# Patient Record
Sex: Female | Born: 1945 | Race: White | Hispanic: No | Smoking: Never smoker
Health system: Southern US, Community
[De-identification: ages and names within clinical notes are randomized; demographics above are authoritative.]

## PROBLEM LIST (undated history)

## (undated) DIAGNOSIS — Z8719 Personal history of other diseases of the digestive system: Secondary | ICD-10-CM

## (undated) DIAGNOSIS — C801 Malignant (primary) neoplasm, unspecified: Secondary | ICD-10-CM

## (undated) DIAGNOSIS — R011 Cardiac murmur, unspecified: Secondary | ICD-10-CM

## (undated) DIAGNOSIS — F329 Major depressive disorder, single episode, unspecified: Secondary | ICD-10-CM

## (undated) DIAGNOSIS — I10 Essential (primary) hypertension: Secondary | ICD-10-CM

## (undated) DIAGNOSIS — F419 Anxiety disorder, unspecified: Secondary | ICD-10-CM

## (undated) DIAGNOSIS — M1990 Unspecified osteoarthritis, unspecified site: Secondary | ICD-10-CM

## (undated) DIAGNOSIS — F32A Depression, unspecified: Secondary | ICD-10-CM

## (undated) DIAGNOSIS — I890 Lymphedema, not elsewhere classified: Secondary | ICD-10-CM

## (undated) DIAGNOSIS — R609 Edema, unspecified: Secondary | ICD-10-CM

## (undated) DIAGNOSIS — Z22322 Carrier or suspected carrier of Methicillin resistant Staphylococcus aureus: Secondary | ICD-10-CM

## (undated) DIAGNOSIS — K219 Gastro-esophageal reflux disease without esophagitis: Secondary | ICD-10-CM

## (undated) HISTORY — DX: Essential (primary) hypertension: I10

## (undated) HISTORY — PX: JOINT REPLACEMENT: SHX530

## (undated) HISTORY — DX: Anxiety disorder, unspecified: F41.9

## (undated) HISTORY — PX: HERNIA REPAIR: SHX51

## (undated) HISTORY — PX: TUBAL LIGATION: SHX77

## (undated) HISTORY — PX: KIDNEY SURGERY: SHX687

## (undated) HISTORY — PX: BACK SURGERY: SHX140

## (undated) HISTORY — DX: Lymphedema, not elsewhere classified: I89.0

## (undated) HISTORY — PX: CHOLECYSTECTOMY: SHX55

## (undated) HISTORY — PX: TONSILLECTOMY: SUR1361

## (undated) HISTORY — PX: OVARIAN CYST SURGERY: SHX726

## (undated) HISTORY — PX: ABDOMINAL HYSTERECTOMY: SHX81

## (undated) HISTORY — PX: OTHER SURGICAL HISTORY: SHX169

## (undated) HISTORY — PX: COLONOSCOPY: SHX174

## (undated) HISTORY — PX: FRACTURE SURGERY: SHX138

## (undated) HISTORY — PX: APPENDECTOMY: SHX54

---

## 1998-05-07 ENCOUNTER — Ambulatory Visit: Admission: RE | Admit: 1998-05-07 | Discharge: 1998-05-07 | Payer: Self-pay | Admitting: Family Medicine

## 1999-05-06 ENCOUNTER — Encounter: Payer: Self-pay | Admitting: Family Medicine

## 1999-05-06 ENCOUNTER — Ambulatory Visit (HOSPITAL_COMMUNITY): Admission: RE | Admit: 1999-05-06 | Discharge: 1999-05-06 | Payer: Self-pay | Admitting: Family Medicine

## 2000-05-25 ENCOUNTER — Ambulatory Visit (HOSPITAL_COMMUNITY): Admission: RE | Admit: 2000-05-25 | Discharge: 2000-05-25 | Payer: Self-pay | Admitting: Family Medicine

## 2000-05-25 ENCOUNTER — Encounter: Payer: Self-pay | Admitting: Family Medicine

## 2001-05-31 ENCOUNTER — Encounter: Payer: Self-pay | Admitting: Family Medicine

## 2001-05-31 ENCOUNTER — Ambulatory Visit (HOSPITAL_COMMUNITY): Admission: RE | Admit: 2001-05-31 | Discharge: 2001-05-31 | Payer: Self-pay | Admitting: Family Medicine

## 2002-06-09 ENCOUNTER — Ambulatory Visit (HOSPITAL_COMMUNITY): Admission: RE | Admit: 2002-06-09 | Discharge: 2002-06-09 | Payer: Self-pay | Admitting: Family Medicine

## 2002-06-09 ENCOUNTER — Encounter: Payer: Self-pay | Admitting: Family Medicine

## 2002-06-18 ENCOUNTER — Encounter: Payer: Self-pay | Admitting: Family Medicine

## 2002-06-18 ENCOUNTER — Encounter: Admission: RE | Admit: 2002-06-18 | Discharge: 2002-06-18 | Payer: Self-pay | Admitting: Family Medicine

## 2003-05-27 ENCOUNTER — Ambulatory Visit (HOSPITAL_COMMUNITY): Admission: RE | Admit: 2003-05-27 | Discharge: 2003-05-27 | Payer: Self-pay | Admitting: Family Medicine

## 2003-05-27 ENCOUNTER — Encounter: Payer: Self-pay | Admitting: Family Medicine

## 2004-08-24 ENCOUNTER — Ambulatory Visit (HOSPITAL_COMMUNITY): Admission: RE | Admit: 2004-08-24 | Discharge: 2004-08-24 | Payer: Self-pay | Admitting: Family Medicine

## 2004-09-08 ENCOUNTER — Ambulatory Visit: Payer: Self-pay | Admitting: Internal Medicine

## 2005-09-13 ENCOUNTER — Ambulatory Visit (HOSPITAL_COMMUNITY): Admission: RE | Admit: 2005-09-13 | Discharge: 2005-09-13 | Payer: Self-pay | Admitting: Family Medicine

## 2006-09-14 ENCOUNTER — Ambulatory Visit (HOSPITAL_COMMUNITY): Admission: RE | Admit: 2006-09-14 | Discharge: 2006-09-14 | Payer: Self-pay | Admitting: Family Medicine

## 2007-05-11 ENCOUNTER — Inpatient Hospital Stay: Payer: Self-pay | Admitting: Internal Medicine

## 2007-05-11 ENCOUNTER — Ambulatory Visit: Payer: Self-pay | Admitting: Internal Medicine

## 2007-05-11 ENCOUNTER — Other Ambulatory Visit: Payer: Self-pay

## 2007-05-12 ENCOUNTER — Other Ambulatory Visit: Payer: Self-pay

## 2007-09-19 ENCOUNTER — Ambulatory Visit (HOSPITAL_COMMUNITY): Admission: RE | Admit: 2007-09-19 | Discharge: 2007-09-19 | Payer: Self-pay | Admitting: Family Medicine

## 2007-10-04 ENCOUNTER — Encounter: Admission: RE | Admit: 2007-10-04 | Discharge: 2007-10-04 | Payer: Self-pay | Admitting: Family Medicine

## 2008-06-02 ENCOUNTER — Emergency Department: Payer: Self-pay | Admitting: Emergency Medicine

## 2008-10-27 ENCOUNTER — Ambulatory Visit (HOSPITAL_COMMUNITY): Admission: RE | Admit: 2008-10-27 | Discharge: 2008-10-27 | Payer: Self-pay | Admitting: Family Medicine

## 2008-11-04 ENCOUNTER — Encounter: Admission: RE | Admit: 2008-11-04 | Discharge: 2008-11-04 | Payer: Self-pay | Admitting: Family Medicine

## 2009-11-19 ENCOUNTER — Encounter: Admission: RE | Admit: 2009-11-19 | Discharge: 2009-11-19 | Payer: Self-pay | Admitting: Family Medicine

## 2010-10-22 ENCOUNTER — Other Ambulatory Visit: Payer: Self-pay | Admitting: *Deleted

## 2010-10-22 DIAGNOSIS — Z1239 Encounter for other screening for malignant neoplasm of breast: Secondary | ICD-10-CM

## 2010-10-23 ENCOUNTER — Encounter: Payer: Self-pay | Admitting: Family Medicine

## 2010-11-22 ENCOUNTER — Ambulatory Visit
Admission: RE | Admit: 2010-11-22 | Discharge: 2010-11-22 | Disposition: A | Discharge status: Home / Self Care | Payer: 59 | Source: Ambulatory Visit | Attending: *Deleted | Admitting: *Deleted

## 2010-11-22 DIAGNOSIS — Z1239 Encounter for other screening for malignant neoplasm of breast: Secondary | ICD-10-CM

## 2011-05-03 ENCOUNTER — Emergency Department: Payer: Self-pay | Admitting: *Deleted

## 2011-10-16 ENCOUNTER — Other Ambulatory Visit: Payer: Self-pay | Admitting: Family Medicine

## 2011-10-16 DIAGNOSIS — Z1231 Encounter for screening mammogram for malignant neoplasm of breast: Secondary | ICD-10-CM

## 2011-11-24 ENCOUNTER — Ambulatory Visit: Payer: 59

## 2011-12-07 ENCOUNTER — Ambulatory Visit
Admission: RE | Admit: 2011-12-07 | Discharge: 2011-12-07 | Disposition: A | Discharge status: Home / Self Care | Payer: 59 | Source: Ambulatory Visit | Attending: Family Medicine | Admitting: Family Medicine

## 2011-12-07 DIAGNOSIS — Z1231 Encounter for screening mammogram for malignant neoplasm of breast: Secondary | ICD-10-CM

## 2012-05-10 DIAGNOSIS — T148XXA Other injury of unspecified body region, initial encounter: Secondary | ICD-10-CM | POA: Diagnosis not present

## 2012-06-14 DIAGNOSIS — L02219 Cutaneous abscess of trunk, unspecified: Secondary | ICD-10-CM | POA: Diagnosis not present

## 2012-06-18 DIAGNOSIS — I10 Essential (primary) hypertension: Secondary | ICD-10-CM | POA: Diagnosis not present

## 2012-06-18 DIAGNOSIS — L02219 Cutaneous abscess of trunk, unspecified: Secondary | ICD-10-CM | POA: Diagnosis not present

## 2012-06-18 DIAGNOSIS — L03319 Cellulitis of trunk, unspecified: Secondary | ICD-10-CM | POA: Diagnosis not present

## 2012-06-21 ENCOUNTER — Encounter: Payer: Self-pay | Admitting: Nurse Practitioner

## 2012-06-21 ENCOUNTER — Encounter: Payer: Self-pay | Admitting: Cardiothoracic Surgery

## 2012-06-21 DIAGNOSIS — S31809A Unspecified open wound of unspecified buttock, initial encounter: Secondary | ICD-10-CM | POA: Diagnosis not present

## 2012-06-21 DIAGNOSIS — I10 Essential (primary) hypertension: Secondary | ICD-10-CM | POA: Diagnosis not present

## 2012-06-21 DIAGNOSIS — S31109A Unspecified open wound of abdominal wall, unspecified quadrant without penetration into peritoneal cavity, initial encounter: Secondary | ICD-10-CM | POA: Diagnosis not present

## 2012-06-28 DIAGNOSIS — I10 Essential (primary) hypertension: Secondary | ICD-10-CM | POA: Diagnosis not present

## 2012-06-28 DIAGNOSIS — S31109A Unspecified open wound of abdominal wall, unspecified quadrant without penetration into peritoneal cavity, initial encounter: Secondary | ICD-10-CM | POA: Diagnosis not present

## 2012-06-28 DIAGNOSIS — S31809A Unspecified open wound of unspecified buttock, initial encounter: Secondary | ICD-10-CM | POA: Diagnosis not present

## 2012-07-02 ENCOUNTER — Encounter: Payer: Self-pay | Admitting: Nurse Practitioner

## 2012-07-02 ENCOUNTER — Encounter: Payer: Self-pay | Admitting: Cardiothoracic Surgery

## 2012-07-02 DIAGNOSIS — S31109A Unspecified open wound of abdominal wall, unspecified quadrant without penetration into peritoneal cavity, initial encounter: Secondary | ICD-10-CM | POA: Diagnosis not present

## 2012-07-02 DIAGNOSIS — I10 Essential (primary) hypertension: Secondary | ICD-10-CM | POA: Diagnosis not present

## 2012-07-02 DIAGNOSIS — S31809A Unspecified open wound of unspecified buttock, initial encounter: Secondary | ICD-10-CM | POA: Diagnosis not present

## 2012-07-12 DIAGNOSIS — S31809A Unspecified open wound of unspecified buttock, initial encounter: Secondary | ICD-10-CM | POA: Diagnosis not present

## 2012-07-12 DIAGNOSIS — I10 Essential (primary) hypertension: Secondary | ICD-10-CM | POA: Diagnosis not present

## 2012-07-12 DIAGNOSIS — S31109A Unspecified open wound of abdominal wall, unspecified quadrant without penetration into peritoneal cavity, initial encounter: Secondary | ICD-10-CM | POA: Diagnosis not present

## 2012-07-15 DIAGNOSIS — M75100 Unspecified rotator cuff tear or rupture of unspecified shoulder, not specified as traumatic: Secondary | ICD-10-CM | POA: Diagnosis not present

## 2012-07-15 DIAGNOSIS — M75120 Complete rotator cuff tear or rupture of unspecified shoulder, not specified as traumatic: Secondary | ICD-10-CM | POA: Diagnosis not present

## 2012-07-18 ENCOUNTER — Ambulatory Visit: Payer: Self-pay | Admitting: Orthopedic Surgery

## 2012-07-18 LAB — WOUND CULTURE

## 2012-07-23 ENCOUNTER — Ambulatory Visit: Payer: Self-pay | Admitting: Orthopedic Surgery

## 2012-07-23 DIAGNOSIS — M75120 Complete rotator cuff tear or rupture of unspecified shoulder, not specified as traumatic: Secondary | ICD-10-CM | POA: Diagnosis not present

## 2012-07-23 DIAGNOSIS — S46819A Strain of other muscles, fascia and tendons at shoulder and upper arm level, unspecified arm, initial encounter: Secondary | ICD-10-CM | POA: Diagnosis not present

## 2012-07-23 DIAGNOSIS — M67919 Unspecified disorder of synovium and tendon, unspecified shoulder: Secondary | ICD-10-CM | POA: Diagnosis not present

## 2012-07-23 DIAGNOSIS — M75100 Unspecified rotator cuff tear or rupture of unspecified shoulder, not specified as traumatic: Secondary | ICD-10-CM | POA: Diagnosis not present

## 2012-07-24 DIAGNOSIS — I10 Essential (primary) hypertension: Secondary | ICD-10-CM | POA: Diagnosis not present

## 2012-07-26 DIAGNOSIS — S31109A Unspecified open wound of abdominal wall, unspecified quadrant without penetration into peritoneal cavity, initial encounter: Secondary | ICD-10-CM | POA: Diagnosis not present

## 2012-07-26 DIAGNOSIS — I10 Essential (primary) hypertension: Secondary | ICD-10-CM | POA: Diagnosis not present

## 2012-07-26 DIAGNOSIS — S31809A Unspecified open wound of unspecified buttock, initial encounter: Secondary | ICD-10-CM | POA: Diagnosis not present

## 2012-08-02 ENCOUNTER — Encounter: Payer: Self-pay | Admitting: Cardiothoracic Surgery

## 2012-08-02 ENCOUNTER — Encounter: Payer: Self-pay | Admitting: Nurse Practitioner

## 2012-08-02 DIAGNOSIS — I10 Essential (primary) hypertension: Secondary | ICD-10-CM | POA: Diagnosis not present

## 2012-08-02 DIAGNOSIS — S31809A Unspecified open wound of unspecified buttock, initial encounter: Secondary | ICD-10-CM | POA: Diagnosis not present

## 2012-08-02 DIAGNOSIS — S31109A Unspecified open wound of abdominal wall, unspecified quadrant without penetration into peritoneal cavity, initial encounter: Secondary | ICD-10-CM | POA: Diagnosis not present

## 2012-08-05 DIAGNOSIS — M5020 Other cervical disc displacement, unspecified cervical region: Secondary | ICD-10-CM | POA: Diagnosis not present

## 2012-08-05 DIAGNOSIS — M25559 Pain in unspecified hip: Secondary | ICD-10-CM | POA: Diagnosis not present

## 2012-08-05 DIAGNOSIS — M545 Low back pain: Secondary | ICD-10-CM | POA: Diagnosis not present

## 2012-08-09 DIAGNOSIS — S31809A Unspecified open wound of unspecified buttock, initial encounter: Secondary | ICD-10-CM | POA: Diagnosis not present

## 2012-08-09 DIAGNOSIS — S31109A Unspecified open wound of abdominal wall, unspecified quadrant without penetration into peritoneal cavity, initial encounter: Secondary | ICD-10-CM | POA: Diagnosis not present

## 2012-08-09 DIAGNOSIS — I10 Essential (primary) hypertension: Secondary | ICD-10-CM | POA: Diagnosis not present

## 2012-08-23 DIAGNOSIS — I10 Essential (primary) hypertension: Secondary | ICD-10-CM | POA: Diagnosis not present

## 2012-08-23 DIAGNOSIS — S31809A Unspecified open wound of unspecified buttock, initial encounter: Secondary | ICD-10-CM | POA: Diagnosis not present

## 2012-08-23 DIAGNOSIS — S31109A Unspecified open wound of abdominal wall, unspecified quadrant without penetration into peritoneal cavity, initial encounter: Secondary | ICD-10-CM | POA: Diagnosis not present

## 2012-09-19 DIAGNOSIS — R209 Unspecified disturbances of skin sensation: Secondary | ICD-10-CM | POA: Diagnosis not present

## 2012-09-19 DIAGNOSIS — B079 Viral wart, unspecified: Secondary | ICD-10-CM | POA: Diagnosis not present

## 2012-10-02 HISTORY — PX: ROTATOR CUFF REPAIR: SHX139

## 2012-10-23 ENCOUNTER — Ambulatory Visit: Payer: Self-pay | Admitting: Orthopedic Surgery

## 2012-10-23 DIAGNOSIS — Z01812 Encounter for preprocedural laboratory examination: Secondary | ICD-10-CM | POA: Diagnosis not present

## 2012-10-23 DIAGNOSIS — M79609 Pain in unspecified limb: Secondary | ICD-10-CM | POA: Diagnosis not present

## 2012-10-23 DIAGNOSIS — Z01810 Encounter for preprocedural cardiovascular examination: Secondary | ICD-10-CM | POA: Diagnosis not present

## 2012-10-23 DIAGNOSIS — S43429A Sprain of unspecified rotator cuff capsule, initial encounter: Secondary | ICD-10-CM | POA: Diagnosis not present

## 2012-10-23 LAB — PROTIME-INR: Prothrombin Time: 14.4 secs (ref 11.5–14.7)

## 2012-10-23 LAB — BASIC METABOLIC PANEL
Anion Gap: 5 — ABNORMAL LOW (ref 7–16)
Co2: 32 mmol/L (ref 21–32)
Creatinine: 0.81 mg/dL (ref 0.60–1.30)
EGFR (African American): >60
EGFR (Non-African Amer.): >60
Glucose: 101 mg/dL — ABNORMAL HIGH (ref 65–99)
Potassium: 3.9 mmol/L (ref 3.5–5.1)
Sodium: 138 mmol/L (ref 136–145)

## 2012-10-23 LAB — CBC
HCT: 40.6 % (ref 35.0–47.0)
HGB: 13.7 g/dL (ref 12.0–16.0)
MCH: 30.1 pg (ref 26.0–34.0)
MCHC: 33.8 g/dL (ref 32.0–36.0)
Platelet: 227 10*3/uL (ref 150–440)
RBC: 4.55 10*6/uL (ref 3.80–5.20)
WBC: 7.8 10*3/uL (ref 3.6–11.0)

## 2012-10-31 ENCOUNTER — Ambulatory Visit: Payer: Self-pay | Admitting: Orthopedic Surgery

## 2012-10-31 DIAGNOSIS — Z9109 Other allergy status, other than to drugs and biological substances: Secondary | ICD-10-CM | POA: Diagnosis not present

## 2012-10-31 DIAGNOSIS — Z79899 Other long term (current) drug therapy: Secondary | ICD-10-CM | POA: Diagnosis not present

## 2012-10-31 DIAGNOSIS — M1990 Unspecified osteoarthritis, unspecified site: Secondary | ICD-10-CM | POA: Diagnosis not present

## 2012-10-31 DIAGNOSIS — I10 Essential (primary) hypertension: Secondary | ICD-10-CM | POA: Diagnosis not present

## 2012-10-31 DIAGNOSIS — Z85828 Personal history of other malignant neoplasm of skin: Secondary | ICD-10-CM | POA: Diagnosis not present

## 2012-10-31 DIAGNOSIS — E669 Obesity, unspecified: Secondary | ICD-10-CM | POA: Diagnosis not present

## 2012-10-31 DIAGNOSIS — M719 Bursopathy, unspecified: Secondary | ICD-10-CM | POA: Diagnosis not present

## 2012-10-31 DIAGNOSIS — M67919 Unspecified disorder of synovium and tendon, unspecified shoulder: Secondary | ICD-10-CM | POA: Diagnosis not present

## 2012-10-31 DIAGNOSIS — Z91041 Radiographic dye allergy status: Secondary | ICD-10-CM | POA: Diagnosis not present

## 2012-10-31 DIAGNOSIS — M19019 Primary osteoarthritis, unspecified shoulder: Secondary | ICD-10-CM | POA: Diagnosis not present

## 2012-10-31 DIAGNOSIS — M25519 Pain in unspecified shoulder: Secondary | ICD-10-CM | POA: Diagnosis not present

## 2012-10-31 DIAGNOSIS — S43429A Sprain of unspecified rotator cuff capsule, initial encounter: Secondary | ICD-10-CM | POA: Diagnosis not present

## 2012-10-31 DIAGNOSIS — Z885 Allergy status to narcotic agent status: Secondary | ICD-10-CM | POA: Diagnosis not present

## 2012-10-31 DIAGNOSIS — M249 Joint derangement, unspecified: Secondary | ICD-10-CM | POA: Diagnosis not present

## 2012-10-31 DIAGNOSIS — G8918 Other acute postprocedural pain: Secondary | ICD-10-CM | POA: Diagnosis not present

## 2012-10-31 DIAGNOSIS — K219 Gastro-esophageal reflux disease without esophagitis: Secondary | ICD-10-CM | POA: Diagnosis not present

## 2012-10-31 DIAGNOSIS — Z7982 Long term (current) use of aspirin: Secondary | ICD-10-CM | POA: Diagnosis not present

## 2012-12-23 ENCOUNTER — Other Ambulatory Visit (HOSPITAL_COMMUNITY): Payer: Self-pay | Admitting: Family Medicine

## 2012-12-23 ENCOUNTER — Other Ambulatory Visit: Payer: Self-pay

## 2012-12-23 DIAGNOSIS — Z1231 Encounter for screening mammogram for malignant neoplasm of breast: Secondary | ICD-10-CM

## 2013-01-15 ENCOUNTER — Ambulatory Visit
Admission: RE | Admit: 2013-01-15 | Discharge: 2013-01-15 | Disposition: A | Discharge status: Home / Self Care | Payer: 59 | Source: Ambulatory Visit

## 2013-01-15 DIAGNOSIS — Z1231 Encounter for screening mammogram for malignant neoplasm of breast: Secondary | ICD-10-CM

## 2013-05-05 DIAGNOSIS — M79609 Pain in unspecified limb: Secondary | ICD-10-CM | POA: Diagnosis not present

## 2013-05-05 DIAGNOSIS — M7989 Other specified soft tissue disorders: Secondary | ICD-10-CM | POA: Diagnosis not present

## 2013-05-05 DIAGNOSIS — I890 Lymphedema, not elsewhere classified: Secondary | ICD-10-CM | POA: Diagnosis not present

## 2013-06-19 DIAGNOSIS — H2510 Age-related nuclear cataract, unspecified eye: Secondary | ICD-10-CM | POA: Diagnosis not present

## 2013-11-26 DIAGNOSIS — I10 Essential (primary) hypertension: Secondary | ICD-10-CM | POA: Diagnosis not present

## 2013-12-08 ENCOUNTER — Other Ambulatory Visit: Payer: Self-pay

## 2013-12-08 DIAGNOSIS — Z1231 Encounter for screening mammogram for malignant neoplasm of breast: Secondary | ICD-10-CM

## 2014-01-16 ENCOUNTER — Ambulatory Visit
Admission: RE | Admit: 2014-01-16 | Discharge: 2014-01-16 | Disposition: A | Discharge status: Home / Self Care | Payer: 59 | Source: Ambulatory Visit

## 2014-01-16 DIAGNOSIS — Z1231 Encounter for screening mammogram for malignant neoplasm of breast: Secondary | ICD-10-CM

## 2014-01-22 DIAGNOSIS — B079 Viral wart, unspecified: Secondary | ICD-10-CM | POA: Diagnosis not present

## 2014-01-22 DIAGNOSIS — D485 Neoplasm of uncertain behavior of skin: Secondary | ICD-10-CM | POA: Diagnosis not present

## 2014-01-22 DIAGNOSIS — L538 Other specified erythematous conditions: Secondary | ICD-10-CM | POA: Diagnosis not present

## 2014-01-22 DIAGNOSIS — Z85828 Personal history of other malignant neoplasm of skin: Secondary | ICD-10-CM | POA: Diagnosis not present

## 2014-01-22 DIAGNOSIS — L820 Inflamed seborrheic keratosis: Secondary | ICD-10-CM | POA: Diagnosis not present

## 2014-01-22 DIAGNOSIS — D179 Benign lipomatous neoplasm, unspecified: Secondary | ICD-10-CM | POA: Diagnosis not present

## 2014-01-22 DIAGNOSIS — L259 Unspecified contact dermatitis, unspecified cause: Secondary | ICD-10-CM | POA: Diagnosis not present

## 2014-02-17 DIAGNOSIS — L723 Sebaceous cyst: Secondary | ICD-10-CM | POA: Diagnosis not present

## 2014-02-17 DIAGNOSIS — C4441 Basal cell carcinoma of skin of scalp and neck: Secondary | ICD-10-CM | POA: Diagnosis not present

## 2014-03-03 DIAGNOSIS — Z0000 Encounter for general adult medical examination without abnormal findings: Secondary | ICD-10-CM | POA: Diagnosis not present

## 2014-03-03 DIAGNOSIS — E7800 Pure hypercholesterolemia, unspecified: Secondary | ICD-10-CM | POA: Diagnosis not present

## 2014-03-03 DIAGNOSIS — I10 Essential (primary) hypertension: Secondary | ICD-10-CM | POA: Diagnosis not present

## 2014-03-31 ENCOUNTER — Ambulatory Visit: Payer: Self-pay | Admitting: Family Medicine

## 2014-06-16 ENCOUNTER — Ambulatory Visit: Payer: Self-pay | Admitting: Gastroenterology

## 2014-06-16 DIAGNOSIS — Z1211 Encounter for screening for malignant neoplasm of colon: Secondary | ICD-10-CM | POA: Diagnosis not present

## 2014-06-24 DIAGNOSIS — H2510 Age-related nuclear cataract, unspecified eye: Secondary | ICD-10-CM | POA: Diagnosis not present

## 2014-06-30 DIAGNOSIS — Z23 Encounter for immunization: Secondary | ICD-10-CM | POA: Diagnosis not present

## 2014-07-27 DIAGNOSIS — L820 Inflamed seborrheic keratosis: Secondary | ICD-10-CM | POA: Diagnosis not present

## 2014-07-27 DIAGNOSIS — L821 Other seborrheic keratosis: Secondary | ICD-10-CM | POA: Diagnosis not present

## 2014-07-27 DIAGNOSIS — L578 Other skin changes due to chronic exposure to nonionizing radiation: Secondary | ICD-10-CM | POA: Diagnosis not present

## 2014-07-27 DIAGNOSIS — L814 Other melanin hyperpigmentation: Secondary | ICD-10-CM | POA: Diagnosis not present

## 2014-07-27 DIAGNOSIS — Z1283 Encounter for screening for malignant neoplasm of skin: Secondary | ICD-10-CM | POA: Diagnosis not present

## 2014-07-27 DIAGNOSIS — Z85828 Personal history of other malignant neoplasm of skin: Secondary | ICD-10-CM | POA: Diagnosis not present

## 2014-07-27 DIAGNOSIS — D485 Neoplasm of uncertain behavior of skin: Secondary | ICD-10-CM | POA: Diagnosis not present

## 2014-07-27 DIAGNOSIS — D229 Melanocytic nevi, unspecified: Secondary | ICD-10-CM | POA: Diagnosis not present

## 2014-08-04 DIAGNOSIS — Z23 Encounter for immunization: Secondary | ICD-10-CM | POA: Diagnosis not present

## 2014-08-04 DIAGNOSIS — F418 Other specified anxiety disorders: Secondary | ICD-10-CM | POA: Diagnosis not present

## 2014-08-04 DIAGNOSIS — I10 Essential (primary) hypertension: Secondary | ICD-10-CM | POA: Diagnosis not present

## 2014-08-25 DIAGNOSIS — L820 Inflamed seborrheic keratosis: Secondary | ICD-10-CM | POA: Diagnosis not present

## 2014-08-25 DIAGNOSIS — L578 Other skin changes due to chronic exposure to nonionizing radiation: Secondary | ICD-10-CM | POA: Diagnosis not present

## 2014-08-25 DIAGNOSIS — L821 Other seborrheic keratosis: Secondary | ICD-10-CM | POA: Diagnosis not present

## 2014-08-25 DIAGNOSIS — C44719 Basal cell carcinoma of skin of left lower limb, including hip: Secondary | ICD-10-CM | POA: Diagnosis not present

## 2014-12-18 ENCOUNTER — Other Ambulatory Visit: Payer: Self-pay

## 2014-12-18 DIAGNOSIS — Z1231 Encounter for screening mammogram for malignant neoplasm of breast: Secondary | ICD-10-CM

## 2015-01-22 ENCOUNTER — Ambulatory Visit
Admission: RE | Admit: 2015-01-22 | Discharge: 2015-01-22 | Disposition: A | Discharge status: Home / Self Care | Payer: Medicare Other | Source: Ambulatory Visit

## 2015-01-22 DIAGNOSIS — Z1231 Encounter for screening mammogram for malignant neoplasm of breast: Secondary | ICD-10-CM

## 2015-01-27 DIAGNOSIS — L720 Epidermal cyst: Secondary | ICD-10-CM | POA: Diagnosis not present

## 2015-01-27 DIAGNOSIS — D485 Neoplasm of uncertain behavior of skin: Secondary | ICD-10-CM | POA: Diagnosis not present

## 2015-01-27 DIAGNOSIS — L820 Inflamed seborrheic keratosis: Secondary | ICD-10-CM | POA: Diagnosis not present

## 2015-01-27 DIAGNOSIS — L821 Other seborrheic keratosis: Secondary | ICD-10-CM | POA: Diagnosis not present

## 2015-02-16 DIAGNOSIS — J069 Acute upper respiratory infection, unspecified: Secondary | ICD-10-CM | POA: Diagnosis not present

## 2015-02-16 DIAGNOSIS — H109 Unspecified conjunctivitis: Secondary | ICD-10-CM | POA: Diagnosis not present

## 2015-03-05 ENCOUNTER — Other Ambulatory Visit: Payer: Self-pay | Admitting: Family Medicine

## 2015-03-05 DIAGNOSIS — I10 Essential (primary) hypertension: Secondary | ICD-10-CM

## 2015-03-08 ENCOUNTER — Encounter: Payer: Self-pay | Admitting: Family Medicine

## 2015-03-29 ENCOUNTER — Ambulatory Visit (INDEPENDENT_AMBULATORY_CARE_PROVIDER_SITE_OTHER): Payer: Medicare Other | Admitting: Family Medicine

## 2015-03-29 ENCOUNTER — Encounter: Payer: Self-pay | Admitting: Family Medicine

## 2015-03-29 VITALS — BP 138/85 | HR 80 | Temp 98.2°F | Ht 62.2 in | Wt 268.0 lb

## 2015-03-29 DIAGNOSIS — Z6841 Body Mass Index (BMI) 40.0 and over, adult: Secondary | ICD-10-CM

## 2015-03-29 DIAGNOSIS — M79642 Pain in left hand: Secondary | ICD-10-CM | POA: Diagnosis not present

## 2015-03-29 DIAGNOSIS — E7800 Pure hypercholesterolemia, unspecified: Secondary | ICD-10-CM

## 2015-03-29 DIAGNOSIS — M79641 Pain in right hand: Secondary | ICD-10-CM

## 2015-03-29 DIAGNOSIS — I10 Essential (primary) hypertension: Secondary | ICD-10-CM

## 2015-03-29 DIAGNOSIS — Z0000 Encounter for general adult medical examination without abnormal findings: Secondary | ICD-10-CM | POA: Diagnosis not present

## 2015-03-29 DIAGNOSIS — R350 Frequency of micturition: Secondary | ICD-10-CM

## 2015-03-29 DIAGNOSIS — M25541 Pain in joints of right hand: Secondary | ICD-10-CM

## 2015-03-29 DIAGNOSIS — M25542 Pain in joints of left hand: Secondary | ICD-10-CM

## 2015-03-29 LAB — URINALYSIS, ROUTINE W REFLEX MICROSCOPIC
BILIRUBIN UA: NEGATIVE
Glucose, UA: NEGATIVE
Ketones, UA: NEGATIVE
Nitrite, UA: NEGATIVE
PROTEIN UA: NEGATIVE
SPEC GRAV UA: 1.02 (ref 1.005–1.030)
Urobilinogen, Ur: 0.2 mg/dL (ref 0.2–1.0)
pH, UA: 5.5 (ref 5.0–7.5)

## 2015-03-29 LAB — MICROSCOPIC EXAMINATION

## 2015-03-29 MED ORDER — SULFAMETHOXAZOLE-TRIMETHOPRIM 400-80 MG PO TABS: 1.0000 | ORAL_TABLET | Freq: Two times a day (BID) | ORAL | Status: DC

## 2015-03-29 MED ORDER — OLMESARTAN-AMLODIPINE-HCTZ 40-5-25 MG PO TABS: 1.0000 | ORAL_TABLET | Freq: Every day | ORAL | Status: DC

## 2015-03-29 MED ORDER — TRAZODONE HCL 50 MG PO TABS: 50.0000 mg | ORAL_TABLET | Freq: Every day | ORAL | Status: DC

## 2015-03-29 MED ORDER — MELOXICAM 15 MG PO TABS: 15.0000 mg | ORAL_TABLET | Freq: Every day | ORAL | Status: DC | PRN

## 2015-03-29 MED ORDER — TRAMADOL HCL 50 MG PO TABS: 50.0000 mg | ORAL_TABLET | Freq: Four times a day (QID) | ORAL | Status: DC | PRN

## 2015-03-29 MED ORDER — OMEPRAZOLE 40 MG PO CPDR: 40.0000 mg | DELAYED_RELEASE_CAPSULE | Freq: Every day | ORAL | Status: DC

## 2015-03-29 MED ORDER — LORAZEPAM 1 MG PO TABS: 1.0000 mg | ORAL_TABLET | Freq: Every day | ORAL | Status: DC | PRN

## 2015-03-29 NOTE — Assessment & Plan Note (Signed)
The current medical regimen is effective;  continue present plan and medications.  

## 2015-03-29 NOTE — Assessment & Plan Note (Signed)
discussed use of tramadol and care

## 2015-03-29 NOTE — Progress Notes (Signed)
BP 138/85 mmHg  Pulse 80  Temp(Src) 98.2 F (36.8 C)  Ht 5' 2.2" (1.58 m)  Wt 268 lb (121.564 kg)  BMI 48.70 kg/m2  SpO2 97%   Subjective:    Patient ID: Tracy Espinoza, female    DOB: May 23, 1946, 69 y.o.   MRN: [id number]  HPI: Tracy Espinoza is a 69 y.o. female  Chief Complaint  Patient presents with  . Annual Exam  . Urinary Tract Infection   Took cipro but still sx of UTI   Relevant past medical, surgical, family and social history reviewed and updated as indicated. Interim medical history since our last visit reviewed. Allergies and medications reviewed and updated. BP doing well Has retired No problems with meds Takes every day Lt leg still swelloing  Review of Systems  Constitutional: Negative.   HENT: Negative.   Eyes: Negative.   Respiratory: Negative.   Cardiovascular: Negative.   Gastrointestinal: Negative.   Endocrine: Negative.   Genitourinary: Negative.   Musculoskeletal: Negative.   Skin: Negative.   Allergic/Immunologic: Negative.   Neurological: Negative.   Hematological: Negative.   Psychiatric/Behavioral: Negative.     Per HPI unless specifically indicated above     Objective:    BP 138/85 mmHg  Pulse 80  Temp(Src) 98.2 F (36.8 C)  Ht 5' 2.2" (1.58 m)  Wt 268 lb (121.564 kg)  BMI 48.70 kg/m2  SpO2 97%  Wt Readings from Last 3 Encounters:  03/29/15 268 lb (121.564 kg)  02/16/15 266 lb (120.657 kg)  08/04/14 254 lb (115.214 kg)    Physical Exam  Constitutional: She is oriented to person, place, and time. She appears well-developed and well-nourished.  HENT:  Head: Normocephalic and atraumatic.  Right Ear: External ear normal.  Left Ear: External ear normal.  Nose: Nose normal.  Mouth/Throat: Oropharynx is clear and moist.  Eyes: Conjunctivae and EOM are normal. Pupils are equal, round, and reactive to light.  Neck: Normal range of motion. Neck supple. Carotid bruit is not present.  Cardiovascular: Normal rate, regular  rhythm and normal heart sounds.   No murmur heard. Pulmonary/Chest: Effort normal and breath sounds normal.  Abdominal: Soft. Bowel sounds are normal. There is no hepatosplenomegaly.  Musculoskeletal: Normal range of motion.  Neurological: She is alert and oriented to person, place, and time.  Skin: No rash noted.  Psychiatric: She has a normal mood and affect. Her behavior is normal. Judgment and thought content normal.        Assessment & Plan:   Problem List Items Addressed This Visit      Cardiovascular and Mediastinum   Hypertension    The current medical regimen is effective;  continue present plan and medications.       Relevant Medications   Olmesartan-Amlodipine-HCTZ (TRIBENZOR) 40-5-25 MG TABS   Other Relevant Orders   TSH     Other   Hypercholesteremia    The current medical regimen is effective;  continue present plan and medications.       Relevant Medications   Olmesartan-Amlodipine-HCTZ (TRIBENZOR) 40-5-25 MG TABS   Other Relevant Orders   Lipid panel   BMI 45.0-49.9, adult    Discuss diet and exercise  Wt loss      Arthralgia of hands, bilateral    discussed use of tramadol and care      Relevant Medications   traMADol (ULTRAM) 50 MG tablet    Other Visit Diagnoses    Frequency of urination    -  Primary  Relevant Orders    Comprehensive metabolic panel    CBC with Differential/Platelet    Urinalysis, Routine w reflex microscopic (not at [facility])    PE (physical exam), annual        Relevant Orders    Comprehensive metabolic panel    CBC with Differential/Platelet    Urinalysis, Routine w reflex microscopic (not at [facility])    TSH    Lipid panel    Benign hypertension        Relevant Medications    Olmesartan-Amlodipine-HCTZ (TRIBENZOR) 40-5-25 MG TABS        Follow up plan: Return in about 6 months (around 09/28/2015) for bmp BP check.

## 2015-03-29 NOTE — Assessment & Plan Note (Signed)
Discuss diet and exercise  Wt loss

## 2015-03-30 LAB — LIPID PANEL
Chol/HDL Ratio: 3.6 ratio units (ref 0.0–4.4)
Cholesterol, Total: 199 mg/dL (ref 100–199)
HDL: 56 mg/dL (ref >39)
LDL Calculated: 99 mg/dL (ref 0–99)
Triglycerides: 219 mg/dL — ABNORMAL HIGH (ref 0–149)
VLDL Cholesterol Cal: 44 mg/dL — ABNORMAL HIGH (ref 5–40)

## 2015-03-30 LAB — COMPREHENSIVE METABOLIC PANEL
A/G RATIO: 1.7 (ref 1.1–2.5)
ALK PHOS: 46 IU/L (ref 39–117)
ALT: 21 IU/L (ref 0–32)
AST: 21 IU/L (ref 0–40)
Albumin: 4.4 g/dL (ref 3.5–4.8)
BILIRUBIN TOTAL: 0.6 mg/dL (ref 0.0–1.2)
BUN/Creatinine Ratio: 37 — ABNORMAL HIGH (ref 11–26)
BUN: 29 mg/dL — ABNORMAL HIGH (ref 8–27)
CO2: 24 mmol/L (ref 18–29)
Calcium: 9.9 mg/dL (ref 8.7–10.3)
Chloride: 98 mmol/L (ref 97–108)
Creatinine, Ser: 0.79 mg/dL (ref 0.57–1.00)
GFR calc Af Amer: 88 mL/min/{1.73_m2} (ref >59)
GFR, EST NON AFRICAN AMERICAN: 76 mL/min/{1.73_m2} (ref >59)
GLOBULIN, TOTAL: 2.6 g/dL (ref 1.5–4.5)
GLUCOSE: 106 mg/dL — AB (ref 65–99)
POTASSIUM: 4.9 mmol/L (ref 3.5–5.2)
SODIUM: 144 mmol/L (ref 134–144)
Total Protein: 7 g/dL (ref 6.0–8.5)

## 2015-03-30 LAB — CBC WITH DIFFERENTIAL/PLATELET
BASOS: 0 %
Basophils Absolute: 0 10*3/uL (ref 0.0–0.2)
EOS (ABSOLUTE): 0.1 10*3/uL (ref 0.0–0.4)
EOS: 1 %
Hematocrit: 43.3 % (ref 34.0–46.6)
Hemoglobin: 13.9 g/dL (ref 11.1–15.9)
Immature Grans (Abs): 0 10*3/uL (ref 0.0–0.1)
Immature Granulocytes: 0 %
LYMPHS ABS: 3.8 10*3/uL — AB (ref 0.7–3.1)
Lymphs: 36 %
MCH: 30.2 pg (ref 26.6–33.0)
MCHC: 32.1 g/dL (ref 31.5–35.7)
MCV: 94 fL (ref 79–97)
MONOCYTES: 5 %
Monocytes Absolute: 0.6 10*3/uL (ref 0.1–0.9)
NEUTROS ABS: 6 10*3/uL (ref 1.4–7.0)
Neutrophils: 58 %
Platelets: 210 10*3/uL (ref 150–379)
RBC: 4.6 x10E6/uL (ref 3.77–5.28)
RDW: 13.5 % (ref 12.3–15.4)
WBC: 10.5 10*3/uL (ref 3.4–10.8)

## 2015-03-30 LAB — TSH: TSH: 1.79 u[IU]/mL (ref 0.450–4.500)

## 2015-04-02 ENCOUNTER — Telehealth: Payer: Self-pay | Admitting: Family Medicine

## 2015-04-02 ENCOUNTER — Other Ambulatory Visit: Payer: Medicare Other

## 2015-04-02 ENCOUNTER — Other Ambulatory Visit: Payer: Self-pay

## 2015-04-02 ENCOUNTER — Telehealth: Payer: Self-pay

## 2015-04-02 ENCOUNTER — Other Ambulatory Visit: Payer: Self-pay | Admitting: Family Medicine

## 2015-04-02 DIAGNOSIS — N390 Urinary tract infection, site not specified: Secondary | ICD-10-CM | POA: Diagnosis not present

## 2015-04-02 DIAGNOSIS — R8281 Pyuria: Secondary | ICD-10-CM

## 2015-04-02 LAB — UA/M W/RFLX CULTURE, ROUTINE
Bilirubin, UA: NEGATIVE
Glucose, UA: NEGATIVE
KETONES UA: NEGATIVE
Leukocytes, UA: NEGATIVE
NITRITE UA: NEGATIVE
PH UA: 5.5 (ref 5.0–7.5)
Protein, UA: NEGATIVE
RBC, UA: NEGATIVE
Specific Gravity, UA: 1.02 (ref 1.005–1.030)
UUROB: 0.2 mg/dL (ref 0.2–1.0)

## 2015-04-02 NOTE — Telephone Encounter (Signed)
Patient called inquiring about urine culture results.  Culture was not run for unknown reason.  Patient is still having symptoms. Per MJ , patient coming in to leave another sample for culture.

## 2015-04-02 NOTE — Telephone Encounter (Signed)
Please let her know that her urine came back entirely normal. No blood. No infection. Nothing to worry about at this time.

## 2015-04-02 NOTE — Telephone Encounter (Signed)
Patient called, left vm that test was clear, to finish meds.  If Sx persist to next week call and schedule appointment as it may be yeast

## 2015-06-28 ENCOUNTER — Encounter: Payer: Self-pay | Admitting: Family Medicine

## 2015-06-28 ENCOUNTER — Ambulatory Visit (INDEPENDENT_AMBULATORY_CARE_PROVIDER_SITE_OTHER): Payer: Medicare Other | Admitting: Family Medicine

## 2015-06-28 VITALS — BP 127/83 | HR 82 | Temp 97.9°F | Wt 273.0 lb

## 2015-06-28 DIAGNOSIS — R300 Dysuria: Secondary | ICD-10-CM | POA: Diagnosis not present

## 2015-06-28 DIAGNOSIS — N3000 Acute cystitis without hematuria: Secondary | ICD-10-CM | POA: Diagnosis not present

## 2015-06-28 LAB — MICROSCOPIC EXAMINATION: Renal Epithel, UA: NONE SEEN /hpf

## 2015-06-28 MED ORDER — CIPROFLOXACIN HCL 500 MG PO TABS: 500.0000 mg | ORAL_TABLET | Freq: Two times a day (BID) | ORAL | Status: DC

## 2015-06-28 NOTE — Patient Instructions (Signed)

## 2015-06-28 NOTE — Progress Notes (Signed)
BP 127/83 mmHg  Pulse 82  Temp(Src) 97.9 F (36.6 C)  Wt 273 lb (123.832 kg)  SpO2 95%  PF 5 L/min   Subjective:    Patient ID: Tracy Espinoza, female    DOB: [birth year], 69 y.o.   MRN: [id number]  HPI: Tracy Espinoza is a 69 y.o. female  Chief Complaint  Patient presents with  . Urinary Tract Infection   URINARY SYMPTOMS- seems to have cold sweats when she gets UTI Duration: about 5 weeks Dysuria: no Urinary frequency: yes Urgency: yes Small volume voids: no Symptom severity: mild Urinary incontinence: yes Foul odor: no Hematuria: no Abdominal pain: no Back pain: no Suprapubic pain/pressure: yes Flank pain: no Fever:  no Vomiting: no Relief with cranberry juice: yes Relief with pyridium: no Status: worse Previous urinary tract infection: yes Recurrent urinary tract infection: yes Treatments attempted: cranberry and increasing fluids   Relevant past medical, surgical, family and social history reviewed and updated as indicated. Interim medical history since our last visit reviewed. Allergies and medications reviewed and updated.  Review of Systems  Constitutional: Negative.   Respiratory: Negative.   Cardiovascular: Negative.   Gastrointestinal: Negative.   Genitourinary: Negative.   Psychiatric/Behavioral: Negative.     Per HPI unless specifically indicated above     Objective:    BP 127/83 mmHg  Pulse 82  Temp(Src) 97.9 F (36.6 C)  Wt 273 lb (123.832 kg)  SpO2 95%  PF 5 L/min  Wt Readings from Last 3 Encounters:  06/28/15 273 lb (123.832 kg)  03/29/15 268 lb (121.564 kg)  02/16/15 266 lb (120.657 kg)    Physical Exam  Constitutional: She is oriented to person, place, and time. She appears well-developed and well-nourished. No distress.  HENT:  Head: Normocephalic and atraumatic.  Right Ear: Hearing normal.  Left Ear: Hearing normal.  Nose: Nose normal.  Eyes: Conjunctivae and lids are normal. Right eye exhibits no discharge. Left eye  exhibits no discharge. No scleral icterus.  Cardiovascular: Normal rate, regular rhythm, normal heart sounds and intact distal pulses.  Exam reveals no gallop and no friction rub.   No murmur heard. Pulmonary/Chest: Effort normal and breath sounds normal. No respiratory distress. She has no wheezes. She has no rales. She exhibits no tenderness.  Abdominal: Soft. Bowel sounds are normal. She exhibits no distension and no mass. There is no tenderness. There is no rebound and no guarding.  Musculoskeletal: Normal range of motion.  Neurological: She is alert and oriented to person, place, and time.  Skin: Skin is warm, dry and intact. No rash noted. No erythema. No pallor.  Psychiatric: She has a normal mood and affect. Her speech is normal and behavior is normal. Judgment and thought content normal. Cognition and memory are normal.  Nursing note and vitals reviewed.   Results for orders placed or performed in visit on 04/02/15  UA/M w/rflx Culture, Routine  Result Value Ref Range   Specific Gravity, UA 1.020 1.005 - 1.030   pH, UA 5.5 5.0 - 7.5   Color, UA Yellow Yellow   Appearance Ur Clear Clear   Leukocytes, UA Negative Negative   Protein, UA Negative Negative/Trace   Glucose, UA Negative Negative   Ketones, UA Negative Negative   RBC, UA Negative Negative   Bilirubin, UA Negative Negative   Urobilinogen, Ur 0.2 0.2 - 1.0 mg/dL   Nitrite, UA Negative Negative      Assessment & Plan:   Problem List Items Addressed This Visit  None    Visit Diagnoses    Acute cystitis without hematuria    -  Primary    UTI. Will treat x 7 days with cipro. Rx sent to her pharmacy. Will let us know if not getting better or getting worse.     Dysuria        Will check UA    Relevant Orders    UA/M w/rflx Culture, Routine        Follow up plan: Return if symptoms worsen or fail to improve.

## 2015-07-02 LAB — URINE CULTURE, REFLEX

## 2015-07-05 ENCOUNTER — Encounter: Payer: Self-pay | Admitting: Family Medicine

## 2015-07-05 LAB — UA/M W/RFLX CULTURE, ROUTINE

## 2015-07-22 DIAGNOSIS — Z23 Encounter for immunization: Secondary | ICD-10-CM | POA: Diagnosis not present

## 2015-07-28 DIAGNOSIS — H2513 Age-related nuclear cataract, bilateral: Secondary | ICD-10-CM | POA: Diagnosis not present

## 2015-07-29 DIAGNOSIS — D229 Melanocytic nevi, unspecified: Secondary | ICD-10-CM | POA: Diagnosis not present

## 2015-07-29 DIAGNOSIS — L720 Epidermal cyst: Secondary | ICD-10-CM | POA: Diagnosis not present

## 2015-07-29 DIAGNOSIS — D1800 Hemangioma unspecified site: Secondary | ICD-10-CM | POA: Diagnosis not present

## 2015-07-29 DIAGNOSIS — L578 Other skin changes due to chronic exposure to nonionizing radiation: Secondary | ICD-10-CM | POA: Diagnosis not present

## 2015-07-29 DIAGNOSIS — L820 Inflamed seborrheic keratosis: Secondary | ICD-10-CM | POA: Diagnosis not present

## 2015-07-29 DIAGNOSIS — L821 Other seborrheic keratosis: Secondary | ICD-10-CM | POA: Diagnosis not present

## 2015-07-29 DIAGNOSIS — D485 Neoplasm of uncertain behavior of skin: Secondary | ICD-10-CM | POA: Diagnosis not present

## 2015-07-29 DIAGNOSIS — L812 Freckles: Secondary | ICD-10-CM | POA: Diagnosis not present

## 2015-07-29 DIAGNOSIS — Z85828 Personal history of other malignant neoplasm of skin: Secondary | ICD-10-CM | POA: Diagnosis not present

## 2015-07-29 DIAGNOSIS — D171 Benign lipomatous neoplasm of skin and subcutaneous tissue of trunk: Secondary | ICD-10-CM | POA: Diagnosis not present

## 2015-07-29 DIAGNOSIS — L304 Erythema intertrigo: Secondary | ICD-10-CM | POA: Diagnosis not present

## 2015-07-29 DIAGNOSIS — Z1283 Encounter for screening for malignant neoplasm of skin: Secondary | ICD-10-CM | POA: Diagnosis not present

## 2015-08-06 ENCOUNTER — Encounter: Payer: Self-pay | Admitting: Unknown Physician Specialty

## 2015-08-06 ENCOUNTER — Ambulatory Visit (INDEPENDENT_AMBULATORY_CARE_PROVIDER_SITE_OTHER): Payer: Medicare Other | Admitting: Unknown Physician Specialty

## 2015-08-06 VITALS — BP 110/74 | HR 77 | Temp 97.4°F | Wt 282.6 lb

## 2015-08-06 DIAGNOSIS — N3090 Cystitis, unspecified without hematuria: Secondary | ICD-10-CM

## 2015-08-06 DIAGNOSIS — R110 Nausea: Secondary | ICD-10-CM

## 2015-08-06 DIAGNOSIS — R8290 Unspecified abnormal findings in urine: Secondary | ICD-10-CM | POA: Diagnosis not present

## 2015-08-06 LAB — MICROSCOPIC EXAMINATION
Epithelial Cells (non renal): NONE SEEN /hpf (ref 0–10)
WBC, UA: >30 /hpf — ABNORMAL HIGH (ref 0–?)

## 2015-08-06 MED ORDER — CIPROFLOXACIN HCL 250 MG PO TABS: 250.0000 mg | ORAL_TABLET | Freq: Two times a day (BID) | ORAL | Status: DC

## 2015-08-06 NOTE — Progress Notes (Signed)
   BP 110/74 mmHg  Pulse 77  Temp(Src) 97.4 F (36.3 C)  Wt 282 lb 9.6 oz (128.187 kg)  SpO2 97%   Subjective:    Patient ID: Tracy Espinoza, female    DOB: Oct 21, 1945, 69 y.o.   MRN: [id number]  HPI: Tracy Espinoza is a 69 y.o. female  Chief Complaint  Patient presents with  . Urinary Tract Infection    pt states she has taken test from pharmacy and it came back positive. States she does not feel good and is nauseated  . Hip Pain    pt states she is having pain in right hip   Pt has not UTI symptoms, but she does have a history of having a UTI when she feels bad.  I note a significant history of UTI's and positive urines  Relevant past medical, surgical, family and social history reviewed and updated as indicated. Interim medical history since our last visit reviewed. Allergies and medications reviewed and updated.  Review of Systems  Per HPI unless specifically indicated above     Objective:    BP 110/74 mmHg  Pulse 77  Temp(Src) 97.4 F (36.3 C)  Wt 282 lb 9.6 oz (128.187 kg)  SpO2 97%  Wt Readings from Last 3 Encounters:  08/06/15 282 lb 9.6 oz (128.187 kg)  06/28/15 273 lb (123.832 kg)  03/29/15 268 lb (121.564 kg)    Physical Exam  Constitutional: She is oriented to person, place, and time. She appears well-developed and well-nourished. No distress.  HENT:  Head: Normocephalic and atraumatic.  Eyes: Conjunctivae and lids are normal. Right eye exhibits no discharge. Left eye exhibits no discharge. No scleral icterus.  Cardiovascular: Normal rate, regular rhythm and normal heart sounds.   Pulmonary/Chest: Effort normal and breath sounds normal. No respiratory distress.  Abdominal: Normal appearance. There is no splenomegaly or hepatomegaly.  Musculoskeletal: Normal range of motion.  Neurological: She is alert and oriented to person, place, and time.  Skin: Skin is warm, dry and intact. No rash noted. No erythema. No pallor.  Psychiatric: She has a normal mood  and affect. Her behavior is normal. Judgment and thought content normal.   Urine is positive     Assessment & Plan:   Problem List Items Addressed This Visit    None    Visit Diagnoses    Nausea    -  Primary    Relevant Orders    UA/M w/rflx Culture, Routine    Cystitis            Follow up plan: Return for evaluation of hip.

## 2015-08-06 NOTE — Patient Instructions (Signed)

## 2015-08-09 LAB — UA/M W/RFLX CULTURE, ROUTINE

## 2015-08-09 LAB — URINE CULTURE, REFLEX

## 2015-08-13 ENCOUNTER — Ambulatory Visit (INDEPENDENT_AMBULATORY_CARE_PROVIDER_SITE_OTHER): Payer: Medicare Other | Admitting: Unknown Physician Specialty

## 2015-08-13 ENCOUNTER — Encounter: Payer: Self-pay | Admitting: Unknown Physician Specialty

## 2015-08-13 VITALS — BP 125/81 | HR 76 | Temp 97.5°F | Ht 62.0 in | Wt 280.2 lb

## 2015-08-13 DIAGNOSIS — M7071 Other bursitis of hip, right hip: Secondary | ICD-10-CM | POA: Diagnosis not present

## 2015-08-13 DIAGNOSIS — M25541 Pain in joints of right hand: Secondary | ICD-10-CM | POA: Diagnosis not present

## 2015-08-13 DIAGNOSIS — M25542 Pain in joints of left hand: Secondary | ICD-10-CM

## 2015-08-13 DIAGNOSIS — M7918 Myalgia, other site: Secondary | ICD-10-CM

## 2015-08-13 DIAGNOSIS — M791 Myalgia: Secondary | ICD-10-CM

## 2015-08-13 MED ORDER — TRAMADOL HCL 50 MG PO TABS: 50.0000 mg | ORAL_TABLET | Freq: Four times a day (QID) | ORAL | Status: DC | PRN

## 2015-08-13 NOTE — Progress Notes (Signed)
BP 125/81 mmHg  Pulse 76  Temp(Src) 97.5 F (36.4 C)  Ht 5\' 2"  (1.575 m)  Wt 280 lb 3.2 oz (127.098 kg)  BMI 51.24 kg/m2  SpO2 97%   Subjective:    Patient ID: Tracy Espinoza, female    DOB: [birth year], 69 y.o.   MRN: [id number]  HPI: Tracy Espinoza is a 69 y.o. female  Chief Complaint  Patient presents with  . Hip Pain    pt states she is having pain in right hip. States it started about a month ago.   Hip Pain  Incident onset: about a month.  No particular incident. There was no injury mechanism. The pain is present in the right hip. The quality of the pain is described as stabbing. The pain is severe. Pain course: worse with standing and walking and lying down makes it go away completely. Pertinent negatives include no loss of motion, loss of sensation, muscle weakness, numbness or tingling. The symptoms are aggravated by movement and weight bearing. She has tried rest and acetaminophen (Tramadol) for the symptoms. The treatment provided moderate (Moderate from Tramadol.  Mild from Tylenol) relief.  She does go to an exercise and balance class.    Relevant past medical, surgical, family and social history reviewed and updated as indicated. Interim medical history since our last visit reviewed. Allergies and medications reviewed and updated.  Review of Systems  Neurological: Negative for tingling and numbness.    Per HPI unless specifically indicated above     Objective:    BP 125/81 mmHg  Pulse 76  Temp(Src) 97.5 F (36.4 C)  Ht 5\' 2"  (1.575 m)  Wt 280 lb 3.2 oz (127.098 kg)  BMI 51.24 kg/m2  SpO2 97%  Wt Readings from Last 3 Encounters:  08/13/15 280 lb 3.2 oz (127.098 kg)  08/06/15 282 lb 9.6 oz (128.187 kg)  06/28/15 273 lb (123.832 kg)    Physical Exam  Constitutional: She is oriented to person, place, and time. She appears well-developed and well-nourished. No distress.  HENT:  Head: Normocephalic and atraumatic.  Eyes: Conjunctivae and lids are normal.  Right eye exhibits no discharge. Left eye exhibits no discharge. No scleral icterus.  Cardiovascular: Normal rate and regular rhythm.   Pulmonary/Chest: Effort normal. No respiratory distress.  Abdominal: Normal appearance and bowel sounds are normal. She exhibits no distension. There is no splenomegaly or hepatomegaly. There is no tenderness.  Musculoskeletal:  Tender Piriformis and right greater trochanter.    Neurological: She is alert and oriented to person, place, and time.  Skin: Skin is intact. No rash noted. No pallor.  Psychiatric: She has a normal mood and affect. Her behavior is normal. Judgment and thought content normal.      Assessment & Plan:   Problem List Items Addressed This Visit      Unprioritized   Arthralgia of hands, bilateral   Relevant Medications   traMADol (ULTRAM) 50 MG tablet    Other Visit Diagnoses    Piriformis muscle pain    -  Primary    discuused PT.  She would like to work on her home exercises she had in the past.  refer to Dr Wynetta Emery    Relevant Orders    Ambulatory referral to Orthopedic Surgery    Hip bursitis, right        Offered injection.  Pt would prefer an Orthopedic appointment.  Will refer to Dr. Marry Guan.      Relevant Orders  Ambulatory referral to Orthopedic Surgery        Follow up plan: Return for Appt with Dr. Wynetta Emery for manipulation.

## 2015-08-31 ENCOUNTER — Ambulatory Visit (INDEPENDENT_AMBULATORY_CARE_PROVIDER_SITE_OTHER): Payer: Medicare Other | Admitting: Family Medicine

## 2015-08-31 ENCOUNTER — Encounter: Payer: Self-pay | Admitting: Family Medicine

## 2015-08-31 VITALS — BP 136/74 | HR 85 | Temp 98.8°F | Ht 62.0 in | Wt 281.0 lb

## 2015-08-31 DIAGNOSIS — E7800 Pure hypercholesterolemia, unspecified: Secondary | ICD-10-CM

## 2015-08-31 DIAGNOSIS — R109 Unspecified abdominal pain: Secondary | ICD-10-CM

## 2015-08-31 DIAGNOSIS — M25542 Pain in joints of left hand: Secondary | ICD-10-CM | POA: Diagnosis not present

## 2015-08-31 DIAGNOSIS — N3000 Acute cystitis without hematuria: Secondary | ICD-10-CM | POA: Diagnosis not present

## 2015-08-31 DIAGNOSIS — M25541 Pain in joints of right hand: Secondary | ICD-10-CM

## 2015-08-31 DIAGNOSIS — I10 Essential (primary) hypertension: Secondary | ICD-10-CM | POA: Diagnosis not present

## 2015-08-31 DIAGNOSIS — N390 Urinary tract infection, site not specified: Secondary | ICD-10-CM | POA: Insufficient documentation

## 2015-08-31 LAB — URINALYSIS, ROUTINE W REFLEX MICROSCOPIC
BILIRUBIN UA: NEGATIVE
GLUCOSE, UA: NEGATIVE
Ketones, UA: NEGATIVE
NITRITE UA: NEGATIVE
PH UA: 5.5 (ref 5.0–7.5)
PROTEIN UA: NEGATIVE
Specific Gravity, UA: 1.015 (ref 1.005–1.030)
UUROB: 0.2 mg/dL (ref 0.2–1.0)

## 2015-08-31 LAB — MICROSCOPIC EXAMINATION

## 2015-08-31 MED ORDER — LORAZEPAM 1 MG PO TABS: 1.0000 mg | ORAL_TABLET | Freq: Every day | ORAL | Status: DC | PRN

## 2015-08-31 MED ORDER — AMLODIPINE BESYLATE 5 MG PO TABS: 5.0000 mg | ORAL_TABLET | Freq: Every day | ORAL | Status: DC

## 2015-08-31 MED ORDER — AMOXICILLIN-POT CLAVULANATE 875-125 MG PO TABS: 1.0000 | ORAL_TABLET | Freq: Two times a day (BID) | ORAL | Status: DC

## 2015-08-31 MED ORDER — TRAMADOL HCL 50 MG PO TABS: 50.0000 mg | ORAL_TABLET | Freq: Four times a day (QID) | ORAL | Status: DC | PRN

## 2015-08-31 MED ORDER — HYDROCHLOROTHIAZIDE 25 MG PO TABS: 25.0000 mg | ORAL_TABLET | Freq: Every day | ORAL | Status: DC

## 2015-08-31 MED ORDER — LOSARTAN POTASSIUM 100 MG PO TABS: 100.0000 mg | ORAL_TABLET | Freq: Every day | ORAL | Status: DC

## 2015-08-31 NOTE — Assessment & Plan Note (Signed)
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

## 2015-08-31 NOTE — Progress Notes (Signed)
BP 136/74 mmHg  Pulse 85  Temp(Src) 98.8 F (37.1 C)  Ht 5\' 2"  (1.575 m)  Wt 281 lb (127.461 kg)  BMI 51.38 kg/m2  SpO2 96%   Subjective:    Patient ID: Tracy Espinoza, female    DOB: 06-12-1946, 70 y.o.   MRN: [id number]  HPI: Tracy Espinoza is a 69 y.o. female  Chief Complaint  Patient presents with  . Urinary Tract Infection    with complaints of UTI showing on home testing Patient also having symptoms of UTI with frequency urgency dysuria Patient's been treated for UTI 2 other times this fall with complete resolution of symptoms with Cipro first time 500 twice a day second time 250 twice a day Culture and sensitivity both positive for greater than 100,000 Escherichia coli sensitive to Cipro Patient took medication faithfully and completed course resolution of symptoms Patient with no change in soaps detergents activities etc.  Patient also follow-up hypertension hypercholesterol doing well with no complaints Taking medication faithfully without problems Relevant past medical, surgical, family and social history reviewed and updated as indicated. Interim medical history since our last visit reviewed. Allergies and medications reviewed and updated.  Review of Systems  Constitutional: Negative.   Respiratory: Negative.   Cardiovascular: Negative.     Per HPI unless specifically indicated above     Objective:    BP 136/74 mmHg  Pulse 85  Temp(Src) 98.8 F (37.1 C)  Ht 5\' 2"  (1.575 m)  Wt 281 lb (127.461 kg)  BMI 51.38 kg/m2  SpO2 96%  Wt Readings from Last 3 Encounters:  08/31/15 281 lb (127.461 kg)  08/13/15 280 lb 3.2 oz (127.098 kg)  08/06/15 282 lb 9.6 oz (128.187 kg)    Physical Exam  Constitutional: She is oriented to person, place, and time. She appears well-developed and well-nourished. No distress.  HENT:  Head: Normocephalic and atraumatic.  Right Ear: Hearing normal.  Left Ear: Hearing normal.  Nose: Nose normal.  Eyes: Conjunctivae and lids  are normal. Right eye exhibits no discharge. Left eye exhibits no discharge. No scleral icterus.  Cardiovascular: Normal rate, regular rhythm and normal heart sounds.   Pulmonary/Chest: Effort normal and breath sounds normal. No respiratory distress.  Abdominal: She exhibits no distension. There is no tenderness.  Musculoskeletal: Normal range of motion.  Neurological: She is alert and oriented to person, place, and time.  Skin: Skin is intact. No rash noted.  Psychiatric: She has a normal mood and affect. Her speech is normal and behavior is normal. Judgment and thought content normal. Cognition and memory are normal.    Results for orders placed or performed in visit on 08/06/15  Microscopic Examination  Result Value Ref Range   WBC, UA >30 (H) 0 -  5 /hpf   RBC, UA 3-10 (A) 0 -  2 /hpf   Epithelial Cells (non renal) None seen 0 - 10 /hpf   Mucus, UA Present (A) Not Estab.   Bacteria, UA Many (A) None seen/Few  UA/M w/rflx Culture, Routine  Result Value Ref Range   Urine Culture, Routine Final report (A)    Urine Culture result 1 Escherichia coli (A)    RESULT 2 Comment    ANTIMICROBIAL SUSCEPTIBILITY Comment   Urine Culture, Routine  Result Value Ref Range   Urine Culture result 1 Escherichia coli (A)       Assessment & Plan:   Problem List Items Addressed This Visit      Cardiovascular and Mediastinum  Hypertension    The current medical regimen is effective;  continue present plan and medications.       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       Relevant Medications   losartan (COZAAR) 100 MG tablet   hydrochlorothiazide (HYDRODIURIL) 25 MG tablet   amLODipine (NORVASC) 5 MG tablet   Other Relevant Orders   Lipid panel   AST   ALT   Arthralgia of hands, bilateral   Relevant Medications   traMADol (ULTRAM) 50 MG tablet    Other Visit Diagnoses    Abdominal pressure    -  Primary    Relevant Orders    Urinalysis, Routine w reflex microscopic (not at [facility])    Urine culture        Follow up plan: Return in about 6 months (around 02/28/2016), or if symptoms worsen or fail to improve, for Physical Exam.

## 2015-08-31 NOTE — Assessment & Plan Note (Signed)
The current medical regimen is effective;  continue present plan and medications.  

## 2015-09-01 ENCOUNTER — Encounter: Payer: Self-pay | Admitting: Family Medicine

## 2015-09-01 LAB — BASIC METABOLIC PANEL
BUN / CREAT RATIO: 39 — AB (ref 11–26)
BUN: 31 mg/dL — ABNORMAL HIGH (ref 8–27)
CHLORIDE: 100 mmol/L (ref 97–106)
CO2: 27 mmol/L (ref 18–29)
Calcium: 9.8 mg/dL (ref 8.7–10.3)
Creatinine, Ser: 0.79 mg/dL (ref 0.57–1.00)
GFR, EST AFRICAN AMERICAN: 88 mL/min/{1.73_m2} (ref >59)
GFR, EST NON AFRICAN AMERICAN: 76 mL/min/{1.73_m2} (ref >59)
Glucose: 118 mg/dL — ABNORMAL HIGH (ref 65–99)
POTASSIUM: 4.1 mmol/L (ref 3.5–5.2)
SODIUM: 142 mmol/L (ref 136–144)

## 2015-09-01 LAB — ALT: ALT: 30 IU/L (ref 0–32)

## 2015-09-01 LAB — LIPID PANEL
CHOL/HDL RATIO: 4.1 ratio (ref 0.0–4.4)
Cholesterol, Total: 205 mg/dL — ABNORMAL HIGH (ref 100–199)
HDL: 50 mg/dL (ref >39)
LDL Calculated: 98 mg/dL (ref 0–99)
TRIGLYCERIDES: 283 mg/dL — AB (ref 0–149)
VLDL Cholesterol Cal: 57 mg/dL — ABNORMAL HIGH (ref 5–40)

## 2015-09-01 LAB — AST: AST: 33 IU/L (ref 0–40)

## 2015-09-02 DIAGNOSIS — M7061 Trochanteric bursitis, right hip: Secondary | ICD-10-CM | POA: Diagnosis not present

## 2015-09-02 DIAGNOSIS — M25551 Pain in right hip: Secondary | ICD-10-CM | POA: Diagnosis not present

## 2015-09-02 LAB — URINE CULTURE

## 2015-09-07 ENCOUNTER — Ambulatory Visit: Payer: Medicare Other | Admitting: Family Medicine

## 2015-09-13 ENCOUNTER — Telehealth: Payer: Self-pay | Admitting: Family Medicine

## 2015-09-13 DIAGNOSIS — N390 Urinary tract infection, site not specified: Secondary | ICD-10-CM

## 2015-09-13 MED ORDER — CIPROFLOXACIN HCL 500 MG PO TABS: 500.0000 mg | ORAL_TABLET | Freq: Two times a day (BID) | ORAL | Status: DC

## 2015-09-13 NOTE — Telephone Encounter (Signed)
Pt just finished meds last Thursday and now has another UTI.  Would like to go ahead w referral to uroology and wonders about taking more meds.  Please call to advise

## 2015-09-13 NOTE — Telephone Encounter (Signed)
call

## 2015-09-13 NOTE — Telephone Encounter (Signed)
Phone call Discussed with patient did home test has another UTI Previously been sensitive to Cipro will give Cipro and urology Cipro ccreferral

## 2015-09-15 ENCOUNTER — Ambulatory Visit (INDEPENDENT_AMBULATORY_CARE_PROVIDER_SITE_OTHER): Payer: Medicare Other | Admitting: Obstetrics and Gynecology

## 2015-09-15 ENCOUNTER — Encounter: Payer: Self-pay | Admitting: Obstetrics and Gynecology

## 2015-09-15 VITALS — BP 152/85 | HR 89 | Resp 16 | Ht 62.0 in | Wt 277.5 lb

## 2015-09-15 DIAGNOSIS — N390 Urinary tract infection, site not specified: Secondary | ICD-10-CM | POA: Diagnosis not present

## 2015-09-15 DIAGNOSIS — R1031 Right lower quadrant pain: Secondary | ICD-10-CM

## 2015-09-15 LAB — BLADDER SCAN AMB NON-IMAGING

## 2015-09-15 LAB — URINALYSIS, COMPLETE
BILIRUBIN UA: NEGATIVE
Glucose, UA: NEGATIVE
KETONES UA: NEGATIVE
Nitrite, UA: NEGATIVE
PH UA: 5 (ref 5.0–7.5)
PROTEIN UA: NEGATIVE
RBC UA: NEGATIVE
SPEC GRAV UA: 1.025 (ref 1.005–1.030)
Urobilinogen, Ur: 0.2 mg/dL (ref 0.2–1.0)

## 2015-09-15 LAB — MICROSCOPIC EXAMINATION: RBC MICROSCOPIC, UA: NONE SEEN /HPF (ref 0–?)

## 2015-09-15 MED ORDER — ESTROGENS, CONJUGATED 0.625 MG/GM VA CREA: TOPICAL_CREAM | VAGINAL | Status: DC

## 2015-09-15 NOTE — Progress Notes (Signed)
09/15/2015 11:38 AM   Tracy Espinoza 1946/07/10 [id number]  Referring provider: Guadalupe Maple, MD [address] [place], [place] [address]  Chief Complaint  Patient presents with  . Recurrent UTI  . Establish Care    HPI: Patient is a 69 yo female presenting today as a referral from her PCP for recurrent urinary tract infection. Denies any dysuria, frequency, urgency or gross hematuria. She reports that she just noticed that she was experiencing symptoms of general malaise, fatigue, chills, nausea and sweats. She presented to her primary care office and was diagnosed with urinary tract infection. Urine cultures grew out greater than 100,000 Escherichia coli.  No urinary symptoms. Just feels general malaise, fatigue,  chills, nausea and sweats.  No gross hematuria. No symptoms today.  S/p ureteral reconstruction using a portion of her intestine approximately 20 years ago by Dr. Madelin Headings.  E. Coli positive cultures in Sept and November.  Most recent on 08/31/15 MUG.  08/31/15 Cr 0.79  No history of renal stones.  Intermittent right lower quadrant pain.  No exacerbating or alleviating factors. S/p appendectomy and cholecystectomy.  No vaginal symptoms. S/p hysterectomy BSO.  No longer taking HRT.  She is taking cranberry supplements.  2 sodas per day.  1 glass of wine. 2 quarts of crystal light daily.    PMH: Past Medical History  Diagnosis Date  . Anxiety   . Hypertension     Surgical History: Past Surgical History  Procedure Laterality Date  . Cholecystectomy    . Abdominal hysterectomy    . Joint replacement Bilateral     knees  . Rotator cuff repair  2014    Home Medications:    Medication List       This list is accurate as of: 09/15/15 11:59 PM.  Always use your most recent med list.               amLODipine 5 MG tablet  Commonly known as:  NORVASC  Take 1 tablet (5 mg total) by mouth daily.     aspirin EC 81 MG tablet  Take 81 mg by mouth daily.     conjugated estrogens vaginal cream  Commonly known as:  PREMARIN  Apply a blueberry sized amount to urethra using fingertip nightly x 2 weeks then 3 times weekly at bedtime.     CRANBERRY PO  Take by mouth.     diclofenac sodium 1 % Gel  Commonly known as:  VOLTAREN  Apply 2 g topically 4 (four) times daily as needed.     hydrochlorothiazide 25 MG tablet  Commonly known as:  HYDRODIURIL  Take 1 tablet (25 mg total) by mouth daily.     LORazepam 1 MG tablet  Commonly known as:  ATIVAN  Take 1 tablet (1 mg total) by mouth daily as needed for anxiety.     losartan 100 MG tablet  Commonly known as:  COZAAR  Take 1 tablet (100 mg total) by mouth daily.     Melatonin 10 MG Caps  Take by mouth.     meloxicam 15 MG tablet  Commonly known as:  MOBIC  Take 1 tablet (15 mg total) by mouth daily as needed for pain.     multivitamin tablet  Take 1 tablet by mouth daily.     omeprazole 40 MG capsule  Commonly known as:  PRILOSEC  Take 1 capsule by mouth  daily     oyster calcium 500 MG Tabs tablet  Take 500  mg of elemental calcium by mouth 2 (two) times daily.     Potassium Gluconate 595 MG Caps  Take by mouth.     traMADol 50 MG tablet  Commonly known as:  ULTRAM  Take 1 tablet (50 mg total) by mouth every 6 (six) hours as needed (1-2 Tab).     traZODone 50 MG tablet  Commonly known as:  DESYREL  Take 1 tablet (50 mg total) by mouth at bedtime.     Vitamin D3 1000 UNITS Caps  Take by mouth.        Allergies:  Allergies  Allergen Reactions  . Band-Aid Plus Antibiotic [Bacitracin-Polymyxin B]   . Morphine Sulfate Itching    Family History: Family History  Problem Relation Age of Onset  . Hypertension Mother   . Stroke Mother   . Alzheimer's disease Mother   . Cancer Father     brain  . Hypertension Father   . Stroke Father     Social History:  reports that she has never smoked. She has never used smokeless tobacco. She reports that she drinks about 4.2 oz  of alcohol per week. She reports that she does not use illicit drugs.  ROS: UROLOGY Frequent Urination?: Yes Hard to postpone urination?: Yes Burning/pain with urination?: No Get up at night to urinate?: Yes Leakage of urine?: Yes Urine stream starts and stops?: No Trouble starting stream?: No Do you have to strain to urinate?: No Blood in urine?: No Urinary tract infection?: Yes Sexually transmitted disease?: No Injury to kidneys or bladder?: No Painful intercourse?: No Weak stream?: No Currently pregnant?: No Vaginal bleeding?: No Last menstrual period?: n/a  Gastrointestinal Nausea?: Yes Vomiting?: No Indigestion/heartburn?: Yes Diarrhea?: No Constipation?: No  Constitutional Fever: No Night sweats?: No Weight loss?: No Fatigue?: Yes  Skin Skin rash/lesions?: No Itching?: No  Eyes Blurred vision?: No Double vision?: No  Ears/Nose/Throat Sore throat?: No Sinus problems?: No  Hematologic/Lymphatic Swollen glands?: No Easy bruising?: No  Cardiovascular Leg swelling?: Yes Chest pain?: No  Respiratory Cough?: No Shortness of breath?: No  Endocrine Excessive thirst?: No  Musculoskeletal Back pain?: No Joint pain?: Yes  Neurological Headaches?: No Dizziness?: No  Psychologic Depression?: No Anxiety?: No  Physical Exam: BP 152/85 mmHg  Pulse 89  Resp 16  Ht 5\' 2"  (1.575 m)  Wt 277 lb 8 oz (125.873 kg)  BMI 50.74 kg/m2  Constitutional:  Alert and oriented, No acute distress. HEENT: [place] AT, moist mucus membranes.  Trachea midline, no masses. Cardiovascular: No clubbing, cyanosis, or edema. Respiratory: Normal respiratory effort, no increased work of breathing. GI: Abdomen is soft, nontender, nondistended, no abdominal masses GU: No CVA tenderness.  Mild vaginal atrophy, grade 1 cystocele, no SUI with valsalva Skin: No rashes, bruises or suspicious lesions. Lymph: No cervical or inguinal adenopathy. Neurologic: Grossly intact, no focal  deficits, moving all 4 extremities. Psychiatric: Normal mood and affect.  Laboratory Data:   Urinalysis    Component Value Date/Time   GLUCOSEU Negative 09/15/2015 1342   BILIRUBINUR Negative 09/15/2015 1342   NITRITE Negative 09/15/2015 1342   LEUKOCYTESUR 1+* 09/15/2015 1342    Pertinent Imaging:   Assessment & Plan:    1. Recurrent UTI- UA unremarkable.  PVR 68mL.  UTI prevention strategies discussed.  Good perineal hygiene reviewed. Patient is encouraged to increase daily water intake, start cranberry supplements to prevent invasive colonization along the urinary tract and probiotics, especially lactobacillus to restore normal vaginal flora. - Urinalysis, Complete - BLADDER SCAN AMB  NON-IMAGING  2. Right Lower Quadrant Pain-  S/p appendectomy. CT ordered for further evaluation of possible renal stones.  -CT A/P without contrast  3. Vaginal Atrophy- Prescription for vaginal estrogen cream sent to pharmacy.   Return in about 3 months (around 12/14/2015) for f/u for CT results.  These notes generated with voice recognition software. I apologize for typographical errors.  Herbert Moors, [facility] [address] [place], [place] [address] [phone]

## 2015-09-15 NOTE — Patient Instructions (Signed)

## 2015-09-16 ENCOUNTER — Other Ambulatory Visit: Payer: Self-pay

## 2015-09-16 DIAGNOSIS — M25541 Pain in joints of right hand: Secondary | ICD-10-CM

## 2015-09-16 DIAGNOSIS — M25542 Pain in joints of left hand: Principal | ICD-10-CM

## 2015-09-16 NOTE — Telephone Encounter (Signed)
Pharmacy needs clarification on medication. Signature states "Take 1 tablet by mouth every 6 hours as needed (1-2 Tab)"  The pharmacy wants to know is it 1 tablet or 1-2 tablets every 6 hours.

## 2015-09-16 NOTE — Telephone Encounter (Signed)
1-2 tabs every 6 hours

## 2015-09-16 NOTE — Telephone Encounter (Signed)
Pharmacy Notified

## 2015-10-08 DIAGNOSIS — M7061 Trochanteric bursitis, right hip: Secondary | ICD-10-CM | POA: Insufficient documentation

## 2015-11-10 ENCOUNTER — Ambulatory Visit (INDEPENDENT_AMBULATORY_CARE_PROVIDER_SITE_OTHER): Payer: Medicare Other | Admitting: Obstetrics and Gynecology

## 2015-11-10 ENCOUNTER — Encounter: Payer: Self-pay | Admitting: Obstetrics and Gynecology

## 2015-11-10 VITALS — BP 117/71 | HR 69 | Resp 16 | Ht 62.0 in | Wt 282.6 lb

## 2015-11-10 DIAGNOSIS — N390 Urinary tract infection, site not specified: Secondary | ICD-10-CM | POA: Diagnosis not present

## 2015-11-10 DIAGNOSIS — R1031 Right lower quadrant pain: Secondary | ICD-10-CM

## 2015-11-10 MED ORDER — CEPHALEXIN 500 MG PO CAPS: 500.0000 mg | ORAL_CAPSULE | Freq: Two times a day (BID) | ORAL | Status: DC

## 2015-11-10 NOTE — Progress Notes (Signed)
3:16 PM   Tracy Espinoza 01/15/1946 [id number]  Referring provider: Guadalupe Maple, MD [address] [place], [place] [address]  Chief Complaint  Patient presents with  . Dysuria    HPI: Patient is a 70 yo female presenting today as a referral from her PCP for recurrent urinary tract infection. Denies any dysuria, frequency, urgency or gross hematuria. She reports that she just noticed that she was experiencing symptoms of general malaise, fatigue, chills, nausea and sweats. She presented to her primary care office and was diagnosed with urinary tract infection. Urine cultures grew out greater than 100,000 Escherichia coli. No urinary symptoms. Just feels general malaise, fatigue,  chills, nausea and sweats.  No gross hematuria. No symptoms today. S/p ureteral reconstruction using a portion of her intestine approximately 20 years ago by Dr. Madelin Headings. E. Coli positive cultures in Sept and November.  Most recent on 08/31/15 MUG.  08/31/15 Cr 0.79 No history of renal stones. Intermittent right lower quadrant pain.  No exacerbating or alleviating factors. S/p appendectomy and cholecystectomy. No vaginal symptoms. S/p hysterectomy BSO.  No longer taking HRT. She is taking cranberry supplements.  2 sodas per day.  1 glass of wine. 2 quarts of crystal light daily.   Interval History 11/10/15: Patient reports that she has been taking her cranberry supplements and probiotics extracted. She has tried to increase her fluid intake. She recently began experiencing general malaise and is concerned she may have another urinary tract infection.   No fevers.  No gross hematuria.  Bilateral lower back pain. No exacerbating or alleviating factors.    PMH: Past Medical History  Diagnosis Date  . Anxiety   . Hypertension     Surgical History: Past Surgical History  Procedure Laterality Date  . Cholecystectomy    . Abdominal hysterectomy    . Joint replacement Bilateral     knees  . Rotator cuff repair   2014    Home Medications:    Medication List       This list is accurate as of: 11/10/15  3:16 PM.  Always use your most recent med list.               amLODipine 5 MG tablet  Commonly known as:  NORVASC  Take 1 tablet (5 mg total) by mouth daily.     aspirin EC 81 MG tablet  Take 81 mg by mouth daily.     cephALEXin 500 MG capsule  Commonly known as:  KEFLEX  Take 1 capsule (500 mg total) by mouth 2 (two) times daily.     conjugated estrogens vaginal cream  Commonly known as:  PREMARIN  Apply a blueberry sized amount to urethra using fingertip nightly x 2 weeks then 3 times weekly at bedtime.     CRANBERRY PO  Take by mouth.     diclofenac sodium 1 % Gel  Commonly known as:  VOLTAREN  Apply 2 g topically 4 (four) times daily as needed.     hydrochlorothiazide 25 MG tablet  Commonly known as:  HYDRODIURIL  Take 1 tablet (25 mg total) by mouth daily.     LORazepam 1 MG tablet  Commonly known as:  ATIVAN  Take 1 tablet (1 mg total) by mouth daily as needed for anxiety.     losartan 100 MG tablet  Commonly known as:  COZAAR  Take 1 tablet (100 mg total) by mouth daily.     Melatonin 10 MG Caps  Take by mouth.  meloxicam 15 MG tablet  Commonly known as:  MOBIC  Take 1 tablet (15 mg total) by mouth daily as needed for pain.     multivitamin tablet  Take 1 tablet by mouth daily.     omeprazole 40 MG capsule  Commonly known as:  PRILOSEC  Take 1 capsule by mouth  daily     oyster calcium 500 MG Tabs tablet  Take 500 mg of elemental calcium by mouth 2 (two) times daily.     Potassium Gluconate 595 MG Caps  Take by mouth.     traMADol 50 MG tablet  Commonly known as:  ULTRAM  Take 1 tablet (50 mg total) by mouth every 6 (six) hours as needed (1-2 Tab).     traZODone 50 MG tablet  Commonly known as:  DESYREL  Take 1 tablet (50 mg total) by mouth at bedtime.     Vitamin D3 1000 units Caps  Take by mouth.        Allergies:  Allergies  Allergen  Reactions  . Tape Itching, Dermatitis and Rash  . Band-Aid Plus Antibiotic [Bacitracin-Polymyxin B]   . Iodinated Diagnostic Agents Itching  . Morphine Sulfate Itching    Family History: Family History  Problem Relation Age of Onset  . Hypertension Mother   . Stroke Mother   . Alzheimer's disease Mother   . Cancer Father     brain  . Hypertension Father   . Stroke Father     Social History:  reports that she has never smoked. She has never used smokeless tobacco. She reports that she drinks about 4.2 oz of alcohol per week. She reports that she does not use illicit drugs.  ROS: UROLOGY Frequent Urination?: No Hard to postpone urination?: No Burning/pain with urination?: No Get up at night to urinate?: No Leakage of urine?: No Urine stream starts and stops?: No Trouble starting stream?: No Do you have to strain to urinate?: No Blood in urine?: No Urinary tract infection?: Yes Sexually transmitted disease?: No Injury to kidneys or bladder?: No Painful intercourse?: No Weak stream?: No Currently pregnant?: No Vaginal bleeding?: No Last menstrual period?: n  Gastrointestinal Nausea?: Yes Vomiting?: No Indigestion/heartburn?: No Diarrhea?: No Constipation?: No  Constitutional Fever: No Night sweats?: No Weight loss?: No Fatigue?: No  Skin Skin rash/lesions?: No Itching?: No  Eyes Blurred vision?: No Double vision?: No  Ears/Nose/Throat Sore throat?: No Sinus problems?: No  Hematologic/Lymphatic Swollen glands?: No Easy bruising?: No  Cardiovascular Leg swelling?: No Chest pain?: No  Respiratory Cough?: No Shortness of breath?: No  Endocrine Excessive thirst?: No  Musculoskeletal Back pain?: No Joint pain?: No  Neurological Headaches?: No Dizziness?: No  Psychologic Depression?: No Anxiety?: No  Physical Exam: BP 117/71 mmHg  Pulse 69  Resp 16  Ht 5\' 2"  (1.575 m)  Wt 282 lb 9.6 oz (128.187 kg)  BMI 51.68 kg/m2   Constitutional:  Alert and oriented, No acute distress. HEENT: [place] AT, moist mucus membranes.  Trachea midline, no masses. Cardiovascular: No clubbing, cyanosis, or edema. Respiratory: Normal respiratory effort, no increased work of breathing. GI: Abdomen is soft, nontender, nondistended, no abdominal masses GU: No CVA tenderness.  Skin: No rashes, bruises or suspicious lesions. Neurologic: Grossly intact, no focal deficits, moving all 4 extremities. Psychiatric: Normal mood and affect.  Laboratory Data:   Urinalysis    Component Value Date/Time   GLUCOSEU Negative 09/15/2015 1342   BILIRUBINUR Negative 09/15/2015 1342   NITRITE Negative 09/15/2015 1342   LEUKOCYTESUR  1+* 09/15/2015 1342    Pertinent Imaging:   Assessment & Plan:    1. Recurrent UTI- UA unremarkable.  PVR 63mL.  UTI prevention strategies discussed.  Good perineal hygiene reviewed. Patient is encouraged to increase daily water intake, start cranberry supplements to prevent invasive colonization along the urinary tract and probiotics, especially lactobacillus to restore normal vaginal flora. -Keflex prescription sent to pharmacy will adjust treatment as needed pending culture results. - Urinalysis, Complete - BLADDER SCAN AMB NON-IMAGING  2. Right Lower Quadrant Pain-  S/p appendectomy. CT ordered for further evaluation of possible renal stones as nidus for infection. -CT A/P without contrast  Return for CT results.  These notes generated with voice recognition software. I apologize for typographical errors.  Herbert Moors, [facility] [address], [place], [place] [address] [phone]

## 2015-11-11 LAB — URINALYSIS, COMPLETE
BILIRUBIN UA: NEGATIVE
Glucose, UA: NEGATIVE
Nitrite, UA: POSITIVE — AB
Specific Gravity, UA: 1.02 (ref 1.005–1.030)
Urobilinogen, Ur: 0.2 mg/dL (ref 0.2–1.0)
pH, UA: 6 (ref 5.0–7.5)

## 2015-11-11 LAB — MICROSCOPIC EXAMINATION: RENAL EPITHEL UA: NONE SEEN /HPF

## 2015-11-12 ENCOUNTER — Telehealth: Payer: Self-pay

## 2015-11-12 LAB — CULTURE, URINE COMPREHENSIVE

## 2015-11-12 NOTE — Telephone Encounter (Signed)
-----   Message from Roda Shutters, [place] sent at 11/12/2015  2:26 PM EST ----- Please notify patient that her urine culture was positive for infection. The Keflex that I prescribed her should resolve it. If her symptoms do not improve she needs to follow up in our office and provide [place] with another urine specimen.  [place]

## 2015-11-12 NOTE — Telephone Encounter (Signed)
Spoke with pt and made aware of +ucx and to continue keflex. Pt voiced understanding.

## 2015-11-17 ENCOUNTER — Ambulatory Visit
Admission: RE | Admit: 2015-11-17 | Discharge: 2015-11-17 | Disposition: A | Discharge status: Home / Self Care | Payer: Medicare Other | Source: Ambulatory Visit | Attending: Obstetrics and Gynecology | Admitting: Obstetrics and Gynecology

## 2015-11-17 ENCOUNTER — Telehealth: Payer: Self-pay

## 2015-11-17 DIAGNOSIS — R1031 Right lower quadrant pain: Secondary | ICD-10-CM

## 2015-11-17 DIAGNOSIS — N390 Urinary tract infection, site not specified: Secondary | ICD-10-CM | POA: Diagnosis not present

## 2015-11-17 DIAGNOSIS — N281 Cyst of kidney, acquired: Secondary | ICD-10-CM | POA: Insufficient documentation

## 2015-11-17 DIAGNOSIS — K760 Fatty (change of) liver, not elsewhere classified: Secondary | ICD-10-CM | POA: Insufficient documentation

## 2015-11-17 DIAGNOSIS — R911 Solitary pulmonary nodule: Secondary | ICD-10-CM | POA: Diagnosis not present

## 2015-11-17 NOTE — Telephone Encounter (Signed)
Tracy Espinoza, with [facility] called with CT results. They are in epic.

## 2015-11-19 ENCOUNTER — Ambulatory Visit (INDEPENDENT_AMBULATORY_CARE_PROVIDER_SITE_OTHER): Payer: Medicare Other | Admitting: Obstetrics and Gynecology

## 2015-11-19 ENCOUNTER — Encounter: Payer: Self-pay | Admitting: Obstetrics and Gynecology

## 2015-11-19 VITALS — BP 164/79 | HR 86 | Resp 16 | Ht 62.0 in | Wt 287.5 lb

## 2015-11-19 DIAGNOSIS — N390 Urinary tract infection, site not specified: Secondary | ICD-10-CM | POA: Diagnosis not present

## 2015-11-19 NOTE — Addendum Note (Signed)
Addended by: Bettye Boeck on: 11/19/2015 04:55 PM   Modules accepted: Orders

## 2015-11-19 NOTE — Progress Notes (Signed)
3:58 PM   Tracy Espinoza 05-Feb-1946 [id number]  Referring provider: Guadalupe Maple, MD [address] [place], [place] [address]  Chief Complaint  Patient presents with  . Recurrent UTI  . Results    CT    HPI: Patient is a 70 yo female presenting today as a referral from her PCP for recurrent urinary tract infection. Denies any dysuria, frequency, urgency or gross hematuria. She reports that she just noticed that she was experiencing symptoms of general malaise, fatigue, chills, nausea and sweats. She presented to her primary care office and was diagnosed with urinary tract infection. Urine cultures grew out greater than 100,000 Escherichia coli. No urinary symptoms. Just feels general malaise, fatigue,  chills, nausea and sweats.  No gross hematuria. No symptoms today. S/p ureteral reconstruction using a portion of her intestine approximately 20 years ago by Dr. Madelin Headings. E. Coli positive cultures in Sept and November.  Most recent on 08/31/15 MUG.  08/31/15 Cr 0.79 No history of renal stones. Intermittent right lower quadrant pain.  No exacerbating or alleviating factors. S/p appendectomy and cholecystectomy. No vaginal symptoms. S/p hysterectomy BSO.  No longer taking HRT. She is taking cranberry supplements.  2 sodas per day.  1 glass of wine. 2 quarts of crystal light daily.   Interval History 11/10/15: Patient reports that she has been  Overall patient reprots feeling well. She continues to experience intermittent right lower quadrant pain area and she presents today to review her CT scan results. Findings are reviewed below. Denies any urinary symptoms  PMH: Past Medical History  Diagnosis Date  . Anxiety   . Hypertension     Surgical History: Past Surgical History  Procedure Laterality Date  . Cholecystectomy    . Abdominal hysterectomy    . Joint replacement Bilateral     knees  . Rotator cuff repair  2014    Home Medications:    Medication List       This list  is accurate as of: 11/19/15  3:58 PM.  Always use your most recent med list.               amLODipine 5 MG tablet  Commonly known as:  NORVASC  Take 1 tablet (5 mg total) by mouth daily.     aspirin EC 81 MG tablet  Take 81 mg by mouth daily.     calcium carbonate 1250 (500 Ca) MG tablet  Commonly known as:  OS-CAL - dosed in mg of elemental calcium  Take by mouth.     conjugated estrogens vaginal cream  Commonly known as:  PREMARIN  Apply a blueberry sized amount to urethra using fingertip nightly x 2 weeks then 3 times weekly at bedtime.     CRANBERRY PO  Take by mouth.     diclofenac sodium 1 % Gel  Commonly known as:  VOLTAREN  Apply 2 g topically 4 (four) times daily as needed.     hydrochlorothiazide 25 MG tablet  Commonly known as:  HYDRODIURIL  Take 1 tablet (25 mg total) by mouth daily.     LORazepam 1 MG tablet  Commonly known as:  ATIVAN  Take 1 tablet (1 mg total) by mouth daily as needed for anxiety.     losartan 100 MG tablet  Commonly known as:  COZAAR  Take 1 tablet (100 mg total) by mouth daily.     Melatonin 10 MG Caps  Take by mouth.     meloxicam 15 MG tablet  Commonly known as:  MOBIC  Take 1 tablet (15 mg total) by mouth daily as needed for pain.     multivitamin tablet  Take 1 tablet by mouth daily.     omeprazole 40 MG capsule  Commonly known as:  PRILOSEC  Take 1 capsule by mouth  daily     oyster calcium 500 MG Tabs tablet  Take 500 mg of elemental calcium by mouth 2 (two) times daily.     Potassium Gluconate 595 MG Caps  Take by mouth.     traMADol 50 MG tablet  Commonly known as:  ULTRAM  Take 1 tablet (50 mg total) by mouth every 6 (six) hours as needed (1-2 Tab).     traZODone 50 MG tablet  Commonly known as:  DESYREL  Take 1 tablet (50 mg total) by mouth at bedtime.     Vitamin D3 1000 units Caps  Take by mouth.        Allergies:  Allergies  Allergen Reactions  . Tape Itching, Dermatitis and Rash  . Band-Aid  Plus Antibiotic [Bacitracin-Polymyxin B]   . Iodinated Diagnostic Agents Itching  . Morphine Sulfate Itching    Family History: Family History  Problem Relation Age of Onset  . Hypertension Mother   . Stroke Mother   . Alzheimer's disease Mother   . Cancer Father     brain  . Hypertension Father   . Stroke Father     Social History:  reports that she has never smoked. She has never used smokeless tobacco. She reports that she drinks about 4.2 oz of alcohol per week. She reports that she does not use illicit drugs.  ROS: UROLOGY Frequent Urination?: No Hard to postpone urination?: No Burning/pain with urination?: No Get up at night to urinate?: No Leakage of urine?: No Urine stream starts and stops?: No Trouble starting stream?: No Do you have to strain to urinate?: No Blood in urine?: No Urinary tract infection?: No Sexually transmitted disease?: No Injury to kidneys or bladder?: No Painful intercourse?: No Weak stream?: No Currently pregnant?: No Vaginal bleeding?: No Last menstrual period?: n  Gastrointestinal Nausea?: No Vomiting?: No Indigestion/heartburn?: No Diarrhea?: No Constipation?: No  Constitutional Fever: No Night sweats?: No Weight loss?: No Fatigue?: No  Skin Skin rash/lesions?: No Itching?: No  Eyes Blurred vision?: No Double vision?: No  Ears/Nose/Throat Sore throat?: No Sinus problems?: No  Hematologic/Lymphatic Swollen glands?: No Easy bruising?: No  Cardiovascular Leg swelling?: No Chest pain?: No  Respiratory Cough?: No Shortness of breath?: No  Endocrine Excessive thirst?: No  Musculoskeletal Back pain?: Yes Joint pain?: No  Neurological Headaches?: No Dizziness?: No  Psychologic Depression?: No Anxiety?: No  Physical Exam: BP 164/79 mmHg  Pulse 86  Resp 16  Ht 5\' 2"  (1.575 m)  Wt 287 lb 8 oz (130.409 kg)  BMI 52.57 kg/m2  Constitutional:  Alert and oriented, No acute distress. HEENT: [place] AT, moist  mucus membranes.  Trachea midline, no masses. Cardiovascular: No clubbing, cyanosis, or edema. Respiratory: Normal respiratory effort, no increased work of breathing. GI: Abdomen is soft, nontender, nondistended, no abdominal masses GU: No CVA tenderness.  Skin: No rashes, bruises or suspicious lesions. Neurologic: Grossly intact, no focal deficits, moving all 4 extremities. Psychiatric: Normal mood and affect.  Laboratory Data:   Urinalysis    Component Value Date/Time   GLUCOSEU Negative 11/10/2015 1149   BILIRUBINUR Negative 11/10/2015 1149   NITRITE Positive* 11/10/2015 1149   LEUKOCYTESUR 3+* 11/10/2015 1149  Pertinent Imaging: CLINICAL DATA: Right lower quadrant pain beginning in August. Cholecystectomy. Appendectomy. Partial hysterectomy. IV contrast allergy. Recurrent urinary tract infections.  EXAM: CT ABDOMEN AND PELVIS WITHOUT CONTRAST  TECHNIQUE: Multidetector CT imaging of the abdomen and pelvis was performed following the standard protocol without IV contrast.  COMPARISON: None.  FINDINGS: Lower chest: A left lower lobe pulmonary nodule is suspected at 1.0 cm on image 9/series 3 and coronal image 76. Mild bibasilar volume loss.  Mild cardiomegaly, without pericardial effusion. Contrast throughout the lower esophagus with a small hiatal hernia.  Hepatobiliary: Moderate hepatic steatosis. Hypoattenuation adjacent the falciform ligament is also favored to be due to more focal steatosis. Cholecystectomy, without biliary ductal dilatation.  Pancreas: Normal, without mass or ductal dilatation.  Spleen: Hypo attenuating foci within the superior aspect of the spleen x2. Maximally 1.4 cm. No splenomegaly.  Adrenals/Urinary Tract: Normal adrenal glands. An interpolar right renal fluid density lesion is likely a cyst at 2.0 cm. There is a left renal presumed cyst in the interpolar region of 3.0 cm. Bilateral too small to characterize renal  lesions. Moderate to marked left renal cortical thinning. Left extrarenal pelvis. Punctate lower pole right renal collecting system stone suspected on coronal reformats. The left ureter is positioned anterior and lateral of typical. No obstructive process identified. No ureteric stone. Decompressed bladder. Probable vascular calcifications along the course of the distal right ureter.  Stomach/Bowel: Normal remainder of the stomach. Extensive colonic diverticulosis. Normal terminal ileum. Mid to distal small bowel enterotomy.  Vascular/Lymphatic: Normal caliber of the aorta and branch vessels. No abdominopelvic adenopathy. Hysterectomy.  Reproductive: No adnexal mass.  Other: No significant free fluid.  Musculoskeletal: Grade 1 L4-5 anterolisthesis which is likely degenerative.  IMPRESSION: 1. Probable punctate lower pole right renal collecting system calculus. 2. No other explanation for right-sided pain. 3. Left renal scarring with bilateral renal cysts and too small to characterize lesions. 4. Hepatic steatosis. 5. Nonspecific low-density splenic lesions. Unless there is left upper quadrant pain or fever, these are of questionable clinical significance. 6. Contrast filled lower esophagus, suggesting dysmotility or gastroesophageal reflux disease. 7. probable left lower lobe pulmonary nodule. Recommend initial evaluation with dedicated chest CT.   Assessment & Plan:    1. Recurrent UTI- UA unremarkable.  Previous PVR 20mL.    2. Right Lower Quadrant Pain-   CT abdomen demonstrating no clear explanation for patient's right-sided pain other then probable punctate lower pole right renal collecting system calculus which is highly unlikely to be causing her pain.  3. Lung Nodule-   Patient aware of CT finding. She was provided a copy of her CT report and instructed to follow up with her primary care provider. I will also for the CT report to her PCP Dr. Jeananne Rama.    Return if symptoms worsen or fail to improve.  These notes generated with voice recognition software. I apologize for typographical errors.  Herbert Moors, [facility] [address] [place], [place] [address] [phone]

## 2015-11-22 ENCOUNTER — Telehealth: Payer: Self-pay

## 2015-11-22 ENCOUNTER — Other Ambulatory Visit: Payer: Self-pay | Admitting: Family Medicine

## 2015-11-22 DIAGNOSIS — R911 Solitary pulmonary nodule: Secondary | ICD-10-CM

## 2015-11-22 NOTE — Telephone Encounter (Signed)
Phone call Discussed with patient chest CT nodule will arrange for follow-up CT as requested

## 2015-11-25 ENCOUNTER — Ambulatory Visit: Payer: Medicare Other | Admitting: Obstetrics and Gynecology

## 2015-11-29 ENCOUNTER — Other Ambulatory Visit: Payer: Self-pay | Admitting: Family Medicine

## 2015-12-01 ENCOUNTER — Other Ambulatory Visit: Payer: Self-pay | Admitting: Family Medicine

## 2015-12-01 ENCOUNTER — Ambulatory Visit: Payer: Medicare Other

## 2015-12-01 ENCOUNTER — Ambulatory Visit
Admission: RE | Admit: 2015-12-01 | Discharge: 2015-12-01 | Disposition: A | Discharge status: Home / Self Care | Payer: Medicare Other | Source: Ambulatory Visit | Attending: Family Medicine | Admitting: Family Medicine

## 2015-12-01 DIAGNOSIS — R911 Solitary pulmonary nodule: Secondary | ICD-10-CM

## 2015-12-03 ENCOUNTER — Ambulatory Visit
Admission: RE | Admit: 2015-12-03 | Discharge: 2015-12-03 | Disposition: A | Discharge status: Home / Self Care | Payer: Medicare Other | Source: Ambulatory Visit | Attending: Family Medicine | Admitting: Family Medicine

## 2015-12-03 DIAGNOSIS — J8410 Pulmonary fibrosis, unspecified: Secondary | ICD-10-CM | POA: Diagnosis not present

## 2015-12-03 DIAGNOSIS — R918 Other nonspecific abnormal finding of lung field: Secondary | ICD-10-CM | POA: Diagnosis not present

## 2015-12-03 DIAGNOSIS — R911 Solitary pulmonary nodule: Secondary | ICD-10-CM | POA: Insufficient documentation

## 2015-12-06 ENCOUNTER — Telehealth: Payer: Self-pay | Admitting: Family Medicine

## 2015-12-06 NOTE — Telephone Encounter (Signed)
-----   Message from Wynn Maudlin, [place] sent at 12/06/2015  4:28 PM EST ----- labs

## 2015-12-06 NOTE — Telephone Encounter (Signed)
Phone call Discussed with patient CT scan patient low risk wants to repeat in 6 months instead of year will repeat in just over 6 months Patient very anxious and states she will keep up with the recall.

## 2015-12-15 ENCOUNTER — Ambulatory Visit: Payer: Medicare Other | Admitting: Obstetrics and Gynecology

## 2015-12-27 ENCOUNTER — Other Ambulatory Visit: Payer: Self-pay

## 2015-12-27 DIAGNOSIS — Z1231 Encounter for screening mammogram for malignant neoplasm of breast: Secondary | ICD-10-CM

## 2015-12-31 ENCOUNTER — Encounter: Payer: Self-pay | Admitting: Family Medicine

## 2016-01-27 DIAGNOSIS — M25552 Pain in left hip: Secondary | ICD-10-CM | POA: Diagnosis not present

## 2016-01-27 DIAGNOSIS — M5416 Radiculopathy, lumbar region: Secondary | ICD-10-CM | POA: Diagnosis not present

## 2016-01-28 ENCOUNTER — Ambulatory Visit
Admission: RE | Admit: 2016-01-28 | Discharge: 2016-01-28 | Disposition: A | Discharge status: Home / Self Care | Payer: Medicare Other | Source: Ambulatory Visit

## 2016-01-28 DIAGNOSIS — Z1231 Encounter for screening mammogram for malignant neoplasm of breast: Secondary | ICD-10-CM

## 2016-02-25 ENCOUNTER — Other Ambulatory Visit: Payer: Self-pay

## 2016-02-25 DIAGNOSIS — M25541 Pain in joints of right hand: Secondary | ICD-10-CM

## 2016-02-25 DIAGNOSIS — M25542 Pain in joints of left hand: Principal | ICD-10-CM

## 2016-02-29 MED ORDER — TRAMADOL HCL 50 MG PO TABS: 50.0000 mg | ORAL_TABLET | Freq: Four times a day (QID) | ORAL | Status: DC | PRN

## 2016-02-29 NOTE — Telephone Encounter (Signed)
Fax rx

## 2016-03-01 ENCOUNTER — Other Ambulatory Visit: Payer: Self-pay | Admitting: Physician Assistant

## 2016-03-01 DIAGNOSIS — M4316 Spondylolisthesis, lumbar region: Secondary | ICD-10-CM

## 2016-03-13 ENCOUNTER — Ambulatory Visit
Admission: RE | Admit: 2016-03-13 | Discharge: 2016-03-13 | Disposition: A | Discharge status: Home / Self Care | Payer: Medicare Other | Source: Ambulatory Visit | Attending: Physician Assistant | Admitting: Physician Assistant

## 2016-03-13 DIAGNOSIS — M5136 Other intervertebral disc degeneration, lumbar region: Secondary | ICD-10-CM | POA: Diagnosis not present

## 2016-03-13 DIAGNOSIS — M4806 Spinal stenosis, lumbar region: Secondary | ICD-10-CM | POA: Diagnosis not present

## 2016-03-13 DIAGNOSIS — M7138 Other bursal cyst, other site: Secondary | ICD-10-CM | POA: Insufficient documentation

## 2016-03-13 DIAGNOSIS — M4316 Spondylolisthesis, lumbar region: Secondary | ICD-10-CM | POA: Insufficient documentation

## 2016-03-14 DIAGNOSIS — M5416 Radiculopathy, lumbar region: Secondary | ICD-10-CM | POA: Diagnosis not present

## 2016-03-14 DIAGNOSIS — M5136 Other intervertebral disc degeneration, lumbar region: Secondary | ICD-10-CM | POA: Diagnosis not present

## 2016-03-23 ENCOUNTER — Telehealth: Payer: Self-pay

## 2016-03-23 DIAGNOSIS — M25541 Pain in joints of right hand: Secondary | ICD-10-CM

## 2016-03-23 DIAGNOSIS — M25542 Pain in joints of left hand: Principal | ICD-10-CM

## 2016-03-23 MED ORDER — LORAZEPAM 1 MG PO TABS: 1.0000 mg | ORAL_TABLET | Freq: Every day | ORAL | Status: DC | PRN

## 2016-03-23 NOTE — Telephone Encounter (Signed)
Fax Rx and patient has refills on tramadol

## 2016-03-23 NOTE — Telephone Encounter (Signed)
Patient's appointment had to be rescheduled until end of July  Needs refills of meds - [facility]  Especially needs Lorazepam and Tramadol

## 2016-03-27 ENCOUNTER — Other Ambulatory Visit: Payer: Self-pay | Admitting: Family Medicine

## 2016-03-27 ENCOUNTER — Telehealth: Payer: Self-pay

## 2016-03-27 MED ORDER — OMEPRAZOLE 40 MG PO CPDR: 40.0000 mg | DELAYED_RELEASE_CAPSULE | Freq: Two times a day (BID) | ORAL | Status: DC

## 2016-03-27 NOTE — Telephone Encounter (Signed)
Sent in 1 month of prilosec that should get her through until her scheduled appt next month. Thanks

## 2016-03-27 NOTE — Telephone Encounter (Signed)
Received fax from [facility] wanting clarification of Tramadol - confirmed patient takes 1-2 Tab Q6H PRN, will call [facility]  Patient then informed me that she is seeing [facility] for back injections( Dr. Eliberto Ivory) and they are not helping much and has not been out since May, she is concerned unless her next injection help tremendously she will not be able to come in for her CPE with [facility] and will need meds refilled.  I told her just let us know if she needs to reschedule and [facility] could refill meds to get her to next appointment.  Lastly patient stated she was taking her 40mg  Omeprazole BID since her hernia surgery, discussed with [facility], her Rx was written for once daily so she is almost out.  She needs a new Rx sent to [facility].

## 2016-03-30 ENCOUNTER — Encounter: Payer: Self-pay | Admitting: Family Medicine

## 2016-03-30 ENCOUNTER — Other Ambulatory Visit: Payer: Self-pay | Admitting: Family Medicine

## 2016-04-26 DIAGNOSIS — M4806 Spinal stenosis, lumbar region: Secondary | ICD-10-CM | POA: Diagnosis not present

## 2016-04-26 DIAGNOSIS — M5136 Other intervertebral disc degeneration, lumbar region: Secondary | ICD-10-CM | POA: Diagnosis not present

## 2016-04-26 DIAGNOSIS — M5416 Radiculopathy, lumbar region: Secondary | ICD-10-CM | POA: Diagnosis not present

## 2016-04-27 ENCOUNTER — Ambulatory Visit (INDEPENDENT_AMBULATORY_CARE_PROVIDER_SITE_OTHER): Payer: Medicare Other | Admitting: Family Medicine

## 2016-04-27 ENCOUNTER — Encounter: Payer: Self-pay | Admitting: Family Medicine

## 2016-04-27 DIAGNOSIS — M4806 Spinal stenosis, lumbar region: Secondary | ICD-10-CM

## 2016-04-27 DIAGNOSIS — F419 Anxiety disorder, unspecified: Secondary | ICD-10-CM

## 2016-04-27 DIAGNOSIS — I10 Essential (primary) hypertension: Secondary | ICD-10-CM | POA: Diagnosis not present

## 2016-04-27 DIAGNOSIS — M48061 Spinal stenosis, lumbar region without neurogenic claudication: Secondary | ICD-10-CM

## 2016-04-27 DIAGNOSIS — E7800 Pure hypercholesterolemia, unspecified: Secondary | ICD-10-CM

## 2016-04-27 DIAGNOSIS — M5416 Radiculopathy, lumbar region: Secondary | ICD-10-CM | POA: Diagnosis not present

## 2016-04-27 MED ORDER — AMLODIPINE BESYLATE 5 MG PO TABS: 5.0000 mg | ORAL_TABLET | Freq: Every day | ORAL | 4 refills | Status: DC

## 2016-04-27 MED ORDER — HYDROCHLOROTHIAZIDE 25 MG PO TABS: 25.0000 mg | ORAL_TABLET | Freq: Every day | ORAL | 4 refills | Status: DC

## 2016-04-27 MED ORDER — LOSARTAN POTASSIUM 100 MG PO TABS: 100.0000 mg | ORAL_TABLET | Freq: Every day | ORAL | 4 refills | Status: DC

## 2016-04-27 MED ORDER — OMEPRAZOLE 40 MG PO CPDR: 40.0000 mg | DELAYED_RELEASE_CAPSULE | Freq: Two times a day (BID) | ORAL | 4 refills | Status: DC

## 2016-04-27 MED ORDER — TRAZODONE HCL 50 MG PO TABS: 50.0000 mg | ORAL_TABLET | Freq: Every day | ORAL | 4 refills | Status: DC

## 2016-04-27 MED ORDER — MELOXICAM 15 MG PO TABS: 15.0000 mg | ORAL_TABLET | Freq: Every day | ORAL | 4 refills | Status: DC | PRN

## 2016-04-27 NOTE — Assessment & Plan Note (Signed)
The current medical regimen is effective;  continue present plan and medications.  

## 2016-04-27 NOTE — Progress Notes (Signed)
BP 126/79 (BP Location: Left Arm, Patient Position: Sitting, Cuff Size: Large)   Pulse 76   Temp 98.4 F (36.9 C)   Ht 5' 2.7" (1.593 m)   Wt 283 lb (128.4 kg)   SpO2 97%   BMI 50.61 kg/m    Subjective:    Patient ID: Tracy Espinoza, female    DOB: Jan 09, 1946, 70 y.o.   MRN: [id number]  HPI: Tracy Espinoza is a 70 y.o. female  Chief Complaint  Patient presents with  . Annual Exam  Patient's primary concern is been spinal stenosis with some radiculopathy is been followed by [facility] and has neurosurgery appointment coming up later next month. Getting some injections and maybe some help.  Reflux does okay as long as takes Prilosec 40 1 in the morning 1 in the evening This because of hiatal hernia and reflux changes seen on CT.   Blood pressure doing well with change in medications much cheaper and good blood pressure control with no side effects Cholesterol doing well   taking lorazepam on a when necessary basis doesn't take every day Helps with day-to-day aggravation and stress patient's most recent stress is been unexpected death of her brother with ongoing investigations.  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 Interim medical history since our last visit reviewed. Allergies and medications reviewed and updated.  Review of Systems  Constitutional: Negative.   HENT: Negative.   Eyes: Negative.   Respiratory: Negative.   Cardiovascular: Negative.   Gastrointestinal: Negative.   Endocrine: Negative.   Genitourinary: Negative.   Musculoskeletal: Negative.   Skin: Negative.   Allergic/Immunologic: Negative.   Neurological:  Negative.   Hematological: Negative.   Psychiatric/Behavioral: Negative.     Per HPI unless specifically indicated above     Objective:    BP 126/79 (BP Location: Left Arm, Patient Position: Sitting, Cuff Size: Large)   Pulse 76   Temp 98.4 F (36.9 C)   Ht 5' 2.7" (1.593 m)   Wt 283 lb (128.4 kg)   SpO2 97%   BMI 50.61 kg/m   Wt Readings from Last 3 Encounters:  04/27/16 283 lb (128.4 kg)  11/19/15 287 lb 8 oz (130.4 kg)  11/10/15 282 lb 9.6 oz (128.2 kg)    Physical Exam  Constitutional: She is oriented to person, place, and time. She appears well-developed and well-nourished.  HENT:  Head: Normocephalic and atraumatic.  Right Ear: External ear normal.  Left Ear: External ear normal.  Nose: Nose normal.  Mouth/Throat: Oropharynx is clear and moist.  Eyes: Conjunctivae and EOM are normal. Pupils are equal, round, and reactive to light.  Neck: Normal range of motion. Neck supple. Carotid bruit is not present.  Cardiovascular: Normal rate, regular rhythm and normal heart sounds.   No murmur heard. Pulmonary/Chest: Effort normal and breath sounds normal. She exhibits no mass. Right breast exhibits no mass, no skin change and no tenderness. Left breast exhibits no mass, no skin change and no tenderness. Breasts are symmetrical.  Abdominal: Soft. Bowel sounds are normal. There is no hepatosplenomegaly.  Musculoskeletal: Normal range of motion.  Neurological: She is alert and oriented to person, place,  and time.  Skin: No rash noted.  Psychiatric: She has a normal mood and affect. Her behavior is normal. Judgment and thought content normal.        Assessment & Plan:   Problem List Items Addressed This Visit      Cardiovascular and Mediastinum   Hypertension    The current medical regimen is effective;  continue present plan and medications.       Relevant Medications   amLODipine (NORVASC) 5 MG tablet   hydrochlorothiazide (HYDRODIURIL) 25 MG tablet   losartan  (COZAAR) 100 MG tablet   Other Relevant Orders   Comprehensive metabolic panel   Lipid panel   CBC with Differential/Platelet   TSH   Urinalysis, Routine w reflex microscopic (not at [facility])     Other   Hypercholesteremia    The current medical regimen is effective;  continue present plan and medications.       Relevant Medications   amLODipine (NORVASC) 5 MG tablet   hydrochlorothiazide (HYDRODIURIL) 25 MG tablet   losartan (COZAAR) 100 MG tablet   Other Relevant Orders   Comprehensive metabolic panel   Lipid panel   CBC with Differential/Platelet   TSH   Urinalysis, Routine w reflex microscopic (not at [facility])   Spinal stenosis of lumbar region with radiculopathy   Relevant Orders   TSH   Anxiety    The current medical regimen is effective;  continue present plan and medications.       Relevant Medications   traZODone (DESYREL) 50 MG tablet   Other Relevant Orders   TSH    Other Visit Diagnoses   None.      Follow up plan: Return in about 6 months (around 10/28/2016) for bmp.

## 2016-04-28 LAB — LIPID PANEL
Chol/HDL Ratio: 3.9 ratio units (ref 0.0–4.4)
Cholesterol, Total: 215 mg/dL — ABNORMAL HIGH (ref 100–199)
HDL: 55 mg/dL (ref >39)
LDL Calculated: 103 mg/dL — ABNORMAL HIGH (ref 0–99)
Triglycerides: 286 mg/dL — ABNORMAL HIGH (ref 0–149)
VLDL Cholesterol Cal: 57 mg/dL — ABNORMAL HIGH (ref 5–40)

## 2016-04-28 LAB — CBC WITH DIFFERENTIAL/PLATELET
Basophils Absolute: 0 10*3/uL (ref 0.0–0.2)
Basos: 0 %
EOS (ABSOLUTE): 0 10*3/uL (ref 0.0–0.4)
EOS: 0 %
HEMOGLOBIN: 13.5 g/dL (ref 11.1–15.9)
Hematocrit: 40.6 % (ref 34.0–46.6)
IMMATURE GRANULOCYTES: 1 %
Immature Grans (Abs): 0.1 10*3/uL (ref 0.0–0.1)
LYMPHS: 34 %
Lymphocytes Absolute: 5 10*3/uL — ABNORMAL HIGH (ref 0.7–3.1)
MCH: 29.7 pg (ref 26.6–33.0)
MCHC: 33.3 g/dL (ref 31.5–35.7)
MCV: 89 fL (ref 79–97)
MONOCYTES: 8 %
Monocytes Absolute: 1.1 10*3/uL — ABNORMAL HIGH (ref 0.1–0.9)
NEUTROS PCT: 57 %
Neutrophils Absolute: 8.4 10*3/uL — ABNORMAL HIGH (ref 1.4–7.0)
Platelets: 247 10*3/uL (ref 150–379)
RBC: 4.54 x10E6/uL (ref 3.77–5.28)
RDW: 14.3 % (ref 12.3–15.4)
WBC: 14.6 10*3/uL — AB (ref 3.4–10.8)

## 2016-04-28 LAB — URINALYSIS, ROUTINE W REFLEX MICROSCOPIC
Bilirubin, UA: NEGATIVE
GLUCOSE, UA: NEGATIVE
Ketones, UA: NEGATIVE
NITRITE UA: NEGATIVE
RBC, UA: NEGATIVE
Specific Gravity, UA: 1.015 (ref 1.005–1.030)
Urobilinogen, Ur: 0.2 mg/dL (ref 0.2–1.0)
pH, UA: 5.5 (ref 5.0–7.5)

## 2016-04-28 LAB — COMPREHENSIVE METABOLIC PANEL
A/G RATIO: 1.6 (ref 1.2–2.2)
ALBUMIN: 4.3 g/dL (ref 3.5–4.8)
ALT: 22 IU/L (ref 0–32)
AST: 22 IU/L (ref 0–40)
Alkaline Phosphatase: 39 IU/L (ref 39–117)
BILIRUBIN TOTAL: 0.4 mg/dL (ref 0.0–1.2)
BUN / CREAT RATIO: 39 — AB (ref 12–28)
BUN: 32 mg/dL — ABNORMAL HIGH (ref 8–27)
CHLORIDE: 99 mmol/L (ref 96–106)
CO2: 25 mmol/L (ref 18–29)
Calcium: 10.3 mg/dL (ref 8.7–10.3)
Creatinine, Ser: 0.83 mg/dL (ref 0.57–1.00)
GFR calc non Af Amer: 71 mL/min/{1.73_m2} (ref >59)
GFR, EST AFRICAN AMERICAN: 82 mL/min/{1.73_m2} (ref >59)
Globulin, Total: 2.7 g/dL (ref 1.5–4.5)
Glucose: 118 mg/dL — ABNORMAL HIGH (ref 65–99)
POTASSIUM: 4.2 mmol/L (ref 3.5–5.2)
SODIUM: 144 mmol/L (ref 134–144)
TOTAL PROTEIN: 7 g/dL (ref 6.0–8.5)

## 2016-04-28 LAB — MICROSCOPIC EXAMINATION

## 2016-04-28 LAB — TSH: TSH: 0.72 u[IU]/mL (ref 0.450–4.500)

## 2016-05-01 ENCOUNTER — Telehealth: Payer: Self-pay | Admitting: Family Medicine

## 2016-05-01 DIAGNOSIS — N3000 Acute cystitis without hematuria: Secondary | ICD-10-CM

## 2016-05-01 MED ORDER — CIPROFLOXACIN HCL 250 MG PO TABS: 250.0000 mg | ORAL_TABLET | Freq: Two times a day (BID) | ORAL | 0 refills | Status: DC

## 2016-05-01 NOTE — Telephone Encounter (Signed)
Phone call Discussed with patient elevated white count and urine showing infection patient with some mild symptoms will treat recheck CBC and urinalysis 2 weeks.

## 2016-05-31 DIAGNOSIS — I10 Essential (primary) hypertension: Secondary | ICD-10-CM | POA: Diagnosis not present

## 2016-05-31 DIAGNOSIS — M4316 Spondylolisthesis, lumbar region: Secondary | ICD-10-CM | POA: Diagnosis not present

## 2016-05-31 DIAGNOSIS — Z6841 Body Mass Index (BMI) 40.0 and over, adult: Secondary | ICD-10-CM | POA: Diagnosis not present

## 2016-06-07 ENCOUNTER — Other Ambulatory Visit: Payer: Self-pay | Admitting: Neurological Surgery

## 2016-06-08 DIAGNOSIS — Z23 Encounter for immunization: Secondary | ICD-10-CM | POA: Diagnosis not present

## 2016-06-26 ENCOUNTER — Ambulatory Visit (INDEPENDENT_AMBULATORY_CARE_PROVIDER_SITE_OTHER): Payer: Medicare Other | Admitting: Family Medicine

## 2016-06-26 ENCOUNTER — Encounter: Payer: Self-pay | Admitting: Family Medicine

## 2016-06-26 VITALS — BP 139/83 | HR 69 | Temp 97.8°F

## 2016-06-26 DIAGNOSIS — N390 Urinary tract infection, site not specified: Secondary | ICD-10-CM

## 2016-06-26 LAB — URINALYSIS, ROUTINE W REFLEX MICROSCOPIC
BILIRUBIN UA: NEGATIVE
GLUCOSE, UA: NEGATIVE
KETONES UA: NEGATIVE
Nitrite, UA: NEGATIVE
SPEC GRAV UA: 1.015 (ref 1.005–1.030)
Urobilinogen, Ur: 0.2 mg/dL (ref 0.2–1.0)
pH, UA: 5.5 (ref 5.0–7.5)

## 2016-06-26 LAB — MICROSCOPIC EXAMINATION

## 2016-06-26 NOTE — Patient Instructions (Signed)
Bring in urine sample in 1 week for recheck

## 2016-06-26 NOTE — Progress Notes (Signed)
   BP 139/83 (BP Location: Left Arm, Patient Position: Sitting, Cuff Size: Large)   Pulse 69   Temp 97.8 F (36.6 C)   SpO2 94%    Subjective:    Patient ID: Tracy Espinoza, female    DOB: 07-14-1946, 70 y.o.   MRN: [id number]  HPI: Tracy Espinoza is a 70 y.o. female  Chief Complaint  Patient presents with  . Urinary Tract Infection   Patient presents with brown-red urine x 3 days. Uses the home UTI test strips which showed positive over weekend. Called the nurse help line Saturday and had a week's worth of Cipro sent in which she has been taking faithfully. Denies dysuria, flank pain, or fevers. Last seen by Urology in February and was placed on a cranberry, probiotic, and premarin regimen that seems to help reduce the frequency of UTIs but she is still having a fair amount. Having back surgery in 2 weeks and really needing to get this current episode resolved prior to it.   Relevant past medical, surgical, family and social history reviewed and updated as indicated. Interim medical history since our last visit reviewed. Allergies and medications reviewed and updated.  Review of Systems  Constitutional: Negative.   HENT: Negative.   Respiratory: Negative.   Cardiovascular: Negative.   Gastrointestinal: Negative for abdominal pain, nausea and vomiting.  Genitourinary: Positive for frequency.       Red brown urine  Musculoskeletal: Negative.   Skin: Negative.   Neurological: Negative.   Psychiatric/Behavioral: Negative.     Per HPI unless specifically indicated above     Objective:    BP 139/83 (BP Location: Left Arm, Patient Position: Sitting, Cuff Size: Large)   Pulse 69   Temp 97.8 F (36.6 C)   SpO2 94%   Wt Readings from Last 3 Encounters:  04/27/16 283 lb (128.4 kg)  11/19/15 287 lb 8 oz (130.4 kg)  11/10/15 282 lb 9.6 oz (128.2 kg)    Physical Exam  Constitutional: She is oriented to person, place, and time. She appears well-developed and well-nourished. No  distress.  HENT:  Head: Atraumatic.  Eyes: Conjunctivae are normal. No scleral icterus.  Neck: Normal range of motion. Neck supple.  Cardiovascular: Normal rate and normal heart sounds.   Pulmonary/Chest: Effort normal and breath sounds normal. No respiratory distress.  Abdominal: Soft. Bowel sounds are normal. There is no tenderness.  Musculoskeletal: Normal range of motion.  No CVA tenderness  Neurological: She is alert and oriented to person, place, and time.  Skin: Skin is warm and dry.  Psychiatric: She has a normal mood and affect. Her behavior is normal.  Nursing note and vitals reviewed.       Assessment & Plan:   Problem List Items Addressed This Visit    None    Visit Diagnoses    Recurrent UTI    -  Primary   U/A with trace leuks and patient afebrile without flank pain - seems to be improving on cipro. Continue cipro, recheck U/A in 1 week   Relevant Orders   Urinalysis, Routine w reflex microscopic (not at [facility]) (Completed)   UA/M w/rflx Culture, Routine   Urine Culture       Follow up plan: Return if symptoms worsen or fail to improve.

## 2016-06-27 LAB — URINE CULTURE

## 2016-06-28 ENCOUNTER — Telehealth: Payer: Self-pay | Admitting: Family Medicine

## 2016-06-28 NOTE — Telephone Encounter (Signed)
Please let her know that no bacteria grew on her urine culture, so likely the cipro has knocked it out. Continue as planned, finish the cipro and send a repeat specimen as discussed.

## 2016-06-28 NOTE — Telephone Encounter (Signed)
Patient notified

## 2016-07-03 ENCOUNTER — Other Ambulatory Visit: Payer: Self-pay

## 2016-07-03 ENCOUNTER — Encounter (HOSPITAL_COMMUNITY): Payer: Self-pay

## 2016-07-03 ENCOUNTER — Encounter (HOSPITAL_COMMUNITY)
Admission: RE | Admit: 2016-07-03 | Discharge: 2016-07-03 | Disposition: A | Discharge status: Home / Self Care | Payer: Medicare Other | Source: Ambulatory Visit | Attending: Neurological Surgery | Admitting: Neurological Surgery

## 2016-07-03 ENCOUNTER — Other Ambulatory Visit: Payer: Medicare Other

## 2016-07-03 ENCOUNTER — Telehealth: Payer: Self-pay | Admitting: Family Medicine

## 2016-07-03 DIAGNOSIS — Z01812 Encounter for preprocedural laboratory examination: Secondary | ICD-10-CM | POA: Diagnosis not present

## 2016-07-03 DIAGNOSIS — Z0183 Encounter for blood typing: Secondary | ICD-10-CM | POA: Diagnosis not present

## 2016-07-03 DIAGNOSIS — M4316 Spondylolisthesis, lumbar region: Secondary | ICD-10-CM | POA: Diagnosis not present

## 2016-07-03 DIAGNOSIS — N390 Urinary tract infection, site not specified: Secondary | ICD-10-CM | POA: Diagnosis not present

## 2016-07-03 DIAGNOSIS — M7138 Other bursal cyst, other site: Secondary | ICD-10-CM | POA: Diagnosis not present

## 2016-07-03 DIAGNOSIS — Z01818 Encounter for other preprocedural examination: Secondary | ICD-10-CM | POA: Insufficient documentation

## 2016-07-03 DIAGNOSIS — Z79899 Other long term (current) drug therapy: Secondary | ICD-10-CM | POA: Insufficient documentation

## 2016-07-03 HISTORY — DX: Personal history of other diseases of the digestive system: Z87.19

## 2016-07-03 HISTORY — DX: Unspecified osteoarthritis, unspecified site: M19.90

## 2016-07-03 HISTORY — DX: Malignant (primary) neoplasm, unspecified: C80.1

## 2016-07-03 HISTORY — DX: Gastro-esophageal reflux disease without esophagitis: K21.9

## 2016-07-03 LAB — URINALYSIS, ROUTINE W REFLEX MICROSCOPIC
BILIRUBIN URINE: NEGATIVE
Glucose, UA: NEGATIVE mg/dL
HGB URINE DIPSTICK: NEGATIVE
Ketones, ur: NEGATIVE mg/dL
Nitrite: NEGATIVE
PH: 5.5 (ref 5.0–8.0)
Protein, ur: NEGATIVE mg/dL
SPECIFIC GRAVITY, URINE: 1.018 (ref 1.005–1.030)

## 2016-07-03 LAB — URINE MICROSCOPIC-ADD ON

## 2016-07-03 LAB — UA/M W/RFLX CULTURE, ROUTINE
BILIRUBIN UA: NEGATIVE
GLUCOSE, UA: NEGATIVE
Ketones, UA: NEGATIVE
Leukocytes, UA: NEGATIVE
Nitrite, UA: NEGATIVE
PROTEIN UA: NEGATIVE
RBC, UA: NEGATIVE
Specific Gravity, UA: 1.015 (ref 1.005–1.030)
Urobilinogen, Ur: 0.2 mg/dL (ref 0.2–1.0)
pH, UA: 5.5 (ref 5.0–7.5)

## 2016-07-03 LAB — TYPE AND SCREEN
ABO/RH(D): A POS
ANTIBODY SCREEN: NEGATIVE

## 2016-07-03 LAB — SURGICAL PCR SCREEN
MRSA, PCR: NEGATIVE
STAPHYLOCOCCUS AUREUS: NEGATIVE

## 2016-07-03 LAB — CBC
HEMATOCRIT: 46.8 % — AB (ref 36.0–46.0)
HEMOGLOBIN: 14.8 g/dL (ref 12.0–15.0)
MCH: 30.1 pg (ref 26.0–34.0)
MCHC: 31.6 g/dL (ref 30.0–36.0)
MCV: 95.3 fL (ref 78.0–100.0)
Platelets: 220 10*3/uL (ref 150–400)
RBC: 4.91 MIL/uL (ref 3.87–5.11)
RDW: 13 % (ref 11.5–15.5)
WBC: 8.8 10*3/uL (ref 4.0–10.5)

## 2016-07-03 LAB — COMPREHENSIVE METABOLIC PANEL
ALBUMIN: 4.2 g/dL (ref 3.5–5.0)
ALK PHOS: 41 U/L (ref 38–126)
ALT: 19 U/L (ref 14–54)
ANION GAP: 9 (ref 5–15)
AST: 20 U/L (ref 15–41)
BILIRUBIN TOTAL: 0.7 mg/dL (ref 0.3–1.2)
BUN: 29 mg/dL — AB (ref 6–20)
CALCIUM: 10.9 mg/dL — AB (ref 8.9–10.3)
CO2: 29 mmol/L (ref 22–32)
Chloride: 99 mmol/L — ABNORMAL LOW (ref 101–111)
Creatinine, Ser: 0.82 mg/dL (ref 0.44–1.00)
GFR calc Af Amer: >60 mL/min (ref >60)
GLUCOSE: 113 mg/dL — AB (ref 65–99)
Potassium: 3.6 mmol/L (ref 3.5–5.1)
Sodium: 137 mmol/L (ref 135–145)
TOTAL PROTEIN: 6.9 g/dL (ref 6.5–8.1)

## 2016-07-03 LAB — ABO/RH: ABO/RH(D): A POS

## 2016-07-03 NOTE — Telephone Encounter (Signed)
Please let her know that her repeat U/A showed no infection remaining.

## 2016-07-03 NOTE — Telephone Encounter (Signed)
Left message to call.

## 2016-07-03 NOTE — Progress Notes (Addendum)
PCP is Dr. Jerilynn Mages. [place]   LOV 04/27/2016 About 20 yrs ago, she had echo for a 'floppy valve'  Has not seen or been to any cardio since.  No problems since.  Denies any heart issues at this time. She does have recurrent UTI's.   Has just finished a course of Cipro from her PCP.  She never really has any symptoms per say, but can tell by looking at urine.

## 2016-07-04 ENCOUNTER — Telehealth: Payer: Self-pay | Admitting: Family Medicine

## 2016-07-04 MED ORDER — LORAZEPAM 1 MG PO TABS: 1.0000 mg | ORAL_TABLET | Freq: Every day | ORAL | 1 refills | Status: DC | PRN

## 2016-07-04 NOTE — Telephone Encounter (Signed)
Call patient she is requesting lorazepam and in June she was given 30 pills with 5 refills. She should still have some left if not let me know.

## 2016-07-04 NOTE — Telephone Encounter (Signed)
Patient notified

## 2016-07-04 NOTE — Telephone Encounter (Signed)
rx

## 2016-07-04 NOTE — Telephone Encounter (Signed)
[facility] states patient has not had that there since 2015 and it had only 1 refill on it.

## 2016-07-04 NOTE — Telephone Encounter (Signed)
Pt requesting Lorazapam, would like it faxed [facility].  She has upcoming surgery and is having anxiety.  Please do not send to [facility]. Please let pt know when it's done so she can go pick up.

## 2016-07-10 ENCOUNTER — Encounter (HOSPITAL_COMMUNITY): Payer: Self-pay | Admitting: Surgery

## 2016-07-10 ENCOUNTER — Inpatient Hospital Stay (HOSPITAL_COMMUNITY): Payer: Medicare Other

## 2016-07-10 ENCOUNTER — Inpatient Hospital Stay (HOSPITAL_COMMUNITY): Payer: Medicare Other | Admitting: Certified Registered"

## 2016-07-10 ENCOUNTER — Inpatient Hospital Stay (HOSPITAL_COMMUNITY)
Admission: RE | Admit: 2016-07-10 | Discharge: 2016-07-15 | DRG: 460 | Disposition: A | Discharge status: Skilled Nursing Facility | Payer: Medicare Other | Source: Ambulatory Visit | Attending: Neurological Surgery | Admitting: Neurological Surgery

## 2016-07-10 ENCOUNTER — Encounter (HOSPITAL_COMMUNITY)
Admission: RE | Disposition: A | Discharge status: Skilled Nursing Facility | Payer: Self-pay | Source: Ambulatory Visit | Attending: Neurological Surgery

## 2016-07-10 DIAGNOSIS — F411 Generalized anxiety disorder: Secondary | ICD-10-CM | POA: Diagnosis not present

## 2016-07-10 DIAGNOSIS — Z5189 Encounter for other specified aftercare: Secondary | ICD-10-CM | POA: Diagnosis not present

## 2016-07-10 DIAGNOSIS — Z6841 Body Mass Index (BMI) 40.0 and over, adult: Secondary | ICD-10-CM

## 2016-07-10 DIAGNOSIS — M6281 Muscle weakness (generalized): Secondary | ICD-10-CM | POA: Diagnosis not present

## 2016-07-10 DIAGNOSIS — M549 Dorsalgia, unspecified: Secondary | ICD-10-CM | POA: Diagnosis not present

## 2016-07-10 DIAGNOSIS — G4700 Insomnia, unspecified: Secondary | ICD-10-CM | POA: Diagnosis not present

## 2016-07-10 DIAGNOSIS — I25810 Atherosclerosis of coronary artery bypass graft(s) without angina pectoris: Secondary | ICD-10-CM | POA: Diagnosis not present

## 2016-07-10 DIAGNOSIS — M7138 Other bursal cyst, other site: Secondary | ICD-10-CM | POA: Diagnosis present

## 2016-07-10 DIAGNOSIS — K449 Diaphragmatic hernia without obstruction or gangrene: Secondary | ICD-10-CM | POA: Diagnosis present

## 2016-07-10 DIAGNOSIS — I10 Essential (primary) hypertension: Secondary | ICD-10-CM | POA: Diagnosis present

## 2016-07-10 DIAGNOSIS — M4326 Fusion of spine, lumbar region: Secondary | ICD-10-CM | POA: Diagnosis not present

## 2016-07-10 DIAGNOSIS — M4727 Other spondylosis with radiculopathy, lumbosacral region: Secondary | ICD-10-CM | POA: Diagnosis not present

## 2016-07-10 DIAGNOSIS — M4316 Spondylolisthesis, lumbar region: Secondary | ICD-10-CM

## 2016-07-10 DIAGNOSIS — K219 Gastro-esophageal reflux disease without esophagitis: Secondary | ICD-10-CM | POA: Diagnosis not present

## 2016-07-10 DIAGNOSIS — M5416 Radiculopathy, lumbar region: Secondary | ICD-10-CM | POA: Diagnosis present

## 2016-07-10 DIAGNOSIS — M1990 Unspecified osteoarthritis, unspecified site: Secondary | ICD-10-CM | POA: Diagnosis not present

## 2016-07-10 DIAGNOSIS — M48062 Spinal stenosis, lumbar region with neurogenic claudication: Secondary | ICD-10-CM | POA: Diagnosis present

## 2016-07-10 DIAGNOSIS — Z419 Encounter for procedure for purposes other than remedying health state, unspecified: Secondary | ICD-10-CM

## 2016-07-10 DIAGNOSIS — M48061 Spinal stenosis, lumbar region without neurogenic claudication: Secondary | ICD-10-CM | POA: Diagnosis not present

## 2016-07-10 SURGERY — POSTERIOR LUMBAR FUSION 1 LEVEL
Anesthesia: General | Site: Back

## 2016-07-10 MED ORDER — SODIUM CHLORIDE 0.9 % IV SOLN
INTRAVENOUS | Status: DC
  Administered 2016-07-10: 17:00:00 via INTRAVENOUS

## 2016-07-10 MED ORDER — THROMBIN 5000 UNITS EX SOLR
CUTANEOUS | Status: AC
  Filled 2016-07-10: qty 5000

## 2016-07-10 MED ORDER — METHOCARBAMOL 500 MG PO TABS
500.0000 mg | ORAL_TABLET | Freq: Four times a day (QID) | ORAL | Status: DC | PRN
  Administered 2016-07-10 – 2016-07-15 (×10): 500 mg via ORAL
  Filled 2016-07-10 (×9): qty 1

## 2016-07-10 MED ORDER — ROCURONIUM BROMIDE 10 MG/ML (PF) SYRINGE
PREFILLED_SYRINGE | INTRAVENOUS | Status: AC
  Filled 2016-07-10: qty 10

## 2016-07-10 MED ORDER — SUGAMMADEX SODIUM 500 MG/5ML IV SOLN
INTRAVENOUS | Status: AC
  Filled 2016-07-10: qty 5

## 2016-07-10 MED ORDER — MENTHOL 3 MG MT LOZG: 1.0000 | LOZENGE | OROMUCOSAL | Status: DC | PRN

## 2016-07-10 MED ORDER — OXYCODONE HCL 5 MG PO TABS
ORAL_TABLET | ORAL | Status: AC
  Filled 2016-07-10: qty 1

## 2016-07-10 MED ORDER — FENTANYL CITRATE (PF) 100 MCG/2ML IJ SOLN
INTRAMUSCULAR | Status: AC
  Filled 2016-07-10: qty 4

## 2016-07-10 MED ORDER — LOSARTAN POTASSIUM 50 MG PO TABS
100.0000 mg | ORAL_TABLET | Freq: Every day | ORAL | Status: DC
  Administered 2016-07-11 – 2016-07-15 (×4): 100 mg via ORAL
  Filled 2016-07-10 (×5): qty 2

## 2016-07-10 MED ORDER — PHENYLEPHRINE HCL 10 MG/ML IJ SOLN
INTRAMUSCULAR | Status: DC | PRN
  Administered 2016-07-10: 50 ug/min via INTRAVENOUS

## 2016-07-10 MED ORDER — METHOCARBAMOL 500 MG PO TABS
ORAL_TABLET | ORAL | Status: AC
  Filled 2016-07-10: qty 1

## 2016-07-10 MED ORDER — MIDAZOLAM HCL 2 MG/2ML IJ SOLN
INTRAMUSCULAR | Status: DC | PRN
  Administered 2016-07-10: 2 mg via INTRAVENOUS

## 2016-07-10 MED ORDER — HYDROMORPHONE HCL 1 MG/ML IJ SOLN
INTRAMUSCULAR | Status: AC
  Filled 2016-07-10: qty 1

## 2016-07-10 MED ORDER — DEXAMETHASONE SODIUM PHOSPHATE 10 MG/ML IJ SOLN
INTRAMUSCULAR | Status: DC | PRN
  Administered 2016-07-10: 5 mg via INTRAVENOUS

## 2016-07-10 MED ORDER — CEFAZOLIN SODIUM-DEXTROSE 2-4 GM/100ML-% IV SOLN
INTRAVENOUS | Status: AC
  Filled 2016-07-10: qty 100

## 2016-07-10 MED ORDER — LIDOCAINE 2% (20 MG/ML) 5 ML SYRINGE
INTRAMUSCULAR | Status: AC
  Filled 2016-07-10: qty 5

## 2016-07-10 MED ORDER — PANTOPRAZOLE SODIUM 40 MG PO TBEC
40.0000 mg | DELAYED_RELEASE_TABLET | Freq: Every day | ORAL | Status: DC
  Administered 2016-07-11 – 2016-07-15 (×5): 40 mg via ORAL
  Filled 2016-07-10 (×5): qty 1

## 2016-07-10 MED ORDER — AMLODIPINE BESYLATE 5 MG PO TABS
5.0000 mg | ORAL_TABLET | Freq: Every day | ORAL | Status: DC
  Administered 2016-07-11 – 2016-07-15 (×4): 5 mg via ORAL
  Filled 2016-07-10 (×3): qty 1
  Filled 2016-07-10: qty 2
  Filled 2016-07-10: qty 1

## 2016-07-10 MED ORDER — SODIUM CHLORIDE 0.9 % IV SOLN: 250.0000 mL | INTRAVENOUS | Status: DC

## 2016-07-10 MED ORDER — CEFAZOLIN IN D5W 1 GM/50ML IV SOLN
1.0000 g | Freq: Three times a day (TID) | INTRAVENOUS | Status: AC
  Administered 2016-07-10 – 2016-07-11 (×2): 1 g via INTRAVENOUS
  Filled 2016-07-10 (×2): qty 50

## 2016-07-10 MED ORDER — HYDROMORPHONE HCL 1 MG/ML IJ SOLN
0.5000 mg | INTRAMUSCULAR | Status: DC | PRN
  Administered 2016-07-10 – 2016-07-11 (×5): 1 mg via INTRAVENOUS
  Filled 2016-07-10 (×5): qty 1

## 2016-07-10 MED ORDER — CEFAZOLIN SODIUM 1 G IJ SOLR
INTRAMUSCULAR | Status: AC
  Filled 2016-07-10: qty 10

## 2016-07-10 MED ORDER — FLEET ENEMA 7-19 GM/118ML RE ENEM: 1.0000 | ENEMA | Freq: Once | RECTAL | Status: DC | PRN

## 2016-07-10 MED ORDER — CHLORHEXIDINE GLUCONATE CLOTH 2 % EX PADS: 6.0000 | MEDICATED_PAD | Freq: Once | CUTANEOUS | Status: DC

## 2016-07-10 MED ORDER — HYDROCHLOROTHIAZIDE 25 MG PO TABS
25.0000 mg | ORAL_TABLET | Freq: Every day | ORAL | Status: DC
  Administered 2016-07-11 – 2016-07-15 (×4): 25 mg via ORAL
  Filled 2016-07-10 (×4): qty 1

## 2016-07-10 MED ORDER — OXYCODONE-ACETAMINOPHEN 5-325 MG PO TABS
1.0000 | ORAL_TABLET | ORAL | Status: DC | PRN
  Administered 2016-07-10 – 2016-07-15 (×18): 2 via ORAL
  Filled 2016-07-10 (×19): qty 2

## 2016-07-10 MED ORDER — OXYCODONE HCL 5 MG PO TABS
5.0000 mg | ORAL_TABLET | Freq: Once | ORAL | Status: AC | PRN
  Administered 2016-07-10: 5 mg via ORAL

## 2016-07-10 MED ORDER — POLYETHYLENE GLYCOL 3350 17 G PO PACK: 17.0000 g | PACK | Freq: Every day | ORAL | Status: DC | PRN

## 2016-07-10 MED ORDER — POLYVINYL ALCOHOL 1.4 % OP SOLN
1.0000 [drp] | Freq: Every day | OPHTHALMIC | Status: DC | PRN
  Filled 2016-07-10: qty 15

## 2016-07-10 MED ORDER — SENNA 8.6 MG PO TABS
1.0000 | ORAL_TABLET | Freq: Two times a day (BID) | ORAL | Status: DC
  Administered 2016-07-10 – 2016-07-15 (×8): 8.6 mg via ORAL
  Filled 2016-07-10 (×11): qty 1

## 2016-07-10 MED ORDER — PROPOFOL 10 MG/ML IV BOLUS
INTRAVENOUS | Status: DC | PRN
  Administered 2016-07-10: 150 mg via INTRAVENOUS

## 2016-07-10 MED ORDER — SUGAMMADEX SODIUM 200 MG/2ML IV SOLN
INTRAVENOUS | Status: DC | PRN
  Administered 2016-07-10: 500 mg via INTRAVENOUS

## 2016-07-10 MED ORDER — BUPIVACAINE HCL (PF) 0.5 % IJ SOLN
INTRAMUSCULAR | Status: AC
  Filled 2016-07-10: qty 30

## 2016-07-10 MED ORDER — LIDOCAINE HCL (CARDIAC) 20 MG/ML IV SOLN
INTRAVENOUS | Status: DC | PRN
  Administered 2016-07-10: 50 mg via INTRAVENOUS

## 2016-07-10 MED ORDER — TRAMADOL HCL 50 MG PO TABS: 150.0000 mg | ORAL_TABLET | Freq: Three times a day (TID) | ORAL | Status: DC | PRN

## 2016-07-10 MED ORDER — ROCURONIUM BROMIDE 100 MG/10ML IV SOLN
INTRAVENOUS | Status: DC | PRN
  Administered 2016-07-10: 20 mg via INTRAVENOUS
  Administered 2016-07-10: 30 mg via INTRAVENOUS
  Administered 2016-07-10: 50 mg via INTRAVENOUS
  Administered 2016-07-10: 30 mg via INTRAVENOUS

## 2016-07-10 MED ORDER — LACTATED RINGERS IV SOLN
INTRAVENOUS | Status: DC | PRN
  Administered 2016-07-10 (×3): via INTRAVENOUS

## 2016-07-10 MED ORDER — FENTANYL CITRATE (PF) 100 MCG/2ML IJ SOLN
INTRAMUSCULAR | Status: DC | PRN
  Administered 2016-07-10: 100 ug via INTRAVENOUS
  Administered 2016-07-10: 50 ug via INTRAVENOUS
  Administered 2016-07-10: 100 ug via INTRAVENOUS

## 2016-07-10 MED ORDER — DOCUSATE SODIUM 100 MG PO CAPS
100.0000 mg | ORAL_CAPSULE | Freq: Two times a day (BID) | ORAL | Status: DC
  Administered 2016-07-10 – 2016-07-15 (×8): 100 mg via ORAL
  Filled 2016-07-10 (×11): qty 1

## 2016-07-10 MED ORDER — HYDROCODONE-ACETAMINOPHEN 5-325 MG PO TABS
1.0000 | ORAL_TABLET | ORAL | Status: DC | PRN
  Administered 2016-07-14: 1 via ORAL
  Administered 2016-07-15 (×2): 2 via ORAL
  Filled 2016-07-10: qty 1
  Filled 2016-07-10 (×2): qty 2

## 2016-07-10 MED ORDER — KETOROLAC TROMETHAMINE 15 MG/ML IJ SOLN
15.0000 mg | Freq: Four times a day (QID) | INTRAMUSCULAR | Status: AC
  Administered 2016-07-10 – 2016-07-11 (×5): 15 mg via INTRAVENOUS
  Filled 2016-07-10 (×4): qty 1

## 2016-07-10 MED ORDER — 0.9 % SODIUM CHLORIDE (POUR BTL) OPTIME
TOPICAL | Status: DC | PRN
  Administered 2016-07-10: 1000 mL

## 2016-07-10 MED ORDER — SODIUM CHLORIDE 0.9 % IR SOLN
Status: DC | PRN
  Administered 2016-07-10: 08:00:00

## 2016-07-10 MED ORDER — BISACODYL 10 MG RE SUPP: 10.0000 mg | Freq: Every day | RECTAL | Status: DC | PRN

## 2016-07-10 MED ORDER — THROMBIN 20000 UNITS EX SOLR
CUTANEOUS | Status: AC
  Filled 2016-07-10: qty 20000

## 2016-07-10 MED ORDER — DEXTROSE 5 % IV SOLN
500.0000 mg | Freq: Four times a day (QID) | INTRAVENOUS | Status: DC | PRN
  Filled 2016-07-10: qty 5

## 2016-07-10 MED ORDER — MIDAZOLAM HCL 2 MG/2ML IJ SOLN
INTRAMUSCULAR | Status: AC
  Filled 2016-07-10: qty 2

## 2016-07-10 MED ORDER — DIPHENHYDRAMINE HCL 25 MG PO CAPS
25.0000 mg | ORAL_CAPSULE | Freq: Four times a day (QID) | ORAL | Status: DC | PRN
  Administered 2016-07-10 – 2016-07-14 (×3): 25 mg via ORAL
  Filled 2016-07-10 (×3): qty 1

## 2016-07-10 MED ORDER — PHENOL 1.4 % MT LIQD
1.0000 | OROMUCOSAL | Status: DC | PRN
Start: 2016-07-10 — End: 2016-07-15

## 2016-07-10 MED ORDER — DEXAMETHASONE SODIUM PHOSPHATE 10 MG/ML IJ SOLN
INTRAMUSCULAR | Status: AC
  Filled 2016-07-10: qty 1

## 2016-07-10 MED ORDER — LIDOCAINE-EPINEPHRINE 1 %-1:100000 IJ SOLN
INTRAMUSCULAR | Status: AC
  Filled 2016-07-10: qty 1

## 2016-07-10 MED ORDER — ACETAMINOPHEN 325 MG PO TABS
650.0000 mg | ORAL_TABLET | ORAL | Status: DC | PRN
  Filled 2016-07-10: qty 2

## 2016-07-10 MED ORDER — BUPIVACAINE HCL (PF) 0.5 % IJ SOLN
INTRAMUSCULAR | Status: DC | PRN
  Administered 2016-07-10: 10 mL

## 2016-07-10 MED ORDER — CEFAZOLIN SODIUM-DEXTROSE 2-4 GM/100ML-% IV SOLN
2.0000 g | INTRAVENOUS | Status: AC
  Administered 2016-07-10: 1 g via INTRAVENOUS
  Administered 2016-07-10: 2 g via INTRAVENOUS

## 2016-07-10 MED ORDER — LORAZEPAM 1 MG PO TABS: 1.0000 mg | ORAL_TABLET | Freq: Every day | ORAL | Status: DC | PRN

## 2016-07-10 MED ORDER — SODIUM CHLORIDE 0.9% FLUSH
3.0000 mL | Freq: Two times a day (BID) | INTRAVENOUS | Status: DC
  Administered 2016-07-11 – 2016-07-14 (×8): 3 mL via INTRAVENOUS

## 2016-07-10 MED ORDER — PROPOFOL 10 MG/ML IV BOLUS
INTRAVENOUS | Status: AC
  Filled 2016-07-10: qty 20

## 2016-07-10 MED ORDER — ALBUMIN HUMAN 5 % IV SOLN
INTRAVENOUS | Status: DC | PRN
  Administered 2016-07-10: 10:00:00 via INTRAVENOUS

## 2016-07-10 MED ORDER — THROMBIN 20000 UNITS EX SOLR
CUTANEOUS | Status: DC | PRN
  Administered 2016-07-10: 08:00:00 via TOPICAL

## 2016-07-10 MED ORDER — ONDANSETRON HCL 4 MG/2ML IJ SOLN
4.0000 mg | INTRAMUSCULAR | Status: DC | PRN
  Administered 2016-07-12: 4 mg via INTRAVENOUS
  Filled 2016-07-10: qty 2

## 2016-07-10 MED ORDER — LIDOCAINE-EPINEPHRINE 1 %-1:100000 IJ SOLN
INTRAMUSCULAR | Status: DC | PRN
  Administered 2016-07-10: 10 mL

## 2016-07-10 MED ORDER — KETOROLAC TROMETHAMINE 15 MG/ML IJ SOLN
INTRAMUSCULAR | Status: AC
  Filled 2016-07-10: qty 1

## 2016-07-10 MED ORDER — ACETAMINOPHEN 650 MG RE SUPP: 650.0000 mg | RECTAL | Status: DC | PRN

## 2016-07-10 MED ORDER — DIPHENHYDRAMINE HCL 50 MG/ML IJ SOLN: 25.0000 mg | Freq: Four times a day (QID) | INTRAMUSCULAR | Status: DC | PRN

## 2016-07-10 MED ORDER — ALUM & MAG HYDROXIDE-SIMETH 200-200-20 MG/5ML PO SUSP: 30.0000 mL | Freq: Four times a day (QID) | ORAL | Status: DC | PRN

## 2016-07-10 MED ORDER — OXYCODONE HCL 5 MG/5ML PO SOLN: 5.0000 mg | Freq: Once | ORAL | Status: AC | PRN

## 2016-07-10 MED ORDER — ONDANSETRON HCL 4 MG/2ML IJ SOLN: 4.0000 mg | Freq: Four times a day (QID) | INTRAMUSCULAR | Status: DC | PRN

## 2016-07-10 MED ORDER — PHENYLEPHRINE HCL 10 MG/ML IJ SOLN
INTRAMUSCULAR | Status: DC | PRN
  Administered 2016-07-10: 40 ug via INTRAVENOUS
  Administered 2016-07-10 (×5): 80 ug via INTRAVENOUS
  Administered 2016-07-10: 40 ug via INTRAVENOUS
  Administered 2016-07-10: 80 ug via INTRAVENOUS

## 2016-07-10 MED ORDER — ONDANSETRON HCL 4 MG/2ML IJ SOLN
INTRAMUSCULAR | Status: DC | PRN
Start: 2016-07-10 — End: 2016-07-10
  Administered 2016-07-10: 4 mg via INTRAVENOUS

## 2016-07-10 MED ORDER — SACCHAROMYCES BOULARDII 250 MG PO CAPS
250.0000 mg | ORAL_CAPSULE | Freq: Every day | ORAL | Status: DC
  Administered 2016-07-10 – 2016-07-15 (×6): 250 mg via ORAL
  Filled 2016-07-10 (×6): qty 1

## 2016-07-10 MED ORDER — PHENYLEPHRINE 40 MCG/ML (10ML) SYRINGE FOR IV PUSH (FOR BLOOD PRESSURE SUPPORT)
PREFILLED_SYRINGE | INTRAVENOUS | Status: AC
  Filled 2016-07-10: qty 20

## 2016-07-10 MED ORDER — GELATIN ABSORBABLE MT POWD
OROMUCOSAL | Status: DC | PRN
  Administered 2016-07-10 (×2): via TOPICAL

## 2016-07-10 MED ORDER — SODIUM CHLORIDE 0.9% FLUSH: 3.0000 mL | INTRAVENOUS | Status: DC | PRN

## 2016-07-10 MED ORDER — TRAZODONE HCL 50 MG PO TABS
50.0000 mg | ORAL_TABLET | Freq: Every day | ORAL | Status: DC
  Administered 2016-07-11 – 2016-07-14 (×4): 50 mg via ORAL
  Filled 2016-07-10 (×5): qty 1

## 2016-07-10 MED ORDER — ONDANSETRON HCL 4 MG/2ML IJ SOLN
INTRAMUSCULAR | Status: AC
  Filled 2016-07-10: qty 2

## 2016-07-10 MED ORDER — HYDROMORPHONE HCL 1 MG/ML IJ SOLN
0.2500 mg | INTRAMUSCULAR | Status: DC | PRN
  Administered 2016-07-10 (×5): 0.5 mg via INTRAVENOUS

## 2016-07-10 MED ORDER — FENTANYL CITRATE (PF) 100 MCG/2ML IJ SOLN
INTRAMUSCULAR | Status: AC
  Filled 2016-07-10: qty 2

## 2016-07-10 MED FILL — Sodium Chloride IV Soln 0.9%: INTRAVENOUS | Qty: 1000 | Status: AC

## 2016-07-10 MED FILL — Heparin Sodium (Porcine) Inj 1000 Unit/ML: INTRAMUSCULAR | Qty: 30 | Status: AC

## 2016-07-10 SURGICAL SUPPLY — 72 items
ADH SKN CLS APL DERMABOND .7 (GAUZE/BANDAGES/DRESSINGS) ×1
APL SRG 60D 8 XTD TIP BNDBL (TIP)
BAG DECANTER FOR FLEXI CONT (MISCELLANEOUS) ×3 IMPLANT
BLADE CLIPPER SURG (BLADE) IMPLANT
BONE EQUIVA 5CC (Bone Implant) ×2 IMPLANT
BUR MATCHSTICK NEURO 3.0 LAGG (BURR) ×3 IMPLANT
CANISTER SUCT 3000ML PPV (MISCELLANEOUS) ×3 IMPLANT
CONT SPEC 4OZ CLIKSEAL STRL BL (MISCELLANEOUS) ×3 IMPLANT
COVER BACK TABLE 60X90IN (DRAPES) ×3 IMPLANT
DECANTER SPIKE VIAL GLASS SM (MISCELLANEOUS) ×3 IMPLANT
DERMABOND ADVANCED (GAUZE/BANDAGES/DRESSINGS) ×2
DERMABOND ADVANCED .7 DNX12 (GAUZE/BANDAGES/DRESSINGS) ×1 IMPLANT
DEVICE DISSECT PLASMABLAD 3.0S (MISCELLANEOUS) ×1 IMPLANT
DRAPE C-ARM 42X72 X-RAY (DRAPES) ×6 IMPLANT
DRAPE HALF SHEET 40X57 (DRAPES) IMPLANT
DRAPE LAPAROTOMY 100X72X124 (DRAPES) ×3 IMPLANT
DRAPE POUCH INSTRU U-SHP 10X18 (DRAPES) ×3 IMPLANT
DRAPE PROXIMA HALF (DRAPES) ×4 IMPLANT
DURAPREP 26ML APPLICATOR (WOUND CARE) ×3 IMPLANT
DURASEAL APPLICATOR TIP (TIP) IMPLANT
DURASEAL SPINE SEALANT 3ML (MISCELLANEOUS) IMPLANT
ELECT REM PT RETURN 9FT ADLT (ELECTROSURGICAL) ×3
ELECTRODE REM PT RTRN 9FT ADLT (ELECTROSURGICAL) ×1 IMPLANT
GAUZE SPONGE 4X4 12PLY STRL (GAUZE/BANDAGES/DRESSINGS) ×3 IMPLANT
GAUZE SPONGE 4X4 16PLY XRAY LF (GAUZE/BANDAGES/DRESSINGS) IMPLANT
GLOVE BIO SURGEON STRL SZ 6.5 (GLOVE) ×3 IMPLANT
GLOVE BIO SURGEON STRL SZ8 (GLOVE) ×2 IMPLANT
GLOVE BIO SURGEON STRL SZ8.5 (GLOVE) ×2 IMPLANT
GLOVE BIO SURGEONS STRL SZ 6.5 (GLOVE) ×3
GLOVE BIOGEL PI IND STRL 7.0 (GLOVE) IMPLANT
GLOVE BIOGEL PI IND STRL 7.5 (GLOVE) IMPLANT
GLOVE BIOGEL PI IND STRL 8.5 (GLOVE) ×2 IMPLANT
GLOVE BIOGEL PI INDICATOR 7.0 (GLOVE) ×2
GLOVE BIOGEL PI INDICATOR 7.5 (GLOVE) ×2
GLOVE BIOGEL PI INDICATOR 8.5 (GLOVE) ×4
GLOVE ECLIPSE 8.5 STRL (GLOVE) ×6 IMPLANT
GOWN STRL REUS W/ TWL LRG LVL3 (GOWN DISPOSABLE) IMPLANT
GOWN STRL REUS W/ TWL XL LVL3 (GOWN DISPOSABLE) IMPLANT
GOWN STRL REUS W/TWL 2XL LVL3 (GOWN DISPOSABLE) ×6 IMPLANT
GOWN STRL REUS W/TWL LRG LVL3 (GOWN DISPOSABLE) ×9
GOWN STRL REUS W/TWL XL LVL3 (GOWN DISPOSABLE)
HEMOSTAT POWDER KIT SURGIFOAM (HEMOSTASIS) ×4 IMPLANT
KIT BASIN OR (CUSTOM PROCEDURE TRAY) ×3 IMPLANT
KIT ROOM TURNOVER OR (KITS) ×3 IMPLANT
MILL MEDIUM DISP (BLADE) ×2 IMPLANT
NDL SPNL 22GX3.5 QUINCKE BK (NEEDLE) IMPLANT
NEEDLE HYPO 22GX1.5 SAFETY (NEEDLE) ×3 IMPLANT
NEEDLE SPNL 22GX3.5 QUINCKE BK (NEEDLE) ×3 IMPLANT
NS IRRIG 1000ML POUR BTL (IV SOLUTION) ×3 IMPLANT
PACK LAMINECTOMY NEURO (CUSTOM PROCEDURE TRAY) ×3 IMPLANT
PAD ARMBOARD 7.5X6 YLW CONV (MISCELLANEOUS) ×9 IMPLANT
PATTIES SURGICAL .5 X.5 (GAUZE/BANDAGES/DRESSINGS) ×4 IMPLANT
PATTIES SURGICAL .5 X1 (DISPOSABLE) ×5 IMPLANT
PATTIES SURGICAL 1X1 (DISPOSABLE) ×4 IMPLANT
PLASMABLADE 3.0S (MISCELLANEOUS) ×6
ROD TI ALLOY CVD VIT 40MM (Rod) ×4 IMPLANT
SCREW VITALITY PA 6.5X45MM (Screw) ×8 IMPLANT
SPACER ZYSTON STRT 12X25X10X8 (Spacer) ×4 IMPLANT
SPONGE LAP 4X18 X RAY DECT (DISPOSABLE) IMPLANT
SPONGE SURGIFOAM ABS GEL 100 (HEMOSTASIS) ×3 IMPLANT
SUT PROLENE 6 0 BV (SUTURE) IMPLANT
SUT VIC AB 1 CT1 18XBRD ANBCTR (SUTURE) ×1 IMPLANT
SUT VIC AB 1 CT1 8-18 (SUTURE) ×3
SUT VIC AB 2-0 CP2 18 (SUTURE) ×3 IMPLANT
SUT VIC AB 3-0 SH 8-18 (SUTURE) ×3 IMPLANT
SYR 3ML LL SCALE MARK (SYRINGE) ×12 IMPLANT
TOP CLOSURE TORQ LIMIT (Neuro Prosthesis/Implant) ×8 IMPLANT
TOWEL OR 17X24 6PK STRL BLUE (TOWEL DISPOSABLE) ×3 IMPLANT
TOWEL OR 17X26 10 PK STRL BLUE (TOWEL DISPOSABLE) ×3 IMPLANT
TRAP SPECIMEN MUCOUS 40CC (MISCELLANEOUS) ×3 IMPLANT
TRAY FOLEY W/METER SILVER 16FR (SET/KITS/TRAYS/PACK) ×3 IMPLANT
WATER STERILE IRR 1000ML POUR (IV SOLUTION) ×3 IMPLANT

## 2016-07-10 NOTE — Anesthesia Preprocedure Evaluation (Addendum)
Anesthesia Evaluation  Patient identified by MRN, date of birth, ID band Patient awake    Reviewed: Allergy & Precautions, NPO status , Patient's Chart, lab work & pertinent test results  Airway Mallampati: II  TM Distance: <3 FB Neck ROM: full    Dental  (+) Teeth Intact, Poor Dentition, Chipped, Dental Advisory Given,    Pulmonary neg pulmonary ROS,    breath sounds clear to auscultation       Cardiovascular hypertension,  Rhythm:regular Rate:Normal     Neuro/Psych Anxiety    GI/Hepatic hiatal hernia, GERD  ,  Endo/Other  Morbid obesity  Renal/GU      Musculoskeletal  (+) Arthritis ,   Abdominal   Peds  Hematology   Anesthesia Other Findings   Reproductive/Obstetrics                            Anesthesia Physical Anesthesia Plan  ASA: III  Anesthesia Plan: General   Post-op Pain Management:    Induction: Intravenous  Airway Management Planned: Oral ETT  Additional Equipment:   Intra-op Plan:   Post-operative Plan: Extubation in OR  Informed Consent: I have reviewed the patients History and Physical, chart, labs and discussed the procedure including the risks, benefits and alternatives for the proposed anesthesia with the patient or authorized representative who has indicated his/her understanding and acceptance.     Plan Discussed with: CRNA, Anesthesiologist and Surgeon  Anesthesia Plan Comments:         Anesthesia Quick Evaluation

## 2016-07-10 NOTE — Progress Notes (Signed)
Patient ID: Tracy Espinoza, female   DOB: July 16, 1946, 70 y.o.   MRN: [id number] Vital signs stable  Motor function ok Dressing dry Feels fair except for itching.

## 2016-07-10 NOTE — Anesthesia Postprocedure Evaluation (Signed)
Anesthesia Post Note  Patient: Tracy Espinoza  Procedure(s) Performed: Procedure(s) (LRB): Lumbar four-five Posterior lumbar interbody fusion (N/A)  Patient location during evaluation: PACU Anesthesia Type: General Level of consciousness: awake and alert and patient cooperative Pain management: pain level controlled Vital Signs Assessment: post-procedure vital signs reviewed and stable Respiratory status: spontaneous breathing and respiratory function stable Cardiovascular status: stable Anesthetic complications: no    Last Vitals:  Vitals:   07/10/16 1434 07/10/16 1445  BP: (!) 123/55   Pulse: 72 69  Resp: 14 14  Temp:      Last Pain:  Vitals:   07/10/16 1330  TempSrc:   PainSc: [place]

## 2016-07-10 NOTE — Progress Notes (Signed)
Pt arrived to [facility] via stretcher.  Pt alert and oriented, drowsy.  Pt in no apparent distress, resting.  Family at bedside.  VSS.  Will continue to monitor.  Cori Razor, RN

## 2016-07-10 NOTE — Op Note (Signed)
Procedure: L4-L5 decompressive laminectomy decompression of L4 and L5 nerve roots, posterior lumbar interbody arthrodesis with peek spacers local autograft and allograft, pedicle screw fixation L4-L5, posterior lateral arthrodesis L4-L5  Surgeon: Kristeen Miss M.D.  Asst.: Newman Pies M.D.  Indications: Patient is Tracy Espinoza is a 70 y.o. female who who's had significant back pain and lumbar radiculopathy for over a years period time. A lumbar MRI demonstrates advanced spondylolisthesis with high-grade canal stenosis. she was advised regarding surgical intervention.  Procedure: The patient was brought to the operating room supine on a stretcher. After the smooth induction of general endotracheal anesthesia she was turned prone and the back was prepped with alcohol and DuraPrep. The back was then draped sterilely. A midline incision was created and carried down to the lumbar dorsal fascia. A localizing radiograph identified the L4 and L5 spinous processes. A subligamentous dissection was created at L4 and L5 to expose the interlaminar space at L4 and L5 and the facet joints over the L4-L5 interspace. Laminotomies were were then created removing the entire inferior margin of the lamina of L4 including the inferior facet at the L4-L5 joint. The yellow ligament was taken up and the common dural tube was exposed along with the L4 nerve root superiorly, and the L5 nerve root inferiorly, the disc space was exposed and epidural veins in this region were cauterized and divided. The L4 nerve roots and the L5 nerve root were dissected with care taken to protect them. On the left side there was noted to be a significant cystic scar tissue around the path of the L5 nerve root as it entered into the foramen. This was dissected and removed. On the right side there was noted to be a synovial cyst from the medial aspect of the facet joint that was compressing the lateral aspect of the dura just above the takeoff for the  L5 nerve root. This was decompressed. The disc space was opened and a combination of curettes and rongeurs was used to evacuate the disc space fully. The endplates were removed using sharp curettes. An interbody spacer was placed to distract the disc space while the contralateral discectomy was performed. When the entirety of the disc was removed and the endplates were prepared final sizing of the disc space was obtained 11 mm peek spacers with 7 of lordosis and 25 mm in length were chosen and packed with autograft and allograft and placed into the interspace. The remainder of the interspace was packed with autograft and allograft. Pedicle entry sites were then chosen using fluoroscopic guidance and 6.5 x 45 mm mm screws were placed in L4 and 6.5 x 45 mm screws were placed in L5. The lateral gutters were decorticated and graft was packed in the posterolateral gutters between L4 and L5. Final radiographs were obtained after placing appropriately sized rods between the pedicle screws at L4-L5 and torquing these to the appropriate tension. The surgical site was inspected carefully to assure the L4 and L5 nerve roots were well decompressed, hemostasis was obtained, and the graft was well packed. Then the retractors were removed and the wound was closed with #1 Vicryl in the lumbar dorsal fascia 2-0 Vicryl in the subcutaneous tissue and 3-0 Vicryl subcuticularly. When he cc of half percent Marcaine was injected into the paraspinous musculature at the time of closure. Blood loss was estimated at 250 cc. The patient tolerated procedure well and was returned to the recovery room in stable condition.

## 2016-07-10 NOTE — Anesthesia Procedure Notes (Signed)
Procedure Name: Intubation Date/Time: 07/10/2016 7:42 AM Performed by: Sampson Si E Pre-anesthesia Checklist: Patient identified, Emergency Drugs available, Suction available and Patient being monitored Patient Re-evaluated:Patient Re-evaluated prior to inductionOxygen Delivery Method: Circle System Utilized Preoxygenation: Pre-oxygenation with 100% oxygen Intubation Type: IV induction Ventilation: Mask ventilation without difficulty Laryngoscope Size: Mac and 3 Grade View: Grade I Tube type: Oral Tube size: 7.0 mm Number of attempts: 1 Airway Equipment and Method: Stylet and Oral airway Placement Confirmation: ETT inserted through vocal cords under direct vision,  positive ETCO2 and breath sounds checked- equal and bilateral Secured at: 21 cm Tube secured with: Tape Dental Injury: Teeth and Oropharynx as per pre-operative assessment

## 2016-07-10 NOTE — Transfer of Care (Signed)
Immediate Anesthesia Transfer of Care Note  Patient: Tracy Espinoza  Procedure(s) Performed: Procedure(s): Lumbar four-five Posterior lumbar interbody fusion (N/A)  Patient Location: PACU  Anesthesia Type:General  Level of Consciousness: lethargic and responds to stimulation  Airway & Oxygen Therapy: Patient Spontanous Breathing and Patient connected to nasal cannula oxygen  Post-op Assessment: Report given to RN  Post vital signs: Reviewed and stable  Last Vitals:  Vitals:   07/10/16 0634  BP: 132/64  Pulse: 78  Resp: 18  Temp: 36.6 C    Last Pain:  Vitals:   07/10/16 0634  TempSrc: Oral         Complications: No apparent anesthesia complications

## 2016-07-10 NOTE — H&P (Signed)
CHIEF COMPLAINT:                                          Back pain with radiation into the right buttock and right leg since December of 2016.  HISTORY OF PRESENT ILLNESS:                     [name] is a 70 year old, right-handed, individual who tells me that she has been having problems in her back since about December of this past year. This has caused her to become progressively less active in her ability to stand and walk distances. She notes that she will get pain that radiates from the buttock down to the right thigh. Typically, does not extend below the level of the knee. The pain has been getting progressively worse. She was ultimately seen by Danna Hefty, her primary care physician. She had an MRI and was referred for some pain management techniques, which included some steroid injections. She also has had treatment with acupuncture, which did not seem to give her much effect. The steroid injections have helped. She notes that the pain as most severe would be an 8 or a 9, and this helped reduce it to a very tolerable pain level, which she characterizes as a 5 or 6. She does note, however, that any increase in her activity will tend to ratchet the pain back up to an 8 or 9 fairly quickly. She finds that sitting she can be mostly comfortable. However, she has also noticed that she has had problems with her balance and stability on her legs, and she had been involved in some physical therapy activities to help that, but this pain issue seems to be superseding this.  REVIEW OF SYSTEMS:                                    Systems review is notable for balance disturbance, abdominal pain, urinary tract infection, swelling in the feet and hands, arthritis, back pain, and leg pain on a 14-point review sheet noted in the office.   PAST MEDICAL HISTORY:                                Her Past Medical History reveals that, in general, she has had pretty good health.  . Current Medical Conditions:   She notes that she has had a hiatal hernia and has some hypertension.  . Prior Operations:  She has had a knee arthroplasty in 2004, she has had repair of a bone in her foot in 2009, and rotator cuff surgery in 2013.  . Medications and Allergies:  Current medications include amlodipine, HydroDIURIL, losartan, lorazepam, tramadol, meloxicam, and Premarin cream.  PHYSICAL EXAMINATION:                                On examination, I note that she stands straight and erect without too much difficulty. She demonstrates that she can flex fully to touching her fingertips to her toes without difficulty. She extends modestly about 10 degrees. Her motion function appears good in the major groups, including the iliopsoas, the quads, and the gastrocs. However, the tibialis anterior  on the right suggests some modest weakness at 4/5. Her gait is intact, albeit, slightly wide based.   DATA:                                                              Today in the office to further her workup, I obtained a lateral flexion and extension film. The MRI that she had performed in June of this past year demonstrates that she has a grade I spondylolisthesis at the L4-L5 level. She has evidence of synovial cysts, both on the right and on the left, with the one on the left being above the level of the foramen and not seeming to affect the nerve root much. The one on the right appears to be in the foramen and does appear to compress the traversing L5 nerve root. The plain radiographs I obtained today demonstrate that she has a spondylolisthesis in the neutral position that is approximately 4 mm, but in the flexed position extends to about 8 mm. This degree of mobility does not reduce completely with extension. The other levels appear to maintain good anatomic relationships with each other. However, the spondylolisthesis is clearly mobile.  IMPRESSION AND PLAN:                                 The patient has a mobile  spondylolisthesis at the level of L4-L5 with some synovial cysts and primarily right lumbar radicular symptoms. I indicated to Ms. Swords that ultimately I believe that she will require surgical decompression and stabilization of this joint. I initially had discussed consideration of a simple decompression. However, given the fact that she has a mobile spondylolisthesis, I do not believe that this would be the appropriate operation for her. I note that, currently, she is neurologically intact, and she has had some relief with the epidural steroid injections. However, in discussing the situation with her and with her daughter, I note that she has become much less mobile and much less active because of this pain situation, and this has restricted her activities a great deal. I believe that, ultimately, decompressing and stabilizing the L4-5 joint should allow her to restore herself to her normal level of function. We will plan on scheduling at the earliest convenience.

## 2016-07-11 MED ORDER — KETOROLAC TROMETHAMINE 15 MG/ML IJ SOLN
15.0000 mg | Freq: Four times a day (QID) | INTRAMUSCULAR | Status: AC
  Administered 2016-07-11 – 2016-07-12 (×5): 15 mg via INTRAVENOUS
  Filled 2016-07-11 (×5): qty 1

## 2016-07-11 NOTE — Care Management Note (Addendum)
Case Management Note  Patient Details  Name: Tracy Espinoza MRN: [id number] Date of Birth: 1945-10-14  Subjective/Objective:    Pt underwent:   Lumbar four-five Posterior lumbar interbody fusion. She is from home alone.            Action/Plan: PT recommending SNF. CM following for d/c disposition.  Addendum: CM spoke to Dr Ellene Route and patient has pre-chosen [facility] for her rehab. CM met with the patient and she states she has spoken to [facility] prior to admission. CM informed CSW.   Expected Discharge Date:                  Expected Discharge Plan:  Skilled Nursing Facility  In-House Referral:  Clinical Social Work  Discharge planning Services  CM Consult  Post Acute Care Choice:    Choice offered to:     DME Arranged:    DME Agency:     HH Arranged:    [place] Agency:     Status of Service:  In process, will continue to follow  If discussed at Long Length of Stay Meetings, dates discussed:    Additional Comments:  Pollie Friar, RN 07/11/2016, 3:25 PM

## 2016-07-11 NOTE — Progress Notes (Signed)
Patient ID: Tracy Espinoza, female   DOB: 06/25/1946, 70 y.o.   MRN: [id number] Vital signs ok. Mobilizing slowly Doing fair Will ask placement to see re SNF

## 2016-07-11 NOTE — NC FL2 (Signed)
[place] LEVEL OF CARE SCREENING TOOL     IDENTIFICATION  Patient Name: Tracy Espinoza Birthdate: 1946/07/06 Sex: female Admission Date (Current Location): 07/10/2016  [facility] and [place] Number:  Engineering geologist and Address:  [facility] [place], [place] [address] [place], [place] [address]      Provider Number: [id number]  Attending Physician Name and Address:  Kristeen Miss, MD  Relative Name and Phone Number:  Dickie La Daughter - [phone]     Current Level of Care: Hospital Recommended Level of Care: [place] Prior Approval Number:    Date Approved/Denied:   PASRR Number: [id number] (Eff. 07/11/16)  Discharge Plan: SNF    Current Diagnoses: Patient Active Problem List   Diagnosis Date Noted  . Spondylolisthesis at L4-L5 level 07/10/2016  . Spinal stenosis of lumbar region with radiculopathy 04/27/2016  . Anxiety 04/27/2016  . Trochanteric bursitis of right hip 10/08/2015  . Urinary tract infection 08/31/2015  . Hypertension 03/29/2015  . Hypercholesteremia 03/29/2015  . BMI 45.0-49.9, adult ([place]) 03/29/2015  . Arthralgia of hands, bilateral 03/29/2015    Orientation RESPIRATION BLADDER Height & Weight     Self, Time, Situation, Place  Normal Continent Weight: 279 lb 15.8 oz (127 kg) Height:  5\' 2"  (157.5 cm)  BEHAVIORAL SYMPTOMS/MOOD NEUROLOGICAL BOWEL NUTRITION STATUS      Continent Diet (Heart healthy)  AMBULATORY STATUS COMMUNICATION OF NEEDS Skin   Limited Assist Verbally Other (Comment), Normal (Incision back)                       Personal Care Assistance Level of Assistance  Bathing, Feeding, Dressing Bathing Assistance: Maximum assistance Feeding assistance: Independent (Modified Independent) Dressing Assistance: Maximum assistance (Min. assist upper body/max assist lower body)     Functional Limitations Info  Sight, Hearing, Speech Sight Info: Adequate Hearing Info:  Adequate Speech Info: Adequate    SPECIAL CARE FACTORS FREQUENCY  PT (By licensed PT), OT (By licensed OT)     PT Frequency: Evaluated 10/10 and a minimum of 5X per week therapy recommended OT Frequency: Evaluated 10/10 and a minimum of 2X per week therapy recommended            Contractures Contractures Info: Not present    Additional Factors Info  Code Status, Allergies Code Status Info: Full Allergies Info: Tape Band-aid Plus antibiotic, Iodinated diagnostic agents, morphine sulfate           Current Medications (07/11/2016):  This is the current hospital active medication list Current Facility-Administered Medications  Medication Dose Route Frequency Provider Last Rate Last Dose  . 0.9 %  sodium chloride infusion  250 mL Intravenous Continuous Kristeen Miss, MD      . 0.9 %  sodium chloride infusion   Intravenous Continuous Kristeen Miss, MD 100 mL/hr at 07/10/16 1723    . acetaminophen (TYLENOL) tablet 650 mg  650 mg Oral Q4H PRN Kristeen Miss, MD       Or  . acetaminophen (TYLENOL) suppository 650 mg  650 mg Rectal Q4H PRN Kristeen Miss, MD      . alum & mag hydroxide-simeth (MAALOX/MYLANTA) 200-200-20 MG/5ML suspension 30 mL  30 mL Oral Q6H PRN Kristeen Miss, MD      . amLODipine (NORVASC) tablet 5 mg  5 mg Oral Daily Kristeen Miss, MD   5 mg at 07/11/16 1017  . bisacodyl (DULCOLAX) suppository 10 mg  10 mg Rectal Daily PRN Kristeen Miss, MD      .  diphenhydrAMINE (BENADRYL) capsule 25 mg  25 mg Oral Q6H PRN Kristeen Miss, MD   25 mg at 07/10/16 2038   Or  . diphenhydrAMINE (BENADRYL) injection 25 mg  25 mg Intramuscular Q6H PRN Kristeen Miss, MD      . docusate sodium (COLACE) capsule 100 mg  100 mg Oral BID Kristeen Miss, MD   100 mg at 07/11/16 1016  . hydrochlorothiazide (HYDRODIURIL) tablet 25 mg  25 mg Oral Daily Kristeen Miss, MD   25 mg at 07/11/16 1017  . HYDROcodone-acetaminophen (NORCO/VICODIN) 5-325 MG per tablet 1-2 tablet  1-2 tablet Oral Q4H PRN Kristeen Miss, MD       . HYDROmorphone (DILAUDID) injection 0.5-1 mg  0.5-1 mg Intravenous Q2H PRN Kristeen Miss, MD   1 mg at 07/11/16 1436  . ketorolac (TORADOL) 15 MG/ML injection 15 mg  15 mg Intravenous Q6H Kristeen Miss, MD      . LORazepam (ATIVAN) tablet 1 mg  1 mg Oral Daily PRN Kristeen Miss, MD      . losartan (COZAAR) tablet 100 mg  100 mg Oral Daily Kristeen Miss, MD   100 mg at 07/11/16 1017  . menthol-cetylpyridinium (CEPACOL) lozenge 3 mg  1 lozenge Oral PRN Kristeen Miss, MD       Or  . phenol (CHLORASEPTIC) mouth spray 1 spray  1 spray Mouth/Throat PRN Kristeen Miss, MD      . methocarbamol (ROBAXIN) tablet 500 mg  500 mg Oral Q6H PRN Kristeen Miss, MD   500 mg at 07/11/16 0119   Or  . methocarbamol (ROBAXIN) 500 mg in dextrose 5 % 50 mL IVPB  500 mg Intravenous Q6H PRN Kristeen Miss, MD      . ondansetron [facility]) injection 4 mg  4 mg Intravenous Q4H PRN Kristeen Miss, MD      . oxyCODONE-acetaminophen (PERCOCET/ROXICET) 5-325 MG per tablet 1-2 tablet  1-2 tablet Oral Q4H PRN Kristeen Miss, MD   2 tablet at 07/11/16 0746  . pantoprazole (PROTONIX) EC tablet 40 mg  40 mg Oral Daily Kristeen Miss, MD   40 mg at 07/11/16 1017  . polyethylene glycol (MIRALAX / GLYCOLAX) packet 17 g  17 g Oral Daily PRN Kristeen Miss, MD      . polyvinyl alcohol (LIQUIFILM TEARS) 1.4 % ophthalmic solution 1 drop  1 drop Both Eyes Daily PRN Kristeen Miss, MD      . saccharomyces boulardii (FLORASTOR) capsule 250 mg  250 mg Oral Daily Kristeen Miss, MD   250 mg at 07/11/16 1016  . senna (SENOKOT) tablet 8.6 mg  1 tablet Oral BID Kristeen Miss, MD   8.6 mg at 07/11/16 1016  . sodium chloride flush (NS) 0.9 % injection 3 mL  3 mL Intravenous Q12H Kristeen Miss, MD   3 mL at 07/11/16 1000  . sodium chloride flush (NS) 0.9 % injection 3 mL  3 mL Intravenous PRN Kristeen Miss, MD      . sodium phosphate (FLEET) 7-19 GM/118ML enema 1 enema  1 enema Rectal Once PRN Kristeen Miss, MD      . traMADol Veatrice Bourbon) tablet 150 mg  150 mg Oral Q8H PRN  Kristeen Miss, MD      . traZODone (DESYREL) tablet 50 mg  50 mg Oral QHS Kristeen Miss, MD         Discharge Medications: Please see discharge summary for a list of discharge medications.  Relevant Imaging Results:  Relevant Lab Results:   Additional Information [phone].  Patient has  lumbar brace in hospital.  Sharlet Salina Mila Homer, LCSW

## 2016-07-11 NOTE — Evaluation (Signed)
Physical Therapy Evaluation Patient Details Name: Tracy Espinoza MRN: [id number] DOB: 1946-05-30 Today's Date: 07/11/2016   History of Present Illness  pt is a 70 y/o female with h/o HTN, TKA, rot. cuff surgery, admitted with radicular pain more on the right, s/p L45 decompressive lami and PLA.  Clinical Impression  Pt admitted with/for lumbar fusion surgery.  Pt currently limited functionally due to the problems listed. ( See problems list.)   Pt will benefit from PT to maximize function and safety in order to get ready for next venue listed below.  Pt at a min to min guard assist.      Follow Up Recommendations SNF    Equipment Recommendations  None recommended by PT    Recommendations for Other Services       Precautions / Restrictions Precautions Precautions: Back Precaution Comments: back handout provided and reviewed in detail Required Braces or Orthoses: Spinal Brace Spinal Brace: Lumbar corset;Applied in sitting position Restrictions Weight Bearing Restrictions: No      Mobility  Bed Mobility                  Transfers Overall transfer level: Needs assistance   Transfers: Sit to/from Stand Sit to Stand: Min guard            Ambulation/Gait Ambulation/Gait assistance: Min guard Ambulation Distance (Feet): 130 Feet Assistive device: Rolling walker (2 wheeled) Gait Pattern/deviations: Step-through pattern Gait velocity: slow Gait velocity interpretation: Below normal speed for age/gender General Gait Details: slow, but steady; cues to relax and progress speed.  Stairs            Wheelchair Mobility    Modified Rankin (Stroke Patients Only)       Balance Overall balance assessment: Needs assistance   Sitting balance-Leahy Scale: Good       Standing balance-Leahy Scale: Fair                               Pertinent Vitals/Pain Pain Assessment: 0-10 Pain Score: 8  Pain Location: back Pain Descriptors / Indicators:  Operative site guarding Pain Intervention(s): Premedicated before session;Monitored during session;Repositioned    Home Living Family/patient expects to be discharged to:: Skilled nursing facility Living Arrangements: Alone             Home Equipment: Walker - 2 wheels;Cane - single point Additional Comments: Requesting [facility] rehab in [place]    Prior Function Level of Independence: Independent with assistive device(s)               Hand Dominance   Dominant Hand: Right    Extremity/Trunk Assessment   Upper Extremity Assessment: Defer to OT evaluation RUE Deficits / Details: hx of R shoulder surg and numbness at the site of surg         Lower Extremity Assessment: Overall WFL for tasks assessed (some proximal weaknesses)      Cervical / Trunk Assessment: Other exceptions  Communication   Communication: No difficulties  Cognition Arousal/Alertness: Awake/alert Behavior During Therapy: WFL for tasks assessed/performed Overall Cognitive Status: Within Functional Limits for tasks assessed                      General Comments General comments (skin integrity, edema, etc.): pt /son instructed in back care/prec, log roll/transitions side to/from sit, lifting restrictions, bracing issues, and progression of activity.    Exercises     Assessment/Plan  PT Assessment Patient needs continued PT services  PT Problem List Decreased strength;Decreased activity tolerance;Decreased mobility;Decreased knowledge of use of DME;Decreased knowledge of precautions;Pain          PT Treatment Interventions Gait training;Functional mobility training;Therapeutic activities    PT Goals (Current goals can be found in the Care Plan section)  Acute Rehab PT Goals Patient Stated Goal: back independent and decreased pain. PT Goal Formulation: With patient Time For Goal Achievement: 08/01/16 Potential to Achieve Goals: Good    Frequency Min 5X/week   Barriers to  discharge        Co-evaluation               End of Session   Activity Tolerance: Patient tolerated treatment well Patient left: in chair;with call bell/phone within reach;with family/visitor present Nurse Communication: Mobility status         Time: 1015-1050 PT Time Calculation (min) (ACUTE ONLY): 35 min   Charges:   PT Evaluation $PT Eval Low Complexity: 1 Procedure PT Treatments $Gait Training: 8-22 mins   PT G Codes:        Dwana Garin, Tessie Fass 07/11/2016, 11:10 AM 07/11/2016  Donnella Sham, PT [phone]  (pager)

## 2016-07-11 NOTE — Evaluation (Signed)
Occupational Therapy Evaluation Patient Details Name: Tracy Espinoza MRN: [id number] DOB: 07/07/1946 Today's Date: 07/11/2016    History of Present Illness pt is a 70 y/o female with h/o HTN, TKA, rot. cuff surgery, admitted with radicular pain more on the right, s/p L45 decompressive lami and PLA.   Clinical Impression   Patient is s/p PLIF L4-5 decompressive laminectomy and PLA surgery resulting in functional limitations due to the deficits listed below (see OT problem list). PTA was mod I at home.  Patient will benefit from skilled OT acutely to increase independence and safety with ADLS to allow discharge SNF ( [facility] in [place] ).     Follow Up Recommendations  SNF;Supervision - Intermittent    Equipment Recommendations  3 in 1 bedside comode;Hospital bed    Recommendations for Other Services       Precautions / Restrictions Precautions Precautions: Back Precaution Comments: back handout provided and reviewed in detail Required Braces or Orthoses: Spinal Brace Spinal Brace: Lumbar corset;Applied in sitting position Restrictions Weight Bearing Restrictions: No      Mobility Bed Mobility Overal bed mobility: Needs Assistance Bed Mobility: Rolling;Supine to Sit Rolling: Mod assist   Supine to sit: Mod assist;HOB elevated     General bed mobility comments: Pt pushing up with R UE and needed use of pad to scoot buttock toward EOB  Transfers Overall transfer level: Needs assistance Equipment used: 1 person hand held assist Transfers: Sit to/from Stand Sit to Stand: Min guard         General transfer comment: able to push up with bil UE     Balance Overall balance assessment: Needs assistance   Sitting balance-Leahy Scale: Good       Standing balance-Leahy Scale: Fair                              ADL Overall ADL's : Needs assistance/impaired Eating/Feeding: Modified independent;Bed level               Upper Body  Dressing : Min guard;Sitting Upper Body Dressing Details (indicate cue type and reason): pt able to don back practice mod i. pt needed attention to brace position  Lower Body Dressing: Maximal assistance;Sit to/from stand                 General ADL Comments: pt able to complete bed mobility and sit<>stand from bed. pt able to Agricultural engineer     Praxis      Pertinent Vitals/Pain Pain Assessment: 0-10 Pain Score: 8  Pain Location: back Pain Descriptors / Indicators: Operative site guarding Pain Intervention(s): Premedicated before session;Monitored during session;Repositioned     Hand Dominance Right   Extremity/Trunk Assessment Upper Extremity Assessment Upper Extremity Assessment: Generalized weakness RUE Deficits / Details: hx of R shoulder surg and numbness at the site of surg   Lower Extremity Assessment Lower Extremity Assessment: Defer to PT evaluation   Cervical / Trunk Assessment Cervical / Trunk Assessment: Other exceptions   Communication Communication Communication: No difficulties   Cognition Arousal/Alertness: Awake/alert Behavior During Therapy: WFL for tasks assessed/performed Overall Cognitive Status: Within Functional Limits for tasks assessed                     General Comments       Exercises       Shoulder Instructions  Home Living Family/patient expects to be discharged to:: Skilled nursing facility Living Arrangements: Alone                           Home Equipment: Gilford Rile - 2 wheels;Cane - single point   Additional Comments: Requesting [facility] re[facility] in [place]      Prior Functioning/Environment Level of Independence: Independent with assistive device(s)                 OT Problem List: Decreased strength;Decreased activity tolerance;Impaired balance (sitting and/or standing);Decreased safety awareness;Decreased knowledge of use of DME or AE;Decreased knowledge of  precautions;Obesity;Pain;Impaired UE functional use   OT Treatment/Interventions: Self-care/ADL training;Therapeutic exercise;DME and/or AE instruction;Therapeutic activities;Patient/family education;Balance training    OT Goals(Current goals can be found in the care plan section) Acute Rehab OT Goals Patient Stated Goal: back independent and decreased pain. OT Goal Formulation: With patient Time For Goal Achievement: 07/25/16 Potential to Achieve Goals: Good  OT Frequency: Min 2X/week   Barriers to D/C:            Co-evaluation              End of Session Equipment Utilized During Treatment: Back brace Nurse Communication: Mobility status;Precautions  Activity Tolerance: Patient tolerated treatment well Patient left: Other (comment) (on EOB with RN providing medications)   Time: [id number] OT Time Calculation (min): 22 min Charges:  OT General Charges $OT Visit: 1 Procedure OT Evaluation $OT Eval Moderate Complexity: 1 Procedure G-Codes:    Parke Poisson B 08-08-16, 11:28 AM   Jeri Modena   OTR/L Pager: [phone] Office: [phone] .

## 2016-07-12 NOTE — Clinical Social Work Note (Signed)
Clinical Social Work Assessment  Patient Details  Name: Tracy Espinoza MRN: [id number] Date of Birth: [birth year]  Date of referral:  07/11/16               Reason for consult:  Facility Placement                Permission sought to share information with:  Family Supports Permission granted to share information::  Yes, Verbal Permission Granted  Name::     Dickie La   Agency::     Relationship::  Daughter  Contact Information:  [phone]  Housing/Transportation Living arrangements for the past 2 months:  Single Family Home Source of Information:  Patient Patient Interpreter Needed:  None Criminal Activity/Legal Involvement Pertinent to Current Situation/Hospitalization:  No - Comment as needed Significant Relationships:  Adult Children, Other Family Members Lives with:  Self Do you feel safe going back to the place where you live?  Yes (Patient expressed that she feels safe at home, but knows that she needs reha before going home as she lives alone) Need for family participation in patient care:  No (Coment) (Patient says "I have to take care of myself")  Care giving concerns:  Patient reported that she has to take care of herself and does not depend on her daughter Tracy Espinoza who lives nearby.  Patient explained to Carlton how she has made her home handicapped accessible and safer.  Social Worker assessment / plan:  CSW talked with patient regarding discharge planning and d/c to SNF. Ms. Donalds reported that she made contact with [facility] prior to her admission regarding ST rehab. Patient has 3 children, and her daughter Tracy Espinoza lives a couple of miles from her. Patient, however expressed that she does not depend on her daughter as she is married, works full-time and has 3 children to take care of. Ms. Sirmon chose and contacted [facility] as it is a couple of miles from her home.  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  Patient/Family's Understanding of and Emotional Response to Diagnosis, Current Treatment, and Prognosis:  Not discussed.  Emotional Assessment Appearance:  Appears younger than stated age Attitude/Demeanor/Rapport:  Other (Appropriate) Affect (typically observed):  Appropriate, Pleasant Orientation:  Oriented to Self, Oriented to Place, Oriented to  Time, Oriented to Situation Alcohol / Substance use:  Tobacco Use, Alcohol Use, Illicit Drugs (Patient reported that she does not smoke, has 3 glasses a wine per week and does not use illicit drugs) Psych involvement (Current and /or in the community):     Discharge Needs  Concerns to be addressed:  Discharge Planning Concerns Readmission within the last 30 days:  No Current discharge risk:  None Barriers to Discharge:  No Barriers Identified   Sable Feil, LCSW 07/12/2016, 11:03 AM

## 2016-07-12 NOTE — Consult Note (Signed)
            [facility] Primary Care Navigator  07/12/2016  Tracy Espinoza [id number] 05-24-1946 [id number]  Patient seen at the bedside to evaluate for discharge needs.  Patient shared that increased pain to back radiating to both legs led to this admission/ surgery. MD note states discharge plan is to transfer to SNF when bed is available. Patient has pre-chosen [facility] in [place] for short term rehabilitation and states she has spoken to [facility] prior to admission.  Patient confirms that primary provider is Dr. Golden Pop with [facility]. Transportation to doctor's appointment provided by self prior to admission but states has a daughter [name]) or couple of friends who can provide transportation in case it is needed.  Patient states using [facility] service for maintenance medications and uses [facility] if needed right away to obtain medications. She states managing her own medicines at home using "pill box" system weekly.  She will be the primary caregiver for herself at home after SNF discharge but daughter can assist if needed per patient.  Patient had expressed understanding to call primary care provider's office for a post discharge follow-up appointment within a week or sooner if needs arise once discharged home. Patient letter provided as a reminder.  For questions, please contact:  Edwena Felty A. Ronzell Laban, BSN, RN-BC [facility] PRIMARY CARE Navigator Cell: [phone]

## 2016-07-12 NOTE — Progress Notes (Signed)
Patient ID: Tracy Espinoza, female   DOB: 1946/01/31, 70 y.o.   MRN: [id number] Vital signs are stable Motor function appears intact Dressing is clean and dry Moving bowels and bladder Plan: Transfer to SNF when bed available

## 2016-07-12 NOTE — Clinical Social Work Placement (Signed)
   CLINICAL SOCIAL WORK PLACEMENT  NOTE  Date:  07/12/2016  Patient Details  Name: [name] MRN: [id number] Date of Birth: [birth year]  Clinical Social Work is seeking post-discharge placement for this patient at the [place] level of care (*CSW will initial, date and re-position this form in  chart as items are completed):  No (Patient has contacted and pre-selected a skilled facility)   Patient/family provided with [place] Work Department's list of facilities offering this level of care within the geographic area requested by the patient (or if unable, by the patient's family).  Yes   Patient/family informed of their freedom to choose among providers that offer the needed level of care, that participate in Medicare, Medicaid or managed care program needed by the patient, have an available bed and are willing to accept the patient.  No   Patient/family informed of [facility]'s ownership interest in [facility] and [facility], as well as of the fact that they are under no obligation to receive care at these facilities.  PASRR submitted to EDS on 07/11/16     PASRR number received on 07/11/16     Existing PASRR number confirmed on       FL2 transmitted to all facilities in geographic area requested by pt/family on       FL2 transmitted to all facilities within larger geographic area on       Patient informed that his/her managed care company has contracts with or will negotiate with certain facilities, including the following:            Patient/family informed of bed offers received.  Patient chooses bed at [facility]     Physician recommends and patient chooses bed at      Patient to be transferred to   on  .  Patient to be transferred to facility by       Patient family notified on   of transfer.  Name of family member notified:        PHYSICIAN       Additional Comment:     _______________________________________________ Tracy Feil, LCSW 07/12/2016, 11:11 AM

## 2016-07-12 NOTE — Progress Notes (Signed)
Physical Therapy Treatment Patient Details Name: Tracy Espinoza MRN: [id number] DOB: 03-29-1946 Today's Date: 07/12/2016    History of Present Illness pt is a 70 y/o female with h/o HTN, TKA, rot. cuff surgery, admitted with radicular pain more on the right, s/p L45 decompressive lami and PLA.    PT Comments    Pt is making gradual progress toward mobility goals. Continues to have the most difficulty with bed mobility and required mod A. Current plan remains appropriate.   Follow Up Recommendations  SNF     Equipment Recommendations  None recommended by PT    Recommendations for Other Services       Precautions / Restrictions Precautions Precautions: Back Precaution Comments: reviewed precautions Required Braces or Orthoses: Spinal Brace Spinal Brace: Lumbar corset;Applied in sitting position Restrictions Weight Bearing Restrictions: No    Mobility  Bed Mobility Overal bed mobility: Needs Assistance Bed Mobility: Rolling;Sidelying to Sit;Sit to Sidelying Rolling: Mod assist Sidelying to sit: Mod assist     Sit to sidelying: Mod assist General bed mobility comments: assist at trunk and bilat LE to roll with use of rail and assist to elevate trunk into sitting; cues for sequencing  Transfers Overall transfer level: Needs assistance Equipment used: Rolling walker (2 wheeled) Transfers: Sit to/from Stand Sit to Stand: Min guard         General transfer comment: cues for safe hand placement; increased time and effort; min guard for safety  Ambulation/Gait Ambulation/Gait assistance: Supervision Ambulation Distance (Feet): 160 Feet Assistive device: Rolling walker (2 wheeled) Gait Pattern/deviations: Step-through pattern;Decreased stride length Gait velocity: slow   General Gait Details: steady gait; pt able to safety increase gait speed; cues initially for posture and pt less guarded with movement with increased gait distance   Stairs             Wheelchair Mobility    Modified Rankin (Stroke Patients Only)       Balance     Sitting balance-Leahy Scale: Good       Standing balance-Leahy Scale: Fair                      Cognition Arousal/Alertness: Awake/alert Behavior During Therapy: WFL for tasks assessed/performed Overall Cognitive Status: Within Functional Limits for tasks assessed                      Exercises      General Comments General comments (skin integrity, edema, etc.): reviewed precautions, use of brace, and positioning      Pertinent Vitals/Pain Pain Assessment: 0-10 Pain Score: 9  Pain Location: back Pain Descriptors / Indicators: Aching;Operative site guarding;Sore Pain Intervention(s): Limited activity within patient's tolerance;Monitored during session;Premedicated before session;Repositioned    Home Living                      Prior Function            PT Goals (current goals can now be found in the care plan section) Acute Rehab PT Goals Patient Stated Goal: be active again Progress towards PT goals: Progressing toward goals    Frequency    Min 5X/week      PT Plan Current plan remains appropriate    Co-evaluation             End of Session Equipment Utilized During Treatment: Gait belt;Back brace Activity Tolerance: Patient tolerated treatment well Patient left: with call bell/phone within reach;with family/visitor present;in  bed;Other (comment) (bed in chair position for lunch)     Time: [id number] PT Time Calculation (min) (ACUTE ONLY): 30 min  Charges:  $Gait Training: 8-22 mins $Therapeutic Activity: 8-22 mins                    G Codes:      Salina April, PTA Pager: [phone]   07/12/2016, 12:18 PM

## 2016-07-13 NOTE — Progress Notes (Signed)
Physical Therapy Treatment Patient Details Name: Tracy Espinoza MRN: [id number] DOB: Feb 03, 1946 Today's Date: 07/13/2016    History of Present Illness pt is a 70 y/o female with h/o HTN, TKA, rot. cuff surgery, admitted with radicular pain more on the right, s/p L45 decompressive lami and PLA.    PT Comments    Patient continues to have most difficulty with bed mobility and required max A today to return to supine and maintain back precautions. Current plan remains appropriate.   Follow Up Recommendations  SNF     Equipment Recommendations  None recommended by PT    Recommendations for Other Services       Precautions / Restrictions Precautions Precautions: Back Precaution Comments: reviewed precautions Required Braces or Orthoses: Spinal Brace Spinal Brace: Lumbar corset;Applied in sitting position    Mobility  Bed Mobility Overal bed mobility: Needs Assistance Bed Mobility: Rolling;Sidelying to Sit;Sit to Sidelying Rolling: Mod assist Sidelying to sit: Mod assist     Sit to sidelying: Max assist General bed mobility comments: cues for sequencing; assist to guide trunk and elevate bilat LE into bed when returning to supine to maintain back precautions  Transfers Overall transfer level: Needs assistance Equipment used: Rolling walker (2 wheeled) Transfers: Sit to/from Stand Sit to Stand: Min guard         General transfer comment: cues for safe hand placement  Ambulation/Gait Ambulation/Gait assistance: Supervision Ambulation Distance (Feet): 150 Feet Assistive device: Rolling walker (2 wheeled) Gait Pattern/deviations: Step-through pattern;Decreased stride length Gait velocity: slow   General Gait Details: cues for posture; pt c/o nausea while ambulating and reported that once back in supine it subsided   Stairs            Wheelchair Mobility    Modified Rankin (Stroke Patients Only)       Balance     Sitting balance-Leahy Scale: Good        Standing balance-Leahy Scale: Fair                      Cognition Arousal/Alertness: Awake/alert Behavior During Therapy: WFL for tasks assessed/performed Overall Cognitive Status: Within Functional Limits for tasks assessed                      Exercises      General Comments        Pertinent Vitals/Pain Pain Assessment: 0-10 Pain Score: 8  Pain Descriptors / Indicators: Aching;Operative site guarding;Sore Pain Intervention(s): Limited activity within patient's tolerance;Monitored during session;Premedicated before session;Repositioned    Home Living                      Prior Function            PT Goals (current goals can now be found in the care plan section) Acute Rehab PT Goals Patient Stated Goal: be active again Progress towards PT goals: Progressing toward goals    Frequency    Min 5X/week      PT Plan Current plan remains appropriate    Co-evaluation             End of Session Equipment Utilized During Treatment: Gait belt;Back brace Activity Tolerance: Patient tolerated treatment well Patient left: with call bell/phone within reach;with family/visitor present;in bed;with SCD's reapplied     Time: [id number] PT Time Calculation (min) (ACUTE ONLY): 35 min  Charges:  $Gait Training: 8-22 mins $Therapeutic Activity: 8-22 mins  G Codes:      Salina April, PTA Pager: [phone]   07/13/2016, 9:35 AM

## 2016-07-13 NOTE — Progress Notes (Signed)
Patient ID: Tracy Espinoza, female   DOB: [birth year], 70 y.o.   MRN: [id number] Vital signs are stable Motor function appears intact in lower extremities Patient's mobility is significantly impaired by her morbid obesity. She is however ambulatory. She finds that getting into bed and getting out of bed isn't substantially challenging. We will see if the patient can shower today, hopeful for transfer to rehabilitation center in next day or 2.

## 2016-07-13 NOTE — Progress Notes (Signed)
Occupational Therapy Treatment Patient Details Name: Tracy RINIKER MRN: [id number] DOB: 1946/09/25 Today's Date: 07/13/2016    History of present illness pt is a 70 y/o female with h/o HTN, TKA, rot. cuff surgery, admitted with radicular pain more on the right, s/p L45 decompressive lami and PLA.   OT comments  Pt making good progress toward OT goals this session. Able to don/doff brace sitting EOB with supervision, min guard for toilet transfer, and supervision for hand washing standing at the sink. Pt able to verbally recall 3/3 back precautions and maintain throughout ADL. D/c plan remains appropriate. Will continue to follow acutely.   Follow Up Recommendations  SNF;Supervision - Intermittent    Equipment Recommendations  3 in 1 bedside comode;Hospital bed    Recommendations for Other Services      Precautions / Restrictions Precautions Precautions: Back Precaution Comments: Pt able to verbally recall 3/3 back precautions and maintain throughout ADL. Required Braces or Orthoses: Spinal Brace Spinal Brace: Lumbar corset;Applied in sitting position Restrictions Weight Bearing Restrictions: No       Mobility Bed Mobility Overal bed mobility: Needs Assistance Bed Mobility: Rolling;Sidelying to Sit;Sit to Sidelying Rolling: Min guard Sidelying to sit: Mod assist (for elevation of trunk from sidelying to sit)     Sit to sidelying: Mod assist (for LEs back to bed) General bed mobility comments: cues for sequencing and log roll technique. HOB flat with use of bed rail.  Transfers Overall transfer level: Needs assistance Equipment used: Rolling walker (2 wheeled) Transfers: Sit to/from Stand Sit to Stand: Min guard         General transfer comment: Cues for hand placement and technique.    Balance Overall balance assessment: Needs assistance Sitting-balance support: Feet supported;No upper extremity supported Sitting balance-Leahy Scale: Good     Standing balance  support: No upper extremity supported;During functional activity Standing balance-Leahy Scale: Fair Standing balance comment: Pt able to stand at sink and wash hands without UE support                   ADL Overall ADL's : Needs assistance/impaired     Grooming: Min guard;Standing;Wash/dry hands           Upper Body Dressing : Set up;Supervision/safety;Sitting Upper Body Dressing Details (indicate cue type and reason): to don/doff back brace     Toilet Transfer: Min guard;Ambulation;Regular Toilet;Grab bars;RW           Functional mobility during ADLs: Min guard;Rolling walker General ADL Comments: Reviewed back precautions in relation to functional activities, log roll technique for bed mobility.      Vision                     Perception     Praxis      Cognition   Behavior During Therapy: WFL for tasks assessed/performed Overall Cognitive Status: Within Functional Limits for tasks assessed                       Extremity/Trunk Assessment               Exercises     Shoulder Instructions       General Comments      Pertinent Vitals/ Pain       Pain Assessment: 0-10 Pain Score: 8  Pain Location: back Pain Descriptors / Indicators: Aching;Grimacing;Sore Pain Intervention(s): Monitored during session;RN gave pain meds during session  Home Living  Prior Functioning/Environment              Frequency  Min 2X/week        Progress Toward Goals  OT Goals(current goals can now be found in the care plan section)  Progress towards OT goals: Progressing toward goals  Acute Rehab OT Goals Patient Stated Goal: get back to being independent OT Goal Formulation: With patient  Plan Discharge plan remains appropriate    Co-evaluation                 End of Session Equipment Utilized During Treatment: Rolling walker;Back brace   Activity Tolerance  Patient tolerated treatment well   Patient Left in bed;with call bell/phone within reach;with bed alarm set;with SCD's reapplied   Nurse Communication Mobility status        Time: [id number] OT Time Calculation (min): 32 min  Charges: OT General Charges $OT Visit: 1 Procedure OT Treatments $Self Care/Home Management : 23-37 mins  Binnie Kand M.S., OTR/L Pager: [phone]  07/13/2016, 4:23 PM

## 2016-07-14 MED ORDER — METHOCARBAMOL 500 MG PO TABS: 500.0000 mg | ORAL_TABLET | Freq: Four times a day (QID) | ORAL | 3 refills | Status: DC | PRN

## 2016-07-14 MED ORDER — OXYCODONE-ACETAMINOPHEN 5-325 MG PO TABS: 1.0000 | ORAL_TABLET | ORAL | 0 refills | Status: DC | PRN

## 2016-07-14 NOTE — Clinical Social Work Note (Signed)
CSW talked with Tracy Espinoza at [facility] at 3:07 pm and advised her that MD had not done d/c paperwork yet, and she will be informed once d/c paperwork completed.  Janashia Parco Givens, MSW, LCSW Licensed Clinical Social Worker [place] [phone]

## 2016-07-14 NOTE — Care Management Important Message (Signed)
Important Message  Patient Details  Name: Tracy Espinoza MRN: [id number] Date of Birth: Mar 05, 1946   Medicare Important Message Given:  Yes    [name] Abena 07/14/2016, 10:20 AM

## 2016-07-14 NOTE — Discharge Summary (Addendum)
Physician Discharge Summary  Patient ID: Tracy Espinoza MRN: [id number] DOB/AGE: Jul 20, 1946 70 y.o.  Admit date: 07/10/2016 Discharge date: 07/15/2016  Admission Diagnoses:Spondylolisthesis L4-L5 with stenosis. Neurogenic claudication, lumbar radiculopathy. Morbid obesity.  Discharge Diagnoses: Spondylolisthesis L4-L5 with stenosis. Neurogenic claudication, lumbar radiculopathy. Morbid obesity.  Active Problems:   Spondylolisthesis at L4-L5 level   Discharged Condition: fair  Hospital Course: Patient is a 70 year old individual who has significant spondylolisthesis and stenosis at L4-L5. She underwent surgical decompression and arthrodesis. She tolerated the surgery well however because of her large size she has mobility issues that prevent her from functioning independently at home. At this time she requires significant help in a skilled nursing fashion and our to maintain her mobility. She is being transferred to a skilled nursing facility for this care.  Consults: None  Significant Diagnostic Studies: None  Treatments: Decompression L4-L5 with posterior lumbar interbody arthrodesis using peek spacers local autograft and allograft. Pedicle screw fixation L4-L5  Discharge Exam: Blood pressure 138/63, pulse 72, temperature 99.2 F (37.3 C), temperature source Oral, resp. rate 18, height 5\' 2"  (1.575 m), weight 127 kg (279 lb 15.8 oz), SpO2 97 %. Incision is clean and dry. Motor function is intact in lower extremities.  Dis[facility]:  [facility].  Discharge Instructions    Call MD for:  redness, tenderness, or signs of infection (pain, swelling, redness, odor or green/yellow discharge around incision site)    Complete by:  As directed    Call MD for:  severe uncontrolled pain    Complete by:  As directed    Call MD for:  temperature >100.4    Complete by:  As directed    Diet - low sodium heart healthy    Complete by:  As directed    Increase  activity slowly    Complete by:  As directed        Medication List    TAKE these medications   acetaminophen 325 MG tablet Commonly known as:  TYLENOL Take 650 mg by mouth every 6 (six) hours as needed for mild pain.   amLODipine 5 MG tablet Commonly known as:  NORVASC Take 1 tablet (5 mg total) by mouth daily.   ARTIFICIAL TEARS 0.1-0.3 % Soln Place 1 drop into both eyes daily as needed (dry eyes).   aspirin EC 81 MG tablet Take 81 mg by mouth daily.   CALCIUM 600+D 600-800 MG-UNIT Tabs Generic drug:  Calcium Carb-Cholecalciferol Take 1 tablet by mouth daily.   ciprofloxacin 250 MG tablet Commonly known as:  CIPRO Take 1 tablet (250 mg total) by mouth 2 (two) times daily. What changed:  when to take this  reasons to take this   conjugated estrogens vaginal cream Commonly known as:  PREMARIN Apply a blueberry sized amount to urethra using fingertip nightly x 2 weeks then 3 times weekly at bedtime.   CRANBERRY PO Take 1-2 capsules by mouth See admin instructions. Take 1 capsule by mouth in the morning and take 2 capsules by mouth in the evening.   hydrochlorothiazide 25 MG tablet Commonly known as:  HYDRODIURIL Take 1 tablet (25 mg total) by mouth daily.   LORazepam 1 MG tablet Commonly known as:  ATIVAN Take 1 tablet (1 mg total) by mouth daily as needed for anxiety.   losartan 100 MG tablet Commonly known as:  COZAAR Take 1 tablet (100 mg total) by mouth daily.   Melatonin 10 MG Caps Take 10 mg by mouth at bedtime.  meloxicam 15 MG tablet Commonly known as:  MOBIC Take 1 tablet (15 mg total) by mouth daily as needed for pain.   methocarbamol 500 MG tablet Commonly known as:  ROBAXIN Take 1 tablet (500 mg total) by mouth every 6 (six) hours as needed for muscle spasms.   multivitamin tablet Take 1 tablet by mouth daily.   omeprazole 40 MG capsule Commonly known as:  PRILOSEC Take 1 capsule (40 mg total) by mouth 2 (two) times daily.    oxyCODONE-acetaminophen 5-325 MG tablet Commonly known as:  PERCOCET/ROXICET Take 1-2 tablets by mouth every 4 (four) hours as needed for moderate pain.   Potassium 99 MG Tabs Take 99 mg by mouth daily.   traMADol 50 MG tablet Commonly known as:  ULTRAM Take 1 tablet (50 mg total) by mouth every 6 (six) hours as needed (1-2 Tab). What changed:  how much to take  reasons to take this   traZODone 50 MG tablet Commonly known as:  DESYREL Take 1 tablet (50 mg total) by mouth at bedtime.   Turmeric Curcumin Caps Take by mouth 2 (two) times daily.   ULTIMATE PROBIOTIC FORMULA PO Take 1 capsule by mouth daily.   vitamin C 1000 MG tablet Take 1,000 mg by mouth daily.   Vitamin D3 1000 units Caps Take 1,000 Units by mouth daily.   vitamin E 400 UNIT capsule Generic drug:  vitamin E Take 400 Units by mouth daily.      Follow-up Information    Earleen Newport, MD Follow up in 3 week(s).   Specialty:  Neurosurgery Contact information: [address] [place] [address] [phone]           Signed: Consuella Lose, Loletha Grayer 07/15/2016, 10:25 AM

## 2016-07-14 NOTE — Care Management Note (Signed)
Case Management Note  Patient Details  Name: Tracy Espinoza MRN: [id number] Date of Birth: 12-09-1945  Subjective/Objective:                    Action/Plan: Plan is for patient to discharge to [facility] when medically ready. CM following for further d/c needs.   Expected Discharge Date:                  Expected Discharge Plan:  Skilled Nursing Facility  In-House Referral:  Clinical Social Work  Discharge planning Services  CM Consult  Post Acute Care Choice:    Choice offered to:     DME Arranged:    DME Agency:     HH Arranged:    [place] Agency:     Status of Service:  In process, will continue to follow  If discussed at Long Length of Stay Meetings, dates discussed:    Additional Comments:  Pollie Friar, RN 07/14/2016, 3:39 PM

## 2016-07-14 NOTE — Progress Notes (Signed)
Patient ID: Tracy Espinoza, female   DOB: 1945/10/18, 70 y.o.   MRN: [id number] Vital signs are stable Motor function remains good Ready for transfer to rehabilitation Will plan discharge for the morning

## 2016-07-14 NOTE — Progress Notes (Signed)
Physical Therapy Treatment Patient Details Name: Tracy Espinoza MRN: [id number] DOB: 05-24-1946 Today's Date: 07/14/2016    History of Present Illness pt is a 70 y/o female with h/o HTN, TKA, rot. cuff surgery, admitted with radicular pain more on the right, s/p L45 decompressive lami and PLA.    PT Comments    Patient continues to have difficulty with bed mobility requiring mod A this session. Current plan remains appropriate.   Follow Up Recommendations  SNF     Equipment Recommendations  None recommended by PT    Recommendations for Other Services       Precautions / Restrictions Precautions Precautions: Back Precaution Comments: reviewed precautions Required Braces or Orthoses: Spinal Brace Spinal Brace: Lumbar corset;Applied in sitting position    Mobility  Bed Mobility Overal bed mobility: Needs Assistance Bed Mobility: Rolling;Sidelying to Sit;Sit to Sidelying Rolling: Mod assist Sidelying to sit: Mod assist     Sit to sidelying: Max assist General bed mobility comments: multimodal cues for sequencing/technique; assist at hips and trunk to roll and maintain back precautions and assist to elevate trunk into sitting and bring bilat LE into bed  Transfers Overall transfer level: Needs assistance Equipment used: Rolling walker (2 wheeled) Transfers: Sit to/from Stand Sit to Stand: Min guard         General transfer comment: from EOB and commode with use of grab bar; cues for safe hand placement and use of AD  Ambulation/Gait Ambulation/Gait assistance: Supervision Ambulation Distance (Feet): 125 Feet Assistive device: Rolling walker (2 wheeled)   Gait velocity: slow   General Gait Details: cues for posture; pt c/o nausea while ambulating and reported that once back in supine it subsided   Stairs            Wheelchair Mobility    Modified Rankin (Stroke Patients Only)       Balance     Sitting balance-Leahy Scale: Good       Standing  balance-Leahy Scale: Fair                      Cognition Arousal/Alertness: Awake/alert Behavior During Therapy: WFL for tasks assessed/performed Overall Cognitive Status: Within Functional Limits for tasks assessed                      Exercises      General Comments        Pertinent Vitals/Pain Pain Assessment: 0-10 Pain Score: 8  Pain Location: back Pain Descriptors / Indicators: Aching;Operative site guarding;Sore Pain Intervention(s): Limited activity within patient's tolerance;Monitored during session;Repositioned;Patient requesting pain meds-RN notified    Home Living                      Prior Function            PT Goals (current goals can now be found in the care plan section) Acute Rehab PT Goals Patient Stated Goal: be active again Progress towards PT goals: Progressing toward goals    Frequency    Min 5X/week      PT Plan Current plan remains appropriate    Co-evaluation             End of Session Equipment Utilized During Treatment: Gait belt;Back brace Activity Tolerance: Patient tolerated treatment well Patient left: with call bell/phone within reach;in bed;with SCD's reapplied     Time: 1400-1434 PT Time Calculation (min) (ACUTE ONLY): 34 min  Charges:  $Gait Training: 8-22  mins $Therapeutic Activity: 8-22 mins                    G Codes:      Salina April, PTA Pager: [phone]   07/14/2016, 2:40 PM

## 2016-07-15 DIAGNOSIS — M4326 Fusion of spine, lumbar region: Secondary | ICD-10-CM | POA: Diagnosis not present

## 2016-07-15 DIAGNOSIS — N390 Urinary tract infection, site not specified: Secondary | ICD-10-CM | POA: Diagnosis not present

## 2016-07-15 DIAGNOSIS — Z87440 Personal history of urinary (tract) infections: Secondary | ICD-10-CM | POA: Diagnosis not present

## 2016-07-15 DIAGNOSIS — M6281 Muscle weakness (generalized): Secondary | ICD-10-CM | POA: Diagnosis not present

## 2016-07-15 DIAGNOSIS — Z5189 Encounter for other specified aftercare: Secondary | ICD-10-CM | POA: Diagnosis not present

## 2016-07-15 DIAGNOSIS — F411 Generalized anxiety disorder: Secondary | ICD-10-CM | POA: Diagnosis not present

## 2016-07-15 DIAGNOSIS — K219 Gastro-esophageal reflux disease without esophagitis: Secondary | ICD-10-CM | POA: Diagnosis not present

## 2016-07-15 DIAGNOSIS — D649 Anemia, unspecified: Secondary | ICD-10-CM | POA: Diagnosis not present

## 2016-07-15 DIAGNOSIS — I10 Essential (primary) hypertension: Secondary | ICD-10-CM | POA: Diagnosis present

## 2016-07-15 DIAGNOSIS — T814XXA Infection following a procedure, initial encounter: Secondary | ICD-10-CM | POA: Diagnosis not present

## 2016-07-15 DIAGNOSIS — M48062 Spinal stenosis, lumbar region with neurogenic claudication: Secondary | ICD-10-CM | POA: Diagnosis not present

## 2016-07-15 DIAGNOSIS — M549 Dorsalgia, unspecified: Secondary | ICD-10-CM | POA: Diagnosis not present

## 2016-07-15 DIAGNOSIS — I25810 Atherosclerosis of coronary artery bypass graft(s) without angina pectoris: Secondary | ICD-10-CM | POA: Diagnosis not present

## 2016-07-15 DIAGNOSIS — M4316 Spondylolisthesis, lumbar region: Secondary | ICD-10-CM | POA: Diagnosis not present

## 2016-07-15 DIAGNOSIS — M4727 Other spondylosis with radiculopathy, lumbosacral region: Secondary | ICD-10-CM | POA: Diagnosis not present

## 2016-07-15 DIAGNOSIS — M545 Low back pain: Secondary | ICD-10-CM | POA: Diagnosis not present

## 2016-07-15 DIAGNOSIS — F419 Anxiety disorder, unspecified: Secondary | ICD-10-CM | POA: Diagnosis not present

## 2016-07-15 DIAGNOSIS — G4700 Insomnia, unspecified: Secondary | ICD-10-CM | POA: Diagnosis not present

## 2016-07-15 NOTE — Progress Notes (Signed)
Patient in her room, out of bed with assistance, ambulated to bathroom with front wheel walker, transferred to chair, will continue to monitor.

## 2016-07-15 NOTE — Progress Notes (Signed)
No issues overnight. Has appropriate back soreness, but is ambulating well.  EXAM:  BP 138/63 (BP Location: Left Arm)   Pulse 72   Temp 99.2 F (37.3 C) (Oral)   Resp 18   Ht 5\' 2"  (1.575 m)   Wt 127 kg (279 lb 15.8 oz)   SpO2 97%   BMI 51.21 kg/m   Awake, alert, oriented  Speech fluent, appropriate  CN grossly intact  5/5 BUE/BLE   IMPRESSION:  70 y.o. female s/p L4-5 fusion, doing well. Stable for transfer today  PLAN: - Transfer to SNF

## 2016-07-15 NOTE — Clinical Social Work Note (Signed)
CSW facilitated patient discharge including contacting patient family and facility to confirm patient discharge plans. Clinical information faxed to facility and family agreeable with plan. CSW arranged ambulance transport via [facility] to [facility]. RN to call report prior to discharge [phone]).  CSW will sign off for now as social work intervention is no longer needed. Please consult [place] again if new needs arise.  Dayton Scrape, [place]

## 2016-07-15 NOTE — Progress Notes (Signed)
[facility] transported patient to [facility]. Report given to nurse at [phone]

## 2016-07-15 NOTE — Clinical Social Work Placement (Signed)
   CLINICAL SOCIAL WORK PLACEMENT  NOTE  Date:  07/15/2016  Patient Details  Name: Tracy Espinoza MRN: [id number] Date of Birth: Feb 19, 1946  Clinical Social Work is seeking post-discharge placement for this patient at the [place] level of care (*CSW will initial, date and re-position this form in  chart as items are completed):  No (Patient has contacted and pre-selected a skilled facility)   Patient/family provided with [place] Work Department's list of facilities offering this level of care within the geographic area requested by the patient (or if unable, by the patient's family).  Yes   Patient/family informed of their freedom to choose among providers that offer the needed level of care, that participate in Medicare, Medicaid or managed care program needed by the patient, have an available bed and are willing to accept the patient.  No   Patient/family informed of [facility]'s ownership interest in [facility] and [facility], as well as of the fact that they are under no obligation to receive care at these facilities.  PASRR submitted to EDS on 07/11/16     PASRR number received on 07/11/16     Existing PASRR number confirmed on       FL2 transmitted to all facilities in geographic area requested by pt/family on 07/11/16     FL2 transmitted to all facilities within larger geographic area on       Patient informed that his/her managed care company has contracts with or will negotiate with certain facilities, including the following:            Patient/family informed of bed offers received.  Patient chooses bed at [facility]     Physician recommends and patient chooses bed at      Patient to be transferred to [facility] on 07/15/16.  Patient to be transferred to facility by [facility]     Patient family notified on 07/15/16 of transfer.  Name of family member notified:        PHYSICIAN Please prepare  prescriptions     Additional Comment:    _______________________________________________ Candie Chroman, LCSW 07/15/2016, 10:34 AM

## 2016-07-18 DIAGNOSIS — I10 Essential (primary) hypertension: Secondary | ICD-10-CM | POA: Diagnosis not present

## 2016-07-18 DIAGNOSIS — K219 Gastro-esophageal reflux disease without esophagitis: Secondary | ICD-10-CM | POA: Diagnosis not present

## 2016-07-18 DIAGNOSIS — M4326 Fusion of spine, lumbar region: Secondary | ICD-10-CM | POA: Diagnosis not present

## 2016-07-18 DIAGNOSIS — G4700 Insomnia, unspecified: Secondary | ICD-10-CM | POA: Diagnosis not present

## 2016-07-18 DIAGNOSIS — F419 Anxiety disorder, unspecified: Secondary | ICD-10-CM | POA: Diagnosis not present

## 2016-07-18 DIAGNOSIS — Z87440 Personal history of urinary (tract) infections: Secondary | ICD-10-CM | POA: Diagnosis not present

## 2016-07-27 DIAGNOSIS — M545 Low back pain: Secondary | ICD-10-CM | POA: Diagnosis not present

## 2016-07-27 DIAGNOSIS — F419 Anxiety disorder, unspecified: Secondary | ICD-10-CM | POA: Diagnosis not present

## 2016-07-27 DIAGNOSIS — M4316 Spondylolisthesis, lumbar region: Secondary | ICD-10-CM | POA: Diagnosis not present

## 2016-07-27 DIAGNOSIS — I10 Essential (primary) hypertension: Secondary | ICD-10-CM | POA: Diagnosis not present

## 2016-07-27 DIAGNOSIS — D649 Anemia, unspecified: Secondary | ICD-10-CM | POA: Diagnosis not present

## 2016-07-27 DIAGNOSIS — N390 Urinary tract infection, site not specified: Secondary | ICD-10-CM | POA: Diagnosis not present

## 2016-07-27 DIAGNOSIS — K219 Gastro-esophageal reflux disease without esophagitis: Secondary | ICD-10-CM | POA: Diagnosis not present

## 2016-07-31 ENCOUNTER — Other Ambulatory Visit
Admission: RE | Admit: 2016-07-31 | Discharge: 2016-07-31 | Disposition: A | Discharge status: Home / Self Care | Payer: Medicare Other | Source: Ambulatory Visit | Attending: Family Medicine | Admitting: Family Medicine

## 2016-07-31 DIAGNOSIS — T814XXA Infection following a procedure, initial encounter: Secondary | ICD-10-CM | POA: Insufficient documentation

## 2016-07-31 DIAGNOSIS — I10 Essential (primary) hypertension: Secondary | ICD-10-CM | POA: Insufficient documentation

## 2016-07-31 LAB — CBC WITH DIFFERENTIAL/PLATELET
BASOS ABS: 0.1 10*3/uL (ref 0–0.1)
Basophils Relative: 1 %
Eosinophils Absolute: 0.2 10*3/uL (ref 0–0.7)
Eosinophils Relative: 2 %
HEMATOCRIT: 33.4 % — AB (ref 35.0–47.0)
HEMOGLOBIN: 11.2 g/dL — AB (ref 12.0–16.0)
LYMPHS PCT: 26 %
Lymphs Abs: 2.9 10*3/uL (ref 1.0–3.6)
MCH: 30.1 pg (ref 26.0–34.0)
MCHC: 33.5 g/dL (ref 32.0–36.0)
MCV: 89.7 fL (ref 80.0–100.0)
MONO ABS: 1 10*3/uL — AB (ref 0.2–0.9)
Monocytes Relative: 9 %
NEUTROS ABS: 6.8 10*3/uL — AB (ref 1.4–6.5)
NEUTROS PCT: 62 %
Platelets: 281 10*3/uL (ref 150–440)
RBC: 3.72 MIL/uL — AB (ref 3.80–5.20)
RDW: 13.6 % (ref 11.5–14.5)
WBC: 10.9 10*3/uL (ref 3.6–11.0)

## 2016-08-01 DIAGNOSIS — H04129 Dry eye syndrome of unspecified lacrimal gland: Secondary | ICD-10-CM | POA: Diagnosis not present

## 2016-08-01 DIAGNOSIS — I10 Essential (primary) hypertension: Secondary | ICD-10-CM | POA: Diagnosis not present

## 2016-08-01 DIAGNOSIS — Z4789 Encounter for other orthopedic aftercare: Secondary | ICD-10-CM | POA: Diagnosis not present

## 2016-08-01 DIAGNOSIS — F411 Generalized anxiety disorder: Secondary | ICD-10-CM | POA: Diagnosis not present

## 2016-08-02 ENCOUNTER — Inpatient Hospital Stay (HOSPITAL_COMMUNITY): Payer: Medicare Other

## 2016-08-02 ENCOUNTER — Emergency Department (HOSPITAL_COMMUNITY): Payer: Medicare Other

## 2016-08-02 ENCOUNTER — Encounter (HOSPITAL_COMMUNITY): Payer: Self-pay

## 2016-08-02 ENCOUNTER — Inpatient Hospital Stay (HOSPITAL_COMMUNITY)
Admission: EM | Admit: 2016-08-02 | Discharge: 2016-08-08 | DRG: 856 | Disposition: A | Discharge status: Skilled Nursing Facility | Payer: Medicare Other | Attending: Internal Medicine | Admitting: Internal Medicine

## 2016-08-02 DIAGNOSIS — Z741 Need for assistance with personal care: Secondary | ICD-10-CM | POA: Diagnosis not present

## 2016-08-02 DIAGNOSIS — A419 Sepsis, unspecified organism: Secondary | ICD-10-CM | POA: Diagnosis present

## 2016-08-02 DIAGNOSIS — T148XXA Other injury of unspecified body region, initial encounter: Secondary | ICD-10-CM | POA: Diagnosis not present

## 2016-08-02 DIAGNOSIS — R000 Tachycardia, unspecified: Secondary | ICD-10-CM | POA: Diagnosis not present

## 2016-08-02 DIAGNOSIS — B999 Unspecified infectious disease: Secondary | ICD-10-CM | POA: Diagnosis not present

## 2016-08-02 DIAGNOSIS — Z91048 Other nonmedicinal substance allergy status: Secondary | ICD-10-CM

## 2016-08-02 DIAGNOSIS — Z6841 Body Mass Index (BMI) 40.0 and over, adult: Secondary | ICD-10-CM | POA: Diagnosis not present

## 2016-08-02 DIAGNOSIS — K219 Gastro-esophageal reflux disease without esophagitis: Secondary | ICD-10-CM | POA: Diagnosis present

## 2016-08-02 DIAGNOSIS — Z85828 Personal history of other malignant neoplasm of skin: Secondary | ICD-10-CM

## 2016-08-02 DIAGNOSIS — E860 Dehydration: Secondary | ICD-10-CM | POA: Diagnosis present

## 2016-08-02 DIAGNOSIS — R262 Difficulty in walking, not elsewhere classified: Secondary | ICD-10-CM | POA: Diagnosis not present

## 2016-08-02 DIAGNOSIS — E7800 Pure hypercholesterolemia, unspecified: Secondary | ICD-10-CM | POA: Diagnosis not present

## 2016-08-02 DIAGNOSIS — L089 Local infection of the skin and subcutaneous tissue, unspecified: Secondary | ICD-10-CM | POA: Diagnosis present

## 2016-08-02 DIAGNOSIS — Y838 Other surgical procedures as the cause of abnormal reaction of the patient, or of later complication, without mention of misadventure at the time of the procedure: Secondary | ICD-10-CM | POA: Diagnosis present

## 2016-08-02 DIAGNOSIS — I340 Nonrheumatic mitral (valve) insufficiency: Secondary | ICD-10-CM | POA: Diagnosis present

## 2016-08-02 DIAGNOSIS — Z85841 Personal history of malignant neoplasm of brain: Secondary | ICD-10-CM | POA: Diagnosis not present

## 2016-08-02 DIAGNOSIS — B9562 Methicillin resistant Staphylococcus aureus infection as the cause of diseases classified elsewhere: Secondary | ICD-10-CM | POA: Diagnosis not present

## 2016-08-02 DIAGNOSIS — A4101 Sepsis due to Methicillin susceptible Staphylococcus aureus: Secondary | ICD-10-CM | POA: Diagnosis not present

## 2016-08-02 DIAGNOSIS — I959 Hypotension, unspecified: Secondary | ICD-10-CM | POA: Diagnosis present

## 2016-08-02 DIAGNOSIS — S31000A Unspecified open wound of lower back and pelvis without penetration into retroperitoneum, initial encounter: Secondary | ICD-10-CM | POA: Diagnosis not present

## 2016-08-02 DIAGNOSIS — Z9889 Other specified postprocedural states: Secondary | ICD-10-CM | POA: Diagnosis not present

## 2016-08-02 DIAGNOSIS — M4646 Discitis, unspecified, lumbar region: Secondary | ICD-10-CM | POA: Diagnosis not present

## 2016-08-02 DIAGNOSIS — R52 Pain, unspecified: Secondary | ICD-10-CM

## 2016-08-02 DIAGNOSIS — R509 Fever, unspecified: Secondary | ICD-10-CM | POA: Diagnosis not present

## 2016-08-02 DIAGNOSIS — T814XXA Infection following a procedure, initial encounter: Principal | ICD-10-CM | POA: Diagnosis present

## 2016-08-02 DIAGNOSIS — Z9689 Presence of other specified functional implants: Secondary | ICD-10-CM | POA: Diagnosis not present

## 2016-08-02 DIAGNOSIS — A4902 Methicillin resistant Staphylococcus aureus infection, unspecified site: Secondary | ICD-10-CM | POA: Diagnosis not present

## 2016-08-02 DIAGNOSIS — I10 Essential (primary) hypertension: Secondary | ICD-10-CM | POA: Diagnosis present

## 2016-08-02 DIAGNOSIS — M545 Low back pain: Secondary | ICD-10-CM | POA: Diagnosis not present

## 2016-08-02 DIAGNOSIS — M6259 Muscle wasting and atrophy, not elsewhere classified, multiple sites: Secondary | ICD-10-CM | POA: Diagnosis not present

## 2016-08-02 DIAGNOSIS — M549 Dorsalgia, unspecified: Secondary | ICD-10-CM | POA: Diagnosis not present

## 2016-08-02 DIAGNOSIS — Z791 Long term (current) use of non-steroidal anti-inflammatories (NSAID): Secondary | ICD-10-CM

## 2016-08-02 DIAGNOSIS — D62 Acute posthemorrhagic anemia: Secondary | ICD-10-CM | POA: Diagnosis present

## 2016-08-02 DIAGNOSIS — M479 Spondylosis, unspecified: Secondary | ICD-10-CM | POA: Diagnosis not present

## 2016-08-02 DIAGNOSIS — D649 Anemia, unspecified: Secondary | ICD-10-CM | POA: Diagnosis not present

## 2016-08-02 DIAGNOSIS — Z7982 Long term (current) use of aspirin: Secondary | ICD-10-CM | POA: Diagnosis not present

## 2016-08-02 DIAGNOSIS — R918 Other nonspecific abnormal finding of lung field: Secondary | ICD-10-CM | POA: Diagnosis not present

## 2016-08-02 DIAGNOSIS — Z91041 Radiographic dye allergy status: Secondary | ICD-10-CM

## 2016-08-02 DIAGNOSIS — R7881 Bacteremia: Secondary | ICD-10-CM | POA: Diagnosis not present

## 2016-08-02 DIAGNOSIS — Z820 Family history of epilepsy and other diseases of the nervous system: Secondary | ICD-10-CM

## 2016-08-02 DIAGNOSIS — Z823 Family history of stroke: Secondary | ICD-10-CM | POA: Diagnosis not present

## 2016-08-02 DIAGNOSIS — Z8249 Family history of ischemic heart disease and other diseases of the circulatory system: Secondary | ICD-10-CM

## 2016-08-02 DIAGNOSIS — Z452 Encounter for adjustment and management of vascular access device: Secondary | ICD-10-CM | POA: Diagnosis not present

## 2016-08-02 DIAGNOSIS — T8132XA Disruption of internal operation (surgical) wound, not elsewhere classified, initial encounter: Secondary | ICD-10-CM | POA: Diagnosis present

## 2016-08-02 DIAGNOSIS — M5126 Other intervertebral disc displacement, lumbar region: Secondary | ICD-10-CM | POA: Diagnosis not present

## 2016-08-02 DIAGNOSIS — A4102 Sepsis due to Methicillin resistant Staphylococcus aureus: Secondary | ICD-10-CM | POA: Diagnosis present

## 2016-08-02 DIAGNOSIS — I248 Other forms of acute ischemic heart disease: Secondary | ICD-10-CM | POA: Diagnosis present

## 2016-08-02 DIAGNOSIS — N179 Acute kidney failure, unspecified: Secondary | ICD-10-CM | POA: Diagnosis present

## 2016-08-02 DIAGNOSIS — S31000D Unspecified open wound of lower back and pelvis without penetration into retroperitoneum, subsequent encounter: Secondary | ICD-10-CM | POA: Diagnosis not present

## 2016-08-02 DIAGNOSIS — F419 Anxiety disorder, unspecified: Secondary | ICD-10-CM | POA: Diagnosis present

## 2016-08-02 DIAGNOSIS — Z96653 Presence of artificial knee joint, bilateral: Secondary | ICD-10-CM | POA: Diagnosis present

## 2016-08-02 DIAGNOSIS — N390 Urinary tract infection, site not specified: Secondary | ICD-10-CM | POA: Diagnosis not present

## 2016-08-02 DIAGNOSIS — Z885 Allergy status to narcotic agent status: Secondary | ICD-10-CM

## 2016-08-02 DIAGNOSIS — R748 Abnormal levels of other serum enzymes: Secondary | ICD-10-CM | POA: Diagnosis not present

## 2016-08-02 DIAGNOSIS — R9431 Abnormal electrocardiogram [ECG] [EKG]: Secondary | ICD-10-CM | POA: Diagnosis not present

## 2016-08-02 DIAGNOSIS — B9561 Methicillin susceptible Staphylococcus aureus infection as the cause of diseases classified elsewhere: Secondary | ICD-10-CM | POA: Diagnosis not present

## 2016-08-02 DIAGNOSIS — G8918 Other acute postprocedural pain: Secondary | ICD-10-CM | POA: Diagnosis not present

## 2016-08-02 LAB — COMPREHENSIVE METABOLIC PANEL
ALBUMIN: 2.7 g/dL — AB (ref 3.5–5.0)
ALT: 14 U/L (ref 14–54)
AST: 31 U/L (ref 15–41)
Alkaline Phosphatase: 85 U/L (ref 38–126)
Anion gap: 12 (ref 5–15)
BUN: 33 mg/dL — ABNORMAL HIGH (ref 6–20)
CHLORIDE: 96 mmol/L — AB (ref 101–111)
CO2: 26 mmol/L (ref 22–32)
CREATININE: 1.13 mg/dL — AB (ref 0.44–1.00)
Calcium: 8.4 mg/dL — ABNORMAL LOW (ref 8.9–10.3)
GFR, EST AFRICAN AMERICAN: 55 mL/min — AB (ref >60)
GFR, EST NON AFRICAN AMERICAN: 48 mL/min — AB (ref >60)
Glucose, Bld: 192 mg/dL — ABNORMAL HIGH (ref 65–99)
POTASSIUM: 3.4 mmol/L — AB (ref 3.5–5.1)
SODIUM: 134 mmol/L — AB (ref 135–145)
Total Bilirubin: 1 mg/dL (ref 0.3–1.2)
Total Protein: 6.1 g/dL — ABNORMAL LOW (ref 6.5–8.1)

## 2016-08-02 LAB — CBC WITH DIFFERENTIAL/PLATELET
Basophils Absolute: 0 10*3/uL (ref 0.0–0.1)
Basophils Relative: 0 %
EOS ABS: 0 10*3/uL (ref 0.0–0.7)
Eosinophils Relative: 0 %
HCT: 32.3 % — ABNORMAL LOW (ref 36.0–46.0)
HEMOGLOBIN: 10.2 g/dL — AB (ref 12.0–15.0)
LYMPHS ABS: 1.9 10*3/uL (ref 0.7–4.0)
LYMPHS PCT: 13 %
MCH: 29.1 pg (ref 26.0–34.0)
MCHC: 31.6 g/dL (ref 30.0–36.0)
MCV: 92.3 fL (ref 78.0–100.0)
Monocytes Absolute: 1.5 10*3/uL — ABNORMAL HIGH (ref 0.1–1.0)
Monocytes Relative: 10 %
NEUTROS PCT: 77 %
Neutro Abs: 11.5 10*3/uL — ABNORMAL HIGH (ref 1.7–7.7)
Platelets: 262 10*3/uL (ref 150–400)
RBC: 3.5 MIL/uL — AB (ref 3.87–5.11)
RDW: 13.8 % (ref 11.5–15.5)
WBC: 14.9 10*3/uL — AB (ref 4.0–10.5)

## 2016-08-02 LAB — URINALYSIS, ROUTINE W REFLEX MICROSCOPIC
Bilirubin Urine: NEGATIVE
Glucose, UA: NEGATIVE mg/dL
HGB URINE DIPSTICK: NEGATIVE
Ketones, ur: NEGATIVE mg/dL
Nitrite: NEGATIVE
Protein, ur: NEGATIVE mg/dL
SPECIFIC GRAVITY, URINE: 1.016 (ref 1.005–1.030)
pH: 6 (ref 5.0–8.0)

## 2016-08-02 LAB — I-STAT CG4 LACTIC ACID, ED
LACTIC ACID, VENOUS: 1.15 mmol/L (ref 0.5–1.9)
Lactic Acid, Venous: 2.29 mmol/L (ref 0.5–1.9)

## 2016-08-02 LAB — URINE MICROSCOPIC-ADD ON: RBC / HPF: NONE SEEN RBC/hpf (ref 0–5)

## 2016-08-02 MED ORDER — DIPHENHYDRAMINE HCL 50 MG/ML IJ SOLN
25.0000 mg | Freq: Once | INTRAMUSCULAR | Status: AC
  Administered 2016-08-02: 25 mg via INTRAVENOUS

## 2016-08-02 MED ORDER — VANCOMYCIN HCL IN DEXTROSE 1-5 GM/200ML-% IV SOLN
1000.0000 mg | Freq: Once | INTRAVENOUS | Status: AC
  Administered 2016-08-02: 1000 mg via INTRAVENOUS
  Filled 2016-08-02: qty 200

## 2016-08-02 MED ORDER — SODIUM CHLORIDE 0.9 % IV BOLUS (SEPSIS)
1000.0000 mL | Freq: Once | INTRAVENOUS | Status: AC
  Administered 2016-08-02: 1000 mL via INTRAVENOUS

## 2016-08-02 MED ORDER — ACETAMINOPHEN 500 MG PO TABS
1000.0000 mg | ORAL_TABLET | Freq: Once | ORAL | Status: AC
  Administered 2016-08-02: 1000 mg via ORAL
  Filled 2016-08-02: qty 2

## 2016-08-02 MED ORDER — PIPERACILLIN-TAZOBACTAM 3.375 G IVPB
3.3750 g | Freq: Once | INTRAVENOUS | Status: AC
  Administered 2016-08-02: 3.375 g via INTRAVENOUS
  Filled 2016-08-02: qty 50

## 2016-08-02 MED ORDER — DIPHENHYDRAMINE HCL 50 MG/ML IJ SOLN
INTRAMUSCULAR | Status: AC
  Filled 2016-08-02: qty 1

## 2016-08-02 MED ORDER — SODIUM CHLORIDE 0.9 % IV BOLUS (SEPSIS)
2000.0000 mL | Freq: Once | INTRAVENOUS | Status: AC
  Administered 2016-08-03: 1000 mL via INTRAVENOUS

## 2016-08-02 NOTE — ED Provider Notes (Signed)
[facility] DEPT Provider Note   CSN: [id number] Arrival date & time: 08/02/16  1846     History   Chief Complaint Chief Complaint  Patient presents with  . Fever    HPI Tracy Espinoza is a 70 y.o. female.  Patient complains of fever chills hurting all over for 1-2 days   The history is provided by the patient. No language interpreter was used.  Fever   This is a new problem. The current episode started 12 to 24 hours ago. The problem occurs constantly. The problem has not changed since onset.Her temperature was unmeasured prior to arrival. Pertinent negatives include no chest pain, no diarrhea, no congestion, no headaches and no cough. She has tried nothing for the symptoms.    Past Medical History:  Diagnosis Date  . Anxiety   . Arthritis   . Cancer ([place])    BASAL CELL SKIN CANCERS REMOVED  . GERD (gastroesophageal reflux disease)   . History of hiatal hernia   . Hypertension     Patient Active Problem List   Diagnosis Date Noted  . Sepsis ([place]) 08/02/2016  . Spondylolisthesis at L4-L5 level 07/10/2016  . Spinal stenosis of lumbar region with radiculopathy 04/27/2016  . Anxiety 04/27/2016  . Trochanteric bursitis of right hip 10/08/2015  . Urinary tract infection 08/31/2015  . Hypertension 03/29/2015  . Hypercholesteremia 03/29/2015  . BMI 45.0-49.9, adult ([place]) 03/29/2015  . Arthralgia of hands, bilateral 03/29/2015    Past Surgical History:  Procedure Laterality Date  . ABDOMINAL HYSTERECTOMY    . APPENDECTOMY    . CHOLECYSTECTOMY    . FRACTURE SURGERY     BONE ON SIDE OF RIGHT FOOT  . HERNIA REPAIR     UMBILICAL X 2  . JOINT REPLACEMENT Bilateral    knees  . REPAIR OF LEFT URETER Left 40 YRS AGO  . ROTATOR CUFF REPAIR  2014  . TONSILLECTOMY    . TUBAL LIGATION      OB History    No data available       Home Medications    Prior to Admission medications   Medication Sig Start Date End Date Taking? Authorizing Provider  acetaminophen  (TYLENOL) 325 MG tablet Take 650 mg by mouth every 6 (six) hours as needed for mild pain.   Yes Historical Provider, MD  acidophilus (RISAQUAD) CAPS capsule Take 1 capsule by mouth daily.   Yes Historical Provider, MD  amLODipine (NORVASC) 5 MG tablet Take 1 tablet (5 mg total) by mouth daily. 04/27/16  Yes Guadalupe Maple, MD  ARTIFICIAL TEARS 0.1-0.3 % SOLN Place 1 drop into both eyes daily as needed (dry eyes).   Yes Historical Provider, MD  Ascorbic Acid (VITAMIN C) 1000 MG tablet Take 1,000 mg by mouth daily.   Yes Historical Provider, MD  aspirin EC 81 MG tablet Take 81 mg by mouth daily.   Yes Historical Provider, MD  Calcium Carb-Cholecalciferol (CALCIUM 600+D) 600-800 MG-UNIT TABS Take 1 tablet by mouth daily.    Yes Historical Provider, MD  Cholecalciferol (D3-1000) 1000 units capsule Take 1,000 Units by mouth daily.   Yes Historical Provider, MD  ciprofloxacin (CIPRO) 500 MG tablet Take 500 mg by mouth 2 (two) times daily. Started 10/24, for 14 days ending 11/7   Yes Historical Provider, MD  conjugated estrogens (PREMARIN) vaginal cream Apply a blueberry sized amount to urethra using fingertip nightly x 2 weeks then 3 times weekly at bedtime. 09/15/15  Yes Roda Shutters, FNP  CRANBERRY PO Take 1-2 capsules by mouth See admin instructions. Take 1 capsule by mouth in the morning and take 2 capsules by mouth in the evening.   Yes Historical Provider, MD  hydrochlorothiazide (HYDRODIURIL) 25 MG tablet Take 1 tablet (25 mg total) by mouth daily. 04/27/16  Yes Guadalupe Maple, MD  LORazepam (ATIVAN) 1 MG tablet Take 1 tablet (1 mg total) by mouth daily as needed for anxiety. 07/04/16  Yes Guadalupe Maple, MD  losartan (COZAAR) 100 MG tablet Take 1 tablet (100 mg total) by mouth daily. 04/27/16  Yes Guadalupe Maple, MD  magnesium oxide (MAG-OX) 400 MG tablet Take 400 mg by mouth 2 (two) times daily.   Yes Historical Provider, MD  Melatonin 10 MG CAPS Take 10 mg by mouth at bedtime.    Yes  Historical Provider, MD  meloxicam (MOBIC) 15 MG tablet Take 1 tablet (15 mg total) by mouth daily as needed for pain. Patient taking differently: Take 15 mg by mouth daily.  04/27/16  Yes Guadalupe Maple, MD  methocarbamol (ROBAXIN) 500 MG tablet Take 1 tablet (500 mg total) by mouth every 6 (six) hours as needed for muscle spasms. Patient taking differently: Take 500 mg by mouth every 6 (six) hours.  07/14/16  Yes Kristeen Miss, MD  Misc Natural Products (TURMERIC CURCUMIN) CAPS Take 1 capsule by mouth 2 (two) times daily.    Yes Historical Provider, MD  Multiple Vitamin (MULTIVITAMIN) tablet Take 1 tablet by mouth daily.   Yes Historical Provider, MD  omeprazole (PRILOSEC) 40 MG capsule Take 1 capsule (40 mg total) by mouth 2 (two) times daily. 04/27/16  Yes Guadalupe Maple, MD  oxycodone (OXY-IR) 5 MG capsule Take 10 mg by mouth every 4 (four) hours.   Yes Historical Provider, MD  oxyCODONE-acetaminophen (PERCOCET/ROXICET) 5-325 MG tablet Take 1-2 tablets by mouth every 4 (four) hours as needed for moderate pain. Patient taking differently: Take 2 tablets by mouth every 4 (four) hours as needed for moderate pain.  07/14/16  Yes Kristeen Miss, MD  polyvinyl alcohol (LIQUIFILM TEARS) 1.4 % ophthalmic solution 1 drop as needed for dry eyes.   Yes Historical Provider, MD  Potassium 99 MG TABS Take 99 mg by mouth daily.    Yes Historical Provider, MD  traMADol (ULTRAM) 50 MG tablet Take 1 tablet (50 mg total) by mouth every 6 (six) hours as needed (1-2 Tab). Patient taking differently: Take 100 mg by mouth every 6 (six) hours as needed for moderate pain.  02/29/16  Yes Guadalupe Maple, MD  traZODone (DESYREL) 50 MG tablet Take 1 tablet (50 mg total) by mouth at bedtime. 04/27/16  Yes Guadalupe Maple, MD  Turmeric Curcumin 500 MG CAPS Take 500 mg by mouth 2 (two) times daily.   Yes Historical Provider, MD  vitamin E (VITAMIN E) 400 UNIT capsule Take 400 Units by mouth daily.   Yes Historical Provider, MD     Family History Family History  Problem Relation Age of Onset  . Hypertension Mother   . Stroke Mother   . Alzheimer's disease Mother   . Cancer Father     brain  . Hypertension Father   . Stroke Father     Social History Social History  Substance Use Topics  . Smoking status: Never Smoker  . Smokeless tobacco: Never Used  . Alcohol use 1.8 oz/week    3 Glasses of wine per week     Allergies   Tape; Band-aid  plus antibiotic [bacitracin-polymyxin b]; Iodinated diagnostic agents; and Morphine sulfate   Review of Systems Review of Systems  Constitutional: Positive for fever. Negative for appetite change and fatigue.  HENT: Negative for congestion, ear discharge and sinus pressure.   Eyes: Negative for discharge.  Respiratory: Negative for cough.   Cardiovascular: Negative for chest pain.  Gastrointestinal: Negative for abdominal pain and diarrhea.  Genitourinary: Negative for frequency and hematuria.  Musculoskeletal: Negative for back pain.  Skin: Negative for rash.  Neurological: Negative for seizures and headaches.  Psychiatric/Behavioral: Negative for hallucinations.     Physical Exam Updated Vital Signs BP (!) 99/42   Pulse 79   Temp 100.4 F (38 C) (Oral)   Resp 22   SpO2 97%   Physical Exam  Constitutional: She is oriented to person, place, and time. She appears well-developed.  HENT:  Head: Normocephalic.  Eyes: Conjunctivae and EOM are normal. No scleral icterus.  Neck: Neck supple. No thyromegaly present.  Cardiovascular: Normal rate and regular rhythm.  Exam reveals no gallop and no friction rub.   No murmur heard. Pulmonary/Chest: No stridor. She has no wheezes. She has no rales. She exhibits no tenderness.  Abdominal: She exhibits no distension. There is no tenderness. There is no rebound.  Musculoskeletal: Normal range of motion. She exhibits no edema.  Lymphadenopathy:    She has no cervical adenopathy.  Neurological: She is oriented to  person, place, and time. She exhibits normal muscle tone. Coordination normal.  Skin: No rash noted. No erythema.  Patient has an incision where she had surgery on L4-L5. The incision is partially open but shows no signs of infection  Psychiatric: She has a normal mood and affect. Her behavior is normal.     ED Treatments / Results  Labs (all labs ordered are listed, but only abnormal results are displayed) Labs Reviewed  COMPREHENSIVE METABOLIC PANEL - Abnormal; Notable for the following:       Result Value   Sodium 134 (*)    Potassium 3.4 (*)    Chloride 96 (*)    Glucose, Bld 192 (*)    BUN 33 (*)    Creatinine, Ser 1.13 (*)    Calcium 8.4 (*)    Total Protein 6.1 (*)    Albumin 2.7 (*)    GFR calc non Af Amer 48 (*)    GFR calc Af Amer 55 (*)    All other components within normal limits  CBC WITH DIFFERENTIAL/PLATELET - Abnormal; Notable for the following:    WBC 14.9 (*)    RBC 3.50 (*)    Hemoglobin 10.2 (*)    HCT 32.3 (*)    Neutro Abs 11.5 (*)    Monocytes Absolute 1.5 (*)    All other components within normal limits  URINALYSIS, ROUTINE W REFLEX MICROSCOPIC (NOT AT [facility]) - Abnormal; Notable for the following:    Color, Urine AMBER (*)    Leukocytes, UA TRACE (*)    All other components within normal limits  URINE MICROSCOPIC-ADD ON - Abnormal; Notable for the following:    Squamous Epithelial / LPF 0-5 (*)    Bacteria, UA RARE (*)    Casts HYALINE CASTS (*)    All other components within normal limits  I-STAT CG4 LACTIC ACID, ED - Abnormal; Notable for the following:    Lactic Acid, Venous 2.29 (*)    All other components within normal limits  CULTURE, BLOOD (ROUTINE X 2)  CULTURE, BLOOD (ROUTINE X 2)  URINE CULTURE  I-STAT CG4 LACTIC ACID, ED    EKG  EKG Interpretation None       Radiology Dg Chest Port 1 View  Result Date: 08/02/2016 CLINICAL DATA:  Postop fever, hypoxia, and tachypnea. Recent back surgery. EXAM: PORTABLE CHEST 1 VIEW  COMPARISON:  05/03/2011 FINDINGS: Mild cardiomegaly and tortuosity of thoracic aorta again noted. Aortic atherosclerosis. Both lungs are clear. No evidence of pneumothorax or pleural effusion. IMPRESSION: Stable mild cardiomegaly and aortic atherosclerosis. No active lung disease. Electronically Signed   By: Earle Gell M.D.   On: 08/02/2016 20:20    Procedures Procedures (including critical care time)  Medications Ordered in ED Medications  diphenhydrAMINE (BENADRYL) 50 MG/ML injection (not administered)  sodium chloride 0.9 % bolus 2,000 mL (not administered)  sodium chloride 0.9 % bolus 1,000 mL (0 mLs Intravenous Stopped 08/02/16 2253)  vancomycin (VANCOCIN) IVPB 1000 mg/200 mL premix (0 mg Intravenous Stopped 08/02/16 2248)  piperacillin-tazobactam (ZOSYN) IVPB 3.375 g (0 g Intravenous Stopped 08/02/16 2249)  acetaminophen (TYLENOL) tablet 1,000 mg (1,000 mg Oral Given 08/02/16 2027)  diphenhydrAMINE (BENADRYL) injection 25 mg (25 mg Intravenous Given 08/02/16 2249)  sodium chloride 0.9 % bolus 1,000 mL (1,000 mLs Intravenous New Bag/Given 08/02/16 2253)     Initial Impression / Assessment and Plan / ED Course  I have reviewed the triage vital signs and the nursing notes.  Pertinent labs & imaging results that were available during my care of the patient were reviewed by me and considered in my medical decision making (see chart for details).  Clinical Course  CRITICAL CARE Performed by: Jeyren Danowski L Total critical care time:40 minutes Critical care time was exclusive of separately billable procedures and treating other patients. Critical care was necessary to treat or prevent imminent or life-threatening deterioration. Critical care was time spent personally by me on the following activities: development of treatment plan with patient and/or surrogate as well as nursing, discussions with consultants, evaluation of patient's response to treatment, examination of patient, obtaining  history from patient or surrogate, ordering and performing treatments and interventions, ordering and review of laboratory studies, ordering and review of radiographic studies, pulse oximetry and re-evaluation of patient's condition.  Patient will be admitted for fever and possible sepsis. She will get an MRI to rule out infection around the surgery site   Final Clinical Impressions(s) / ED Diagnoses   Final diagnoses:  Pain  Sepsis associated hypotension [facility])    New Prescriptions New Prescriptions   No medications on file     Milton Ferguson, MD 08/02/16 2308

## 2016-08-02 NOTE — ED Triage Notes (Signed)
Pt. Had surgery on the 9th and spiked a fever this AM with low O2 sats, increased respirations.

## 2016-08-02 NOTE — ED Notes (Addendum)
Assumed care of pt at approx 7pm.  IV access established-labs and 1 set of BC obtained, EKG done, In and out cath done, fluids started and antibiotics started. POC discussed with pt.  Pt assisted to used the bedpan.

## 2016-08-03 ENCOUNTER — Encounter (HOSPITAL_COMMUNITY)
Admission: EM | Disposition: A | Discharge status: Skilled Nursing Facility | Payer: Self-pay | Source: Home / Self Care | Attending: Internal Medicine

## 2016-08-03 ENCOUNTER — Inpatient Hospital Stay (HOSPITAL_COMMUNITY): Payer: Medicare Other | Admitting: Anesthesiology

## 2016-08-03 ENCOUNTER — Encounter (HOSPITAL_COMMUNITY): Payer: Self-pay | Admitting: Internal Medicine

## 2016-08-03 DIAGNOSIS — M4646 Discitis, unspecified, lumbar region: Secondary | ICD-10-CM

## 2016-08-03 DIAGNOSIS — D649 Anemia, unspecified: Secondary | ICD-10-CM | POA: Diagnosis present

## 2016-08-03 DIAGNOSIS — Z881 Allergy status to other antibiotic agents status: Secondary | ICD-10-CM

## 2016-08-03 DIAGNOSIS — T148XXA Other injury of unspecified body region, initial encounter: Secondary | ICD-10-CM

## 2016-08-03 DIAGNOSIS — Z91048 Other nonmedicinal substance allergy status: Secondary | ICD-10-CM

## 2016-08-03 DIAGNOSIS — Z8249 Family history of ischemic heart disease and other diseases of the circulatory system: Secondary | ICD-10-CM

## 2016-08-03 DIAGNOSIS — N179 Acute kidney failure, unspecified: Secondary | ICD-10-CM | POA: Diagnosis present

## 2016-08-03 DIAGNOSIS — Z885 Allergy status to narcotic agent status: Secondary | ICD-10-CM

## 2016-08-03 DIAGNOSIS — A4902 Methicillin resistant Staphylococcus aureus infection, unspecified site: Secondary | ICD-10-CM

## 2016-08-03 DIAGNOSIS — A419 Sepsis, unspecified organism: Secondary | ICD-10-CM

## 2016-08-03 DIAGNOSIS — L089 Local infection of the skin and subcutaneous tissue, unspecified: Secondary | ICD-10-CM | POA: Diagnosis present

## 2016-08-03 DIAGNOSIS — Z9689 Presence of other specified functional implants: Secondary | ICD-10-CM

## 2016-08-03 DIAGNOSIS — Z808 Family history of malignant neoplasm of other organs or systems: Secondary | ICD-10-CM

## 2016-08-03 DIAGNOSIS — Z91041 Radiographic dye allergy status: Secondary | ICD-10-CM

## 2016-08-03 DIAGNOSIS — Z9889 Other specified postprocedural states: Secondary | ICD-10-CM

## 2016-08-03 DIAGNOSIS — Z818 Family history of other mental and behavioral disorders: Secondary | ICD-10-CM

## 2016-08-03 HISTORY — PX: LUMBAR WOUND DEBRIDEMENT: SHX1988

## 2016-08-03 LAB — BLOOD CULTURE ID PANEL (REFLEXED)

## 2016-08-03 LAB — COMPREHENSIVE METABOLIC PANEL
ALBUMIN: 2.1 g/dL — AB (ref 3.5–5.0)
ALT: 15 U/L (ref 14–54)
ANION GAP: 8 (ref 5–15)
AST: 30 U/L (ref 15–41)
Alkaline Phosphatase: 73 U/L (ref 38–126)
BUN: 28 mg/dL — ABNORMAL HIGH (ref 6–20)
CALCIUM: 7.4 mg/dL — AB (ref 8.9–10.3)
CHLORIDE: 101 mmol/L (ref 101–111)
CO2: 26 mmol/L (ref 22–32)
Creatinine, Ser: 0.94 mg/dL (ref 0.44–1.00)
GFR calc non Af Amer: 60 mL/min — ABNORMAL LOW (ref >60)
GLUCOSE: 149 mg/dL — AB (ref 65–99)
POTASSIUM: 3.5 mmol/L (ref 3.5–5.1)
SODIUM: 135 mmol/L (ref 135–145)
Total Bilirubin: 1 mg/dL (ref 0.3–1.2)
Total Protein: 5 g/dL — ABNORMAL LOW (ref 6.5–8.1)

## 2016-08-03 LAB — TROPONIN I
TROPONIN I: 0.05 ng/mL — AB (ref <0.03)
Troponin I: 0.09 ng/mL (ref <0.03)
Troponin I: 0.03 ng/mL (ref <0.03)

## 2016-08-03 LAB — INFLUENZA PANEL BY PCR (TYPE A & B)
Influenza A By PCR: NEGATIVE
Influenza B By PCR: NEGATIVE

## 2016-08-03 LAB — CBC WITH DIFFERENTIAL/PLATELET
BASOS PCT: 0 %
Basophils Absolute: 0 10*3/uL (ref 0.0–0.1)
EOS ABS: 0 10*3/uL (ref 0.0–0.7)
EOS PCT: 0 %
HCT: 31.3 % — ABNORMAL LOW (ref 36.0–46.0)
HEMOGLOBIN: 9.7 g/dL — AB (ref 12.0–15.0)
LYMPHS ABS: 1.2 10*3/uL (ref 0.7–4.0)
Lymphocytes Relative: 12 %
MCH: 29.1 pg (ref 26.0–34.0)
MCHC: 31 g/dL (ref 30.0–36.0)
MCV: 94 fL (ref 78.0–100.0)
MONO ABS: 0.9 10*3/uL (ref 0.1–1.0)
MONOS PCT: 10 %
NEUTROS PCT: 78 %
Neutro Abs: 7.3 10*3/uL (ref 1.7–7.7)
PLATELETS: 195 10*3/uL (ref 150–400)
RBC: 3.33 MIL/uL — ABNORMAL LOW (ref 3.87–5.11)
RDW: 14 % (ref 11.5–15.5)
WBC: 9.4 10*3/uL (ref 4.0–10.5)

## 2016-08-03 LAB — PROTIME-INR
INR: 1.19
PROTHROMBIN TIME: 15.1 s (ref 11.4–15.2)

## 2016-08-03 LAB — GLUCOSE, CAPILLARY: Glucose-Capillary: 112 mg/dL — ABNORMAL HIGH (ref 65–99)

## 2016-08-03 LAB — APTT: aPTT: 20 seconds — ABNORMAL LOW (ref 24–36)

## 2016-08-03 LAB — CK: Total CK: 491 U/L — ABNORMAL HIGH (ref 38–234)

## 2016-08-03 LAB — MRSA PCR SCREENING: MRSA by PCR: POSITIVE — AB

## 2016-08-03 LAB — LACTIC ACID, PLASMA: Lactic Acid, Venous: 1.1 mmol/L (ref 0.5–1.9)

## 2016-08-03 LAB — TYPE AND SCREEN
ABO/RH(D): A POS
ANTIBODY SCREEN: NEGATIVE

## 2016-08-03 LAB — PROCALCITONIN: PROCALCITONIN: 5.35 ng/mL

## 2016-08-03 SURGERY — LUMBAR WOUND DEBRIDEMENT
Anesthesia: General | Site: Back

## 2016-08-03 MED ORDER — ONDANSETRON HCL 4 MG/2ML IJ SOLN
INTRAMUSCULAR | Status: AC
  Filled 2016-08-03: qty 4

## 2016-08-03 MED ORDER — TRAZODONE HCL 50 MG PO TABS
50.0000 mg | ORAL_TABLET | Freq: Every day | ORAL | Status: DC
  Administered 2016-08-03 – 2016-08-07 (×5): 50 mg via ORAL
  Filled 2016-08-03 (×5): qty 1

## 2016-08-03 MED ORDER — THROMBIN 20000 UNITS EX SOLR
CUTANEOUS | Status: AC
  Filled 2016-08-03: qty 20000

## 2016-08-03 MED ORDER — MENTHOL 3 MG MT LOZG: 1.0000 | LOZENGE | OROMUCOSAL | Status: DC | PRN

## 2016-08-03 MED ORDER — POTASSIUM CHLORIDE IN NACL 20-0.9 MEQ/L-% IV SOLN
INTRAVENOUS | Status: DC
  Administered 2016-08-03: 22:00:00 via INTRAVENOUS
  Filled 2016-08-03 (×3): qty 1000

## 2016-08-03 MED ORDER — VANCOMYCIN HCL 1000 MG IV SOLR
INTRAVENOUS | Status: DC | PRN
Start: 2016-08-03 — End: 2016-08-03
  Administered 2016-08-03: 1000 mg

## 2016-08-03 MED ORDER — ROCURONIUM BROMIDE 100 MG/10ML IV SOLN
INTRAVENOUS | Status: DC | PRN
  Administered 2016-08-03: 20 mg via INTRAVENOUS

## 2016-08-03 MED ORDER — MEPERIDINE HCL 25 MG/ML IJ SOLN: 6.2500 mg | INTRAMUSCULAR | Status: DC | PRN

## 2016-08-03 MED ORDER — CHLORHEXIDINE GLUCONATE CLOTH 2 % EX PADS
6.0000 | MEDICATED_PAD | Freq: Every day | CUTANEOUS | Status: AC
  Administered 2016-08-04 – 2016-08-08 (×5): 6 via TOPICAL

## 2016-08-03 MED ORDER — OXYCODONE-ACETAMINOPHEN 5-325 MG PO TABS
2.0000 | ORAL_TABLET | ORAL | Status: DC | PRN
  Administered 2016-08-03 – 2016-08-08 (×15): 2 via ORAL
  Filled 2016-08-03 (×15): qty 2

## 2016-08-03 MED ORDER — SODIUM CHLORIDE 0.9 % IR SOLN
Status: DC | PRN
  Administered 2016-08-03 (×2): 500 mL

## 2016-08-03 MED ORDER — ACETAMINOPHEN 650 MG RE SUPP: 650.0000 mg | RECTAL | Status: DC | PRN

## 2016-08-03 MED ORDER — SUGAMMADEX SODIUM 500 MG/5ML IV SOLN
INTRAVENOUS | Status: DC | PRN
  Administered 2016-08-03: 250 mg via INTRAVENOUS

## 2016-08-03 MED ORDER — LINEZOLID 600 MG/300ML IV SOLN
600.0000 mg | Freq: Once | INTRAVENOUS | Status: DC
  Filled 2016-08-03: qty 300

## 2016-08-03 MED ORDER — MAGNESIUM OXIDE 400 (241.3 MG) MG PO TABS
400.0000 mg | ORAL_TABLET | Freq: Two times a day (BID) | ORAL | Status: DC
  Administered 2016-08-03 – 2016-08-08 (×11): 400 mg via ORAL
  Filled 2016-08-03 (×11): qty 1

## 2016-08-03 MED ORDER — MUPIROCIN 2 % EX OINT
1.0000 "application " | TOPICAL_OINTMENT | Freq: Two times a day (BID) | CUTANEOUS | Status: AC
  Administered 2016-08-03 – 2016-08-08 (×10): 1 via NASAL
  Filled 2016-08-03 (×8): qty 22

## 2016-08-03 MED ORDER — LACTATED RINGERS IV SOLN: INTRAVENOUS | Status: DC

## 2016-08-03 MED ORDER — HEMOSTATIC AGENTS (NO CHARGE) OPTIME
TOPICAL | Status: DC | PRN
  Administered 2016-08-03: 1 via TOPICAL

## 2016-08-03 MED ORDER — METHOCARBAMOL 500 MG PO TABS
500.0000 mg | ORAL_TABLET | Freq: Four times a day (QID) | ORAL | Status: DC
  Administered 2016-08-03 – 2016-08-08 (×20): 500 mg via ORAL
  Filled 2016-08-03 (×20): qty 1

## 2016-08-03 MED ORDER — ONDANSETRON HCL 4 MG/2ML IJ SOLN
4.0000 mg | Freq: Four times a day (QID) | INTRAMUSCULAR | Status: DC | PRN
  Administered 2016-08-03: 8 mg via INTRAVENOUS

## 2016-08-03 MED ORDER — SUCCINYLCHOLINE CHLORIDE 20 MG/ML IJ SOLN
INTRAMUSCULAR | Status: DC | PRN
  Administered 2016-08-03: 120 mg via INTRAVENOUS

## 2016-08-03 MED ORDER — PIPERACILLIN-TAZOBACTAM 3.375 G IVPB 30 MIN: 3.3750 g | Freq: Once | INTRAVENOUS | Status: DC

## 2016-08-03 MED ORDER — ACETAMINOPHEN 325 MG PO TABS
650.0000 mg | ORAL_TABLET | ORAL | Status: DC | PRN
  Administered 2016-08-05: 650 mg via ORAL
  Filled 2016-08-03: qty 2

## 2016-08-03 MED ORDER — SODIUM CHLORIDE 0.9 % IV SOLN
1000.0000 mg | INTRAVENOUS | Status: DC
  Administered 2016-08-03 – 2016-08-08 (×6): 1000 mg via INTRAVENOUS
  Filled 2016-08-03 (×8): qty 20

## 2016-08-03 MED ORDER — SODIUM CHLORIDE 0.9% FLUSH
3.0000 mL | Freq: Two times a day (BID) | INTRAVENOUS | Status: DC
  Administered 2016-08-04 – 2016-08-08 (×8): 3 mL via INTRAVENOUS

## 2016-08-03 MED ORDER — LINEZOLID 600 MG/300ML IV SOLN
600.0000 mg | Freq: Two times a day (BID) | INTRAVENOUS | Status: DC
  Filled 2016-08-03: qty 300

## 2016-08-03 MED ORDER — FENTANYL CITRATE (PF) 100 MCG/2ML IJ SOLN
25.0000 ug | INTRAMUSCULAR | Status: DC | PRN
  Administered 2016-08-03 (×2): 50 ug via INTRAVENOUS
  Administered 2016-08-03 (×2): 25 ug via INTRAVENOUS

## 2016-08-03 MED ORDER — ONDANSETRON HCL 4 MG/2ML IJ SOLN
4.0000 mg | INTRAMUSCULAR | Status: DC | PRN
  Administered 2016-08-07 – 2016-08-08 (×2): 4 mg via INTRAVENOUS
  Filled 2016-08-03 (×2): qty 2

## 2016-08-03 MED ORDER — LACTATED RINGERS IV SOLN
INTRAVENOUS | Status: DC | PRN
  Administered 2016-08-03: 16:00:00 via INTRAVENOUS

## 2016-08-03 MED ORDER — 0.9 % SODIUM CHLORIDE (POUR BTL) OPTIME
TOPICAL | Status: DC | PRN
  Administered 2016-08-03: 1000 mL

## 2016-08-03 MED ORDER — SODIUM CHLORIDE 0.9% FLUSH: 3.0000 mL | INTRAVENOUS | Status: DC | PRN

## 2016-08-03 MED ORDER — SODIUM CHLORIDE 0.9 % IV SOLN
INTRAVENOUS | Status: AC
  Administered 2016-08-03 (×2): via INTRAVENOUS

## 2016-08-03 MED ORDER — FENTANYL CITRATE (PF) 100 MCG/2ML IJ SOLN
INTRAMUSCULAR | Status: AC
  Filled 2016-08-03: qty 2

## 2016-08-03 MED ORDER — THROMBIN 5000 UNITS EX SOLR
CUTANEOUS | Status: DC | PRN
  Administered 2016-08-03: 10000 [IU] via TOPICAL
  Administered 2016-08-03: 5000 [IU] via TOPICAL

## 2016-08-03 MED ORDER — SODIUM CHLORIDE 0.9 % IV SOLN: 250.0000 mL | INTRAVENOUS | Status: DC

## 2016-08-03 MED ORDER — BUPIVACAINE HCL (PF) 0.25 % IJ SOLN
INTRAMUSCULAR | Status: AC
  Filled 2016-08-03: qty 30

## 2016-08-03 MED ORDER — BUPIVACAINE HCL (PF) 0.25 % IJ SOLN
INTRAMUSCULAR | Status: DC | PRN
  Administered 2016-08-03: 30 mL

## 2016-08-03 MED ORDER — ROCURONIUM BROMIDE 10 MG/ML (PF) SYRINGE
PREFILLED_SYRINGE | INTRAVENOUS | Status: AC
  Filled 2016-08-03: qty 10

## 2016-08-03 MED ORDER — LIDOCAINE 2% (20 MG/ML) 5 ML SYRINGE
INTRAMUSCULAR | Status: AC
  Filled 2016-08-03: qty 5

## 2016-08-03 MED ORDER — ACETAMINOPHEN 325 MG PO TABS
650.0000 mg | ORAL_TABLET | Freq: Four times a day (QID) | ORAL | Status: DC | PRN
  Administered 2016-08-03: 650 mg via ORAL
  Filled 2016-08-03: qty 2

## 2016-08-03 MED ORDER — THROMBIN 5000 UNITS EX SOLR
CUTANEOUS | Status: AC
  Filled 2016-08-03: qty 15000

## 2016-08-03 MED ORDER — FENTANYL CITRATE (PF) 100 MCG/2ML IJ SOLN
INTRAMUSCULAR | Status: AC
  Administered 2016-08-03: 50 ug via INTRAVENOUS
  Filled 2016-08-03: qty 2

## 2016-08-03 MED ORDER — POLYVINYL ALCOHOL 1.4 % OP SOLN: 1.0000 [drp] | Freq: Every day | OPHTHALMIC | Status: DC | PRN

## 2016-08-03 MED ORDER — PROPOFOL 10 MG/ML IV BOLUS
INTRAVENOUS | Status: DC | PRN
  Administered 2016-08-03: 150 mg via INTRAVENOUS

## 2016-08-03 MED ORDER — PHENOL 1.4 % MT LIQD: 1.0000 | OROMUCOSAL | Status: DC | PRN

## 2016-08-03 MED ORDER — PANTOPRAZOLE SODIUM 40 MG PO TBEC
40.0000 mg | DELAYED_RELEASE_TABLET | Freq: Every day | ORAL | Status: DC
  Administered 2016-08-03 – 2016-08-08 (×6): 40 mg via ORAL
  Filled 2016-08-03 (×6): qty 1

## 2016-08-03 MED ORDER — SUCCINYLCHOLINE CHLORIDE 200 MG/10ML IV SOSY
PREFILLED_SYRINGE | INTRAVENOUS | Status: AC
  Filled 2016-08-03: qty 10

## 2016-08-03 MED ORDER — ADULT MULTIVITAMIN W/MINERALS CH
1.0000 | ORAL_TABLET | Freq: Every day | ORAL | Status: DC
  Administered 2016-08-03 – 2016-08-08 (×6): 1 via ORAL
  Filled 2016-08-03 (×7): qty 1

## 2016-08-03 MED ORDER — PIPERACILLIN-TAZOBACTAM 3.375 G IVPB
3.3750 g | Freq: Three times a day (TID) | INTRAVENOUS | Status: DC
  Filled 2016-08-03 (×2): qty 50

## 2016-08-03 MED ORDER — MORPHINE SULFATE (PF) 2 MG/ML IV SOLN
2.0000 mg | INTRAVENOUS | Status: DC | PRN
Start: 2016-08-03 — End: 2016-08-04
  Administered 2016-08-03: 4 mg via INTRAVENOUS
  Administered 2016-08-03 (×2): 2 mg via INTRAVENOUS
  Administered 2016-08-04 (×3): 4 mg via INTRAVENOUS
  Filled 2016-08-03 (×3): qty 2
  Filled 2016-08-03 (×2): qty 1
  Filled 2016-08-03: qty 2

## 2016-08-03 MED ORDER — LORAZEPAM 1 MG PO TABS
1.0000 mg | ORAL_TABLET | Freq: Every day | ORAL | Status: DC | PRN
  Administered 2016-08-03 – 2016-08-07 (×4): 1 mg via ORAL
  Filled 2016-08-03 (×5): qty 1

## 2016-08-03 MED ORDER — ACETAMINOPHEN 650 MG RE SUPP: 650.0000 mg | Freq: Four times a day (QID) | RECTAL | Status: DC | PRN

## 2016-08-03 MED ORDER — FENTANYL CITRATE (PF) 100 MCG/2ML IJ SOLN
INTRAMUSCULAR | Status: DC | PRN
  Administered 2016-08-03: 25 ug via INTRAVENOUS
  Administered 2016-08-03: 100 ug via INTRAVENOUS
  Administered 2016-08-03: 25 ug via INTRAVENOUS
  Administered 2016-08-03: 50 ug via INTRAVENOUS

## 2016-08-03 MED ORDER — ONDANSETRON HCL 4 MG PO TABS: 4.0000 mg | ORAL_TABLET | Freq: Four times a day (QID) | ORAL | Status: DC | PRN

## 2016-08-03 MED ORDER — LIDOCAINE HCL (CARDIAC) 20 MG/ML IV SOLN
INTRAVENOUS | Status: DC | PRN
  Administered 2016-08-03: 60 mg via INTRAVENOUS

## 2016-08-03 MED ORDER — METOCLOPRAMIDE HCL 5 MG/ML IJ SOLN: 10.0000 mg | Freq: Once | INTRAMUSCULAR | Status: DC | PRN

## 2016-08-03 MED ORDER — VANCOMYCIN HCL 1000 MG IV SOLR
INTRAVENOUS | Status: AC
  Filled 2016-08-03: qty 3000

## 2016-08-03 MED ORDER — SUGAMMADEX SODIUM 500 MG/5ML IV SOLN
INTRAVENOUS | Status: AC
  Filled 2016-08-03: qty 5

## 2016-08-03 SURGICAL SUPPLY — 47 items
ADH SKN CLS LQ APL DERMABOND (GAUZE/BANDAGES/DRESSINGS) ×2
APL SKNCLS STERI-STRIP NONHPOA (GAUZE/BANDAGES/DRESSINGS) ×1
BAG DECANTER FOR FLEXI CONT (MISCELLANEOUS) ×6 IMPLANT
BENZOIN TINCTURE PRP APPL 2/3 (GAUZE/BANDAGES/DRESSINGS) ×3 IMPLANT
CANISTER SUCT 3000ML PPV (MISCELLANEOUS) ×3 IMPLANT
CLOSURE WOUND 1/2 X4 (GAUZE/BANDAGES/DRESSINGS) ×3
DERMABOND ADHESIVE PROPEN (GAUZE/BANDAGES/DRESSINGS) ×4
DERMABOND ADVANCED .7 DNX6 (GAUZE/BANDAGES/DRESSINGS) IMPLANT
DRAPE LAPAROTOMY 100X72X124 (DRAPES) ×3 IMPLANT
DRAPE POUCH INSTRU U-SHP 10X18 (DRAPES) ×3 IMPLANT
DRSG OPSITE POSTOP 4X10 (GAUZE/BANDAGES/DRESSINGS) ×2 IMPLANT
DURAPREP 26ML APPLICATOR (WOUND CARE) IMPLANT
DURAPREP 6ML APPLICATOR 50/CS (WOUND CARE) IMPLANT
ELECT REM PT RETURN 9FT ADLT (ELECTROSURGICAL) ×3
ELECTRODE REM PT RTRN 9FT ADLT (ELECTROSURGICAL) ×1 IMPLANT
EVACUATOR 1/8 PVC DRAIN (DRAIN) ×2 IMPLANT
GAUZE SPONGE 4X4 16PLY XRAY LF (GAUZE/BANDAGES/DRESSINGS) ×2 IMPLANT
GLOVE BIO SURGEON STRL SZ8 (GLOVE) ×3 IMPLANT
GOWN STRL REUS W/ TWL LRG LVL3 (GOWN DISPOSABLE) IMPLANT
GOWN STRL REUS W/ TWL XL LVL3 (GOWN DISPOSABLE) IMPLANT
GOWN STRL REUS W/TWL 2XL LVL3 (GOWN DISPOSABLE) ×3 IMPLANT
GOWN STRL REUS W/TWL LRG LVL3 (GOWN DISPOSABLE)
GOWN STRL REUS W/TWL XL LVL3 (GOWN DISPOSABLE)
KIT BASIN OR (CUSTOM PROCEDURE TRAY) ×3 IMPLANT
KIT ROOM TURNOVER OR (KITS) ×3 IMPLANT
NDL HYPO 18GX1.5 BLUNT FILL (NEEDLE) IMPLANT
NDL HYPO 25X1 1.5 SAFETY (NEEDLE) ×1 IMPLANT
NDL SPNL 20GX3.5 QUINCKE YW (NEEDLE) IMPLANT
NEEDLE HYPO 18GX1.5 BLUNT FILL (NEEDLE) IMPLANT
NEEDLE HYPO 25X1 1.5 SAFETY (NEEDLE) ×3 IMPLANT
NEEDLE SPNL 20GX3.5 QUINCKE YW (NEEDLE) IMPLANT
NS IRRIG 1000ML POUR BTL (IV SOLUTION) ×3 IMPLANT
PACK LAMINECTOMY NEURO (CUSTOM PROCEDURE TRAY) ×3 IMPLANT
PAD ARMBOARD 7.5X6 YLW CONV (MISCELLANEOUS) ×9 IMPLANT
SCRUB BETADINE 4OZ XXX (MISCELLANEOUS) ×2 IMPLANT
SOLUTION BETADINE 4OZ (MISCELLANEOUS) ×2 IMPLANT
STRIP CLOSURE SKIN 1/2X4 (GAUZE/BANDAGES/DRESSINGS) ×4 IMPLANT
SUT VIC AB 0 CT1 18XCR BRD8 (SUTURE) ×1 IMPLANT
SUT VIC AB 0 CT1 8-18 (SUTURE) ×9
SUT VIC AB 2-0 CP2 18 (SUTURE) ×3 IMPLANT
SUT VIC AB 3-0 SH 8-18 (SUTURE) ×7 IMPLANT
SWAB COLLECTION DEVICE MRSA (MISCELLANEOUS) ×3 IMPLANT
SWAB CULTURE ESWAB REG 1ML (MISCELLANEOUS) ×3 IMPLANT
SYR 3ML LL SCALE MARK (SYRINGE) IMPLANT
TOWEL OR 17X24 6PK STRL BLUE (TOWEL DISPOSABLE) ×3 IMPLANT
TOWEL OR 17X26 10 PK STRL BLUE (TOWEL DISPOSABLE) ×3 IMPLANT
WATER STERILE IRR 1000ML POUR (IV SOLUTION) ×3 IMPLANT

## 2016-08-03 NOTE — Op Note (Signed)
08/02/2016 - 08/03/2016  6:14 PM  PATIENT:  Jeanice Lim Herford  70 y.o. female  PRE-OPERATIVE DIAGNOSIS: Lumbar wound infection with poor wound healing  POST-OPERATIVE DIAGNOSIS:  Same  PROCEDURE:  Irrigation and debridement of large complex lumbar wound infection with debridement of deep tissues and primary closure  SURGEON:  Sherley Bounds, MD  ASSISTANTS: none  ANESTHESIA:   General  EBL: 30 ml  Total I/O In: 2120.8 [I.V.:2120.8] Out: 30 [Blood:30]  BLOOD ADMINISTERED:none  DRAINS: 2 medium Hemovacs   SPECIMEN:  No Specimen  INDICATION FOR PROCEDURE: This patient underwent a lumbar fusion about 4 weeks ago by one of my partners. She has had significant drainage for days. She presented with fevers and pain and drainage. MRI showed a large fluid collection. Lumbar exploration with irrigation and debridement of the wound. Patient understood the risks, benefits, and alternatives and potential outcomes and wished to proceed.  PROCEDURE DETAILS: The patient was taken to the operating room and after induction of adequate generalized endotracheal anesthesia she was rolled into the prone position on chest rolls and all pressure points were padded. Lumbar region was cleaned and prepped with Betadine scrub and paint. It was then draped in the usual sterile fashion. There was copious amounts of brownish drainage from the wound. The old incision was ellipsed out and I carried the dissection down through the deep subcutaneous tissues to the fascia. The fascia was opened. The tissues were debrided and cleaned. We debrided until there was nice healthy-looking bleeding tissue. I then irrigated with 2 L of bacitracin-containing saline solution. I placed 2 medium Hemovac drains through separate stab incisions. I placed vancomycin powder into the wound. I then closed the subcutaneous tissues in 2 layers of 0 Vicryl. One layer of 2-0 Vicryl in the subcutaneous tissues. Subcuticular tissue was closed with 3-0  Vicryl. The skin was closed with Dermabond benzoin and Steri-Strips. A sterile dressing was applied. The patient was then awakened from general anesthesia and transported to the recovery room in stable condition. At the end of the procedure all sponge needle and instrument counts correct.  PLAN OF CARE: Admit to inpatient   PATIENT DISPOSITION:  PACU - hemodynamically stable.   Delay start of Pharmacological VTE agent (>24hrs) due to surgical blood loss or risk of bleeding:  yes

## 2016-08-03 NOTE — Progress Notes (Signed)
CRITICAL VALUE ALERT  Critical value received:  Troponin 0.09  Date of notification:  08/03/16  Time of notification:  0815  Critical value read back:Yes.    Nurse who received alert:  Shara Blazing  MD notified (1st page):  [place] Pink   Time of first page:  0815  MD notified (2nd page):  Time of second page:  Responding MD:  [place] Pink  Time MD responded:  [phone]

## 2016-08-03 NOTE — ED Notes (Signed)
PT linens and gown changed due to large amount of drainage from incision on back. ABD pads and tape applied to wound. Pt repositioned and warm blankets given. Pt states pain is 10/10 at this time and informed pain meds not due for 1 hour. Pt notified I would page admitting my for additional medication at this time.

## 2016-08-03 NOTE — Anesthesia Procedure Notes (Signed)
Procedure Name: Intubation Date/Time: 08/03/2016 4:28 PM Performed by: Eligha Bridegroom Pre-anesthesia Checklist: Patient identified, Emergency Drugs available, Suction available, Patient being monitored and Timeout performed Patient Re-evaluated:Patient Re-evaluated prior to inductionOxygen Delivery Method: Circle system utilized Preoxygenation: Pre-oxygenation with 100% oxygen Intubation Type: IV induction, Rapid sequence and Cricoid Pressure applied Laryngoscope Size: 4 Grade View: Grade I Tube type: Oral Tube size: 7.0 mm Number of attempts: 1 Airway Equipment and Method: Stylet Dental Injury: Teeth and Oropharynx as per pre-operative assessment

## 2016-08-03 NOTE — Progress Notes (Signed)
Patient ID: Tracy Espinoza, female   DOB: Jul 27, 1946, 70 y.o.   MRN: [id number]  This pt underwent a L4-5 fusion by [facility] on 07/10/2016. She has been in rehabilitation for 2 weeks where she has had a draining wound. She was admitted last night with drainage and fever and pain. Pain has worsened over the last 3 days. MRI was performed and showed a subcutaneous fluid collection.  She looks comfortable lying in bed but has severe pain with turning and better with movement. Her wound shows significant amounts of fibrinous exudate with serosanguineous drainage and dehiscence. She is on antibiotics.  I have recommended a irrigation and debridement of lumbar wound with revision of the wound. I will culture the wound but I'm not sure that it will show much since she is on antibiotics. I cannot promise that the wound would not dehisce again given her body habitus but I think that irrigation and debridement and wound revision is the correct initial step. This will allow culture and hopefully quicker healing. I think it will take quite a long time to heal this wound through wound care and secondary intention. They understand the risk include but are not limited to bleeding, infection, need for further surgery, lack of relief of symptoms, worsening symptoms, and anesthesia risk. They agree to proceed.

## 2016-08-03 NOTE — ED Notes (Signed)
Pt taken to MRI  

## 2016-08-03 NOTE — Anesthesia Postprocedure Evaluation (Signed)
Anesthesia Post Note  Patient: Tracy Espinoza  Procedure(s) Performed: Procedure(s) (LRB): IRRIGATION AND DEBRIDEMENT LUMBAR WOUND (N/A)  Patient location during evaluation: PACU Anesthesia Type: General Level of consciousness: awake Pain management: pain level controlled Respiratory status: spontaneous breathing Anesthetic complications: no    Last Vitals:  Vitals:   08/03/16 1834 08/03/16 1950  BP: (!) 165/99 (!) 127/93  Pulse: 82 86  Resp: 18 (!) 33  Temp:  37.5 C    Last Pain:  Vitals:   08/03/16 1950  TempSrc: Axillary  PainSc:                  EDWARDS,Tayleigh Wetherell

## 2016-08-03 NOTE — Progress Notes (Signed)
Pharmacy Antibiotic Note  Tracy Espinoza is a 70 y.o. female admitted on 08/02/2016 with sepsis.  Pharmacy has been consulted for Zosyn dosing.  Pt received Zosyn 3.375gm IV in ED ~2100. Pt also received Vanc 1gm in ED ~2150 - pt with rash after receiving Vanc so now listing as allergy. MD to start Linezolid and consult ID in a.m.  Plan: Zosyn 3.375gm IV now over 30 min then 3.375gm IV q8h - subsequent doses over 4 hours Will f/u micro data, renal function, and pt's clinical condition F/u ID consult     Temp (24hrs), Avg:101.5 F (38.6 C), Min:100.4 F (38 C), Max:102.2 F (39 C)   Recent Labs Lab 07/31/16 1200 08/02/16 2015 08/02/16 2026 08/02/16 2332  WBC 10.9 14.9*  --   --   CREATININE  --  1.13*  --   --   LATICACIDVEN  --   --  2.29* 1.15    CrCl cannot be calculated (Unknown ideal weight.).    Allergies  Allergen Reactions  . Tape Itching, Dermatitis and Rash  . Band-Aid Plus Antibiotic [Bacitracin-Polymyxin B] Other (See Comments)    Unknown  . Iodinated Diagnostic Agents Itching  . Morphine Sulfate Itching  . Vancomycin Rash    Antimicrobials this admission: 11/1 Vanc x 1 11/1 Zosyn >> 11/2 Linezolid >>   Microbiology results: 11/1 BCx x2:  11/1 UCx:    Thank you for allowing pharmacy to be a part of this patient's care.  Sherlon Handing, PharmD, BCPS Clinical pharmacist, pager [phone] 08/03/2016 1:16 AM

## 2016-08-03 NOTE — ED Notes (Signed)
Pt remains in MRI 

## 2016-08-03 NOTE — Progress Notes (Signed)
[facility] PROGRESS NOTE  SHANEIL TAPIA [id number] DOB: 1946/01/10 DOA: 08/02/2016  PCP: Golden Pop, MD  Brief History/Interval Summary: A 70-year-old Caucasian female who is morbidly obese, who has a past medical history of hypertension and underwent lumbar decompression surgery on October 9. She was discharged to rehabilitation. Patient did well at rehabilitation and went back home about a week ago. Over the last 2-3 days she has noticed worsening lower back pain with drainage from her incision site, which has been clear for the most part. And then, she's developed fever and chills. She was hospitalized for further management.  Reason for Visit: Fever and low back pain  Consultants: Neurosurgery  Procedures: None yet  Antibiotics: Linezolid and Zosyn  Subjective/Interval History: Patient continues to have severe low back pain. She mentions that she's had significant amount of drainage from her incision site over the last few days. She has had a couple incidents of urinary incontinence but denies any stool incontinence. Denies any burning sensation with urination. She does mention history of frequent UTI and has been on ciprofloxacin recently.  ROS: Denies any nausea, vomiting or diarrhea. Denies any cough or shortness of breath.  Objective:  Vital Signs  Vitals:   08/03/16 0750 08/03/16 0800 08/03/16 0835 08/03/16 0900  BP:  117/86 (!) 131/53 113/62  Pulse: 81 84 (!) 55 81  Resp: (!) 21 19 (!) 29 (!) 28  Temp:   98.8 F (37.1 C)   TempSrc:   Oral   SpO2: 99% 93% 100% 93%  Weight:      Height:        Intake/Output Summary (Last 24 hours) at 08/03/16 1113 Last data filed at 08/03/16 0900  Gross per 24 hour  Intake          3720.83 ml  Output              400 ml  Net          3320.83 ml   Filed Weights   08/03/16 0628  Weight: 134.9 kg (297 lb 6.4 oz)    General appearance: alert, cooperative, appears stated age and no distress Head: Normocephalic,  without obvious abnormality, atraumatic Resp: clear to auscultation bilaterally Cardio: regular rate and rhythm, S1, S2 normal, no murmur, click, rub or gallop GI: soft, non-tender; bowel sounds normal; no masses,  no organomegaly Extremities: extremities normal, atraumatic, no cyanosis or edema Back: Yellowish exudate noted over the incision site. Currently no drainage is noted. Limited exam as the patient couldn't turn over all the way. Neurologic: Awake and alert. Oriented 3. Cranial nerves II-12 intact. Motor strength equal bilateral upper extremities. Equal in the lower extremities as well. Although limited due to generalized weakness.  Lab Results:  Data Reviewed: I have personally reviewed following labs and imaging studies  CBC:  Recent Labs Lab 07/31/16 1200 08/02/16 2015 08/03/16 0335  WBC 10.9 14.9* 9.4  NEUTROABS 6.8* 11.5* 7.3  HGB 11.2* 10.2* 9.7*  HCT 33.4* 32.3* 31.3*  MCV 89.7 92.3 94.0  PLT 281 262 0000000    Basic Metabolic Panel:  Recent Labs Lab 08/02/16 2015 08/03/16 0335  NA 134* 135  K 3.4* 3.5  CL 96* 101  CO2 26 26  GLUCOSE 192* 149*  BUN 33* 28*  CREATININE 1.13* 0.94  CALCIUM 8.4* 7.4*    GFR: Estimated Creatinine Clearance: 72.8 mL/min (by C-G formula based on SCr of 0.94 mg/dL).  Liver Function Tests:  Recent Labs Lab 08/02/16 2015 08/03/16 0335  AST 31 30  ALT 14 15  ALKPHOS 85 73  BILITOT 1.0 1.0  PROT 6.1* 5.0*  ALBUMIN 2.7* 2.1*    Coagulation Profile:  Recent Labs Lab 08/03/16 0335  INR 1.19    Cardiac Enzymes:  Recent Labs Lab 08/03/16 0721  TROPONINI 0.09*     Recent Results (from the past 240 hour(s))  Blood Culture (routine x 2)     Status: None (Preliminary result)   Collection Time: 08/02/16  8:15 PM  Result Value Ref Range Status   Specimen Description BLOOD LEFT FOREARM  Final   Special Requests BOTTLES DRAWN AEROBIC AND ANAEROBIC 5CC  Final   Culture  Setup Time   Final    GRAM POSITIVE COCCI  IN CLUSTERS IN BOTH AEROBIC AND ANAEROBIC BOTTLES Organism ID to follow    Culture NO GROWTH < 12 HOURS  Final   Report Status PENDING  Incomplete  Blood Culture (routine x 2)     Status: None (Preliminary result)   Collection Time: 08/02/16  8:35 PM  Result Value Ref Range Status   Specimen Description BLOOD LEFT FOREARM  Final   Special Requests IN PEDIATRIC BOTTLE 1CC  Final   Culture NO GROWTH < 12 HOURS  Final   Report Status PENDING  Incomplete      Radiology Studies: Mr Lumbar Spine Wo Contrast  Result Date: 08/03/2016 CLINICAL DATA:  70 y/o  F; fever and sepsis. EXAM: MRI LUMBAR SPINE WITHOUT CONTRAST TECHNIQUE: Multiplanar, multisequence MR imaging of the lumbar spine was performed. No intravenous contrast was administered. COMPARISON:  07/27/2016 lumbar radiographs.  03/13/2016 lumbar MRI. FINDINGS: Segmentation:  Standard. Alignment:  Normal lumbar lordosis.  No listhesis. Vertebrae: Status post L4-5 posterior instrumented fusion, interbody fusion, and laminectomy. The vertebral bodies at those levels are partially obscured by susceptibility artifact from the fusion hardware. There is increased T2 signal of the L4-5 intervertebral disc without appreciable abnormal signal in the opposing articular endplates probably representing postsurgical changes related to the interbody fusion. No definite evidence for discitis, fracture, or suspicious osseous lesion. Conus medullaris: Extends to the T12-L1 level and appears normal. Paraspinal and other soft tissues: Atrophic left kidney with multiple cysts. There is a fluid collection within the subcutaneous fat of the back at the level of surgical fusion extending to the myofascial boundary measuring approximately 50 x 70 x 70 mm (AP x ML x CC). Additionally, there is extensive edema in the paraspinal muscles at the site of surgery and at levels of laminectomy. Disc levels: L1-2: Small disc bulge and mild facet hypertrophy greater on the right with  mild right-sided foraminal narrowing and lateral recess narrowing. L2-3: Small disc bulge and mild bilateral facet hypertrophy greater on the right. Right greater than left mild foraminal and lateral recess narrowing. L3-4: Small disc bulge and moderate ligamentum flavum/ facet hypertrophy. Mild bilateral foraminal narrowing, right greater than left lateral recess narrowing with contact upon descending right L4 nerve root. Mild canal stenosis. L4-5: Postsurgical changes related to discectomy and interbody fusion. There is intermediate T1 and high T2 signal within the paraspinal muscles and the laminectomy beds. There is encroachment on the posterior aspect of thecal sac bilaterally with moderate canal stenosis and foraminal narrowing bilaterally. L5-S1: Small central protrusion and mild facet arthropathy without significant foraminal narrowing or canal stenosis. IMPRESSION: 1. Edema within paraspinal muscles and the laminectomy bed at the L4-5 levels with encroachment on the thecal sac posteriorly at L4-5 with moderate canal stenosis and bilateral foraminal narrowing,  probably postsurgical edema. Suboptimal assessment for abscess or epidural fluid collections in the absence of intravenous contrast. 2. Large fluid collection in the subcutaneous fat at level of surgery likely a postoperative seroma. 3. Increased T2 signal within the L4-5 disc space without abnormal signal of opposing endplates is probably postoperative related to interbody fusion. No definite evidence for discitis. 4. Otherwise multilevel degenerative changes with mild foraminal narrowing and mild canal stenosis at the L3-4 level. Electronically Signed   By: Kristine Garbe M.D.   On: 08/03/2016 00:51   Dg Chest Port 1 View  Result Date: 08/02/2016 CLINICAL DATA:  Postop fever, hypoxia, and tachypnea. Recent back surgery. EXAM: PORTABLE CHEST 1 VIEW COMPARISON:  05/03/2011 FINDINGS: Mild cardiomegaly and tortuosity of thoracic aorta again  noted. Aortic atherosclerosis. Both lungs are clear. No evidence of pneumothorax or pleural effusion. IMPRESSION: Stable mild cardiomegaly and aortic atherosclerosis. No active lung disease. Electronically Signed   By: Earle Gell M.D.   On: 08/02/2016 20:20     Medications:  Scheduled: . linezolid (ZYVOX) IV  600 mg Intravenous Once  . magnesium oxide  400 mg Oral BID  . methocarbamol  500 mg Oral Q6H  . multivitamin with minerals  1 tablet Oral Daily  . pantoprazole  40 mg Oral Daily  . piperacillin-tazobactam (ZOSYN)  IV  3.375 g Intravenous Q8H  . traZODone  50 mg Oral QHS   Continuous: . sodium chloride 125 mL/hr at 08/03/16 0900   [id number] **OR** acetaminophen, LORazepam, ondansetron **OR** ondansetron (ZOFRAN) IV, oxyCODONE-acetaminophen, polyvinyl alcohol  Assessment/Plan:  Principal Problem:   Sepsis ([place]) Active Problems:   Hypertension   Normocytic normochromic anemia   ARF (acute renal failure) ([place])    Sepsis Source is unclear, but all testing and symptoms point towards her lower back. MRI of the lumbar spine does show a focal fluid collection in the subcutaneous tissue at the operative site. Thought to be a seroma. UA was mildly abnormal, but not conclusive for UTI, although she had been on ciprofloxacin recently. No other sources of infection identified. She was placed initially on vancomycin and Zosyn. However, patient had a local reaction to the vancomycin. This was discontinued and the patient was placed on linezolid. Continue for now. Await culture data. WBC has improved this morning. Patient did have elevated lactic acid level which has improved. Pro-calcitonin level also noted to be elevated.  Acute renal failure. Secondary to hypotension and dehydration. Meloxicam was discontinued. With hydration renal function has improved. Monitor urine output.  Recent lumbar surgery with abnormal MRI findings. See above as well. Awaiting callback from  neurosurgery to discuss these findings and patient's presentation. Pain control. No obvious neurological deficits at this time.  Normocytic anemia. Hemoglobin has dropped some, most likely due to dilution. No evidence for overt bleeding. Continue to monitor.  Mildly elevated troponin. Patient denies any chest pain. EKG without any ischemic changes. Repeat levels. Echocardiogram.  History of essential hypertension. Blood pressure medications on hold as the patient was hypotensive. Blood pressures have improved. Continue to hold her medications for now.  Allergic reaction to vancomycin Patient developed a skin rash at the site of infusion. Rash is improved today. Vancomycin was stopped.  DVT Prophylaxis: SCDs    Code Status: Full code  Family Communication: Discussed with the patient  Disposition Plan: Await neurosurgery input. Continue antibiotics. Await culture data.    LOS: 1 day   [facility] Pager [phone] 08/03/2016, 11:13 AM  If 7PM-7AM, please contact night-coverage  at [email or web address], password [facility]

## 2016-08-03 NOTE — Transfer of Care (Signed)
Immediate Anesthesia Transfer of Care Note  Patient: Tracy Espinoza  Procedure(s) Performed: Procedure(s): IRRIGATION AND DEBRIDEMENT LUMBAR WOUND (N/A)  Patient Location: PACU  Anesthesia Type:General  Level of Consciousness: awake and alert   Airway & Oxygen Therapy: Patient Spontanous Breathing and Patient connected to nasal cannula oxygen  Post-op Assessment: Report given to RN and Post -op Vital signs reviewed and stable  Post vital signs: Reviewed and stable  Last Vitals:  Vitals:   08/03/16 1500 08/03/16 1802  BP: (!) 120/53   Pulse: 76   Resp: (!) 23   Temp:  37.1 C    Last Pain:  Vitals:   08/03/16 1320  TempSrc:   PainSc: 7       Patients Stated Pain Goal: 0 ([id number])  Complications: No apparent anesthesia complications

## 2016-08-03 NOTE — Consult Note (Signed)
[facility] for Infectious Disease       Reason for Consult: MRSA bacteremia    Referring Physician: [facility] autoconsult/Dr. Maryland Pink  Principal Problem:   Sepsis [facility]) Active Problems:   Hypertension   Normocytic normochromic anemia   ARF (acute renal failure) ([place])   . DAPTOmycin (CUBICIN)  IV  1,000 mg Intravenous Q24H  . magnesium oxide  400 mg Oral BID  . methocarbamol  500 mg Oral Q6H  . multivitamin with minerals  1 tablet Oral Daily  . pantoprazole  40 mg Oral Daily  . traZODone  50 mg Oral QHS    Recommendations: Continue daptomycin Will need a prolonged course of 6 weeks Repeat blood cultures to assure clearance Saturday picc line next week if blood cultures negative TTE   Assessment: She has bacteremia with 2/2 blood cultures positive and possible discitis s/p L4-5 decompressive laminectomy, pedicle screw fixation.   Antibiotics: Vancomycin x 1 dose and caused local rash; linezolid and zosyn x 1 day; daptomycin day 1  HPI: Tracy Espinoza is a 70 y.o. female with over 1 year of worsening back pain that failed injections and had a lumbar laminectomy done on 10/9 who now presents with fever, chills and increasing pain.  She required fluid boluses, and started on broad spectrum antibiotics and now with 2/2 blood cultures with MRSA.  Pain in back.  Going to surgery today for debridement of wound.  Has had drainage.  No diarrhea.   MRI independently reviewed   Review of Systems:  Constitutional: negative for anorexia Gastrointestinal: negative for diarrhea Integument/breast: negative for rash, previous rash resolved Musculoskeletal: negative for myalgias All other systems reviewed and are negative   Past Medical History:  Diagnosis Date  . Anxiety   . Arthritis   . Cancer ([place])    BASAL CELL SKIN CANCERS REMOVED  . GERD (gastroesophageal reflux disease)   . History of hiatal hernia   . Hypertension     Social History  Substance Use Topics  .  Smoking status: Never Smoker  . Smokeless tobacco: Never Used  . Alcohol use 1.8 oz/week    3 Glasses of wine per week    Family History  Problem Relation Age of Onset  . Hypertension Mother   . Stroke Mother   . Alzheimer's disease Mother   . Cancer Father     brain  . Hypertension Father   . Stroke Father     Allergies  Allergen Reactions  . Tape Itching, Dermatitis and Rash  . Band-Aid Plus Antibiotic [Bacitracin-Polymyxin B] Other (See Comments)    Unknown  . Iodinated Diagnostic Agents Itching  . Morphine Sulfate Itching  . Vancomycin Rash    Physical Exam: Constitutional: in no apparent distress  Vitals:   08/03/16 1200 08/03/16 1300  BP: 111/79 (!) 131/53  Pulse: 84 84  Resp: (!) 28 17  Temp:     EYES: anicteric ENMT: no thrush Cardiovascular: Cor RRR Respiratory: CTA B; normal respiratory effort GI: Bowel sounds are normal, liver is not enlarged, spleen is not enlarged Musculoskeletal: no pedal edema noted Skin: negatives: no rash Hematologic: no cervical lad  Lab Results  Component Value Date   WBC 9.4 08/03/2016   HGB 9.7 (L) 08/03/2016   HCT 31.3 (L) 08/03/2016   MCV 94.0 08/03/2016   PLT 195 08/03/2016    Lab Results  Component Value Date   CREATININE 0.94 08/03/2016   BUN 28 (H) 08/03/2016   NA 135 08/03/2016  K 3.5 08/03/2016   CL 101 08/03/2016   CO2 26 08/03/2016    Lab Results  Component Value Date   ALT 15 08/03/2016   AST 30 08/03/2016   ALKPHOS 73 08/03/2016     Microbiology: Recent Results (from the past 240 hour(s))  Blood Culture (routine x 2)     Status: None (Preliminary result)   Collection Time: 08/02/16  8:15 PM  Result Value Ref Range Status   Specimen Description BLOOD LEFT FOREARM  Final   Special Requests BOTTLES DRAWN AEROBIC AND ANAEROBIC 5CC  Final   Culture  Setup Time   Final    GRAM POSITIVE COCCI IN CLUSTERS IN BOTH AEROBIC AND ANAEROBIC BOTTLES Organism ID to follow CRITICAL RESULT CALLED TO, READ  BACK BY AND VERIFIED WITH: Hughie Closs, [place] AT [place] ON [address] BY M. WILSON    Culture CULTURE REINCUBATED FOR BETTER GROWTH  Final   Report Status PENDING  Incomplete  Blood Culture ID Panel (Reflexed)     Status: Abnormal   Collection Time: 08/02/16  8:15 PM  Result Value Ref Range Status   Enterococcus species NOT DETECTED NOT DETECTED Final   Listeria monocytogenes NOT DETECTED NOT DETECTED Final   Staphylococcus species DETECTED (A) NOT DETECTED Final    Comment: CRITICAL RESULT CALLED TO, READ BACK BY AND VERIFIED WITH: M. MACCIA, PHARM AT 1140 ON [address] BY S. YARBROUGH    Staphylococcus aureus DETECTED (A) NOT DETECTED Final    Comment: CRITICAL RESULT CALLED TO, READ BACK BY AND VERIFIED WITH: M. MACCIA, PHARM AT 1140 ON [address] BY S. YARBROUGH    Methicillin resistance DETECTED (A) NOT DETECTED Final    Comment: CRITICAL RESULT CALLED TO, READ BACK BY AND VERIFIED WITH: M. MACCIA, PHARM AT 1140 ON [address] BY S. YARBROUGH    Streptococcus species NOT DETECTED NOT DETECTED Final   Streptococcus agalactiae NOT DETECTED NOT DETECTED Final   Streptococcus pneumoniae NOT DETECTED NOT DETECTED Final   Streptococcus pyogenes NOT DETECTED NOT DETECTED Final   Acinetobacter baumannii NOT DETECTED NOT DETECTED Final   Enterobacteriaceae species NOT DETECTED NOT DETECTED Final   Enterobacter cloacae complex NOT DETECTED NOT DETECTED Final   Escherichia coli NOT DETECTED NOT DETECTED Final   Klebsiella oxytoca NOT DETECTED NOT DETECTED Final   Klebsiella pneumoniae NOT DETECTED NOT DETECTED Final   Proteus species NOT DETECTED NOT DETECTED Final   Serratia marcescens NOT DETECTED NOT DETECTED Final   Haemophilus influenzae NOT DETECTED NOT DETECTED Final   Neisseria meningitidis NOT DETECTED NOT DETECTED Final   Pseudomonas aeruginosa NOT DETECTED NOT DETECTED Final   Candida albicans NOT DETECTED NOT DETECTED Final   Candida glabrata NOT DETECTED NOT DETECTED Final   Candida krusei  NOT DETECTED NOT DETECTED Final   Candida parapsilosis NOT DETECTED NOT DETECTED Final   Candida tropicalis NOT DETECTED NOT DETECTED Final  Blood Culture (routine x 2)     Status: None (Preliminary result)   Collection Time: 08/02/16  8:35 PM  Result Value Ref Range Status   Specimen Description BLOOD LEFT FOREARM  Final   Special Requests IN PEDIATRIC BOTTLE 1CC  Final   Culture  Setup Time   Final    GRAM POSITIVE COCCI IN CLUSTERS IN PEDIATRIC BOTTLE CRITICAL VALUE NOTED.  VALUE IS CONSISTENT WITH PREVIOUSLY REPORTED AND CALLED VALUE.    Culture CULTURE REINCUBATED FOR BETTER GROWTH  Final   Report Status PENDING  Incomplete    COMER, Herbie [place], [place] for Infectious  Disease [facility] [email or web address] [phone] pager  [phone] cell 08/03/2016, 2:48 PM

## 2016-08-03 NOTE — Progress Notes (Signed)
PHARMACY - PHYSICIAN COMMUNICATION CRITICAL VALUE ALERT - BLOOD CULTURE IDENTIFICATION (BCID)  Results for orders placed or performed during the hospital encounter of 08/02/16  Blood Culture ID Panel (Reflexed) (Collected: 08/02/2016  8:15 PM)  Result Value Ref Range   Enterococcus species NOT DETECTED NOT DETECTED   Listeria monocytogenes NOT DETECTED NOT DETECTED   Staphylococcus species DETECTED (A) NOT DETECTED   Staphylococcus aureus DETECTED (A) NOT DETECTED   Methicillin resistance DETECTED (A) NOT DETECTED   Streptococcus species NOT DETECTED NOT DETECTED   Streptococcus agalactiae NOT DETECTED NOT DETECTED   Streptococcus pneumoniae NOT DETECTED NOT DETECTED   Streptococcus pyogenes NOT DETECTED NOT DETECTED   Acinetobacter baumannii NOT DETECTED NOT DETECTED   Enterobacteriaceae species NOT DETECTED NOT DETECTED   Enterobacter cloacae complex NOT DETECTED NOT DETECTED   Escherichia coli NOT DETECTED NOT DETECTED   Klebsiella oxytoca NOT DETECTED NOT DETECTED   Klebsiella pneumoniae NOT DETECTED NOT DETECTED   Proteus species NOT DETECTED NOT DETECTED   Serratia marcescens NOT DETECTED NOT DETECTED   Haemophilus influenzae NOT DETECTED NOT DETECTED   Neisseria meningitidis NOT DETECTED NOT DETECTED   Pseudomonas aeruginosa NOT DETECTED NOT DETECTED   Candida albicans NOT DETECTED NOT DETECTED   Candida glabrata NOT DETECTED NOT DETECTED   Candida krusei NOT DETECTED NOT DETECTED   Candida parapsilosis NOT DETECTED NOT DETECTED   Candida tropicalis NOT DETECTED NOT DETECTED    Name of physician (or Provider) Contacted: Maryland Pink, [place]  Changes to prescribed antibiotics required: D/c linezolid, zosyn. Initiate daptomycin 1000 mg ( 8mg /kg q24h). BL Ck and weekly CK ordered.   Carlean Jews, Pharm.D. PGY1 Pharmacy Resident 11/2/201711:54 AM Pager [phone]

## 2016-08-03 NOTE — Anesthesia Preprocedure Evaluation (Signed)
Anesthesia Evaluation  Patient identified by MRN, date of birth, ID band Patient awake    Reviewed: Allergy & Precautions, NPO status , Patient's Chart, lab work & pertinent test results  Airway Mallampati: II  TM Distance: <3 FB Neck ROM: full    Dental  (+) Teeth Intact, Poor Dentition, Chipped, Dental Advisory Given,    Pulmonary neg pulmonary ROS,    breath sounds clear to auscultation       Cardiovascular hypertension, Pt. on medications  Rhythm:regular Rate:Normal     Neuro/Psych Anxiety    GI/Hepatic hiatal hernia, GERD  Medicated and Controlled,  Endo/Other  Morbid obesityHypercholesterolemia  Renal/GU Renal disease     Musculoskeletal  (+) Arthritis , Osteoarthritis,  Spinal stenosis Spondylolisthesis L4-5 S/P L4-5 PLIF   Abdominal (+) + obese,   Peds  Hematology  (+) anemia , Sepsis   Anesthesia Other Findings   Reproductive/Obstetrics                             Anesthesia Physical  Anesthesia Plan  ASA: III  Anesthesia Plan: General   Post-op Pain Management:    Induction: Intravenous, Rapid sequence and Cricoid pressure planned  Airway Management Planned: Oral ETT  Additional Equipment:   Intra-op Plan:   Post-operative Plan: Extubation in OR  Informed Consent: I have reviewed the patients History and Physical, chart, labs and discussed the procedure including the risks, benefits and alternatives for the proposed anesthesia with the patient or authorized representative who has indicated his/her understanding and acceptance.   Dental advisory given  Plan Discussed with: CRNA, Anesthesiologist and Surgeon  Anesthesia Plan Comments:         Anesthesia Quick Evaluation

## 2016-08-03 NOTE — H&P (Signed)
History and Physical    Tracy Espinoza [id number] DOB: May 24, 1946 DOA: 08/02/2016  PCP: Golden Pop, MD  Patient coming from: Home.  Chief Complaint: Fever and chills.  HPI: Tracy Espinoza is a 70 y.o. female with hypertension who had recently undergone lumbar decompression surgery for radiculopathy and was discharged on October 14 and was in rehabilitation last month and did well and has been in home started experiencing fever and chills last 2 days with increasing low back pain. Since symptoms persisted patient came to the ER. Patient was found to be hypotensive and febrile and tachycardic. Patient was placed on fluid bolus for sepsis protocol. UA shows positive leukocyte esterase with bacteria. Patient is being admitted for sepsis most likely secondary to UTI. Since patient also has benign increasing low back pain MRI of the L-spine has been ordered which is pending. As per the nurse and the ER physician patient had some discharge from the surgical site which was serosanguineous. Since patient has significant pain on turning I was not able to examine the back.  ED Course: Fluid bolus has been started. Patient started developing rash in her right upper extremity after being given vancomycin. Vancomycin was discontinued and started on Zyvox.  Review of Systems: As per HPI, rest all negative.   Past Medical History:  Diagnosis Date  . Anxiety   . Arthritis   . Cancer ([place])    BASAL CELL SKIN CANCERS REMOVED  . GERD (gastroesophageal reflux disease)   . History of hiatal hernia   . Hypertension     Past Surgical History:  Procedure Laterality Date  . ABDOMINAL HYSTERECTOMY    . APPENDECTOMY    . CHOLECYSTECTOMY    . FRACTURE SURGERY     BONE ON SIDE OF RIGHT FOOT  . HERNIA REPAIR     UMBILICAL X 2  . JOINT REPLACEMENT Bilateral    knees  . REPAIR OF LEFT URETER Left 40 YRS AGO  . ROTATOR CUFF REPAIR  2014  . TONSILLECTOMY    . TUBAL LIGATION       reports that she has  never smoked. She has never used smokeless tobacco. She reports that she drinks about 1.8 oz of alcohol per week . She reports that she does not use drugs.  Allergies  Allergen Reactions  . Tape Itching, Dermatitis and Rash  . Band-Aid Plus Antibiotic [Bacitracin-Polymyxin B] Other (See Comments)    Unknown  . Iodinated Diagnostic Agents Itching  . Morphine Sulfate Itching    Family History  Problem Relation Age of Onset  . Hypertension Mother   . Stroke Mother   . Alzheimer's disease Mother   . Cancer Father     brain  . Hypertension Father   . Stroke Father     Prior to Admission medications   Medication Sig Start Date End Date Taking? Authorizing Provider  acetaminophen (TYLENOL) 325 MG tablet Take 650 mg by mouth every 6 (six) hours as needed for mild pain.   Yes Historical Provider, MD  acidophilus (RISAQUAD) CAPS capsule Take 1 capsule by mouth daily.   Yes Historical Provider, MD  amLODipine (NORVASC) 5 MG tablet Take 1 tablet (5 mg total) by mouth daily. 04/27/16  Yes Guadalupe Maple, MD  ARTIFICIAL TEARS 0.1-0.3 % SOLN Place 1 drop into both eyes daily as needed (dry eyes).   Yes Historical Provider, MD  Ascorbic Acid (VITAMIN C) 1000 MG tablet Take 1,000 mg by mouth daily.   Yes Historical  Provider, MD  aspirin EC 81 MG tablet Take 81 mg by mouth daily.   Yes Historical Provider, MD  Calcium Carb-Cholecalciferol (CALCIUM 600+D) 600-800 MG-UNIT TABS Take 1 tablet by mouth daily.    Yes Historical Provider, MD  Cholecalciferol (D3-1000) 1000 units capsule Take 1,000 Units by mouth daily.   Yes Historical Provider, MD  ciprofloxacin (CIPRO) 500 MG tablet Take 500 mg by mouth 2 (two) times daily. Started 10/24, for 14 days ending 11/7   Yes Historical Provider, MD  conjugated estrogens (PREMARIN) vaginal cream Apply a blueberry sized amount to urethra using fingertip nightly x 2 weeks then 3 times weekly at bedtime. 09/15/15  Yes Roda Shutters, FNP  CRANBERRY PO Take 1-2  capsules by mouth See admin instructions. Take 1 capsule by mouth in the morning and take 2 capsules by mouth in the evening.   Yes Historical Provider, MD  hydrochlorothiazide (HYDRODIURIL) 25 MG tablet Take 1 tablet (25 mg total) by mouth daily. 04/27/16  Yes Guadalupe Maple, MD  LORazepam (ATIVAN) 1 MG tablet Take 1 tablet (1 mg total) by mouth daily as needed for anxiety. 07/04/16  Yes Guadalupe Maple, MD  losartan (COZAAR) 100 MG tablet Take 1 tablet (100 mg total) by mouth daily. 04/27/16  Yes Guadalupe Maple, MD  magnesium oxide (MAG-OX) 400 MG tablet Take 400 mg by mouth 2 (two) times daily.   Yes Historical Provider, MD  Melatonin 10 MG CAPS Take 10 mg by mouth at bedtime.    Yes Historical Provider, MD  meloxicam (MOBIC) 15 MG tablet Take 1 tablet (15 mg total) by mouth daily as needed for pain. Patient taking differently: Take 15 mg by mouth daily.  04/27/16  Yes Guadalupe Maple, MD  methocarbamol (ROBAXIN) 500 MG tablet Take 1 tablet (500 mg total) by mouth every 6 (six) hours as needed for muscle spasms. Patient taking differently: Take 500 mg by mouth every 6 (six) hours.  07/14/16  Yes Kristeen Miss, MD  Misc Natural Products (TURMERIC CURCUMIN) CAPS Take 1 capsule by mouth 2 (two) times daily.    Yes Historical Provider, MD  Multiple Vitamin (MULTIVITAMIN) tablet Take 1 tablet by mouth daily.   Yes Historical Provider, MD  omeprazole (PRILOSEC) 40 MG capsule Take 1 capsule (40 mg total) by mouth 2 (two) times daily. 04/27/16  Yes Guadalupe Maple, MD  oxycodone (OXY-IR) 5 MG capsule Take 10 mg by mouth every 4 (four) hours.   Yes Historical Provider, MD  oxyCODONE-acetaminophen (PERCOCET/ROXICET) 5-325 MG tablet Take 1-2 tablets by mouth every 4 (four) hours as needed for moderate pain. Patient taking differently: Take 2 tablets by mouth every 4 (four) hours as needed for moderate pain.  07/14/16  Yes Kristeen Miss, MD  polyvinyl alcohol (LIQUIFILM TEARS) 1.4 % ophthalmic solution 1 drop as  needed for dry eyes.   Yes Historical Provider, MD  Potassium 99 MG TABS Take 99 mg by mouth daily.    Yes Historical Provider, MD  traMADol (ULTRAM) 50 MG tablet Take 1 tablet (50 mg total) by mouth every 6 (six) hours as needed (1-2 Tab). Patient taking differently: Take 100 mg by mouth every 6 (six) hours as needed for moderate pain.  02/29/16  Yes Guadalupe Maple, MD  traZODone (DESYREL) 50 MG tablet Take 1 tablet (50 mg total) by mouth at bedtime. 04/27/16  Yes Guadalupe Maple, MD  Turmeric Curcumin 500 MG CAPS Take 500 mg by mouth 2 (two) times daily.  Yes Historical Provider, MD  vitamin E (VITAMIN E) 400 UNIT capsule Take 400 Units by mouth daily.   Yes Historical Provider, MD    Physical Exam: Vitals:   08/02/16 2130 08/02/16 2149 08/02/16 2200 08/02/16 2230  BP: 113/62  (!) 92/46 (!) 99/42  Pulse: 87  82 79  Resp: 21  23 22   Temp:  100.4 F (38 C)    TempSrc:  Oral    SpO2: (!) 89%  97% 97%      Constitutional: Moderately built and nourished. Vitals:   08/02/16 2130 08/02/16 2149 08/02/16 2200 08/02/16 2230  BP: 113/62  (!) 92/46 (!) 99/42  Pulse: 87  82 79  Resp: 21  23 22   Temp:  100.4 F (38 C)    TempSrc:  Oral    SpO2: (!) 89%  97% 97%   Eyes: Anicteric no pallor. ENMT: No discharge from the ears eyes nose and mouth. Neck: No mass felt. No neck rigidity. Respiratory: No rhonchi or crepitations. Cardiovascular: S1 and S2 heard. No murmurs appreciated. Abdomen: Soft nontender bowel sounds present. No guarding or rigidity. Musculoskeletal: No edema. No joint effusion. Skin: As per thepatient has some discharge from the surgical site which was serosanguineous. Difficult to assess because of severe pain. Neurologic: Alert awake oriented to time place and person. Moves all extremities. Psychiatric: Appears normal. Normal affect.   Labs on Admission: I have personally reviewed following labs and imaging studies  CBC:  Recent Labs Lab 07/31/16 1200  08/02/16 2015  WBC 10.9 14.9*  NEUTROABS 6.8* 11.5*  HGB 11.2* 10.2*  HCT 33.4* 32.3*  MCV 89.7 92.3  PLT 281 [id number]   Basic Metabolic Panel:  Recent Labs Lab 08/02/16 2015  NA 134*  K 3.4*  CL 96*  CO2 26  GLUCOSE 192*  BUN 33*  CREATININE 1.13*  CALCIUM 8.4*   GFR: CrCl cannot be calculated (Unknown ideal weight.). Liver Function Tests:  Recent Labs Lab 08/02/16 2015  AST 31  ALT 14  ALKPHOS 85  BILITOT 1.0  PROT 6.1*  ALBUMIN 2.7*   No results for input(s): LIPASE, AMYLASE in the last 168 hours. No results for input(s): AMMONIA in the last 168 hours. Coagulation Profile: No results for input(s): INR, PROTIME in the last 168 hours. Cardiac Enzymes: No results for input(s): CKTOTAL, CKMB, CKMBINDEX, TROPONINI in the last 168 hours. BNP (last 3 results) No results for input(s): PROBNP in the last 8760 hours. HbA1C: No results for input(s): HGBA1C in the last 72 hours. CBG: No results for input(s): GLUCAP in the last 168 hours. Lipid Profile: No results for input(s): CHOL, HDL, LDLCALC, TRIG, CHOLHDL, LDLDIRECT in the last 72 hours. Thyroid Function Tests: No results for input(s): TSH, T4TOTAL, FREET4, T3FREE, THYROIDAB in the last 72 hours. Anemia Panel: No results for input(s): VITAMINB12, FOLATE, FERRITIN, TIBC, IRON, RETICCTPCT in the last 72 hours. Urine analysis:    Component Value Date/Time   COLORURINE AMBER (A) 08/02/2016 2015   APPEARANCEUR CLEAR 08/02/2016 2015   APPEARANCEUR Clear 07/03/2016 1137   LABSPEC 1.016 08/02/2016 2015   PHURINE 6.0 08/02/2016 2015   GLUCOSEU NEGATIVE 08/02/2016 2015   HGBUR NEGATIVE 08/02/2016 2015   BILIRUBINUR NEGATIVE 08/02/2016 2015   BILIRUBINUR Negative 07/03/2016 [place] 08/02/2016 2015   PROTEINUR NEGATIVE 08/02/2016 2015   NITRITE NEGATIVE 08/02/2016 2015   LEUKOCYTESUR TRACE (A) 08/02/2016 2015   LEUKOCYTESUR Negative 07/03/2016 1137   Sepsis  Labs: @LABRCNTIP (procalcitonin:4,lacticidven:4) )No results found for this or  any previous visit (from the past 240 hour(s)).   Radiological Exams on Admission: Dg Chest Port 1 View  Result Date: 08/02/2016 CLINICAL DATA:  Postop fever, hypoxia, and tachypnea. Recent back surgery. EXAM: PORTABLE CHEST 1 VIEW COMPARISON:  05/03/2011 FINDINGS: Mild cardiomegaly and tortuosity of thoracic aorta again noted. Aortic atherosclerosis. Both lungs are clear. No evidence of pneumothorax or pleural effusion. IMPRESSION: Stable mild cardiomegaly and aortic atherosclerosis. No active lung disease. Electronically Signed   By: Earle Gell M.D.   On: 08/02/2016 20:20    EKG: Independently reviewed - normal sinus rhythm with nonspecific ST changes.  Assessment/Plan Principal Problem:   Sepsis ([place]) Active Problems:   Hypertension   Normocytic normochromic anemia   ARF (acute renal failure) ([place])    1. Sepsis - source would be UTI. However since patient has increasing back pain at the surgical site with discharge MRI of the L-spine has been ordered. Check influenza PCR. Continue with hydration and empiric antibiotics. 2. Acute renal failure probably from hypotension and also dehydration. Patient is on meloxicam which needs to be discontinued - continue with aggressive hydration and follow metabolic panel. 3. Hypertension - due to hypotension holding antihypertensives. 4. Recent lumbar surgery with low back pain - follow-up MRI results. Patient is on pain relief medications. 5. Anemia - probably from blood loss anemia from postoperative. Follow CBC. 6. Allergic reaction to vancomycin in the ER. Patient developed rash in the right upper extremity after vancomycin was given and was discontinued. Pharmacy alerted.   DVT prophylaxis: SCDs. May change to Lovenox if MRI does not show any abscess.  Code Status: Full code.  Family Communication: Discussed with patient.  Disposition Plan: To be determined.   Consults called: None.  Admission status: Inpatient. Likely stay 3-4 days.    Rise Patience MD [facility] Pager [phone].  If 7PM-7AM, please contact night-coverage [email or web address] Password [facility]  08/03/2016, 12:47 AM

## 2016-08-04 ENCOUNTER — Inpatient Hospital Stay (HOSPITAL_COMMUNITY): Payer: Medicare Other

## 2016-08-04 ENCOUNTER — Encounter (HOSPITAL_COMMUNITY): Payer: Self-pay | Admitting: Neurological Surgery

## 2016-08-04 DIAGNOSIS — T814XXA Infection following a procedure, initial encounter: Principal | ICD-10-CM

## 2016-08-04 DIAGNOSIS — T368X5A Adverse effect of other systemic antibiotics, initial encounter: Secondary | ICD-10-CM

## 2016-08-04 DIAGNOSIS — B9561 Methicillin susceptible Staphylococcus aureus infection as the cause of diseases classified elsewhere: Secondary | ICD-10-CM

## 2016-08-04 DIAGNOSIS — T148XXA Other injury of unspecified body region, initial encounter: Secondary | ICD-10-CM

## 2016-08-04 DIAGNOSIS — S31000D Unspecified open wound of lower back and pelvis without penetration into retroperitoneum, subsequent encounter: Secondary | ICD-10-CM

## 2016-08-04 DIAGNOSIS — Y838 Other surgical procedures as the cause of abnormal reaction of the patient, or of later complication, without mention of misadventure at the time of the procedure: Secondary | ICD-10-CM

## 2016-08-04 DIAGNOSIS — N179 Acute kidney failure, unspecified: Secondary | ICD-10-CM

## 2016-08-04 DIAGNOSIS — L270 Generalized skin eruption due to drugs and medicaments taken internally: Secondary | ICD-10-CM

## 2016-08-04 DIAGNOSIS — D649 Anemia, unspecified: Secondary | ICD-10-CM

## 2016-08-04 DIAGNOSIS — A4102 Sepsis due to Methicillin resistant Staphylococcus aureus: Secondary | ICD-10-CM

## 2016-08-04 LAB — BASIC METABOLIC PANEL
ANION GAP: 5 (ref 5–15)
BUN: 19 mg/dL (ref 6–20)
CHLORIDE: 105 mmol/L (ref 101–111)
CO2: 29 mmol/L (ref 22–32)
Calcium: 7.9 mg/dL — ABNORMAL LOW (ref 8.9–10.3)
Creatinine, Ser: 0.82 mg/dL (ref 0.44–1.00)
GFR calc Af Amer: >60 mL/min (ref >60)
GFR calc non Af Amer: >60 mL/min (ref >60)
GLUCOSE: 135 mg/dL — AB (ref 65–99)
POTASSIUM: 4 mmol/L (ref 3.5–5.1)
Sodium: 139 mmol/L (ref 135–145)

## 2016-08-04 LAB — CBC
HEMATOCRIT: 28.8 % — AB (ref 36.0–46.0)
Hemoglobin: 8.8 g/dL — ABNORMAL LOW (ref 12.0–15.0)
MCH: 28.9 pg (ref 26.0–34.0)
MCHC: 30.6 g/dL (ref 30.0–36.0)
MCV: 94.4 fL (ref 78.0–100.0)
Platelets: 214 10*3/uL (ref 150–400)
RBC: 3.05 MIL/uL — AB (ref 3.87–5.11)
RDW: 14.3 % (ref 11.5–15.5)
WBC: 8.7 10*3/uL (ref 4.0–10.5)

## 2016-08-04 MED ORDER — OXYCODONE HCL 5 MG PO TABS
10.0000 mg | ORAL_TABLET | Freq: Three times a day (TID) | ORAL | Status: DC
  Administered 2016-08-04 – 2016-08-08 (×13): 10 mg via ORAL
  Filled 2016-08-04 (×13): qty 2

## 2016-08-04 MED ORDER — SENNA 8.6 MG PO TABS
1.0000 | ORAL_TABLET | Freq: Two times a day (BID) | ORAL | Status: DC
  Administered 2016-08-04 – 2016-08-08 (×8): 8.6 mg via ORAL
  Filled 2016-08-04 (×8): qty 1

## 2016-08-04 MED ORDER — POLYETHYLENE GLYCOL 3350 17 G PO PACK
17.0000 g | PACK | Freq: Every day | ORAL | Status: DC
  Administered 2016-08-04 – 2016-08-07 (×3): 17 g via ORAL
  Filled 2016-08-04 (×3): qty 1

## 2016-08-04 MED ORDER — HYDROMORPHONE HCL 1 MG/ML IJ SOLN: 1.0000 mg | INTRAMUSCULAR | Status: DC | PRN

## 2016-08-04 MED ORDER — ORAL CARE MOUTH RINSE
15.0000 mL | Freq: Two times a day (BID) | OROMUCOSAL | Status: DC
  Administered 2016-08-04 – 2016-08-07 (×5): 15 mL via OROMUCOSAL

## 2016-08-04 MED ORDER — SODIUM CHLORIDE 0.9 % IV SOLN
INTRAVENOUS | Status: AC
  Administered 2016-08-04: 14:00:00 via INTRAVENOUS
  Administered 2016-08-05: 75 mL via INTRAVENOUS

## 2016-08-04 NOTE — Evaluation (Signed)
Occupational Therapy Evaluation Patient Details Name: Tracy Espinoza MRN: [id number] DOB: [birth year] Today's Date: 08/04/2016    History of Present Illness pt is a 69 y/o female with h/o HTN, TKA, rot. cuff surgery, recent L45 decompressive lami and PLA, admitted 2 days after d/c home from SNF with sepsis. s/p I&D of back.   Clinical Impression   Pt was functioning at a modified independent level in ADL, ambulating with a walker and sleeping in a lift chair prior to this admission. Pt presents with back pain, generalized weakness and poor balance. Pt currently requires 2 person assist for mobility and reported feeling as if her legs were going to buckle with transfers to [facility] and then to chair. Recommending return to [facility] prior to going back home with assist of her family. Will follow acutely.   Follow Up Recommendations  SNF    Equipment Recommendations       Recommendations for Other Services       Precautions / Restrictions Precautions Precautions: Back;Fall Precaution Comments: reviewed precautions Restrictions Weight Bearing Restrictions: No Other Position/Activity Restrictions: per MD order, does not require back brace, but pt preferring to use her lumbar corset      Mobility Bed Mobility Overal bed mobility: Needs Assistance;+2 for physical assistance Bed Mobility: Rolling;Sidelying to Sit Rolling: +2 for physical assistance;Max assist Sidelying to sit: +2 for physical assistance;Max assist       General bed mobility comments: cues for log roll technique, assist for all aspects  Transfers Overall transfer level: Needs assistance Equipment used: Rolling walker (2 wheeled) Transfers: Sit to/from Stand Sit to Stand: +2 physical assistance;Min assist         General transfer comment: from elevated bed and BSC, cues for hand placement    Balance     Sitting balance-Leahy Scale: Fair       Standing balance-Leahy Scale: Poor Standing balance comment:  requires one hand on walker for balance                            ADL Overall ADL's : Needs assistance/impaired Eating/Feeding: Independent;Sitting   Grooming: Wash/dry hands;Wash/dry face;Sitting;Set up   Upper Body Bathing: Minimal assitance;Sitting   Lower Body Bathing: Total assistance;Sit to/from stand   Upper Body Dressing : Minimal assistance;Sitting   Lower Body Dressing: Total assistance;Sit to/from stand   Toilet Transfer: +2 for physical assistance;Minimal assistance;Stand-pivot;BSC;RW   Toileting- Clothing Manipulation and Hygiene: Minimal assistance;Sit to/from stand         General ADL Comments: Pt was using AE for LB ADL at home.     Vision     Perception     Praxis      Pertinent Vitals/Pain Pain Assessment: 0-10 Pain Score: 8  Pain Location: back Pain Descriptors / Indicators: Aching;Guarding;Grimacing Pain Intervention(s): Limited activity within patient's tolerance;Repositioned;Premedicated before session;Monitored during session;Patient requesting pain meds-RN notified     Hand Dominance Right   Extremity/Trunk Assessment Upper Extremity Assessment Upper Extremity Assessment: Overall WFL for tasks assessed   Lower Extremity Assessment Lower Extremity Assessment: Defer to PT evaluation       Communication Communication Communication: No difficulties   Cognition Arousal/Alertness: Awake/alert Behavior During Therapy: WFL for tasks assessed/performed Overall Cognitive Status: Impaired/Different from baseline Area of Impairment: Memory               General Comments: daughter assisting pt in recalling PLOF and home set up   General Comments  Exercises       Shoulder Instructions      Home Living Family/patient expects to be discharged to:: Private residence Living Arrangements: Alone Available Help at Discharge: Family;Available 24 hours/day Type of Home: House Home Access: Ramped entrance     Home  Layout: One level     Bathroom Shower/Tub: Occupational psychologist: Handicapped height     Home Equipment: Environmental consultant - 2 wheels;Walker - 4 wheels;Shower seat - built in;Hand held shower head;Grab bars - tub/shower;Grab bars - toilet;Adaptive equipment (lift chair) Adaptive Equipment: Reacher;Sock aid;Long-handled sponge Additional Comments: Released from SNF rehab 2 days prior to admission.      Prior Functioning/Environment                   OT Problem List: Decreased strength;Decreased activity tolerance;Impaired balance (sitting and/or standing);Decreased safety awareness;Decreased knowledge of use of DME or AE;Decreased knowledge of precautions;Obesity;Pain;Impaired UE functional use;Decreased cognition   OT Treatment/Interventions: Self-care/ADL training;DME and/or AE instruction;Therapeutic activities;Patient/family education;Balance training;Cognitive remediation/compensation    OT Goals(Current goals can be found in the care plan section) Acute Rehab OT Goals Patient Stated Goal: return to independence OT Goal Formulation: With patient Time For Goal Achievement: 08/18/16 Potential to Achieve Goals: Good ADL Goals Pt Will Perform Grooming: with supervision;standing Pt Will Perform Lower Body Bathing: with supervision;sit to/from stand;with adaptive equipment Pt Will Perform Lower Body Dressing: with supervision;with adaptive equipment;sit to/from stand Pt Will Transfer to Toilet: with supervision;ambulating;bedside commode (over toilet)  OT Frequency: Min 2X/week   Barriers to D/C:            Co-evaluation PT/OT/SLP Co-Evaluation/Treatment: Yes Reason for Co-Treatment: For patient/therapist safety   OT goals addressed during session: ADL's and self-care      End of Session Equipment Utilized During Treatment: Rolling walker;Back brace;Oxygen;Gait belt Nurse Communication: Mobility status  Activity Tolerance: Patient limited by pain;Patient limited  by fatigue Patient left: in chair;with call bell/phone within reach;with family/visitor present   Time: 1101-1148 OT Time Calculation (min): 47 min Charges:  OT General Charges $OT Visit: 1 Procedure OT Evaluation $OT Eval Moderate Complexity: 1 Procedure G-Codes:    Malka So 08/04/2016, 1:33 PM  [phone]

## 2016-08-04 NOTE — Evaluation (Signed)
Physical Therapy Evaluation Patient Details Name: Tracy Espinoza MRN: [id number] DOB: [birth year] Today's Date: 08/04/2016   History of Present Illness  pt is a 70 y/o female with h/o HTN, TKA, rot. cuff surgery, recent L45 decompressive lami and PLA, admitted 2 days after d/c home from SNF with sepsis. s/p I&D of back.  Clinical Impression  Pt presented supine in bed with HOB elevated, awake and willing to participate in therapy session. Pt's daughter was present throughout session as well. Prior to admission, pt was home for two days from SNF following her lumbar spinal decompression surgery on 07/09/16. Pt required max A x2 with bed mobility and min A x2 for STS and SPT to recliner. Pt would continue to benefit from skilled physical therapy services at this time while admitted and after d/c to address her below listed limitations in order to improve her overall safety and independence with functional mobility.     Follow Up Recommendations SNF    Equipment Recommendations  None recommended by PT    Recommendations for Other Services       Precautions / Restrictions Precautions Precautions: Back;Fall Precaution Comments: reviewed spinal precautions Restrictions Weight Bearing Restrictions: No Other Position/Activity Restrictions: per MD order, does not require back brace, but pt preferring to use her lumbar corset (donned in sitting)      Mobility  Bed Mobility Overal bed mobility: Needs Assistance;+2 for physical assistance Bed Mobility: Rolling;Sidelying to Sit Rolling: +2 for physical assistance;Max assist Sidelying to sit: +2 for physical assistance;Max assist       General bed mobility comments: pt required cueing for log roll technique and max A x2 to achieve sitting EOB at bilateral LEs and trunk  Transfers Overall transfer level: Needs assistance Equipment used: Rolling walker (2 wheeled) Transfers: Sit to/from [facility] Sit to Stand: +2 physical  assistance;Min assist Stand pivot transfers: +2 safety/equipment;Min assist       General transfer comment: from elevated bed and BSC, cues for hand placement  Ambulation/Gait                Stairs            Wheelchair Mobility    Modified Rankin (Stroke Patients Only)       Balance Overall balance assessment: Needs assistance Sitting-balance support: Feet supported;No upper extremity supported Sitting balance-Leahy Scale: Fair     Standing balance support: During functional activity;Single extremity supported Standing balance-Leahy Scale: Poor Standing balance comment: pt able to stand and wipe herself with one UE on RW                             Pertinent Vitals/Pain Pain Assessment: 0-10 Pain Score: 8  Pain Location: back Pain Descriptors / Indicators: Sore;Grimacing;Moaning Pain Intervention(s): Monitored during session;Repositioned;Limited activity within patient's tolerance    Home Living Family/patient expects to be discharged to:: Private residence Living Arrangements: Alone Available Help at Discharge: Family;Available 24 hours/day Type of Home: House Home Access: Ramped entrance     Home Layout: One level Home Equipment: Walker - 2 wheels;Walker - 4 wheels;Shower seat - built in;Hand held shower head;Grab bars - tub/shower;Grab bars - toilet;Adaptive equipment (lift chair) Additional Comments: Released from SNF rehab 2 days prior to admission.    Prior Function Level of Independence: Independent with assistive device(s)         Comments: pt stated that she had progressed to ambulating with her cane  Hand Dominance   Dominant Hand: Right    Extremity/Trunk Assessment   Upper Extremity Assessment: Defer to OT evaluation           Lower Extremity Assessment: Overall WFL for tasks assessed         Communication   Communication: No difficulties  Cognition Arousal/Alertness: Awake/alert Behavior During  Therapy: WFL for tasks assessed/performed Overall Cognitive Status: Impaired/Different from baseline Area of Impairment: Memory               General Comments: daughter assisting pt in recalling PLOF and home set up    General Comments      Exercises     Assessment/Plan    PT Assessment Patient needs continued PT services  PT Problem List Decreased strength;Decreased activity tolerance;Decreased balance;Decreased mobility;Decreased coordination;Pain          PT Treatment Interventions Gait training;Functional mobility training;Therapeutic activities;Therapeutic exercise;Balance training;Neuromuscular re-education;Patient/family education    PT Goals (Current goals can be found in the Care Plan section)  Acute Rehab PT Goals Patient Stated Goal: return to independence PT Goal Formulation: With patient/family Time For Goal Achievement: 08/18/16 Potential to Achieve Goals: Good    Frequency Min 3X/week   Barriers to discharge        Co-evaluation PT/OT/SLP Co-Evaluation/Treatment: Yes Reason for Co-Treatment: For patient/therapist safety PT goals addressed during session: Mobility/safety with mobility;Proper use of DME;Balance;Strengthening/ROM OT goals addressed during session: ADL's and self-care       End of Session Equipment Utilized During Treatment: Gait belt;Back brace Activity Tolerance: Patient limited by pain Patient left: in chair;with call bell/phone within reach;with family/visitor present Nurse Communication: Mobility status         Time: [id number] PT Time Calculation (min) (ACUTE ONLY): 50 min   Charges:   PT Evaluation $PT Eval Moderate Complexity: 1 Procedure PT Treatments $Therapeutic Activity: 8-22 mins   PT G CodesClearnce Sorrel Latayna Ritchie 08/04/2016, 2:06 PM Sherie Don, [place], DPT [phone]

## 2016-08-04 NOTE — Care Management Note (Addendum)
Case Management Note  Patient Details  Name: Tracy Espinoza MRN: [id number] Date of Birth: 01/22/1946  Subjective/Objective: Pt presented for worsening lower back pain with drainage from her incision site from recent Lumbar Surgery. Pt with increased temp and chills. Pt continues on IV antibiotic therapy. Previously pt had a short stent at a SNF [facility]. PT/T recommendations for SNF once stable for d/c.                    Action/Plan: CM did speak with CSW [place] and CSW is following the case. CM will continue to monitor for additional needs.    Addendum 08/07/16, Patient not accepted at [facility], due to cost of IV Abx, Was accepted at [facility], and agreed to go at DC. PICC in place awaiting TEE.   Expected Discharge Date:                  Expected Discharge Plan:  Skilled Nursing Facility  In-House Referral:  Clinical Social Work  Discharge planning Services  CM Consult  Post Acute Care Choice:    Choice offered to:     DME Arranged:    DME Agency:     HH Arranged:    [place] Agency:     Status of Service:  In process, will continue to follow  If discussed at Long Length of Stay Meetings, dates discussed:    Additional Comments:  Bethena Roys, RN 08/04/2016, 3:37 PM

## 2016-08-04 NOTE — Progress Notes (Signed)
[facility] PROGRESS NOTE  PANSY BOSLER [id number] DOB: 03/14/1946 DOA: 08/02/2016  PCP: Golden Pop, MD  Brief History/Interval Summary:  70 year old Caucasian female who is morbidly obese, who has a past medical history of hypertension and underwent lumbar decompression surgery on October 9. She was discharged to rehabilitation. Patient did well at rehabilitation and went back home about a week ago. Over the last 2-3 days she has noticed worsening lower back pain with drainage from her incision site, which has been clear for the most part. And then, she's developed fever and chills. She was hospitalized for further management.  Reason for Visit: Fever and low back pain  Consultants: Neurosurgery  Procedures:  11/2: Irrigation and debridement of large complex lumbar wound infection with debridement of deep tissues and primary closure  Antibiotics: Linezolid and Zosyn stopped on 11/2 Daptomycin 11/2  Subjective/Interval History: Patient continues to have low back pain. She denies any nausea or vomiting. Denies any chest pain or shortness of breath. Overall, does not report any improvement compared to yesterday.   ROS: Denies any nausea, vomiting or diarrhea. Denies any cough or shortness of breath.  Objective:  Vital Signs  Vitals:   08/04/16 0238 08/04/16 0255 08/04/16 0748 08/04/16 1106  BP: 101/61 101/61 116/81 134/72  Pulse: 77 78 (!) 164 79  Resp: 20 18 (!) 26 12  Temp:  99.1 F (37.3 C) (!) 100.4 F (38 C) 98.8 F (37.1 C)  TempSrc:  Oral Oral Oral  SpO2: 99% 100% 93% 96%  Weight:      Height:        Intake/Output Summary (Last 24 hours) at 08/04/16 1200 Last data filed at 08/04/16 0932  Gross per 24 hour  Intake          2178.75 ml  Output              230 ml  Net          1948.75 ml   Filed Weights   08/03/16 0628  Weight: 134.9 kg (297 lb 6.4 oz)    General appearance: alert, cooperative, appears stated age and no distress. Obese. Resp:  clear to auscultation bilaterally Cardio: regular rate and rhythm, S1, S2 normal, no murmur, click, rub or gallop GI: soft, non-tender; bowel sounds normal; no masses,  no organomegaly Extremities: extremities normal, atraumatic, no cyanosis or edema Back: Dressing is noted on the back. Drain tube is present.  Neurologic: Awake and alert. Oriented 3. Cranial nerves II-12 intact. Motor strength equal bilateral upper extremities. Equal in the lower extremities as well.  Lab Results:  Data Reviewed: I have personally reviewed following labs and imaging studies  CBC:  Recent Labs Lab 07/31/16 1200 08/02/16 2015 08/03/16 0335 08/04/16 0741  WBC 10.9 14.9* 9.4 8.7  NEUTROABS 6.8* 11.5* 7.3  --   HGB 11.2* 10.2* 9.7* 8.8*  HCT 33.4* 32.3* 31.3* 28.8*  MCV 89.7 92.3 94.0 94.4  PLT 281 262 195 [id number]    Basic Metabolic Panel:  Recent Labs Lab 08/02/16 2015 08/03/16 0335 08/04/16 0741  NA 134* 135 139  K 3.4* 3.5 4.0  CL 96* 101 105  CO2 26 26 29   GLUCOSE 192* 149* 135*  BUN 33* 28* 19  CREATININE 1.13* 0.94 0.82  CALCIUM 8.4* 7.4* 7.9*    GFR: Estimated Creatinine Clearance: 83.4 mL/min (by C-G formula based on SCr of 0.82 mg/dL).  Liver Function Tests:  Recent Labs Lab 08/02/16 2015 08/03/16 0335  AST 31 30  ALT 14 15  ALKPHOS 85 73  BILITOT 1.0 1.0  PROT 6.1* 5.0*  ALBUMIN 2.7* 2.1*    Coagulation Profile:  Recent Labs Lab 08/03/16 0335  INR 1.19    Cardiac Enzymes:  Recent Labs Lab 08/03/16 0721 08/03/16 1230 08/03/16 2019  CKTOTAL  --  491*  --   TROPONINI 0.09* 0.05* 0.03*     Recent Results (from the past 240 hour(s))  Blood Culture (routine x 2)     Status: Abnormal (Preliminary result)   Collection Time: 08/02/16  8:15 PM  Result Value Ref Range Status   Specimen Description BLOOD LEFT FOREARM  Final   Special Requests BOTTLES DRAWN AEROBIC AND ANAEROBIC 5CC  Final   Culture  Setup Time   Final    GRAM POSITIVE COCCI IN CLUSTERS IN  BOTH AEROBIC AND ANAEROBIC BOTTLES CRITICAL RESULT CALLED TO, READ BACK BY AND VERIFIED WITH: Hughie Closs, [place] AT [place] [address] BY M. WILSON    Culture (A)  Final    STAPHYLOCOCCUS AUREUS SUSCEPTIBILITIES TO FOLLOW    Report Status PENDING  Incomplete  Urine culture     Status: Abnormal (Preliminary result)   Collection Time: 08/02/16  8:15 PM  Result Value Ref Range Status   Specimen Description URINE, CATHETERIZED  Final   Special Requests NONE  Final   Culture (A)  Final    >=100,000 COLONIES/mL STAPHYLOCOCCUS SPECIES (COAGULASE NEGATIVE)   Report Status PENDING  Incomplete  Blood Culture ID Panel (Reflexed)     Status: Abnormal   Collection Time: 08/02/16  8:15 PM  Result Value Ref Range Status   Enterococcus species NOT DETECTED NOT DETECTED Final   Listeria monocytogenes NOT DETECTED NOT DETECTED Final   Staphylococcus species DETECTED (A) NOT DETECTED Final    Comment: CRITICAL RESULT CALLED TO, READ BACK BY AND VERIFIED WITH: M. MACCIA, PHARM AT 1140 ON [address] BY S. YARBROUGH    Staphylococcus aureus DETECTED (A) NOT DETECTED Final    Comment: CRITICAL RESULT CALLED TO, READ BACK BY AND VERIFIED WITH: M. MACCIA, PHARM AT 1140 ON [address] BY S. YARBROUGH    Methicillin resistance DETECTED (A) NOT DETECTED Final    Comment: CRITICAL RESULT CALLED TO, READ BACK BY AND VERIFIED WITH: M. MACCIA, PHARM AT 1140 ON [address] BY S. YARBROUGH    Streptococcus species NOT DETECTED NOT DETECTED Final   Streptococcus agalactiae NOT DETECTED NOT DETECTED Final   Streptococcus pneumoniae NOT DETECTED NOT DETECTED Final   Streptococcus pyogenes NOT DETECTED NOT DETECTED Final   Acinetobacter baumannii NOT DETECTED NOT DETECTED Final   Enterobacteriaceae species NOT DETECTED NOT DETECTED Final   Enterobacter cloacae complex NOT DETECTED NOT DETECTED Final   Escherichia coli NOT DETECTED NOT DETECTED Final   Klebsiella oxytoca NOT DETECTED NOT DETECTED Final   Klebsiella pneumoniae NOT  DETECTED NOT DETECTED Final   Proteus species NOT DETECTED NOT DETECTED Final   Serratia marcescens NOT DETECTED NOT DETECTED Final   Haemophilus influenzae NOT DETECTED NOT DETECTED Final   Neisseria meningitidis NOT DETECTED NOT DETECTED Final   Pseudomonas aeruginosa NOT DETECTED NOT DETECTED Final   Candida albicans NOT DETECTED NOT DETECTED Final   Candida glabrata NOT DETECTED NOT DETECTED Final   Candida krusei NOT DETECTED NOT DETECTED Final   Candida parapsilosis NOT DETECTED NOT DETECTED Final   Candida tropicalis NOT DETECTED NOT DETECTED Final  Blood Culture (routine x 2)     Status: Abnormal (Preliminary result)   Collection Time: 08/02/16  8:35  PM  Result Value Ref Range Status   Specimen Description BLOOD LEFT FOREARM  Final   Special Requests IN PEDIATRIC BOTTLE 1CC  Final   Culture  Setup Time   Final    GRAM POSITIVE COCCI IN CLUSTERS IN PEDIATRIC BOTTLE CRITICAL VALUE NOTED.  VALUE IS CONSISTENT WITH PREVIOUSLY REPORTED AND CALLED VALUE.    Culture STAPHYLOCOCCUS AUREUS (A)  Final   Report Status PENDING  Incomplete  MRSA PCR Screening     Status: Abnormal   Collection Time: 08/03/16  3:40 PM  Result Value Ref Range Status   MRSA by PCR POSITIVE (A) NEGATIVE Final    Comment:        The GeneXpert MRSA Assay (FDA approved for NASAL specimens only), is one component of a comprehensive MRSA colonization surveillance program. It is not intended to diagnose MRSA infection nor to guide or monitor treatment for MRSA infections. RESULT CALLED TO, READ BACK BY AND VERIFIED WITH: Vladimir Creeks RN [id number] 08/03/16 A BROWNING   Aerobic/Anaerobic Culture (surgical/deep wound)     Status: None (Preliminary result)   Collection Time: 08/03/16  5:18 PM  Result Value Ref Range Status   Specimen Description WOUND  Final   Special Requests LUMBAR  Final   Gram Stain   Final    RARE WBC PRESENT,BOTH PMN AND MONONUCLEAR NO ORGANISMS SEEN    Culture PENDING  Incomplete   Report  Status PENDING  Incomplete      Radiology Studies: Mr Lumbar Spine Wo Contrast  Result Date: 08/03/2016 CLINICAL DATA:  70 y/o  F; fever and sepsis. EXAM: MRI LUMBAR SPINE WITHOUT CONTRAST TECHNIQUE: Multiplanar, multisequence MR imaging of the lumbar spine was performed. No intravenous contrast was administered. COMPARISON:  07/27/2016 lumbar radiographs.  03/13/2016 lumbar MRI. FINDINGS: Segmentation:  Standard. Alignment:  Normal lumbar lordosis.  No listhesis. Vertebrae: Status post L4-5 posterior instrumented fusion, interbody fusion, and laminectomy. The vertebral bodies at those levels are partially obscured by susceptibility artifact from the fusion hardware. There is increased T2 signal of the L4-5 intervertebral disc without appreciable abnormal signal in the opposing articular endplates probably representing postsurgical changes related to the interbody fusion. No definite evidence for discitis, fracture, or suspicious osseous lesion. Conus medullaris: Extends to the T12-L1 level and appears normal. Paraspinal and other soft tissues: Atrophic left kidney with multiple cysts. There is a fluid collection within the subcutaneous fat of the back at the level of surgical fusion extending to the myofascial boundary measuring approximately 50 x 70 x 70 mm (AP x ML x CC). Additionally, there is extensive edema in the paraspinal muscles at the site of surgery and at levels of laminectomy. Disc levels: L1-2: Small disc bulge and mild facet hypertrophy greater on the right with mild right-sided foraminal narrowing and lateral recess narrowing. L2-3: Small disc bulge and mild bilateral facet hypertrophy greater on the right. Right greater than left mild foraminal and lateral recess narrowing. L3-4: Small disc bulge and moderate ligamentum flavum/ facet hypertrophy. Mild bilateral foraminal narrowing, right greater than left lateral recess narrowing with contact upon descending right L4 nerve root. Mild canal  stenosis. L4-5: Postsurgical changes related to discectomy and interbody fusion. There is intermediate T1 and high T2 signal within the paraspinal muscles and the laminectomy beds. There is encroachment on the posterior aspect of thecal sac bilaterally with moderate canal stenosis and foraminal narrowing bilaterally. L5-S1: Small central protrusion and mild facet arthropathy without significant foraminal narrowing or canal stenosis. IMPRESSION: 1. Edema within  paraspinal muscles and the laminectomy bed at the L4-5 levels with encroachment on the thecal sac posteriorly at L4-5 with moderate canal stenosis and bilateral foraminal narrowing, probably postsurgical edema. Suboptimal assessment for abscess or epidural fluid collections in the absence of intravenous contrast. 2. Large fluid collection in the subcutaneous fat at level of surgery likely a postoperative seroma. 3. Increased T2 signal within the L4-5 disc space without abnormal signal of opposing endplates is probably postoperative related to interbody fusion. No definite evidence for discitis. 4. Otherwise multilevel degenerative changes with mild foraminal narrowing and mild canal stenosis at the L3-4 level. Electronically Signed   By: Kristine Garbe M.D.   On: 08/03/2016 00:51   Dg Chest Port 1 View  Result Date: 08/02/2016 CLINICAL DATA:  Postop fever, hypoxia, and tachypnea. Recent back surgery. EXAM: PORTABLE CHEST 1 VIEW COMPARISON:  05/03/2011 FINDINGS: Mild cardiomegaly and tortuosity of thoracic aorta again noted. Aortic atherosclerosis. Both lungs are clear. No evidence of pneumothorax or pleural effusion. IMPRESSION: Stable mild cardiomegaly and aortic atherosclerosis. No active lung disease. Electronically Signed   By: Earle Gell M.D.   On: 08/02/2016 20:20     Medications:  Scheduled: . Chlorhexidine Gluconate Cloth  6 each Topical Q0600  . DAPTOmycin (CUBICIN)  IV  1,000 mg Intravenous Q24H  . magnesium oxide  400 mg Oral  BID  . methocarbamol  500 mg Oral Q6H  . multivitamin with minerals  1 tablet Oral Daily  . mupirocin ointment  1 application Nasal BID  . oxyCODONE  10 mg Oral Q8H  . pantoprazole  40 mg Oral Daily  . polyethylene glycol  17 g Oral Daily  . senna  1 tablet Oral BID  . sodium chloride flush  3 mL Intravenous Q12H  . traZODone  50 mg Oral QHS   Continuous: . sodium chloride    . 0.9 % NaCl with KCl 20 mEq / L 75 mL/hr at 08/03/16 2147  . lactated ringers     [id number] **OR** acetaminophen, HYDROmorphone (DILAUDID) injection, LORazepam, menthol-cetylpyridinium **OR** phenol, ondansetron (ZOFRAN) IV, oxyCODONE-acetaminophen, polyvinyl alcohol, sodium chloride flush  Assessment/Plan:  Principal Problem:   Sepsis ([place]) Active Problems:   Hypertension   Normocytic normochromic anemia   ARF (acute renal failure) (HCC)   Wound infection    Sepsis With MRSA bacteremia secondary to operative wound infection Patient's MRI did show a focal fluid collection at her operative site in the back. Patient was seen by neurosurgery and was taken to the OR for irrigation and debridement. Blood cultures growing MRSA. Infectious disease is consulting. Patient currently on daptomycin. Patient apparently had some kind of allergic reaction to vancomycin. Zosyn has been discontinued. Overall, patient is hemodynamically stable. She will need repeat blood cultures tomorrow as long she remains afebrile. And then will have to place PICC line for long-term IV antibiotics subsequently. Patient did have elevated lactic acid level which has improved. Pro-calcitonin level also noted to be elevated.  Recent lumbar surgery with wound infection  See above as well. Neurosurgery is following. Wound care is as per them. Pain control. Pain medication regimen has been adjusted to provide better control. If pain is not adequately controlled despite this, may have to consider using PCA pump.  Acute renal  failure. Secondary to hypotension and dehydration. Meloxicam was discontinued. With hydration renal function has improved. Monitor urine output.  Normocytic anemia. Hemoglobin has trended down some more. Could be due to dilution along with some element of operative blood loss. Continue  to monitor and transfuse as needed. No transfusion needed today.   Mildly elevated troponin. Patient denies any chest pain. EKG without any ischemic changes. Repeat levels. Echocardiogram is pending.  History of essential hypertension. Blood pressure medications on hold as the patient was hypotensive. Blood pressures have improved, though remained borderline low. Continue to hold her medications for now.  Questionable Allergic reaction to vancomycin Patient developed a skin rash at the site of infusion. Vancomycin was stopped.  DVT Prophylaxis: SCDs    Code Status: Full code  Family Communication: Discussed with the patient  Disposition Plan: Continue to monitor. Patient is hemodynamically stable. Can be transferred to the floor. If cleared by neurosurgery. Will need continued hospitalization for now due to above-mentioned issues.    LOS: 2 days   [facility] Pager [phone] 08/04/2016, 12:00 PM  If 7PM-7AM, please contact night-coverage at [email or web address], password [facility]

## 2016-08-04 NOTE — Progress Notes (Addendum)
[facility] for Infectious Disease   Reason for visit: Follow up on MRSA bacteremia  Interval History: had surgical debridement yesterday, on daptomycin, Temp 100.4 this am  Physical Exam: Constitutional:  Vitals:   08/04/16 0748 08/04/16 1106  BP: 116/81 134/72  Pulse:  79  Resp: (!) 26 12  Temp: (!) 100.4 F (38 C) 98.8 F (37.1 C)   patient appears in mild distress with pain, up walking Respiratory: Normal respiratory effort; CTA B Cardiovascular: RRR  Review of Systems: Constitutional: negative for anorexia Gastrointestinal: negative for diarrhea Integument/breast: negative for rash  Lab Results  Component Value Date   WBC 8.7 08/04/2016   HGB 8.8 (L) 08/04/2016   HCT 28.8 (L) 08/04/2016   MCV 94.4 08/04/2016   PLT 214 08/04/2016    Lab Results  Component Value Date   CREATININE 0.82 08/04/2016   BUN 19 08/04/2016   NA 139 08/04/2016   K 4.0 08/04/2016   CL 105 08/04/2016   CO2 29 08/04/2016    Lab Results  Component Value Date   ALT 15 08/03/2016   AST 30 08/03/2016   ALKPHOS 73 08/03/2016     Microbiology: Recent Results (from the past 240 hour(s))  Blood Culture (routine x 2)     Status: Abnormal (Preliminary result)   Collection Time: 08/02/16  8:15 PM  Result Value Ref Range Status   Specimen Description BLOOD LEFT FOREARM  Final   Special Requests BOTTLES DRAWN AEROBIC AND ANAEROBIC 5CC  Final   Culture  Setup Time   Final    GRAM POSITIVE COCCI IN CLUSTERS IN BOTH AEROBIC AND ANAEROBIC BOTTLES CRITICAL RESULT CALLED TO, READ BACK BY AND VERIFIED WITH: Hughie Closs, [place] AT [place] ON [address] BY M. WILSON    Culture (A)  Final    STAPHYLOCOCCUS AUREUS SUSCEPTIBILITIES TO FOLLOW    Report Status PENDING  Incomplete  Urine culture     Status: Abnormal (Preliminary result)   Collection Time: 08/02/16  8:15 PM  Result Value Ref Range Status   Specimen Description URINE, CATHETERIZED  Final   Special Requests NONE  Final   Culture (A)   Final    >=100,000 COLONIES/mL STAPHYLOCOCCUS SPECIES (COAGULASE NEGATIVE)   Report Status PENDING  Incomplete  Blood Culture ID Panel (Reflexed)     Status: Abnormal   Collection Time: 08/02/16  8:15 PM  Result Value Ref Range Status   Enterococcus species NOT DETECTED NOT DETECTED Final   Listeria monocytogenes NOT DETECTED NOT DETECTED Final   Staphylococcus species DETECTED (A) NOT DETECTED Final    Comment: CRITICAL RESULT CALLED TO, READ BACK BY AND VERIFIED WITH: M. MACCIA, PHARM AT 1140 ON [address] BY S. YARBROUGH    Staphylococcus aureus DETECTED (A) NOT DETECTED Final    Comment: CRITICAL RESULT CALLED TO, READ BACK BY AND VERIFIED WITH: M. MACCIA, PHARM AT 1140 ON [address] BY S. YARBROUGH    Methicillin resistance DETECTED (A) NOT DETECTED Final    Comment: CRITICAL RESULT CALLED TO, READ BACK BY AND VERIFIED WITH: M. MACCIA, PHARM AT 1140 ON [address] BY S. YARBROUGH    Streptococcus species NOT DETECTED NOT DETECTED Final   Streptococcus agalactiae NOT DETECTED NOT DETECTED Final   Streptococcus pneumoniae NOT DETECTED NOT DETECTED Final   Streptococcus pyogenes NOT DETECTED NOT DETECTED Final   Acinetobacter baumannii NOT DETECTED NOT DETECTED Final   Enterobacteriaceae species NOT DETECTED NOT DETECTED Final   Enterobacter cloacae complex NOT DETECTED NOT DETECTED Final  Escherichia coli NOT DETECTED NOT DETECTED Final   Klebsiella oxytoca NOT DETECTED NOT DETECTED Final   Klebsiella pneumoniae NOT DETECTED NOT DETECTED Final   Proteus species NOT DETECTED NOT DETECTED Final   Serratia marcescens NOT DETECTED NOT DETECTED Final   Haemophilus influenzae NOT DETECTED NOT DETECTED Final   Neisseria meningitidis NOT DETECTED NOT DETECTED Final   Pseudomonas aeruginosa NOT DETECTED NOT DETECTED Final   Candida albicans NOT DETECTED NOT DETECTED Final   Candida glabrata NOT DETECTED NOT DETECTED Final   Candida krusei NOT DETECTED NOT DETECTED Final   Candida parapsilosis NOT  DETECTED NOT DETECTED Final   Candida tropicalis NOT DETECTED NOT DETECTED Final  Blood Culture (routine x 2)     Status: Abnormal (Preliminary result)   Collection Time: 08/02/16  8:35 PM  Result Value Ref Range Status   Specimen Description BLOOD LEFT FOREARM  Final   Special Requests IN PEDIATRIC BOTTLE 1CC  Final   Culture  Setup Time   Final    GRAM POSITIVE COCCI IN CLUSTERS IN PEDIATRIC BOTTLE CRITICAL VALUE NOTED.  VALUE IS CONSISTENT WITH PREVIOUSLY REPORTED AND CALLED VALUE.    Culture STAPHYLOCOCCUS AUREUS (A)  Final   Report Status PENDING  Incomplete  MRSA PCR Screening     Status: Abnormal   Collection Time: 08/03/16  3:40 PM  Result Value Ref Range Status   MRSA by PCR POSITIVE (A) NEGATIVE Final    Comment:        The GeneXpert MRSA Assay (FDA approved for NASAL specimens only), is one component of a comprehensive MRSA colonization surveillance program. It is not intended to diagnose MRSA infection nor to guide or monitor treatment for MRSA infections. RESULT CALLED TO, READ BACK BY AND VERIFIED WITH: Vladimir Creeks RN [id number] 08/03/16 A BROWNING   Aerobic/Anaerobic Culture (surgical/deep wound)     Status: None (Preliminary result)   Collection Time: 08/03/16  5:18 PM  Result Value Ref Range Status   Specimen Description WOUND  Final   Special Requests LUMBAR  Final   Gram Stain   Final    RARE WBC PRESENT,BOTH PMN AND MONONUCLEAR NO ORGANISMS SEEN    Culture FEW STAPHYLOCOCCUS AUREUS  Final   Report Status PENDING  Incomplete    Impression/Plan:  1. Post op wound infection - MRSA in blood, growing out in culture as well.  On daptomycin and will need a prolonged course of 6 weeks due to bacteremia.  TTE ordered.  Repeat blood cultures tomorrow ordered.  picc line ok Monday if blood cultures remain negative. Treatment through December 15th if repeat blood culture remains negative  2.  Allergy - rash with vancomycin but just local rash on arm.  On daptomycin.     Dr. Baxter Flattery available over the weekend if needed, otherwise I will follow up on Monday. thanks

## 2016-08-05 ENCOUNTER — Other Ambulatory Visit (HOSPITAL_COMMUNITY): Payer: Medicare Other

## 2016-08-05 DIAGNOSIS — R748 Abnormal levels of other serum enzymes: Secondary | ICD-10-CM

## 2016-08-05 LAB — CBC
HCT: 26.8 % — ABNORMAL LOW (ref 36.0–46.0)
HEMOGLOBIN: 8.1 g/dL — AB (ref 12.0–15.0)
MCH: 28.3 pg (ref 26.0–34.0)
MCHC: 30.2 g/dL (ref 30.0–36.0)
MCV: 93.7 fL (ref 78.0–100.0)
PLATELETS: 206 10*3/uL (ref 150–400)
RBC: 2.86 MIL/uL — ABNORMAL LOW (ref 3.87–5.11)
RDW: 14.2 % (ref 11.5–15.5)
WBC: 8.3 10*3/uL (ref 4.0–10.5)

## 2016-08-05 LAB — BASIC METABOLIC PANEL
Anion gap: 3 — ABNORMAL LOW (ref 5–15)
BUN: 16 mg/dL (ref 6–20)
CALCIUM: 8 mg/dL — AB (ref 8.9–10.3)
CO2: 28 mmol/L (ref 22–32)
CREATININE: 0.69 mg/dL (ref 0.44–1.00)
Chloride: 105 mmol/L (ref 101–111)
GFR calc non Af Amer: >60 mL/min (ref >60)
Glucose, Bld: 127 mg/dL — ABNORMAL HIGH (ref 65–99)
Potassium: 3.5 mmol/L (ref 3.5–5.1)
SODIUM: 136 mmol/L (ref 135–145)

## 2016-08-05 LAB — URINE CULTURE: Culture: >=100000 — AB

## 2016-08-05 LAB — CULTURE, BLOOD (ROUTINE X 2)

## 2016-08-05 MED ORDER — HYDROMORPHONE HCL 2 MG/ML IJ SOLN
1.0000 mg | INTRAMUSCULAR | Status: DC | PRN
  Administered 2016-08-06 – 2016-08-07 (×5): 1 mg via INTRAVENOUS
  Filled 2016-08-05 (×5): qty 1

## 2016-08-05 MED ORDER — ENOXAPARIN SODIUM 60 MG/0.6ML ~~LOC~~ SOLN
60.0000 mg | SUBCUTANEOUS | Status: DC
  Administered 2016-08-05 – 2016-08-08 (×4): 60 mg via SUBCUTANEOUS
  Filled 2016-08-05 (×4): qty 0.6

## 2016-08-05 NOTE — Progress Notes (Signed)
Patient ID: Tracy Espinoza, female   DOB: July 28, 1946, 70 y.o.   MRN: [id number] Patient doing well much improved from preop.  Awake alert strength 5 out of 5 wound clean dry and intact  Postop day 2 from I and D of lumbar wound looks much better on antibiotics and Hemovac not holding suction well have a removed. Continue the Vioxx continue mobilization with physical occupational therapy may transfer out of the unit when stable from medical perspective.

## 2016-08-05 NOTE — Progress Notes (Signed)
[facility] PROGRESS NOTE  Tracy Espinoza [id number] DOB: 25-Feb-1946 DOA: 08/02/2016  PCP: Golden Pop, MD  Brief History/Interval Summary:  70 year old Caucasian female who is morbidly obese, who has a past medical history of hypertension and underwent lumbar decompression surgery on October 9. She was discharged to rehabilitation. Patient did well at rehabilitation and went Espinoza home about a week ago. Over the last 2-3 days she has noticed worsening lower Espinoza pain with drainage from her incision site, which has been clear for the most part. And then, she's developed fever and chills. She was hospitalized for further management. She underwent irrigation and debridement of the wound in her Espinoza. She subsequently was found to have MRSA bacteremia.  Reason for Visit: Fever and low Espinoza pain  Consultants: Neurosurgery  Procedures:  11/2: Irrigation and debridement of large complex lumbar wound infection with debridement of deep tissues and primary closure  Antibiotics: Linezolid and Zosyn stopped on 11/2 Daptomycin 11/2  Subjective/Interval History: Patient states that she is starting to feel better. Pain is not as severe as yesterday. Denies any chest pain or shortness of breath.   ROS: Denies any nausea, vomiting or diarrhea.   Objective:  Vital Signs  Vitals:   08/05/16 0115 08/05/16 0340 08/05/16 0725 08/05/16 1139  BP:  114/63 (!) 111/47 105/61  Pulse: 79 77 73 86  Resp: (!) 25 18 19 19   Temp:  98.4 F (36.9 C) 98.1 F (36.7 C) 99.1 F (37.3 C)  TempSrc:  Oral Oral Oral  SpO2: 97% 100% 100% 95%  Weight:      Height:        Intake/Output Summary (Last 24 hours) at 08/05/16 1141 Last data filed at 08/05/16 0600  Gross per 24 hour  Intake             2155 ml  Output              790 ml  Net             1365 ml   Filed Weights   08/03/16 0628  Weight: 134.9 kg (297 lb 6.4 oz)    General appearance: alert, cooperative, appears stated age and no  distress. Obese. Resp: clear to auscultation bilaterally Cardio: regular rate and rhythm, S1, S2 normal, no murmur, click, rub or gallop GI: soft, non-tender; bowel sounds normal; no masses,  no organomegaly. Espinoza: Dressing is noted on the Espinoza. Drain tube is present.  Neurologic: Awake and alert. Oriented 3. Cranial nerves II-12 intact. Motor strength equal bilateral upper extremities. Equal in the lower extremities as well.  Lab Results:  Data Reviewed: I have personally reviewed following labs and imaging studies  CBC:  Recent Labs Lab 07/31/16 1200 08/02/16 2015 08/03/16 0335 08/04/16 0741 08/05/16 0427  WBC 10.9 14.9* 9.4 8.7 8.3  NEUTROABS 6.8* 11.5* 7.3  --   --   HGB 11.2* 10.2* 9.7* 8.8* 8.1*  HCT 33.4* 32.3* 31.3* 28.8* 26.8*  MCV 89.7 92.3 94.0 94.4 93.7  PLT 281 262 195 214 [id number]    Basic Metabolic Panel:  Recent Labs Lab 08/02/16 2015 08/03/16 0335 08/04/16 0741 08/05/16 0427  NA 134* 135 139 136  K 3.4* 3.5 4.0 3.5  CL 96* 101 105 105  CO2 26 26 29 28   GLUCOSE 192* 149* 135* 127*  BUN 33* 28* 19 16  CREATININE 1.13* 0.94 0.82 0.69  CALCIUM 8.4* 7.4* 7.9* 8.0*    GFR: Estimated Creatinine Clearance: 85.5 mL/min (by C-G  formula based on SCr of 0.69 mg/dL).  Liver Function Tests:  Recent Labs Lab 08/02/16 2015 08/03/16 0335  AST 31 30  ALT 14 15  ALKPHOS 85 73  BILITOT 1.0 1.0  PROT 6.1* 5.0*  ALBUMIN 2.7* 2.1*    Coagulation Profile:  Recent Labs Lab 08/03/16 0335  INR 1.19    Cardiac Enzymes:  Recent Labs Lab 08/03/16 0721 08/03/16 1230 08/03/16 2019  CKTOTAL  --  491*  --   TROPONINI 0.09* 0.05* 0.03*     Recent Results (from the past 240 hour(s))  Blood Culture (routine x 2)     Status: Abnormal   Collection Time: 08/02/16  8:15 PM  Result Value Ref Range Status   Specimen Description BLOOD LEFT FOREARM  Final   Special Requests BOTTLES DRAWN AEROBIC AND ANAEROBIC 5CC  Final   Culture  Setup Time   Final    GRAM  POSITIVE COCCI IN CLUSTERS IN BOTH AEROBIC AND ANAEROBIC BOTTLES CRITICAL RESULT CALLED TO, READ Espinoza BY AND VERIFIED WITH: Hughie Closs, PHARM AT [address] ON [address] BY M. WILSON    Culture METHICILLIN RESISTANT STAPHYLOCOCCUS AUREUS (A)  Final   Report Status 08/05/2016 FINAL  Final   Organism ID, Bacteria METHICILLIN RESISTANT STAPHYLOCOCCUS AUREUS  Final      Susceptibility   Methicillin resistant staphylococcus aureus - MIC*    CIPROFLOXACIN >=8 RESISTANT Resistant     ERYTHROMYCIN >=8 RESISTANT Resistant     GENTAMICIN <=0.5 SENSITIVE Sensitive     OXACILLIN >=4 RESISTANT Resistant     TETRACYCLINE <=1 SENSITIVE Sensitive     VANCOMYCIN <=0.5 SENSITIVE Sensitive     TRIMETH/SULFA <=10 SENSITIVE Sensitive     CLINDAMYCIN <=0.25 RESISTANT Resistant     RIFAMPIN <=0.5 SENSITIVE Sensitive     Inducible Clindamycin POSITIVE Resistant     * METHICILLIN RESISTANT STAPHYLOCOCCUS AUREUS  Urine culture     Status: Abnormal   Collection Time: 08/02/16  8:15 PM  Result Value Ref Range Status   Specimen Description URINE, CATHETERIZED  Final   Special Requests NONE  Final   Culture (A)  Final    >=100,000 COLONIES/mL STAPHYLOCOCCUS SPECIES (COAGULASE NEGATIVE)   Report Status 08/05/2016 FINAL  Final   Organism ID, Bacteria STAPHYLOCOCCUS SPECIES (COAGULASE NEGATIVE) (A)  Final      Susceptibility   Staphylococcus species (coagulase negative) - MIC*    CIPROFLOXACIN >=8 RESISTANT Resistant     GENTAMICIN <=0.5 SENSITIVE Sensitive     NITROFURANTOIN <=16 SENSITIVE Sensitive     OXACILLIN >=4 RESISTANT Resistant     TETRACYCLINE <=1 SENSITIVE Sensitive     VANCOMYCIN <=0.5 SENSITIVE Sensitive     TRIMETH/SULFA <=10 SENSITIVE Sensitive     CLINDAMYCIN <=0.25 SENSITIVE Sensitive     RIFAMPIN <=0.5 SENSITIVE Sensitive     Inducible Clindamycin NEGATIVE Sensitive     * >=100,000 COLONIES/mL STAPHYLOCOCCUS SPECIES (COAGULASE NEGATIVE)  Blood Culture ID Panel (Reflexed)     Status: Abnormal    Collection Time: 08/02/16  8:15 PM  Result Value Ref Range Status   Enterococcus species NOT DETECTED NOT DETECTED Final   Listeria monocytogenes NOT DETECTED NOT DETECTED Final   Staphylococcus species DETECTED (A) NOT DETECTED Final    Comment: CRITICAL RESULT CALLED TO, READ Espinoza BY AND VERIFIED WITH: M. MACCIA, PHARM AT 114[address] ON [address] BY S. YARBROUGH    Staphylococcus aureus DETECTED (A) NOT DETECTED Final    Comment: CRITICAL RESULT CALLED TO, READ Espinoza BY AND VERIFIED WITH: M.  [place], [place] [address] BY S. YARBROUGH    Methicillin resistance DETECTED (A) NOT DETECTED Final    Comment: CRITICAL RESULT CALLED TO, READ Espinoza BY AND VERIFIED WITH: M. MACCIA, PHARM AT 1140 ON [address] BY S. YARBROUGH    Streptococcus species NOT DETECTED NOT DETECTED Final   Streptococcus agalactiae NOT DETECTED NOT DETECTED Final   Streptococcus pneumoniae NOT DETECTED NOT DETECTED Final   Streptococcus pyogenes NOT DETECTED NOT DETECTED Final   Acinetobacter baumannii NOT DETECTED NOT DETECTED Final   Enterobacteriaceae species NOT DETECTED NOT DETECTED Final   Enterobacter cloacae complex NOT DETECTED NOT DETECTED Final   Escherichia coli NOT DETECTED NOT DETECTED Final   Klebsiella oxytoca NOT DETECTED NOT DETECTED Final   Klebsiella pneumoniae NOT DETECTED NOT DETECTED Final   Proteus species NOT DETECTED NOT DETECTED Final   Serratia marcescens NOT DETECTED NOT DETECTED Final   Haemophilus influenzae NOT DETECTED NOT DETECTED Final   Neisseria meningitidis NOT DETECTED NOT DETECTED Final   Pseudomonas aeruginosa NOT DETECTED NOT DETECTED Final   Candida albicans NOT DETECTED NOT DETECTED Final   Candida glabrata NOT DETECTED NOT DETECTED Final   Candida krusei NOT DETECTED NOT DETECTED Final   Candida parapsilosis NOT DETECTED NOT DETECTED Final   Candida tropicalis NOT DETECTED NOT DETECTED Final  Blood Culture (routine x 2)     Status: Abnormal   Collection Time: 08/02/16  8:35 PM    Result Value Ref Range Status   Specimen Description BLOOD LEFT FOREARM  Final   Special Requests IN PEDIATRIC BOTTLE 1CC  Final   Culture  Setup Time   Final    GRAM POSITIVE COCCI IN CLUSTERS IN PEDIATRIC BOTTLE CRITICAL VALUE NOTED.  VALUE IS CONSISTENT WITH PREVIOUSLY REPORTED AND CALLED VALUE.    Culture (A)  Final    STAPHYLOCOCCUS AUREUS SUSCEPTIBILITIES PERFORMED ON PREVIOUS CULTURE WITHIN THE LAST 5 DAYS.    Report Status 08/05/2016 FINAL  Final  MRSA PCR Screening     Status: Abnormal   Collection Time: 08/03/16  3:40 PM  Result Value Ref Range Status   MRSA by PCR POSITIVE (A) NEGATIVE Final    Comment:        The GeneXpert MRSA Assay (FDA approved for NASAL specimens only), is one component of a comprehensive MRSA colonization surveillance program. It is not intended to diagnose MRSA infection nor to guide or monitor treatment for MRSA infections. RESULT CALLED TO, READ Espinoza BY AND VERIFIED WITH: Vladimir Creeks RN [id number] 08/03/16 A BROWNING   Aerobic/Anaerobic Culture (surgical/deep wound)     Status: None (Preliminary result)   Collection Time: 08/03/16  5:18 PM  Result Value Ref Range Status   Specimen Description WOUND  Final   Special Requests LUMBAR  Final   Gram Stain   Final    RARE WBC PRESENT,BOTH PMN AND MONONUCLEAR NO ORGANISMS SEEN    Culture FEW STAPHYLOCOCCUS AUREUS  Final   Report Status PENDING  Incomplete      Radiology Studies: No results found.   Medications:  Scheduled: . Chlorhexidine Gluconate Cloth  6 each Topical Q0600  . DAPTOmycin (CUBICIN)  IV  1,000 mg Intravenous Q24H  . enoxaparin (LOVENOX) injection  60 mg Subcutaneous Q24H  . magnesium oxide  400 mg Oral BID  . mouth rinse  15 mL Mouth Rinse BID  . methocarbamol  500 mg Oral Q6H  . multivitamin with minerals  1 tablet Oral Daily  . mupirocin ointment  1 application Nasal  BID  . oxyCODONE  10 mg Oral Q8H  . pantoprazole  40 mg Oral Daily  . polyethylene glycol  17 g Oral  Daily  . senna  1 tablet Oral BID  . sodium chloride flush  3 mL Intravenous Q12H  . traZODone  50 mg Oral QHS   Continuous: . sodium chloride 75 mL (08/05/16 0513)  . lactated ringers     [id number] **OR** acetaminophen, HYDROmorphone (DILAUDID) injection, LORazepam, menthol-cetylpyridinium **OR** phenol, ondansetron (ZOFRAN) IV, oxyCODONE-acetaminophen, polyvinyl alcohol, sodium chloride flush  Assessment/Plan:  Principal Problem:   Sepsis ([place]) Active Problems:   Hypertension   Normocytic normochromic anemia   ARF (acute renal failure) (HCC)   Wound infection    Sepsis With MRSA bacteremia secondary to operative wound infection Patient's MRI did show a focal fluid collection at her operative site in the Espinoza. Patient was seen by neurosurgery and was taken to the OR for irrigation and debridement on 11/2. Blood cultures growing MRSA. Infectious disease is consulting. Patient currently on daptomycin. Patient apparently had some kind of allergic reaction to vancomycin. Zosyn was discontinued. Overall, patient is hemodynamically stable. Blood cultures have been repeated today. If remains negative, PICC line to be placed on Monday for long-term IV antibiotics subsequently. Patient did have elevated lactic acid level which has improved. Pro-calcitonin level also noted to be elevated.   Recent lumbar surgery with wound infection  See above as well. Neurosurgery is following. Wound care is as per them. Pain appears to be reasonably well controlled now.   Acute renal failure. Resolved. Secondary to hypotension and dehydration. Meloxicam was discontinued. With hydration renal function has improved. Monitor urine output.  Normocytic anemia. Hemoglobin continues to trend down slowly without any evidence for overt bleeding. I believe this is dilutional. Transfuse as needed. Continue to monitor for now.   Mildly elevated troponin. Possibly due to demand ischemia. Patient denies any  chest pain. EKG without any ischemic changes. Echocardiogram is pending.  History of essential hypertension. Blood pressure medications on hold as the patient was hypotensive. Blood pressures have improved, though remained borderline low. Continue to hold her medications for now.  Questionable Allergic reaction to vancomycin Patient developed a skin rash at the site of infusion. Vancomycin was stopped.  DVT Prophylaxis: SCDs    Code Status: Full code  Family Communication: Discussed with the patient  Disposition Plan: Patient remained stable overall. Okay for transfer to floor later today.     LOS: 3 days   [facility] Pager [phone] 08/05/2016, 11:41 AM  If 7PM-7AM, please contact night-coverage at [email or web address], password [facility]

## 2016-08-06 ENCOUNTER — Inpatient Hospital Stay (HOSPITAL_COMMUNITY): Payer: Medicare Other

## 2016-08-06 DIAGNOSIS — R9431 Abnormal electrocardiogram [ECG] [EKG]: Secondary | ICD-10-CM

## 2016-08-06 LAB — CBC
HCT: 27.3 % — ABNORMAL LOW (ref 36.0–46.0)
Hemoglobin: 8.2 g/dL — ABNORMAL LOW (ref 12.0–15.0)
MCH: 28.1 pg (ref 26.0–34.0)
MCHC: 30 g/dL (ref 30.0–36.0)
MCV: 93.5 fL (ref 78.0–100.0)
PLATELETS: 246 10*3/uL (ref 150–400)
RBC: 2.92 MIL/uL — ABNORMAL LOW (ref 3.87–5.11)
RDW: 14.1 % (ref 11.5–15.5)
WBC: 10.8 10*3/uL — ABNORMAL HIGH (ref 4.0–10.5)

## 2016-08-06 LAB — BASIC METABOLIC PANEL
Anion gap: 8 (ref 5–15)
BUN: 11 mg/dL (ref 6–20)
CALCIUM: 8.2 mg/dL — AB (ref 8.9–10.3)
CO2: 28 mmol/L (ref 22–32)
CREATININE: 0.62 mg/dL (ref 0.44–1.00)
Chloride: 102 mmol/L (ref 101–111)
Glucose, Bld: 113 mg/dL — ABNORMAL HIGH (ref 65–99)
Potassium: 3.5 mmol/L (ref 3.5–5.1)
SODIUM: 138 mmol/L (ref 135–145)

## 2016-08-06 LAB — CK: CK TOTAL: 90 U/L (ref 38–234)

## 2016-08-06 NOTE — Progress Notes (Signed)
Pt transferred from [facility]. Pt is A&Ox4. Telemetry box 27 applied and second verified by Odessa Fleming. Vital signs stable after transfer. Patient in bed complaining of 8/10 lower back-surgical pain. Scheduled pain meds given.   08/05/16 2111  Vitals  Temp 98.5 F (36.9 C)  Temp Source Oral  BP 123/60  MAP (mmHg) 73  BP Location Right Arm  BP Method Automatic  Patient Position (if appropriate) Lying  Pulse Rate 79  Pulse Rate Source Monitor  Resp 19  Oxygen Therapy  SpO2 100 %

## 2016-08-06 NOTE — Progress Notes (Signed)
  Echocardiogram 2D Echocardiogram has been performed.  Tracy Espinoza 08/06/2016, 5:02 PM

## 2016-08-06 NOTE — Progress Notes (Signed)
Patient ID: Tracy Espinoza, female   DOB: 01-Oct-1946, 70 y.o.   MRN: [id number] Overall patient okay legs feel the same no new numbness or tingling  Strength appears to be 5 out of 5 in her lower extremities incision clean dry and intact  Continue to mobilize physical outpatient therapy continue antibiotics for MRSA bacteremia and wound infection

## 2016-08-06 NOTE — Progress Notes (Addendum)
[facility] PROGRESS NOTE  AADRIKA JUNGBLUTH [id number] DOB: Aug 27, 1946 DOA: 08/02/2016  PCP: Golden Pop, MD  Brief History/Interval Summary:  70 year old Caucasian female who is morbidly obese, who has a past medical history of hypertension and underwent lumbar decompression surgery on October 9. She was discharged to rehabilitation. Patient did well at rehabilitation and went back home about a week ago. Over the last 2-3 days she has noticed worsening lower back pain with drainage from her incision site, which has been clear for the most part. And then, she's developed fever and chills. She was hospitalized for further management. She underwent irrigation and debridement of the wound in her back. She subsequently was found to have MRSA bacteremia.  Reason for Visit: MRSA bacteremia and wound infection  Consultants: Neurosurgery. Infectious disease.  Procedures:  11/2: Irrigation and debridement of large complex lumbar wound infection with debridement of deep tissues and primary closure  Antibiotics: Linezolid and Zosyn stopped on 11/2 Daptomycin 11/2  Subjective/Interval History: Patient feels better, although pain continues to bother her. States that the pain medicine does not last her as long as previously. Also admits to some muscle spasm in the lower back area. Denies any nausea or vomiting.   ROS: Denies any chest pain or shortness of breath.   Objective:  Vital Signs  Vitals:   08/05/16 2000 08/05/16 2111 08/05/16 2116 08/06/16 0347  BP: 127/80 123/60  130/76  Pulse: 84 79  79  Resp: (!) 22 19  19   Temp:  98.5 F (36.9 C)  98.5 F (36.9 C)  TempSrc:  Oral    SpO2: 100% 100%  100%  Weight:   134.8 kg (297 lb 2.9 oz)   Height:   5\' 2"  (1.575 m)     Intake/Output Summary (Last 24 hours) at 08/06/16 1123 Last data filed at 08/06/16 1056  Gross per 24 hour  Intake              328 ml  Output              800 ml  Net             -472 ml   Filed Weights     08/03/16 0628 08/05/16 2116  Weight: 134.9 kg (297 lb 6.4 oz) 134.8 kg (297 lb 2.9 oz)    General appearance: alert, cooperative, appears stated age and no distress. Obese. Resp: Diminished air entry at the bases. No definite crackles or wheezing. Normal effort. Cardio: regular rate and rhythm, S1, S2 normal, no murmur, click, rub or gallop GI: soft, non-tender; bowel sounds normal; no masses,  no organomegaly. Back: Not examined today. Neurologic: Awake and alert. Oriented 3. Cranial nerves II-12 intact. Motor strength equal bilateral upper extremities. Equal in the lower extremities as well.  Lab Results:  Data Reviewed: I have personally reviewed following labs and imaging studies  CBC:  Recent Labs Lab 07/31/16 1200 08/02/16 2015 08/03/16 0335 08/04/16 0741 08/05/16 0427 08/06/16 0505  WBC 10.9 14.9* 9.4 8.7 8.3 10.8*  NEUTROABS 6.8* 11.5* 7.3  --   --   --   HGB 11.2* 10.2* 9.7* 8.8* 8.1* 8.2*  HCT 33.4* 32.3* 31.3* 28.8* 26.8* 27.3*  MCV 89.7 92.3 94.0 94.4 93.7 93.5  PLT 281 262 195 214 206 0000000    Basic Metabolic Panel:  Recent Labs Lab 08/02/16 2015 08/03/16 0335 08/04/16 0741 08/05/16 0427 08/06/16 0505  NA 134* 135 139 136 138  K 3.4* 3.5 4.0 3.5 3.5  CL 96* 101 105 105 102  CO2 26 26 29 28 28   GLUCOSE 192* 149* 135* 127* 113*  BUN 33* 28* 19 16 11   CREATININE 1.13* 0.94 0.82 0.69 0.62  CALCIUM 8.4* 7.4* 7.9* 8.0* 8.2*    GFR: Estimated Creatinine Clearance: 85.5 mL/min (by C-G formula based on SCr of 0.62 mg/dL).  Liver Function Tests:  Recent Labs Lab 08/02/16 2015 08/03/16 0335  AST 31 30  ALT 14 15  ALKPHOS 85 73  BILITOT 1.0 1.0  PROT 6.1* 5.0*  ALBUMIN 2.7* 2.1*    Coagulation Profile:  Recent Labs Lab 08/03/16 0335  INR 1.19    Cardiac Enzymes:  Recent Labs Lab 08/03/16 0721 08/03/16 1230 08/03/16 2019  CKTOTAL  --  491*  --   TROPONINI 0.09* 0.05* 0.03*     Recent Results (from the past 240 hour(s))  Blood  Culture (routine x 2)     Status: Abnormal   Collection Time: 08/02/16  8:15 PM  Result Value Ref Range Status   Specimen Description BLOOD LEFT FOREARM  Final   Special Requests BOTTLES DRAWN AEROBIC AND ANAEROBIC 5CC  Final   Culture  Setup Time   Final    GRAM POSITIVE COCCI IN CLUSTERS IN BOTH AEROBIC AND ANAEROBIC BOTTLES CRITICAL RESULT CALLED TO, READ BACK BY AND VERIFIED WITH: Hughie Closs, PHARM AT [place] ON [address] BY M. WILSON    Culture METHICILLIN RESISTANT STAPHYLOCOCCUS AUREUS (A)  Final   Report Status 08/05/2016 FINAL  Final   Organism ID, Bacteria METHICILLIN RESISTANT STAPHYLOCOCCUS AUREUS  Final      Susceptibility   Methicillin resistant staphylococcus aureus - MIC*    CIPROFLOXACIN >=8 RESISTANT Resistant     ERYTHROMYCIN >=8 RESISTANT Resistant     GENTAMICIN <=0.5 SENSITIVE Sensitive     OXACILLIN >=4 RESISTANT Resistant     TETRACYCLINE <=1 SENSITIVE Sensitive     VANCOMYCIN <=0.5 SENSITIVE Sensitive     TRIMETH/SULFA <=10 SENSITIVE Sensitive     CLINDAMYCIN <=0.25 RESISTANT Resistant     RIFAMPIN <=0.5 SENSITIVE Sensitive     Inducible Clindamycin POSITIVE Resistant     * METHICILLIN RESISTANT STAPHYLOCOCCUS AUREUS  Urine culture     Status: Abnormal   Collection Time: 08/02/16  8:15 PM  Result Value Ref Range Status   Specimen Description URINE, CATHETERIZED  Final   Special Requests NONE  Final   Culture (A)  Final    >=100,000 COLONIES/mL STAPHYLOCOCCUS SPECIES (COAGULASE NEGATIVE)   Report Status 08/05/2016 FINAL  Final   Organism ID, Bacteria STAPHYLOCOCCUS SPECIES (COAGULASE NEGATIVE) (A)  Final      Susceptibility   Staphylococcus species (coagulase negative) - MIC*    CIPROFLOXACIN >=8 RESISTANT Resistant     GENTAMICIN <=0.5 SENSITIVE Sensitive     NITROFURANTOIN <=16 SENSITIVE Sensitive     OXACILLIN >=4 RESISTANT Resistant     TETRACYCLINE <=1 SENSITIVE Sensitive     VANCOMYCIN <=0.5 SENSITIVE Sensitive     TRIMETH/SULFA <=10 SENSITIVE  Sensitive     CLINDAMYCIN <=0.25 SENSITIVE Sensitive     RIFAMPIN <=0.5 SENSITIVE Sensitive     Inducible Clindamycin NEGATIVE Sensitive     * >=100,000 COLONIES/mL STAPHYLOCOCCUS SPECIES (COAGULASE NEGATIVE)  Blood Culture ID Panel (Reflexed)     Status: Abnormal   Collection Time: 08/02/16  8:15 PM  Result Value Ref Range Status   Enterococcus species NOT DETECTED NOT DETECTED Final   Listeria monocytogenes NOT DETECTED NOT DETECTED Final   Staphylococcus species DETECTED (A)  NOT DETECTED Final    Comment: CRITICAL RESULT CALLED TO, READ BACK BY AND VERIFIED WITH: M. MACCIA, PHARM AT 114[address] ON [address] BY S. YARBROUGH    Staphylococcus aureus DETECTED (A) NOT DETECTED Final    Comment: CRITICAL RESULT CALLED TO, READ BACK BY AND VERIFIED WITH: M. MACCIA, PHARM AT 1140 ON [address] BY S. YARBROUGH    Methicillin resistance DETECTED (A) NOT DETECTED Final    Comment: CRITICAL RESULT CALLED TO, READ BACK BY AND VERIFIED WITH: M. MACCIA, PHARM AT 1140 ON [address] BY S. YARBROUGH    Streptococcus species NOT DETECTED NOT DETECTED Final   Streptococcus agalactiae NOT DETECTED NOT DETECTED Final   Streptococcus pneumoniae NOT DETECTED NOT DETECTED Final   Streptococcus pyogenes NOT DETECTED NOT DETECTED Final   Acinetobacter baumannii NOT DETECTED NOT DETECTED Final   Enterobacteriaceae species NOT DETECTED NOT DETECTED Final   Enterobacter cloacae complex NOT DETECTED NOT DETECTED Final   Escherichia coli NOT DETECTED NOT DETECTED Final   Klebsiella oxytoca NOT DETECTED NOT DETECTED Final   Klebsiella pneumoniae NOT DETECTED NOT DETECTED Final   Proteus species NOT DETECTED NOT DETECTED Final   Serratia marcescens NOT DETECTED NOT DETECTED Final   Haemophilus influenzae NOT DETECTED NOT DETECTED Final   Neisseria meningitidis NOT DETECTED NOT DETECTED Final   Pseudomonas aeruginosa NOT DETECTED NOT DETECTED Final   Candida albicans NOT DETECTED NOT DETECTED Final   Candida glabrata NOT  DETECTED NOT DETECTED Final   Candida krusei NOT DETECTED NOT DETECTED Final   Candida parapsilosis NOT DETECTED NOT DETECTED Final   Candida tropicalis NOT DETECTED NOT DETECTED Final  Blood Culture (routine x 2)     Status: Abnormal   Collection Time: 08/02/16  8:35 PM  Result Value Ref Range Status   Specimen Description BLOOD LEFT FOREARM  Final   Special Requests IN PEDIATRIC BOTTLE 1CC  Final   Culture  Setup Time   Final    GRAM POSITIVE COCCI IN CLUSTERS IN PEDIATRIC BOTTLE CRITICAL VALUE NOTED.  VALUE IS CONSISTENT WITH PREVIOUSLY REPORTED AND CALLED VALUE.    Culture (A)  Final    STAPHYLOCOCCUS AUREUS SUSCEPTIBILITIES PERFORMED ON PREVIOUS CULTURE WITHIN THE LAST 5 DAYS.    Report Status 08/05/2016 FINAL  Final  MRSA PCR Screening     Status: Abnormal   Collection Time: 08/03/16  3:40 PM  Result Value Ref Range Status   MRSA by PCR POSITIVE (A) NEGATIVE Final    Comment:        The GeneXpert MRSA Assay (FDA approved for NASAL specimens only), is one component of a comprehensive MRSA colonization surveillance program. It is not intended to diagnose MRSA infection nor to guide or monitor treatment for MRSA infections. RESULT CALLED TO, READ BACK BY AND VERIFIED WITH: Vladimir Creeks RN [id number] 08/03/16 A BROWNING   Aerobic/Anaerobic Culture (surgical/deep wound)     Status: None (Preliminary result)   Collection Time: 08/03/16  5:18 PM  Result Value Ref Range Status   Specimen Description WOUND  Final   Special Requests LUMBAR  Final   Gram Stain   Final    RARE WBC PRESENT,BOTH PMN AND MONONUCLEAR NO ORGANISMS SEEN    Culture FEW METHICILLIN RESISTANT STAPHYLOCOCCUS AUREUS  Final   Report Status PENDING  Incomplete   Organism ID, Bacteria METHICILLIN RESISTANT STAPHYLOCOCCUS AUREUS  Final      Susceptibility   Methicillin resistant staphylococcus aureus - MIC*    CIPROFLOXACIN >=8 RESISTANT Resistant     ERYTHROMYCIN >=  8 RESISTANT Resistant     GENTAMICIN <=0.5  SENSITIVE Sensitive     OXACILLIN >=4 RESISTANT Resistant     TETRACYCLINE <=1 SENSITIVE Sensitive     VANCOMYCIN <=0.5 SENSITIVE Sensitive     TRIMETH/SULFA <=10 SENSITIVE Sensitive     CLINDAMYCIN <=0.25 RESISTANT Resistant     RIFAMPIN <=0.5 SENSITIVE Sensitive     Inducible Clindamycin POSITIVE Resistant     * FEW METHICILLIN RESISTANT STAPHYLOCOCCUS AUREUS  Culture, blood (routine x 2)     Status: None (Preliminary result)   Collection Time: 08/05/16  4:27 AM  Result Value Ref Range Status   Specimen Description BLOOD RIGHT HAND  Final   Special Requests IN PEDIATRIC BOTTLE 3ML  Final   Culture NO GROWTH 1 DAY  Final   Report Status PENDING  Incomplete  Culture, blood (routine x 2)     Status: None (Preliminary result)   Collection Time: 08/05/16  4:40 AM  Result Value Ref Range Status   Specimen Description BLOOD LEFT HAND  Final   Special Requests AEROBIC BOTTLE ONLY 5ML  Final   Culture NO GROWTH 1 DAY  Final   Report Status PENDING  Incomplete      Radiology Studies: No results found.   Medications:  Scheduled: . Chlorhexidine Gluconate Cloth  6 each Topical Q0600  . DAPTOmycin (CUBICIN)  IV  1,000 mg Intravenous Q24H  . enoxaparin (LOVENOX) injection  60 mg Subcutaneous Q24H  . magnesium oxide  400 mg Oral BID  . mouth rinse  15 mL Mouth Rinse BID  . methocarbamol  500 mg Oral Q6H  . multivitamin with minerals  1 tablet Oral Daily  . mupirocin ointment  1 application Nasal BID  . oxyCODONE  10 mg Oral Q8H  . pantoprazole  40 mg Oral Daily  . polyethylene glycol  17 g Oral Daily  . senna  1 tablet Oral BID  . sodium chloride flush  3 mL Intravenous Q12H  . traZODone  50 mg Oral QHS   Continuous:  [id number] **OR** acetaminophen, HYDROmorphone, LORazepam, menthol-cetylpyridinium **OR** phenol, ondansetron (ZOFRAN) IV, oxyCODONE-acetaminophen, polyvinyl alcohol, sodium chloride flush  Assessment/Plan:  Principal Problem:   Sepsis ([place]) Active  Problems:   Hypertension   Normocytic normochromic anemia   ARF (acute renal failure) ([place])   Wound infection    Sepsis With MRSA bacteremia secondary to operative wound infection Patient's MRI did show a focal fluid collection at her operative site in the back. Patient was seen by neurosurgery and was taken to the OR for irrigation and debridement on 11/2. Blood cultures growing MRSA. Cultures taken from the wound during surgery, also growing MRSA. Infectious disease is consulting. Patient currently on daptomycin. Patient apparently had some kind of allergic reaction to vancomycin. Zosyn was discontinued. Overall, patient is hemodynamically stable. Blood cultures have been repeated 11/4. If remains negative, PICC line to be placed on Monday for long-term IV antibiotics. Patient did have elevated lactic acid level which has improved. Pro-calcitonin level also noted to be elevated.   Recent lumbar surgery with wound infection  See above as well. Neurosurgery is following. Wound care is as per them. RN instructed to give her oral pain medicines. She is on scheduled oxycodone as well. She is also on scheduled Robaxin. Have explained to the patient that this will take a while for improvement. She is on a bowel regimen.  Acute renal failure. Resolved. Secondary to hypotension and dehydration. Meloxicam was discontinued. With hydration renal function has improved.  Monitor urine output.  Normocytic anemia. Hemoglobin has trended down slowly without any evidence for overt bleeding. I believe this is dilutional. Transfuse as needed. Continue to monitor for now. Check anemia panel. FOBT.  Mildly elevated troponin. Possibly due to demand ischemia. Patient denies any chest pain. EKG without any ischemic changes. Echocardiogram is still pending.  History of essential hypertension. Blood pressure medications on hold as the patient was hypotensive. Blood pressures have improved, though remained borderline  low. Continue to hold her medications for now.  Questionable Allergic reaction to vancomycin Patient developed a skin rash at the site of infusion. Vancomycin was stopped.  Morbid obesity Body mass index is 54.35 kg/m.   DVT Prophylaxis: SCDs    Code Status: Full code  Family Communication: Discussed with the patient  Disposition Plan: Management as outlined above. Anticipate PICC line placement tomorrow. Anticipate discharge to skilled nursing facility on Tuesday. Echocardiogram is pending.    LOS: 4 days   [facility] Pager [phone] 08/06/2016, 11:23 AM  If 7PM-7AM, please contact night-coverage at [email or web address], password [facility]

## 2016-08-07 ENCOUNTER — Inpatient Hospital Stay (HOSPITAL_COMMUNITY): Payer: Medicare Other

## 2016-08-07 DIAGNOSIS — Z95828 Presence of other vascular implants and grafts: Secondary | ICD-10-CM

## 2016-08-07 DIAGNOSIS — R7881 Bacteremia: Secondary | ICD-10-CM

## 2016-08-07 DIAGNOSIS — B9562 Methicillin resistant Staphylococcus aureus infection as the cause of diseases classified elsewhere: Secondary | ICD-10-CM

## 2016-08-07 DIAGNOSIS — L089 Local infection of the skin and subcutaneous tissue, unspecified: Secondary | ICD-10-CM

## 2016-08-07 DIAGNOSIS — S31000A Unspecified open wound of lower back and pelvis without penetration into retroperitoneum, initial encounter: Secondary | ICD-10-CM

## 2016-08-07 LAB — ECHOCARDIOGRAM COMPLETE
AO mean calculated velocity dopler: 141 cm/s
AV Peak grad: 19 mmHg
AVG: 9 mmHg
AVPKVEL: 218 cm/s
E decel time: 289 msec
EERAT: 7.1
FS: 41 % (ref 28–44)
Height: 62 in
IVS/LV PW RATIO, ED: 1.18
LA diam end sys: 43 mm
LA diam index: 1.9 cm/m2
LA vol index: 35.9 mL/m2
LASIZE: 43 mm
LAVOL: 81.2 mL
LAVOLA4C: 76.8 mL
LDCA: 3.46 cm2
LV E/e' medial: 7.1
LV e' LATERAL: 12.6 cm/s
LVEEAVG: 7.1
LVOT diameter: 21 mm
MV Dec: 289
MVPG: 3 mmHg
MVPKAVEL: 117 m/s
MVPKEVEL: 89.4 m/s
PW: 9.06 mm — AB (ref 0.6–1.1)
RV TAPSE: 32.1 mm
TDI e' lateral: 12.6
TDI e' medial: 9.25
VTI: 41.7 cm
Weight: 4754.88 oz

## 2016-08-07 LAB — FOLATE: Folate: 27.1 ng/mL (ref >5.9)

## 2016-08-07 LAB — VITAMIN B12: VITAMIN B 12: 1129 pg/mL — AB (ref 180–914)

## 2016-08-07 LAB — CBC
HEMATOCRIT: 27.8 % — AB (ref 36.0–46.0)
Hemoglobin: 8.5 g/dL — ABNORMAL LOW (ref 12.0–15.0)
MCH: 28.2 pg (ref 26.0–34.0)
MCHC: 30.6 g/dL (ref 30.0–36.0)
MCV: 92.4 fL (ref 78.0–100.0)
PLATELETS: 239 10*3/uL (ref 150–400)
RBC: 3.01 MIL/uL — AB (ref 3.87–5.11)
RDW: 14.3 % (ref 11.5–15.5)
WBC: 15.6 10*3/uL — AB (ref 4.0–10.5)

## 2016-08-07 LAB — RETICULOCYTES
RBC.: 3.01 MIL/uL — ABNORMAL LOW (ref 3.87–5.11)
RETIC COUNT ABSOLUTE: 18.1 10*3/uL — AB (ref 19.0–186.0)
Retic Ct Pct: 0.6 % (ref 0.4–3.1)

## 2016-08-07 LAB — IRON AND TIBC
IRON: 13 ug/dL — AB (ref 28–170)
Saturation Ratios: 7 % — ABNORMAL LOW (ref 10.4–31.8)
TIBC: 188 ug/dL — AB (ref 250–450)
UIBC: 175 ug/dL

## 2016-08-07 LAB — BASIC METABOLIC PANEL
ANION GAP: 7 (ref 5–15)
BUN: 8 mg/dL (ref 6–20)
CO2: 32 mmol/L (ref 22–32)
Calcium: 8.5 mg/dL — ABNORMAL LOW (ref 8.9–10.3)
Chloride: 100 mmol/L — ABNORMAL LOW (ref 101–111)
Creatinine, Ser: 0.66 mg/dL (ref 0.44–1.00)
GLUCOSE: 124 mg/dL — AB (ref 65–99)
POTASSIUM: 3.6 mmol/L (ref 3.5–5.1)
Sodium: 139 mmol/L (ref 135–145)

## 2016-08-07 LAB — FERRITIN: Ferritin: 218 ng/mL (ref 11–307)

## 2016-08-07 MED ORDER — POLYETHYLENE GLYCOL 3350 17 G PO PACK
17.0000 g | PACK | Freq: Two times a day (BID) | ORAL | Status: DC
  Administered 2016-08-07 – 2016-08-08 (×2): 17 g via ORAL
  Filled 2016-08-07 (×2): qty 1

## 2016-08-07 MED ORDER — SODIUM CHLORIDE 0.9% FLUSH
10.0000 mL | INTRAVENOUS | Status: DC | PRN
  Administered 2016-08-07 – 2016-08-08 (×2): 10 mL
  Filled 2016-08-07 (×2): qty 40

## 2016-08-07 NOTE — Consult Note (Signed)
   [facility] Inpatient Consult   08/07/2016  TERELL LUBRANO 1945-11-10 [id number]     Patient screened for potential [facility] Care Management services. Chart reviewed. Noted current discharge plan is for SNF.  There are no identifiable [facility] Care Management needs at this time. If patient's post hospital needs change, please place a [facility] Care Management consult. For questions please contact:  Marthenia Rolling, [place], RN,BSN [facility] Liaison [phone]

## 2016-08-07 NOTE — Progress Notes (Signed)
[facility] for Infectious Disease   Reason for visit: Follow up on MRSA bacteremia  Interval History: no new issues, TTE without vegetation, Picc line placed  Physical Exam: Constitutional:  Vitals:   08/06/16 2136 08/07/16 0451  BP: (!) 124/56 (!) 145/52  Pulse: 77 79  Resp: 18 20  Temp: 98.6 F (37 C) 99.6 F (37.6 C)   patient appears in nad Respiratory: Normal respiratory effort; CTA B Cardiovascular: RRR  Review of Systems: Constitutional: negative for anorexia Gastrointestinal: negative for diarrhea Integument/breast: negative for rash  Lab Results  Component Value Date   WBC 15.6 (H) 08/07/2016   HGB 8.5 (L) 08/07/2016   HCT 27.8 (L) 08/07/2016   MCV 92.4 08/07/2016   PLT 239 08/07/2016    Lab Results  Component Value Date   CREATININE 0.66 08/07/2016   BUN 8 08/07/2016   NA 139 08/07/2016   K 3.6 08/07/2016   CL 100 (L) 08/07/2016   CO2 32 08/07/2016    Lab Results  Component Value Date   ALT 15 08/03/2016   AST 30 08/03/2016   ALKPHOS 73 08/03/2016     Microbiology: Recent Results (from the past 240 hour(s))  Blood Culture (routine x 2)     Status: Abnormal   Collection Time: 08/02/16  8:15 PM  Result Value Ref Range Status   Specimen Description BLOOD LEFT FOREARM  Final   Special Requests BOTTLES DRAWN AEROBIC AND ANAEROBIC 5CC  Final   Culture  Setup Time   Final    GRAM POSITIVE COCCI IN CLUSTERS IN BOTH AEROBIC AND ANAEROBIC BOTTLES CRITICAL RESULT CALLED TO, READ BACK BY AND VERIFIED WITH: Hughie Closs, PHARM AT [place] ON [address] BY M. WILSON    Culture METHICILLIN RESISTANT STAPHYLOCOCCUS AUREUS (A)  Final   Report Status 08/05/2016 FINAL  Final   Organism ID, Bacteria METHICILLIN RESISTANT STAPHYLOCOCCUS AUREUS  Final      Susceptibility   Methicillin resistant staphylococcus aureus - MIC*    CIPROFLOXACIN >=8 RESISTANT Resistant     ERYTHROMYCIN >=8 RESISTANT Resistant     GENTAMICIN <=0.5 SENSITIVE Sensitive     OXACILLIN >=4  RESISTANT Resistant     TETRACYCLINE <=1 SENSITIVE Sensitive     VANCOMYCIN <=0.5 SENSITIVE Sensitive     TRIMETH/SULFA <=10 SENSITIVE Sensitive     CLINDAMYCIN <=0.25 RESISTANT Resistant     RIFAMPIN <=0.5 SENSITIVE Sensitive     Inducible Clindamycin POSITIVE Resistant     * METHICILLIN RESISTANT STAPHYLOCOCCUS AUREUS  Urine culture     Status: Abnormal   Collection Time: 08/02/16  8:15 PM  Result Value Ref Range Status   Specimen Description URINE, CATHETERIZED  Final   Special Requests NONE  Final   Culture (A)  Final    >=100,000 COLONIES/mL STAPHYLOCOCCUS SPECIES (COAGULASE NEGATIVE)   Report Status 08/05/2016 FINAL  Final   Organism ID, Bacteria STAPHYLOCOCCUS SPECIES (COAGULASE NEGATIVE) (A)  Final      Susceptibility   Staphylococcus species (coagulase negative) - MIC*    CIPROFLOXACIN >=8 RESISTANT Resistant     GENTAMICIN <=0.5 SENSITIVE Sensitive     NITROFURANTOIN <=16 SENSITIVE Sensitive     OXACILLIN >=4 RESISTANT Resistant     TETRACYCLINE <=1 SENSITIVE Sensitive     VANCOMYCIN <=0.5 SENSITIVE Sensitive     TRIMETH/SULFA <=10 SENSITIVE Sensitive     CLINDAMYCIN <=0.25 SENSITIVE Sensitive     RIFAMPIN <=0.5 SENSITIVE Sensitive     Inducible Clindamycin NEGATIVE Sensitive     * >=  100,000 COLONIES/mL STAPHYLOCOCCUS SPECIES (COAGULASE NEGATIVE)  Blood Culture ID Panel (Reflexed)     Status: Abnormal   Collection Time: 08/02/16  8:15 PM  Result Value Ref Range Status   Enterococcus species NOT DETECTED NOT DETECTED Final   Listeria monocytogenes NOT DETECTED NOT DETECTED Final   Staphylococcus species DETECTED (A) NOT DETECTED Final    Comment: CRITICAL RESULT CALLED TO, READ BACK BY AND VERIFIED WITH: M. MACCIA, PHARM AT 114[address] ON [address] BY S. YARBROUGH    Staphylococcus aureus DETECTED (A) NOT DETECTED Final    Comment: CRITICAL RESULT CALLED TO, READ BACK BY AND VERIFIED WITH: [facility], PHARM AT 1140 ON [address] BY S. YARBROUGH    Methicillin resistance DETECTED  (A) NOT DETECTED Final    Comment: CRITICAL RESULT CALLED TO, READ BACK BY AND VERIFIED WITH: M. MACCIA, PHARM AT 1140 ON [address] BY S. YARBROUGH    Streptococcus species NOT DETECTED NOT DETECTED Final   Streptococcus agalactiae NOT DETECTED NOT DETECTED Final   Streptococcus pneumoniae NOT DETECTED NOT DETECTED Final   Streptococcus pyogenes NOT DETECTED NOT DETECTED Final   Acinetobacter baumannii NOT DETECTED NOT DETECTED Final   Enterobacteriaceae species NOT DETECTED NOT DETECTED Final   Enterobacter cloacae complex NOT DETECTED NOT DETECTED Final   Escherichia coli NOT DETECTED NOT DETECTED Final   Klebsiella oxytoca NOT DETECTED NOT DETECTED Final   Klebsiella pneumoniae NOT DETECTED NOT DETECTED Final   Proteus species NOT DETECTED NOT DETECTED Final   Serratia marcescens NOT DETECTED NOT DETECTED Final   Haemophilus influenzae NOT DETECTED NOT DETECTED Final   Neisseria meningitidis NOT DETECTED NOT DETECTED Final   Pseudomonas aeruginosa NOT DETECTED NOT DETECTED Final   Candida albicans NOT DETECTED NOT DETECTED Final   Candida glabrata NOT DETECTED NOT DETECTED Final   Candida krusei NOT DETECTED NOT DETECTED Final   Candida parapsilosis NOT DETECTED NOT DETECTED Final   Candida tropicalis NOT DETECTED NOT DETECTED Final  Blood Culture (routine x 2)     Status: Abnormal   Collection Time: 08/02/16  8:35 PM  Result Value Ref Range Status   Specimen Description BLOOD LEFT FOREARM  Final   Special Requests IN PEDIATRIC BOTTLE 1CC  Final   Culture  Setup Time   Final    GRAM POSITIVE COCCI IN CLUSTERS IN PEDIATRIC BOTTLE CRITICAL VALUE NOTED.  VALUE IS CONSISTENT WITH PREVIOUSLY REPORTED AND CALLED VALUE.    Culture (A)  Final    STAPHYLOCOCCUS AUREUS SUSCEPTIBILITIES PERFORMED ON PREVIOUS CULTURE WITHIN THE LAST 5 DAYS.    Report Status 08/05/2016 FINAL  Final  MRSA PCR Screening     Status: Abnormal   Collection Time: 08/03/16  3:40 PM  Result Value Ref Range Status    MRSA by PCR POSITIVE (A) NEGATIVE Final    Comment:        The GeneXpert MRSA Assay (FDA approved for NASAL specimens only), is one component of a comprehensive MRSA colonization surveillance program. It is not intended to diagnose MRSA infection nor to guide or monitor treatment for MRSA infections. RESULT CALLED TO, READ BACK BY AND VERIFIED WITH: Vladimir Creeks RN [id number] 08/03/16 A BROWNING   Aerobic/Anaerobic Culture (surgical/deep wound)     Status: None (Preliminary result)   Collection Time: 08/03/16  5:18 PM  Result Value Ref Range Status   Specimen Description WOUND  Final   Special Requests LUMBAR  Final   Gram Stain   Final    RARE WBC PRESENT,BOTH PMN AND MONONUCLEAR NO  ORGANISMS SEEN    Culture   Final    FEW METHICILLIN RESISTANT STAPHYLOCOCCUS AUREUS HOLDING FOR POSSIBLE ANAEROBE    Report Status PENDING  Incomplete   Organism ID, Bacteria METHICILLIN RESISTANT STAPHYLOCOCCUS AUREUS  Final      Susceptibility   Methicillin resistant staphylococcus aureus - MIC*    CIPROFLOXACIN >=8 RESISTANT Resistant     ERYTHROMYCIN >=8 RESISTANT Resistant     GENTAMICIN <=0.5 SENSITIVE Sensitive     OXACILLIN >=4 RESISTANT Resistant     TETRACYCLINE <=1 SENSITIVE Sensitive     VANCOMYCIN <=0.5 SENSITIVE Sensitive     TRIMETH/SULFA <=10 SENSITIVE Sensitive     CLINDAMYCIN <=0.25 RESISTANT Resistant     RIFAMPIN <=0.5 SENSITIVE Sensitive     Inducible Clindamycin POSITIVE Resistant     * FEW METHICILLIN RESISTANT STAPHYLOCOCCUS AUREUS  Culture, blood (routine x 2)     Status: None (Preliminary result)   Collection Time: 08/05/16  4:27 AM  Result Value Ref Range Status   Specimen Description BLOOD RIGHT HAND  Final   Special Requests IN PEDIATRIC BOTTLE 3ML  Final   Culture NO GROWTH 2 DAYS  Final   Report Status PENDING  Incomplete  Culture, blood (routine x 2)     Status: None (Preliminary result)   Collection Time: 08/05/16  4:40 AM  Result Value Ref Range Status    Specimen Description BLOOD LEFT HAND  Final   Special Requests AEROBIC BOTTLE ONLY 5ML  Final   Culture NO GROWTH 2 DAYS  Final   Report Status PENDING  Incomplete    Impression/Plan:  1. Post op wound infection - repeat cultures ngtd. picc placed Having trouble with placement on daptomycin I think it is reasonable to re challenge with vancomycin to see if she develops a rash, otherwise  Treatment through December 15th   2. Medication management - cpk down to 90 and stable on daptomycin  We will arrange follow up in our office in 3-4 weeks

## 2016-08-07 NOTE — Progress Notes (Signed)
RN informed CSW that pt son, Shanon Brow, very concerned about possibility of going to [facility]- states pts mom was there recently and died in that facility- believes this would be traumatic to the patient to go there.  Pt son asked CSW to expand referral search to all surrounding counties- referral sent.  CSW will continue to follow  Jorge Ny, LCSW Clinical Social Worker [phone]

## 2016-08-07 NOTE — Clinical Social Work Note (Signed)
Clinical Social Work Assessment  Patient Details  Name: Tracy Espinoza MRN: [id number] Date of Birth: [birth year]  Date of referral:  08/07/16               Reason for consult:  Facility Placement                Permission sought to share information with:  Facility Sport and exercise psychologist, Family Supports Permission granted to share information::  Yes, Verbal Permission Granted  Name::     Dickie La  Agency::  [facility]  Relationship::  Daughter  Contact Information:  [phone]  Housing/Transportation Living arrangements for the past 2 months:  Single Family Home Source of Information:  Patient Patient Interpreter Needed:  None Criminal Activity/Legal Involvement Pertinent to Current Situation/Hospitalization:  No - Comment as needed Significant Relationships:  Adult Children, Other Family Members Lives with:  Self Do you feel safe going back to the place where you live?  Yes Need for family participation in patient care:  No (Coment)  Care giving concerns:  CSW received consult for possible SNF placement at time of discharge. CSW met with patient regarding PT recommendation of SNF placement at time of discharge. Patient states she lives alone and is currently unable to care for herself given patient's current physical needs and fall risk. Patient expressed understanding of PT recommendation and is agreeable to SNF placement at time of discharge. CSW to continue to follow and assist with discharge planning needs.   Social Worker assessment / plan:  CSW spoke with patient concerning possibility of rehab at [facility] before returning home.  Employment status:  Retired Health visitor, Managed Care PT Recommendations:  [place] / Referral to community resources:  [place]  Patient/Family's Response to care:  Patient recognizes need for rehab before returning home and is agreeable to a SNF in [place]. Patient reported  preference for [facility] since she has been there before.   Patient/Family's Understanding of and Emotional Response to Diagnosis, Current Treatment, and Prognosis:  Patient/family is realistic regarding therapy needs and expressed being hopeful for SNF placement. Patient expressed understanding of CSW role and discharge process. No questions/concerns about plan or treatment.    Emotional Assessment Appearance:  Appears younger than stated age Attitude/Demeanor/Rapport:  Other (Appropriate) Affect (typically observed):  Accepting, Appropriate, Pleasant Orientation:  Oriented to Self, Oriented to Place, Oriented to  Time, Oriented to Situation Alcohol / Substance use:  Not Applicable Psych involvement (Current and /or in the community):  No (Comment)  Discharge Needs  Concerns to be addressed:  Care Coordination Readmission within the last 30 days:  Yes Current discharge risk:  None Barriers to Discharge:  Continued Medical Work up   [employer], [place] 08/07/2016, 1:39 PM

## 2016-08-07 NOTE — Progress Notes (Signed)
Patient ID: Tracy Espinoza, female   DOB: 1946-01-08, 70 y.o.   MRN: [id number] Dressing changed today. Steristrips  Removed as most of them were falling off spontaneously. Patient may shower. stable

## 2016-08-07 NOTE — Progress Notes (Signed)
SNFs cannot take pt on Cubicin because it is too expensive ($10000). CSW updated MD to see if we can switch to something else.  Tracy Espinoza LCSWA [phone]

## 2016-08-07 NOTE — NC FL2 (Signed)
[place] LEVEL OF CARE SCREENING TOOL     IDENTIFICATION  Patient Name: Tracy Espinoza Birthdate: 1946/10/02 Sex: female Admission Date (Current Location): 08/02/2016  [facility] and [place] Number:  Engineering geologist and Address:  [facility] [place] [address] [place], [place] [address]      Provider Number: [id number]  Attending Physician Name and Address:  Bonnielee Haff, MD  Relative Name and Phone Number:  Dickie La Daughter - [phone]     Current Level of Care: Hospital Recommended Level of Care: [place] Prior Approval Number:    Date Approved/Denied:   PASRR Number: [id number]  Discharge Plan: SNF    Current Diagnoses: Patient Active Problem List   Diagnosis Date Noted  . Normocytic normochromic anemia 08/03/2016  . ARF (acute renal failure) ([place]) 08/03/2016  . Wound infection 08/03/2016  . Sepsis ([place]) 08/02/2016  . Spondylolisthesis at L4-L5 level 07/10/2016  . Spinal stenosis of lumbar region with radiculopathy 04/27/2016  . Anxiety 04/27/2016  . Trochanteric bursitis of right hip 10/08/2015  . Urinary tract infection 08/31/2015  . Hypertension 03/29/2015  . Hypercholesteremia 03/29/2015  . BMI 45.0-49.9, adult ([place]) 03/29/2015  . Arthralgia of hands, bilateral 03/29/2015    Orientation RESPIRATION BLADDER Height & Weight     Self, Time, Situation, Place  O2 (Nasal cannula 3L) Continent Weight: 134.8 kg (297 lb 2.9 oz) Height:  5\' 2"  (157.5 cm)  BEHAVIORAL SYMPTOMS/MOOD NEUROLOGICAL BOWEL NUTRITION STATUS      Continent Diet (Please see DC Summary)  AMBULATORY STATUS COMMUNICATION OF NEEDS Skin   Extensive Assist Verbally Surgical wounds (Closed incision on back)                       Personal Care Assistance Level of Assistance  Bathing, Feeding, Dressing Bathing Assistance: Maximum assistance Feeding assistance: Independent Dressing Assistance: Maximum assistance      Functional Limitations Info  Sight, Hearing, Speech Sight Info: Adequate Hearing Info: Adequate Speech Info: Adequate    SPECIAL CARE FACTORS FREQUENCY  PT (By licensed PT)     PT Frequency: 5x/week OT Frequency: 3x/week            Contractures Contractures Info: Not present    Additional Factors Info  Code Status, Allergies, Isolation Precautions Code Status Info: Full Allergies Info: Tape, Band-aid Plus Antibiotic Bacitracin-polymyxin B, Iodinated Diagnostic Agents, Morphine Sulfate, Vancomycin     Isolation Precautions Info: MRSA     Current Medications (08/07/2016):  This is the current hospital active medication list Current Facility-Administered Medications  Medication Dose Route Frequency Provider Last Rate Last Dose  . acetaminophen (TYLENOL) tablet 650 mg  650 mg Oral Q4H PRN Eustace Moore, MD   650 mg at 08/05/16 1459   Or  . acetaminophen (TYLENOL) suppository 650 mg  650 mg Rectal Q4H PRN Eustace Moore, MD      . Chlorhexidine Gluconate Cloth 2 % PADS 6 each  6 each Topical Q0600 Bonnielee Haff, MD   6 each at 08/07/16 0600  . DAPTOmycin (CUBICIN) 1,000 mg in sodium chloride 0.9 % IVPB  1,000 mg Intravenous Q24H Thayer Headings, MD   1,000 mg at 08/07/16 1226  . enoxaparin (LOVENOX) injection 60 mg  60 mg Subcutaneous Q24H Melburn Popper, RPH   60 mg at 08/07/16 1227  . HYDROmorphone (DILAUDID) injection 1 mg  1 mg Intravenous Q3H PRN Bonnielee Haff, MD   1 mg at 08/07/16  1108  . LORazepam (ATIVAN) tablet 1 mg  1 mg Oral Daily PRN Rise Patience, MD   1 mg at 08/05/16 0644  . magnesium oxide (MAG-OX) tablet 400 mg  400 mg Oral BID Rise Patience, MD   400 mg at 08/07/16 0915  . MEDLINE mouth rinse  15 mL Mouth Rinse BID Bonnielee Haff, MD   15 mL at 08/07/16 0930  . menthol-cetylpyridinium (CEPACOL) lozenge 3 mg  1 lozenge Oral PRN Eustace Moore, MD       Or  . phenol [facility]) mouth spray 1 spray  1 spray Mouth/Throat PRN Eustace Moore, MD       . methocarbamol (ROBAXIN) tablet 500 mg  500 mg Oral Q6H Rise Patience, MD   500 mg at 08/07/16 1226  . multivitamin with minerals tablet 1 tablet  1 tablet Oral Daily Rise Patience, MD   1 tablet at 08/07/16 0914  . mupirocin ointment (BACTROBAN) 2 % 1 application  1 application Nasal BID Bonnielee Haff, MD   1 application at 0000000 [phone]  . ondansetron (ZOFRAN) injection 4 mg  4 mg Intravenous Q4H PRN Eustace Moore, MD      . oxyCODONE (Oxy IR/ROXICODONE) immediate release tablet 10 mg  10 mg Oral Q8H Bonnielee Haff, MD   10 mg at 08/07/16 0615  . oxyCODONE-acetaminophen (PERCOCET/ROXICET) 5-325 MG per tablet 2 tablet  2 tablet Oral Q4H PRN Rise Patience, MD   2 tablet at 08/07/16 0915  . pantoprazole (PROTONIX) EC tablet 40 mg  40 mg Oral Daily Rise Patience, MD   40 mg at 08/07/16 0914  . polyethylene glycol (MIRALAX / GLYCOLAX) packet 17 g  17 g Oral BID Bonnielee Haff, MD      . polyvinyl alcohol (LIQUIFILM TEARS) 1.4 % ophthalmic solution 1 drop  1 drop Both Eyes Daily PRN Rise Patience, MD      . senna [facility]) tablet 8.6 mg  1 tablet Oral BID Bonnielee Haff, MD   8.6 mg at 08/07/16 0914  . sodium chloride flush (NS) 0.9 % injection 10-40 mL  10-40 mL Intracatheter PRN Bonnielee Haff, MD      . sodium chloride flush (NS) 0.9 % injection 3 mL  3 mL Intravenous Q12H Eustace Moore, MD   3 mL at 08/07/16 0934  . sodium chloride flush (NS) 0.9 % injection 3 mL  3 mL Intravenous PRN Eustace Moore, MD      . traZODone (DESYREL) tablet 50 mg  50 mg Oral QHS Rise Patience, MD   50 mg at 08/06/16 2144     Discharge Medications: Please see discharge summary for a list of discharge medications.  Relevant Imaging Results:  Relevant Lab Results:   Additional Information [id number]  Benard Halsted, LCSWA

## 2016-08-07 NOTE — Care Management Important Message (Signed)
Important Message  Patient Details  Name: Tracy Espinoza MRN: [id number] Date of Birth: 20-Feb-1946   Medicare Important Message Given:  Yes    Carles Collet, RN 08/07/2016, 11:11 AM

## 2016-08-07 NOTE — Progress Notes (Signed)
Peripherally Inserted Central Catheter/Midline Placement  The IV Nurse has discussed with the patient and/or persons authorized to consent for the patient, the purpose of this procedure and the potential benefits and risks involved with this procedure.  The benefits include less needle sticks, lab draws from the catheter, and the patient may be discharged home with the catheter. Risks include, but not limited to, infection, bleeding, blood clot (thrombus formation), and puncture of an artery; nerve damage and irregular heartbeat and possibility to perform a PICC exchange if needed/ordered by physician.  Alternatives to this procedure were also discussed.  B[employer] Power PICC patient education guide, fact sheet on infection prevention and patient information card has been provided to patient /or left at bedside.    PICC/Midline Placement Documentation        Henderson [place] 08/07/2016, 11:25 AM Consent obtained by Marianna Payment, RN

## 2016-08-07 NOTE — Progress Notes (Signed)
PT Cancellation Note  Patient Details Name: Tracy Espinoza MRN: [id number] DOB: Feb 10, 1946   Cancelled Treatment:    Reason Eval/Treat Not Completed: Patient at procedure or test/unavailable. Pt undergoing picc line placement in room. PT to return as able.   Kingsley Callander 08/07/2016, 12:21 PM   Kittie Plater, PT, DPT Pager #: [phone] Office #: [phone]

## 2016-08-07 NOTE — Progress Notes (Addendum)
[facility] PROGRESS NOTE  Tracy Espinoza [id number] DOB: [birth year] DOA: 08/02/2016  PCP: Golden Pop, MD  Brief History/Interval Summary:  70 year old Caucasian female who is morbidly obese, who has a past medical history of hypertension and underwent lumbar decompression surgery on October 9. She was discharged to rehabilitation. Patient did well at rehabilitation and went back home about a week ago. Over the last 2-3 days she has noticed worsening lower back pain with drainage from her incision site, which has been clear for the most part. And then, she's developed fever and chills. She was hospitalized for further management. She underwent irrigation and debridement of the wound in her back. She subsequently was found to have MRSA bacteremia.  Reason for Visit: MRSA bacteremia and wound infection  Consultants: Neurosurgery. Infectious disease.  Procedures:  11/2: Irrigation and debridement of large complex lumbar wound infection with debridement of deep tissues and primary closure  Antibiotics: Linezolid and Zosyn stopped on 11/2 Daptomycin 11/2  Subjective/Interval History: Patient's pain is better controlled today. Last bowel movement was 2 days ago. Denies any nausea, vomiting, although admits to poor appetite.   ROS: Denies any chest pain or shortness of breath.   Objective:  Vital Signs  Vitals:   08/06/16 0347 08/06/16 1835 08/06/16 2136 08/07/16 0451  BP: 130/76 (!) 155/59 (!) 124/56 (!) 145/52  Pulse: 79 75 77 79  Resp: 19 20 18 20   Temp: 98.5 F (36.9 C) 99.5 F (37.5 C) 98.6 F (37 C) 99.6 F (37.6 C)  TempSrc:  Oral Oral Oral  SpO2: 100% 93% 94%   Weight:      Height:        Intake/Output Summary (Last 24 hours) at 08/07/16 0958 Last data filed at 08/07/16 0835  Gross per 24 hour  Intake                3 ml  Output              700 ml  Net             -697 ml   Filed Weights   08/03/16 0628 08/05/16 2116  Weight: 134.9 kg (297 lb 6.4  oz) 134.8 kg (297 lb 2.9 oz)    General appearance: alert, cooperative, appears stated age and no distress. Obese. Resp: Improved air entry at the bases. No definite crackles or wheezing. Normal effort. Cardio: regular rate and rhythm, S1, S2 normal, no murmur, click, rub or gallop GI: soft, non-tender; bowel sounds normal; no masses,  no organomegaly. Back: Has dressing Neurologic: Awake and alert. Oriented 3. Cranial nerves II-12 intact. Motor strength equal bilateral upper extremities. Equal in the lower extremities as well.  Lab Results:  Data Reviewed: I have personally reviewed following labs and imaging studies  CBC:  Recent Labs Lab 07/31/16 1200 08/02/16 2015 08/03/16 0335 08/04/16 0741 08/05/16 0427 08/06/16 0505 08/07/16 0604  WBC 10.9 14.9* 9.4 8.7 8.3 10.8* 15.6*  NEUTROABS 6.8* 11.5* 7.3  --   --   --   --   HGB 11.2* 10.2* 9.7* 8.8* 8.1* 8.2* 8.5*  HCT 33.4* 32.3* 31.3* 28.8* 26.8* 27.3* 27.8*  MCV 89.7 92.3 94.0 94.4 93.7 93.5 92.4  PLT 281 262 195 214 206 246 [id number]    Basic Metabolic Panel:  Recent Labs Lab 08/03/16 0335 08/04/16 0741 08/05/16 0427 08/06/16 0505 08/07/16 0604  NA 135 139 136 138 139  K 3.5 4.0 3.5 3.5 3.6  CL 101 105 105 102  100*  CO2 26 29 28 28  32  GLUCOSE 149* 135* 127* 113* 124*  BUN 28* 19 16 11 8   CREATININE 0.94 0.82 0.69 0.62 0.66  CALCIUM 7.4* 7.9* 8.0* 8.2* 8.5*    GFR: Estimated Creatinine Clearance: 85.5 mL/min (by C-G formula based on SCr of 0.66 mg/dL).  Liver Function Tests:  Recent Labs Lab 08/02/16 2015 08/03/16 0335  AST 31 30  ALT 14 15  ALKPHOS 85 73  BILITOT 1.0 1.0  PROT 6.1* 5.0*  ALBUMIN 2.7* 2.1*    Coagulation Profile:  Recent Labs Lab 08/03/16 0335  INR 1.19    Cardiac Enzymes:  Recent Labs Lab 08/03/16 0721 08/03/16 1230 08/03/16 2019 08/06/16 1356  CKTOTAL  --  491*  --  90  TROPONINI 0.09* 0.05* 0.03*  --      Recent Results (from the past 240 hour(s))  Blood  Culture (routine x 2)     Status: Abnormal   Collection Time: 08/02/16  8:15 PM  Result Value Ref Range Status   Specimen Description BLOOD LEFT FOREARM  Final   Special Requests BOTTLES DRAWN AEROBIC AND ANAEROBIC 5CC  Final   Culture  Setup Time   Final    GRAM POSITIVE COCCI IN CLUSTERS IN BOTH AEROBIC AND ANAEROBIC BOTTLES CRITICAL RESULT CALLED TO, READ BACK BY AND VERIFIED WITH: Hughie Closs, PHARM AT [place] [address] BY M. WILSON    Culture METHICILLIN RESISTANT STAPHYLOCOCCUS AUREUS (A)  Final   Report Status 08/05/2016 FINAL  Final   Organism ID, Bacteria METHICILLIN RESISTANT STAPHYLOCOCCUS AUREUS  Final      Susceptibility   Methicillin resistant staphylococcus aureus - MIC*    CIPROFLOXACIN >=8 RESISTANT Resistant     ERYTHROMYCIN >=8 RESISTANT Resistant     GENTAMICIN <=0.5 SENSITIVE Sensitive     OXACILLIN >=4 RESISTANT Resistant     TETRACYCLINE <=1 SENSITIVE Sensitive     VANCOMYCIN <=0.5 SENSITIVE Sensitive     TRIMETH/SULFA <=10 SENSITIVE Sensitive     CLINDAMYCIN <=0.25 RESISTANT Resistant     RIFAMPIN <=0.5 SENSITIVE Sensitive     Inducible Clindamycin POSITIVE Resistant     * METHICILLIN RESISTANT STAPHYLOCOCCUS AUREUS  Urine culture     Status: Abnormal   Collection Time: 08/02/16  8:15 PM  Result Value Ref Range Status   Specimen Description URINE, CATHETERIZED  Final   Special Requests NONE  Final   Culture (A)  Final    >=100,000 COLONIES/mL STAPHYLOCOCCUS SPECIES (COAGULASE NEGATIVE)   Report Status 08/05/2016 FINAL  Final   Organism ID, Bacteria STAPHYLOCOCCUS SPECIES (COAGULASE NEGATIVE) (A)  Final      Susceptibility   Staphylococcus species (coagulase negative) - MIC*    CIPROFLOXACIN >=8 RESISTANT Resistant     GENTAMICIN <=0.5 SENSITIVE Sensitive     NITROFURANTOIN <=16 SENSITIVE Sensitive     OXACILLIN >=4 RESISTANT Resistant     TETRACYCLINE <=1 SENSITIVE Sensitive     VANCOMYCIN <=0.5 SENSITIVE Sensitive     TRIMETH/SULFA <=10 SENSITIVE  Sensitive     CLINDAMYCIN <=0.25 SENSITIVE Sensitive     RIFAMPIN <=0.5 SENSITIVE Sensitive     Inducible Clindamycin NEGATIVE Sensitive     * >=100,000 COLONIES/mL STAPHYLOCOCCUS SPECIES (COAGULASE NEGATIVE)  Blood Culture ID Panel (Reflexed)     Status: Abnormal   Collection Time: 08/02/16  8:15 PM  Result Value Ref Range Status   Enterococcus species NOT DETECTED NOT DETECTED Final   Listeria monocytogenes NOT DETECTED NOT DETECTED Final   Staphylococcus species DETECTED (  A) NOT DETECTED Final    Comment: CRITICAL RESULT CALLED TO, READ BACK BY AND VERIFIED WITH: M. MACCIA, PHARM AT [address] ON [address] BY S. YARBROUGH    Staphylococcus aureus DETECTED (A) NOT DETECTED Final    Comment: CRITICAL RESULT CALLED TO, READ BACK BY AND VERIFIED WITH: M. MACCIA, PHARM AT [address] ON [address] BY S. YARBROUGH    Methicillin resistance DETECTED (A) NOT DETECTED Final    Comment: CRITICAL RESULT CALLED TO, READ BACK BY AND VERIFIED WITH: M. MACCIA, PHARM AT [address] ON [address] BY S. YARBROUGH    Streptococcus species NOT DETECTED NOT DETECTED Final   Streptococcus agalactiae NOT DETECTED NOT DETECTED Final   Streptococcus pneumoniae NOT DETECTED NOT DETECTED Final   Streptococcus pyogenes NOT DETECTED NOT DETECTED Final   Acinetobacter baumannii NOT DETECTED NOT DETECTED Final   Enterobacteriaceae species NOT DETECTED NOT DETECTED Final   Enterobacter cloacae complex NOT DETECTED NOT DETECTED Final   Escherichia coli NOT DETECTED NOT DETECTED Final   Klebsiella oxytoca NOT DETECTED NOT DETECTED Final   Klebsiella pneumoniae NOT DETECTED NOT DETECTED Final   Proteus species NOT DETECTED NOT DETECTED Final   Serratia marcescens NOT DETECTED NOT DETECTED Final   Haemophilus influenzae NOT DETECTED NOT DETECTED Final   Neisseria meningitidis NOT DETECTED NOT DETECTED Final   Pseudomonas aeruginosa NOT DETECTED NOT DETECTED Final   Candida albicans NOT DETECTED NOT DETECTED Final   Candida glabrata NOT  DETECTED NOT DETECTED Final   Candida krusei NOT DETECTED NOT DETECTED Final   Candida parapsilosis NOT DETECTED NOT DETECTED Final   Candida tropicalis NOT DETECTED NOT DETECTED Final  Blood Culture (routine x 2)     Status: Abnormal   Collection Time: 08/02/16  8:35 PM  Result Value Ref Range Status   Specimen Description BLOOD LEFT FOREARM  Final   Special Requests IN PEDIATRIC BOTTLE 1CC  Final   Culture  Setup Time   Final    GRAM POSITIVE COCCI IN CLUSTERS IN PEDIATRIC BOTTLE CRITICAL VALUE NOTED.  VALUE IS CONSISTENT WITH PREVIOUSLY REPORTED AND CALLED VALUE.    Culture (A)  Final    STAPHYLOCOCCUS AUREUS SUSCEPTIBILITIES PERFORMED ON PREVIOUS CULTURE WITHIN THE LAST 5 DAYS.    Report Status 08/05/2016 FINAL  Final  MRSA PCR Screening     Status: Abnormal   Collection Time: 08/03/16  3:40 PM  Result Value Ref Range Status   MRSA by PCR POSITIVE (A) NEGATIVE Final    Comment:        The GeneXpert MRSA Assay (FDA approved for NASAL specimens only), is one component of a comprehensive MRSA colonization surveillance program. It is not intended to diagnose MRSA infection nor to guide or monitor treatment for MRSA infections. RESULT CALLED TO, READ BACK BY AND VERIFIED WITH: Vladimir Creeks RN [id number] 08/03/16 A BROWNING   Aerobic/Anaerobic Culture (surgical/deep wound)     Status: None (Preliminary result)   Collection Time: 08/03/16  5:18 PM  Result Value Ref Range Status   Specimen Description WOUND  Final   Special Requests LUMBAR  Final   Gram Stain   Final    RARE WBC PRESENT,BOTH PMN AND MONONUCLEAR NO ORGANISMS SEEN    Culture FEW METHICILLIN RESISTANT STAPHYLOCOCCUS AUREUS  Final   Report Status PENDING  Incomplete   Organism ID, Bacteria METHICILLIN RESISTANT STAPHYLOCOCCUS AUREUS  Final      Susceptibility   Methicillin resistant staphylococcus aureus - MIC*    CIPROFLOXACIN >=8 RESISTANT Resistant  ERYTHROMYCIN >=8 RESISTANT Resistant     GENTAMICIN <=0.5  SENSITIVE Sensitive     OXACILLIN >=4 RESISTANT Resistant     TETRACYCLINE <=1 SENSITIVE Sensitive     VANCOMYCIN <=0.5 SENSITIVE Sensitive     TRIMETH/SULFA <=10 SENSITIVE Sensitive     CLINDAMYCIN <=0.25 RESISTANT Resistant     RIFAMPIN <=0.5 SENSITIVE Sensitive     Inducible Clindamycin POSITIVE Resistant     * FEW METHICILLIN RESISTANT STAPHYLOCOCCUS AUREUS  Culture, blood (routine x 2)     Status: None (Preliminary result)   Collection Time: 08/05/16  4:27 AM  Result Value Ref Range Status   Specimen Description BLOOD RIGHT HAND  Final   Special Requests IN PEDIATRIC BOTTLE 3ML  Final   Culture NO GROWTH 1 DAY  Final   Report Status PENDING  Incomplete  Culture, blood (routine x 2)     Status: None (Preliminary result)   Collection Time: 08/05/16  4:40 AM  Result Value Ref Range Status   Specimen Description BLOOD LEFT HAND  Final   Special Requests AEROBIC BOTTLE ONLY 5ML  Final   Culture NO GROWTH 1 DAY  Final   Report Status PENDING  Incomplete      Radiology Studies: No results found.   Medications:  Scheduled: . Chlorhexidine Gluconate Cloth  6 each Topical Q0600  . DAPTOmycin (CUBICIN)  IV  1,000 mg Intravenous Q24H  . enoxaparin (LOVENOX) injection  60 mg Subcutaneous Q24H  . magnesium oxide  400 mg Oral BID  . mouth rinse  15 mL Mouth Rinse BID  . methocarbamol  500 mg Oral Q6H  . multivitamin with minerals  1 tablet Oral Daily  . mupirocin ointment  1 application Nasal BID  . oxyCODONE  10 mg Oral Q8H  . pantoprazole  40 mg Oral Daily  . polyethylene glycol  17 g Oral Daily  . senna  1 tablet Oral BID  . sodium chloride flush  3 mL Intravenous Q12H  . traZODone  50 mg Oral QHS   Continuous:  [id number] **OR** acetaminophen, HYDROmorphone, LORazepam, menthol-cetylpyridinium **OR** phenol, ondansetron (ZOFRAN) IV, oxyCODONE-acetaminophen, polyvinyl alcohol, sodium chloride flush  Assessment/Plan:  Principal Problem:   Sepsis ([place]) Active  Problems:   Hypertension   Normocytic normochromic anemia   ARF (acute renal failure) ([place])   Wound infection    Sepsis With MRSA bacteremia secondary to operative wound infection Patient's MRI did show a focal fluid collection at her operative site in the back. Patient was seen by neurosurgery and was taken to the OR for irrigation and debridement on 11/2. Blood cultures growing MRSA. Cultures taken from the wound during surgery, also growing MRSA. Infectious disease is consulting. Patient currently on daptomycin. Patient apparently had some kind of allergic reaction to vancomycin. Zosyn was discontinued. Overall, patient is hemodynamically stable. Blood cultures have been repeated 11/4. They remain negative so far. Okay to place PICC line. WBC noted to be higher today. She remains afebrile. Continue to watch for now. Patient did have elevated lactic acid level which has improved. Pro-calcitonin level also noted to be elevated. Echocardiogram is pending.  Recent lumbar surgery with wound infection  See above as well. Neurosurgery is following. Wound care is as per them. RN instructed to give her oral pain medicines. She is on scheduled oxycodone as well. She is also on scheduled Robaxin. Have explained to the patient that this will take a while for improvement. She is on a bowel regimen. I'll increase the dose of MiraLAX.  Acute renal failure. Resolved. Secondary to hypotension and dehydration. Meloxicam was discontinued. With hydration renal function has improved. Monitor urine output.  Normocytic anemia. Hemoglobin has trended down slowly without any evidence for overt bleeding. I believe this is dilutional. Transfuse as needed. Continue to monitor for now. Anemia panel reviewed. No obvious deficiencies identified. FOBT.  Mildly elevated troponin. Possibly due to demand ischemia. Patient denies any chest pain. EKG without any ischemic changes. Echocardiogram is done yesterday evening, but  report is pending.  History of essential hypertension. Blood pressure medications on hold as the patient was hypotensive. Blood pressures have improved, though remained borderline low. Continue to hold her medications for now.  Questionable Allergic reaction to vancomycin Patient developed a skin rash at the site of infusion. Vancomycin was stopped.  Morbid obesity Body mass index is 54.35 kg/m.   DVT Prophylaxis: SCDs    Code Status: Full code  Family Communication: Discussed with the patient  Disposition Plan: Management as outlined above. PICC line to be placed today. Anticipate discharge to skilled nursing facility on Tuesday. Echocardiogram report is pending.    LOS: 5 days   [facility] Pager [phone] 08/07/2016, 9:58 AM  If 7PM-7AM, please contact night-coverage at [email or web address], password [facility]

## 2016-08-07 NOTE — Progress Notes (Signed)
Physical Therapy Treatment Patient Details Name: Tracy Espinoza MRN: [id number] DOB: 01/01/1946 Today's Date: 08/07/2016    History of Present Illness pt is a 70 y/o female with h/o HTN, TKA, rot. cuff surgery, recent L45 decompressive lami and PLA, admitted 2 days after d/c home from SNF with sepsis. s/p I&D of back.    PT Comments    Pt extremely upset about d/cing to SNF however agreeable to amb, pt improved ambulation tolerance however remains greatly limited by pain in R hip. Acute PT to con't to follow to progress towards indep with mobility.  Follow Up Recommendations  SNF     Equipment Recommendations  None recommended by PT    Recommendations for Other Services       Precautions / Restrictions Precautions Precautions: Back;Fall Precaution Booklet Issued: Yes (comment) Precaution Comments: reviewed spinal precautions Required Braces or Orthoses: Spinal Brace Spinal Brace: Lumbar corset;Applied in sitting position Restrictions Weight Bearing Restrictions: No Other Position/Activity Restrictions: per MD order, does not require back brace, but pt preferring to use her lumbar corset (donned in sitting)    Mobility  Bed Mobility Overal bed mobility: Needs Assistance;+2 for physical assistance Bed Mobility: Rolling;Sit to Supine Rolling: Mod assist;+2 for physical assistance     Sit to supine: Max assist;+2 for physical assistance   General bed mobility comments: max v/c's to adhere to precautions, assist for trunk management and LE management  Transfers Overall transfer level: Needs assistance Equipment used: Rolling walker (2 wheeled) Transfers: Sit to/from Stand Sit to Stand: Mod assist         General transfer comment: multiple attempts, increased time  Ambulation/Gait Ambulation/Gait assistance: Min guard Ambulation Distance (Feet): 40 Feet Assistive device: Rolling walker (2 wheeled) Gait Pattern/deviations: Step-to pattern;Decreased stride  length;Wide base of support Gait velocity: slow Gait velocity interpretation: Below normal speed for age/gender General Gait Details: extremely slow, 15 min to amb 40'   Stairs            Wheelchair Mobility    Modified Rankin (Stroke Patients Only)       Balance Overall balance assessment: Needs assistance Sitting-balance support: Feet supported Sitting balance-Leahy Scale: Fair     Standing balance support: Bilateral upper extremity supported Standing balance-Leahy Scale: Poor Standing balance comment: pt unable to stand and perform hygiene s/p urination                    Cognition Arousal/Alertness: Awake/alert Behavior During Therapy: WFL for tasks assessed/performed Overall Cognitive Status: Impaired/Different from baseline                      Exercises      General Comments General comments (skin integrity, edema, etc.): pt assisted to [facility], pt unable to perfrom hygiene requiring maxA      Pertinent Vitals/Pain Pain Assessment: 0-10 Pain Score: 8  Pain Location: R hip Pain Descriptors / Indicators: Sharp Pain Intervention(s): Monitored during session    Home Living                      Prior Function            PT Goals (current goals can now be found in the care plan section) Acute Rehab PT Goals Patient Stated Goal: return to independence Progress towards PT goals: Progressing toward goals    Frequency    Min 4X/week      PT Plan Current plan remains appropriate  Co-evaluation             End of Session Equipment Utilized During Treatment: Gait belt;Back brace Activity Tolerance: Patient limited by pain Patient left: with call bell/phone within reach;with family/visitor present;in bed     Time: [id number] PT Time Calculation (min) (ACUTE ONLY): 42 min  Charges:  $Gait Training: 23-37 mins $Therapeutic Activity: 8-22 mins                    G Codes:      Kingsley Callander 08/07/2016,  3:47 PM   Kittie Plater, PT, DPT Pager #: [phone] Office #: [phone]

## 2016-08-08 DIAGNOSIS — A4102 Sepsis due to Methicillin resistant Staphylococcus aureus: Secondary | ICD-10-CM | POA: Diagnosis not present

## 2016-08-08 DIAGNOSIS — M9689 Other intraoperative and postprocedural complications and disorders of the musculoskeletal system: Secondary | ICD-10-CM | POA: Diagnosis not present

## 2016-08-08 DIAGNOSIS — Z741 Need for assistance with personal care: Secondary | ICD-10-CM | POA: Diagnosis not present

## 2016-08-08 DIAGNOSIS — A4902 Methicillin resistant Staphylococcus aureus infection, unspecified site: Secondary | ICD-10-CM | POA: Diagnosis not present

## 2016-08-08 DIAGNOSIS — M479 Spondylosis, unspecified: Secondary | ICD-10-CM | POA: Diagnosis not present

## 2016-08-08 DIAGNOSIS — R262 Difficulty in walking, not elsewhere classified: Secondary | ICD-10-CM | POA: Diagnosis not present

## 2016-08-08 DIAGNOSIS — R0602 Shortness of breath: Secondary | ICD-10-CM | POA: Diagnosis not present

## 2016-08-08 DIAGNOSIS — R05 Cough: Secondary | ICD-10-CM | POA: Diagnosis not present

## 2016-08-08 DIAGNOSIS — B9562 Methicillin resistant Staphylococcus aureus infection as the cause of diseases classified elsewhere: Secondary | ICD-10-CM | POA: Diagnosis not present

## 2016-08-08 DIAGNOSIS — N179 Acute kidney failure, unspecified: Secondary | ICD-10-CM | POA: Diagnosis not present

## 2016-08-08 DIAGNOSIS — Z5181 Encounter for therapeutic drug level monitoring: Secondary | ICD-10-CM | POA: Diagnosis not present

## 2016-08-08 DIAGNOSIS — B999 Unspecified infectious disease: Secondary | ICD-10-CM | POA: Diagnosis not present

## 2016-08-08 DIAGNOSIS — I10 Essential (primary) hypertension: Secondary | ICD-10-CM | POA: Diagnosis not present

## 2016-08-08 DIAGNOSIS — F419 Anxiety disorder, unspecified: Secondary | ICD-10-CM | POA: Diagnosis not present

## 2016-08-08 DIAGNOSIS — M6259 Muscle wasting and atrophy, not elsewhere classified, multiple sites: Secondary | ICD-10-CM | POA: Diagnosis not present

## 2016-08-08 DIAGNOSIS — F418 Other specified anxiety disorders: Secondary | ICD-10-CM | POA: Diagnosis not present

## 2016-08-08 DIAGNOSIS — M549 Dorsalgia, unspecified: Secondary | ICD-10-CM | POA: Diagnosis not present

## 2016-08-08 DIAGNOSIS — D649 Anemia, unspecified: Secondary | ICD-10-CM | POA: Diagnosis not present

## 2016-08-08 DIAGNOSIS — A4101 Sepsis due to Methicillin susceptible Staphylococcus aureus: Secondary | ICD-10-CM | POA: Diagnosis not present

## 2016-08-08 DIAGNOSIS — R0989 Other specified symptoms and signs involving the circulatory and respiratory systems: Secondary | ICD-10-CM | POA: Diagnosis not present

## 2016-08-08 DIAGNOSIS — B9561 Methicillin susceptible Staphylococcus aureus infection as the cause of diseases classified elsewhere: Secondary | ICD-10-CM | POA: Diagnosis not present

## 2016-08-08 LAB — CBC
HEMATOCRIT: 27.3 % — AB (ref 36.0–46.0)
HEMOGLOBIN: 8.1 g/dL — AB (ref 12.0–15.0)
MCH: 27.6 pg (ref 26.0–34.0)
MCHC: 29.7 g/dL — ABNORMAL LOW (ref 30.0–36.0)
MCV: 93.2 fL (ref 78.0–100.0)
Platelets: 221 10*3/uL (ref 150–400)
RBC: 2.93 MIL/uL — ABNORMAL LOW (ref 3.87–5.11)
RDW: 14.4 % (ref 11.5–15.5)
WBC: 14.7 10*3/uL — AB (ref 4.0–10.5)

## 2016-08-08 LAB — BASIC METABOLIC PANEL
ANION GAP: 5 (ref 5–15)
BUN: 9 mg/dL (ref 6–20)
CHLORIDE: 102 mmol/L (ref 101–111)
CO2: 32 mmol/L (ref 22–32)
Calcium: 8.7 mg/dL — ABNORMAL LOW (ref 8.9–10.3)
Creatinine, Ser: 0.68 mg/dL (ref 0.44–1.00)
GFR calc non Af Amer: >60 mL/min (ref >60)
Glucose, Bld: 115 mg/dL — ABNORMAL HIGH (ref 65–99)
POTASSIUM: 3.6 mmol/L (ref 3.5–5.1)
Sodium: 139 mmol/L (ref 135–145)

## 2016-08-08 MED ORDER — HEPARIN SOD (PORK) LOCK FLUSH 100 UNIT/ML IV SOLN
250.0000 [IU] | INTRAVENOUS | Status: AC | PRN
  Administered 2016-08-08: 250 [IU]

## 2016-08-08 MED ORDER — METHOCARBAMOL 500 MG PO TABS: 500.0000 mg | ORAL_TABLET | Freq: Four times a day (QID) | ORAL | 0 refills | Status: DC

## 2016-08-08 MED ORDER — LORAZEPAM 1 MG PO TABS: 1.0000 mg | ORAL_TABLET | Freq: Every day | ORAL | 0 refills | Status: DC | PRN

## 2016-08-08 MED ORDER — OXYCODONE-ACETAMINOPHEN 5-325 MG PO TABS: 2.0000 | ORAL_TABLET | ORAL | 0 refills | Status: DC | PRN

## 2016-08-08 MED ORDER — OXYCODONE HCL 10 MG PO TABS: 10.0000 mg | ORAL_TABLET | Freq: Three times a day (TID) | ORAL | 0 refills | Status: DC

## 2016-08-08 MED ORDER — TRAZODONE HCL 50 MG PO TABS: 50.0000 mg | ORAL_TABLET | Freq: Every day | ORAL | 0 refills | Status: DC

## 2016-08-08 MED ORDER — SENNA 8.6 MG PO TABS: 1.0000 | ORAL_TABLET | Freq: Two times a day (BID) | ORAL | 0 refills | Status: DC

## 2016-08-08 MED ORDER — POLYETHYLENE GLYCOL 3350 17 G PO PACK: 17.0000 g | PACK | Freq: Two times a day (BID) | ORAL | 0 refills | Status: DC

## 2016-08-08 MED ORDER — SODIUM CHLORIDE 0.9 % IV SOLN: 1000.0000 mg | INTRAVENOUS | Status: DC

## 2016-08-08 NOTE — Discharge Summary (Signed)
[facility]  Physician Discharge Summary   Patient ID: DORANN VASSELL MRN: [id number] DOB/AGE: [birth year] 70 y.o.  Admit date: 08/02/2016 Discharge date: 08/08/2016  PCP: Golden Pop, MD  DISCHARGE DIAGNOSES:  Principal Problem:   Sepsis ([place]) Active Problems:   Hypertension   Normocytic normochromic anemia   ARF (acute renal failure) (HCC)   Wound infection   RECOMMENDATIONS FOR OUTPATIENT FOLLOW UP: 1. CK levels to be checked weekly while patient is on daptomycin 2. CBC and basic metabolic panel in 1 week 3. Dressing changes as mentioned below. 4. Sleep study when she is better 5. Oxygen at nighttime at 2lpm 6. Follow-up with Dr. Ellene Route in 2 weeks.  DISCHARGE CONDITION: fair  Diet recommendation: Heart healthy  Filed Weights   08/03/16 0628 08/05/16 2116  Weight: 134.9 kg (297 lb 6.4 oz) 134.8 kg (297 lb 2.9 oz)    INITIAL HISTORY: 70 year old Caucasian female who is morbidly obese, who has a past medical history of hypertension and underwent lumbar decompression surgery on October 9. She was discharged to rehabilitation. Patient did well at rehabilitation and went back home about a week ago. Over the last 2-3 days she has noticed worsening lower back pain with drainage from her incision site, which has been clear for the most part. And then, she's developed fever and chills. She was hospitalized for further management. She underwent irrigation and debridement of the wound in her back. She subsequently was found to have MRSA bacteremia.  Consultants: Neurosurgery. Infectious disease.  Procedures:  11/2: Irrigation and debridement of large complex lumbar wound infection with debridement of deep tissues and primary closure  Transthoracic echocardiogram Study Conclusions  - Left ventricle: The cavity size was normal. Wall thickness was   normal. Systolic function was normal. The estimated ejection   fraction was in the range of 60% to 65%. Wall motion was  normal;   there were no regional wall motion abnormalities. Doppler   parameters are consistent with abnormal left ventricular   relaxation (grade 1 diastolic dysfunction). - Aortic valve: Trileaflet; mildly thickened, mildly calcified   leaflets. Transvalvular velocity was minimally increased. There   was no stenosis. There was trivial regurgitation. - Aorta: The aorta was mildly calcified. - Mitral valve: There was mild regurgitation. - Left atrium: The atrium was mildly dilated. Anterior-posterior   dimension: 43 mm.  HOSPITAL COURSE:   Sepsis With MRSA bacteremia secondary to operative wound infection Patient's MRI did show a focal fluid collection at her operative site in the back. Patient was seen by neurosurgery and was taken to the OR for irrigation and debridement on 11/2. Blood cultures growing MRSA. Cultures taken from the wound during surgery, also growing MRSA. Infectious disease was consulted. Patient was initially placed on vancomycin and Zosyn. Patient had some kind of further local allergic reaction to vancomycin, so this was discontinued and she was placed on linezolid. Once the cultures showed MRSA. She was changed over to daptomycin. Zosyn was discontinued. Repeat cultures were sent and they have been negative as well. PICC line was placed. She will need antibiotics till December 15. Patient did have elevated lactic acid level which has improved. Pro-calcitonin level also noted to be elevated. Echocardiogram is as above. WBC did climb yesterday, but improving today. She has been afebrile.  Recent lumbar surgery with wound infection  See above as well. Neurosurgery has been following the patient. Currently getting wound care twice a day. Pain control. She is on scheduled narcotics as well as needed. Bowel  regimen. She is also on scheduled Robaxin.   Acute renal failure. This was secondary to hypotension and dehydration. Meloxicam was discontinued. With hydration renal  function has improved.   Normocytic anemia. Hemoglobin has trended down slowly without any evidence for overt bleeding. This appears to be dilutional. No evidence for overt bleeding. Monitor CBC at the skilled nursing facility. Anemia panel reviewed. No obvious deficiencies identified. FOBT was ordered but not done here so far. Could be pursued at [facility].  Mildly elevated troponin. Possibly due to demand ischemia. Patient denied any chest pain. EKG without any ischemic changes. Echocardiogram did not show any wall motion abnormalities. Normal Systolic function was noted.   History of essential hypertension. Blood pressure medications were initially placed on hold as the patient was hypotensive. Blood pressures have improved. Okay to resume amlodipine. Continue to hold Cozaar and hydrochlorothiazide.  Questionable Allergic reaction to vancomycin Patient developed a skin rash at the site of infusion. Vancomycin was stopped.  Morbid obesity Body mass index is 54.35 kg/m.  Overall, stable. Patient is medically stable for discharge. She will be discharged to skilled nursing facility.   PERTINENT LABS:  The results of significant diagnostics from this hospitalization (including imaging, microbiology, ancillary and laboratory) are listed below for reference.    Microbiology: Recent Results (from the past 240 hour(s))  Blood Culture (routine x 2)     Status: Abnormal   Collection Time: 08/02/16  8:15 PM  Result Value Ref Range Status   Specimen Description BLOOD LEFT FOREARM  Final   Special Requests BOTTLES DRAWN AEROBIC AND ANAEROBIC 5CC  Final   Culture  Setup Time   Final    GRAM POSITIVE COCCI IN CLUSTERS IN BOTH AEROBIC AND ANAEROBIC BOTTLES CRITICAL RESULT CALLED TO, READ BACK BY AND VERIFIED WITH: Hughie Closs, PHARM AT [place] ON [address] BY M. WILSON    Culture METHICILLIN RESISTANT STAPHYLOCOCCUS AUREUS (A)  Final   Report Status 08/05/2016 FINAL  Final   Organism ID, Bacteria  METHICILLIN RESISTANT STAPHYLOCOCCUS AUREUS  Final      Susceptibility   Methicillin resistant staphylococcus aureus - MIC*    CIPROFLOXACIN >=8 RESISTANT Resistant     ERYTHROMYCIN >=8 RESISTANT Resistant     GENTAMICIN <=0.5 SENSITIVE Sensitive     OXACILLIN >=4 RESISTANT Resistant     TETRACYCLINE <=1 SENSITIVE Sensitive     VANCOMYCIN <=0.5 SENSITIVE Sensitive     TRIMETH/SULFA <=10 SENSITIVE Sensitive     CLINDAMYCIN <=0.25 RESISTANT Resistant     RIFAMPIN <=0.5 SENSITIVE Sensitive     Inducible Clindamycin POSITIVE Resistant     * METHICILLIN RESISTANT STAPHYLOCOCCUS AUREUS  Urine culture     Status: Abnormal   Collection Time: 08/02/16  8:15 PM  Result Value Ref Range Status   Specimen Description URINE, CATHETERIZED  Final   Special Requests NONE  Final   Culture (A)  Final    >=100,000 COLONIES/mL STAPHYLOCOCCUS SPECIES (COAGULASE NEGATIVE)   Report Status 08/05/2016 FINAL  Final   Organism ID, Bacteria STAPHYLOCOCCUS SPECIES (COAGULASE NEGATIVE) (A)  Final      Susceptibility   Staphylococcus species (coagulase negative) - MIC*    CIPROFLOXACIN >=8 RESISTANT Resistant     GENTAMICIN <=0.5 SENSITIVE Sensitive     NITROFURANTOIN <=16 SENSITIVE Sensitive     OXACILLIN >=4 RESISTANT Resistant     TETRACYCLINE <=1 SENSITIVE Sensitive     VANCOMYCIN <=0.5 SENSITIVE Sensitive     TRIMETH/SULFA <=10 SENSITIVE Sensitive     CLINDAMYCIN <=0.25 SENSITIVE Sensitive  RIFAMPIN <=0.5 SENSITIVE Sensitive     Inducible Clindamycin NEGATIVE Sensitive     * >=100,000 COLONIES/mL STAPHYLOCOCCUS SPECIES (COAGULASE NEGATIVE)  Blood Culture ID Panel (Reflexed)     Status: Abnormal   Collection Time: 08/02/16  8:15 PM  Result Value Ref Range Status   Enterococcus species NOT DETECTED NOT DETECTED Final   Listeria monocytogenes NOT DETECTED NOT DETECTED Final   Staphylococcus species DETECTED (A) NOT DETECTED Final    Comment: CRITICAL RESULT CALLED TO, READ BACK BY AND VERIFIED  WITH: M. MACCIA, PHARM AT [address] ON [address] BY S. YARBROUGH    Staphylococcus aureus DETECTED (A) NOT DETECTED Final    Comment: CRITICAL RESULT CALLED TO, READ BACK BY AND VERIFIED WITH: [place], PHARM AT [address] ON [address] BY S. YARBROUGH    Methicillin resistance DETECTED (A) NOT DETECTED Final    Comment: CRITICAL RESULT CALLED TO, READ BACK BY AND VERIFIED WITH: M. MACCIA, PHARM AT [address] ON [address] BY S. YARBROUGH    Streptococcus species NOT DETECTED NOT DETECTED Final   Streptococcus agalactiae NOT DETECTED NOT DETECTED Final   Streptococcus pneumoniae NOT DETECTED NOT DETECTED Final   Streptococcus pyogenes NOT DETECTED NOT DETECTED Final   Acinetobacter baumannii NOT DETECTED NOT DETECTED Final   Enterobacteriaceae species NOT DETECTED NOT DETECTED Final   Enterobacter cloacae complex NOT DETECTED NOT DETECTED Final   Escherichia coli NOT DETECTED NOT DETECTED Final   Klebsiella oxytoca NOT DETECTED NOT DETECTED Final   Klebsiella pneumoniae NOT DETECTED NOT DETECTED Final   Proteus species NOT DETECTED NOT DETECTED Final   Serratia marcescens NOT DETECTED NOT DETECTED Final   Haemophilus influenzae NOT DETECTED NOT DETECTED Final   Neisseria meningitidis NOT DETECTED NOT DETECTED Final   Pseudomonas aeruginosa NOT DETECTED NOT DETECTED Final   Candida albicans NOT DETECTED NOT DETECTED Final   Candida glabrata NOT DETECTED NOT DETECTED Final   Candida krusei NOT DETECTED NOT DETECTED Final   Candida parapsilosis NOT DETECTED NOT DETECTED Final   Candida tropicalis NOT DETECTED NOT DETECTED Final  Blood Culture (routine x 2)     Status: Abnormal   Collection Time: 08/02/16  8:35 PM  Result Value Ref Range Status   Specimen Description BLOOD LEFT FOREARM  Final   Special Requests IN PEDIATRIC BOTTLE 1CC  Final   Culture  Setup Time   Final    GRAM POSITIVE COCCI IN CLUSTERS IN PEDIATRIC BOTTLE CRITICAL VALUE NOTED.  VALUE IS CONSISTENT WITH PREVIOUSLY REPORTED AND CALLED  VALUE.    Culture (A)  Final    STAPHYLOCOCCUS AUREUS SUSCEPTIBILITIES PERFORMED ON PREVIOUS CULTURE WITHIN THE LAST 5 DAYS.    Report Status 08/05/2016 FINAL  Final  MRSA PCR Screening     Status: Abnormal   Collection Time: 08/03/16  3:40 PM  Result Value Ref Range Status   MRSA by PCR POSITIVE (A) NEGATIVE Final    Comment:        The GeneXpert MRSA Assay (FDA approved for NASAL specimens only), is one component of a comprehensive MRSA colonization surveillance program. It is not intended to diagnose MRSA infection nor to guide or monitor treatment for MRSA infections. RESULT CALLED TO, READ BACK BY AND VERIFIED WITH: Vladimir Creeks RN [id number] 08/03/16 A BROWNING   Aerobic/Anaerobic Culture (surgical/deep wound)     Status: None (Preliminary result)   Collection Time: 08/03/16  5:18 PM  Result Value Ref Range Status   Specimen Description WOUND  Final   Special Requests LUMBAR  Final  Gram Stain   Final    RARE WBC PRESENT,BOTH PMN AND MONONUCLEAR NO ORGANISMS SEEN    Culture   Final    FEW METHICILLIN RESISTANT STAPHYLOCOCCUS AUREUS HOLDING FOR POSSIBLE ANAEROBE    Report Status PENDING  Incomplete   Organism ID, Bacteria METHICILLIN RESISTANT STAPHYLOCOCCUS AUREUS  Final      Susceptibility   Methicillin resistant staphylococcus aureus - MIC*    CIPROFLOXACIN >=8 RESISTANT Resistant     ERYTHROMYCIN >=8 RESISTANT Resistant     GENTAMICIN <=0.5 SENSITIVE Sensitive     OXACILLIN >=4 RESISTANT Resistant     TETRACYCLINE <=1 SENSITIVE Sensitive     VANCOMYCIN <=0.5 SENSITIVE Sensitive     TRIMETH/SULFA <=10 SENSITIVE Sensitive     CLINDAMYCIN <=0.25 RESISTANT Resistant     RIFAMPIN <=0.5 SENSITIVE Sensitive     Inducible Clindamycin POSITIVE Resistant     * FEW METHICILLIN RESISTANT STAPHYLOCOCCUS AUREUS  Culture, blood (routine x 2)     Status: None (Preliminary result)   Collection Time: 08/05/16  4:27 AM  Result Value Ref Range Status   Specimen Description BLOOD  RIGHT HAND  Final   Special Requests IN PEDIATRIC BOTTLE 3ML  Final   Culture NO GROWTH 2 DAYS  Final   Report Status PENDING  Incomplete  Culture, blood (routine x 2)     Status: None (Preliminary result)   Collection Time: 08/05/16  4:40 AM  Result Value Ref Range Status   Specimen Description BLOOD LEFT HAND  Final   Special Requests AEROBIC BOTTLE ONLY 5ML  Final   Culture NO GROWTH 2 DAYS  Final   Report Status PENDING  Incomplete     Labs: Basic Metabolic Panel:  Recent Labs Lab 08/04/16 0741 08/05/16 0427 08/06/16 0505 08/07/16 0604 08/08/16 0445  NA 139 136 138 139 139  K 4.0 3.5 3.5 3.6 3.6  CL 105 105 102 100* 102  CO2 29 28 28  32 32  GLUCOSE 135* 127* 113* 124* 115*  BUN 19 16 11 8 9   CREATININE 0.82 0.69 0.62 0.66 0.68  CALCIUM 7.9* 8.0* 8.2* 8.5* 8.7*   Liver Function Tests:  Recent Labs Lab 08/02/16 2015 08/03/16 0335  AST 31 30  ALT 14 15  ALKPHOS 85 73  BILITOT 1.0 1.0  PROT 6.1* 5.0*  ALBUMIN 2.7* 2.1*   CBC:  Recent Labs Lab 08/02/16 2015 08/03/16 0335 08/04/16 0741 08/05/16 0427 08/06/16 0505 08/07/16 0604 08/08/16 0445  WBC 14.9* 9.4 8.7 8.3 10.8* 15.6* 14.7*  NEUTROABS 11.5* 7.3  --   --   --   --   --   HGB 10.2* 9.7* 8.8* 8.1* 8.2* 8.5* 8.1*  HCT 32.3* 31.3* 28.8* 26.8* 27.3* 27.8* 27.3*  MCV 92.3 94.0 94.4 93.7 93.5 92.4 93.2  PLT 262 195 214 206 246 239 221   Cardiac Enzymes:  Recent Labs Lab 08/03/16 0721 08/03/16 1230 08/03/16 2019 08/06/16 1356  CKTOTAL  --  491*  --  90  TROPONINI 0.09* 0.05* 0.03*  --     CBG:  Recent Labs Lab 08/03/16 1804  GLUCAP 112*     IMAGING STUDIES Dg Lumbar Spine 2-3 Views  Result Date: 07/10/2016 CLINICAL DATA:  L4-5 PLIF EXAM: LUMBAR SPINE - 2-3 VIEW; DG C-ARM 61-120 MIN COMPARISON:  Lumbar spine radiography from earlier today FINDINGS: Fluoroscopy shows L4-5 PLIF with rod and pedicle screws. Intervertebral cage is in good position. IMPRESSION: Fluoroscopy for L4-5 PLIF.   No unexpected finding. Electronically Signed  By: Monte Fantasia M.D.   On: 07/10/2016 15:16   Dg Lumbar Spine Complete  Result Date: 07/10/2016 CLINICAL DATA:  70 year old female for lumbar spine surgery. Subsequent encounter. EXAM: LUMBAR SPINE - COMPLETE 4+ VIEW COMPARISON:  05/31/2016 outside plain films. 03/13/2016 lumbar spine MR. FINDINGS: Four intraoperative lateral views of the lumbar spine are submitted for review (11:42 a.m.) after intraoperative lateral view performed 07/10/2016 8:51 a.m. was read by Dr. Martinique and called to operating room. First intraoperative film (8:19 a.m.) not able to provide level of metallic probe secondary patient's habitus. Second film (8:22 a.m.) with metallic probe directed toward the L4-5 level. Third film (8:44 a.m.) with metallic probe at the [id number] level. Evaluation limited by patient's habitus. Fourth film (8:22 a.m.) with metallic probe directed to just above the L4-5 level. IMPRESSION: Localization L4-5. Evaluation limited by patient's habitus. Please see above. Electronically Signed   By: Genia Del M.D.   On: 07/10/2016 11:57   Mr Lumbar Spine Wo Contrast  Result Date: 08/03/2016 CLINICAL DATA:  70 y/o  F; fever and sepsis. EXAM: MRI LUMBAR SPINE WITHOUT CONTRAST TECHNIQUE: Multiplanar, multisequence MR imaging of the lumbar spine was performed. No intravenous contrast was administered. COMPARISON:  07/27/2016 lumbar radiographs.  03/13/2016 lumbar MRI. FINDINGS: Segmentation:  Standard. Alignment:  Normal lumbar lordosis.  No listhesis. Vertebrae: Status post L4-5 posterior instrumented fusion, interbody fusion, and laminectomy. The vertebral bodies at those levels are partially obscured by susceptibility artifact from the fusion hardware. There is increased T2 signal of the L4-5 intervertebral disc without appreciable abnormal signal in the opposing articular endplates probably representing postsurgical changes related to the interbody fusion. No definite  evidence for discitis, fracture, or suspicious osseous lesion. Conus medullaris: Extends to the T12-L1 level and appears normal. Paraspinal and other soft tissues: Atrophic left kidney with multiple cysts. There is a fluid collection within the subcutaneous fat of the back at the level of surgical fusion extending to the myofascial boundary measuring approximately 50 x 70 x 70 mm (AP x ML x CC). Additionally, there is extensive edema in the paraspinal muscles at the site of surgery and at levels of laminectomy. Disc levels: L1-2: Small disc bulge and mild facet hypertrophy greater on the right with mild right-sided foraminal narrowing and lateral recess narrowing. L2-3: Small disc bulge and mild bilateral facet hypertrophy greater on the right. Right greater than left mild foraminal and lateral recess narrowing. L3-4: Small disc bulge and moderate ligamentum flavum/ facet hypertrophy. Mild bilateral foraminal narrowing, right greater than left lateral recess narrowing with contact upon descending right L4 nerve root. Mild canal stenosis. L4-5: Postsurgical changes related to discectomy and interbody fusion. There is intermediate T1 and high T2 signal within the paraspinal muscles and the laminectomy beds. There is encroachment on the posterior aspect of thecal sac bilaterally with moderate canal stenosis and foraminal narrowing bilaterally. L5-S1: Small central protrusion and mild facet arthropathy without significant foraminal narrowing or canal stenosis. IMPRESSION: 1. Edema within paraspinal muscles and the laminectomy bed at the L4-5 levels with encroachment on the thecal sac posteriorly at L4-5 with moderate canal stenosis and bilateral foraminal narrowing, probably postsurgical edema. Suboptimal assessment for abscess or epidural fluid collections in the absence of intravenous contrast. 2. Large fluid collection in the subcutaneous fat at level of surgery likely a postoperative seroma. 3. Increased T2 signal  within the L4-5 disc space without abnormal signal of opposing endplates is probably postoperative related to interbody fusion. No definite evidence for discitis. 4.  Otherwise multilevel degenerative changes with mild foraminal narrowing and mild canal stenosis at the L3-4 level. Electronically Signed   By: Kristine Garbe M.D.   On: 08/03/2016 00:51   Dg Lumbar Spine 1 View  Result Date: 07/10/2016 CLINICAL DATA:  Intraoperative localization radiographs EXAM: LUMBAR SPINE - 1 VIEW COMPARISON:  Lumbar spine series of May 31, 2016 FINDINGS: Two lateral lumbar radiographs timed at 8:51 a.m. are reviewed. Numbering of the vertebral levels is in accordance with previous MRI dated March 13, 2016. The metallic trocar lies approximately 1.5 cm posterior to the L4-5 disc space. This is the site of the grade 1 anterolisthesis. IMPRESSION: The metallic trocar lies 1.5 cm posterior to the margin of the L4-5 disc space. These results were called by telephone at the time of interpretation on 07/10/2016 at 9:18 am to Reece Levy, RN,, who verbally acknowledged these results. Electronically Signed   By: David  Martinique M.D.   On: 07/10/2016 09:19   Dg Chest Port 1 View  Result Date: 08/07/2016 CLINICAL DATA:  Central catheter placement EXAM: PORTABLE CHEST 1 VIEW COMPARISON:  August 02, 2016 and May 03, 2011 FINDINGS: Central catheter tip is in the superior vena cava. No pneumothorax. There is atelectatic change in the right lower lobe. There are subcentimeter nodular opacities in the left apex, stable from most recent study but not present on the 2012 study. Heart size and pulmonary vascularity are normal. No adenopathy. There is postoperative change in the right shoulder. IMPRESSION: Central catheter tip in superior vena cava. No pneumothorax. Right lower lobe atelectasis. Nodular opacities left apex. Advise noncontrast enhanced chest CT to further assess. Electronically Signed   By: Lowella Grip III M.D.    On: 08/07/2016 13:15   Dg Chest Port 1 View  Result Date: 08/02/2016 CLINICAL DATA:  Postop fever, hypoxia, and tachypnea. Recent back surgery. EXAM: PORTABLE CHEST 1 VIEW COMPARISON:  05/03/2011 FINDINGS: Mild cardiomegaly and tortuosity of thoracic aorta again noted. Aortic atherosclerosis. Both lungs are clear. No evidence of pneumothorax or pleural effusion. IMPRESSION: Stable mild cardiomegaly and aortic atherosclerosis. No active lung disease. Electronically Signed   By: Earle Gell M.D.   On: 08/02/2016 20:20   Dg C-arm 1-60 Min  Result Date: 07/10/2016 CLINICAL DATA:  L4-5 PLIF EXAM: LUMBAR SPINE - 2-3 VIEW; DG C-ARM 61-120 MIN COMPARISON:  Lumbar spine radiography from earlier today FINDINGS: Fluoroscopy shows L4-5 PLIF with rod and pedicle screws. Intervertebral cage is in good position. IMPRESSION: Fluoroscopy for L4-5 PLIF.  No unexpected finding. Electronically Signed   By: Monte Fantasia M.D.   On: 07/10/2016 15:16    DISCHARGE EXAMINATION: Vitals:   08/07/16 1617 08/07/16 2137 08/08/16 0455 08/08/16 0952  BP: (!) 152/67 (!) 134/59 (!) 146/62 (!) 140/50  Pulse: 90 78 74 80  Resp:  18 18   Temp: 98.7 F (37.1 C) 97.9 F (36.6 C) 98.2 F (36.8 C)   TempSrc: Oral Oral Oral   SpO2: 93% 99% 97%   Weight:      Height:       General appearance: alert, cooperative, appears stated age and no distress. Morbidly obese Resp: clear to auscultation bilaterally Cardio: regular rate and rhythm, S1, S2 normal, no murmur, click, rub or gallop GI: soft, non-tender; bowel sounds normal; no masses,  no organomegaly Extremities: extremities normal, atraumatic, no cyanosis or edema  DISPOSITION: SNF  Discharge Instructions    Call MD for:  difficulty breathing, headache or visual disturbances    Complete by:  As directed    Call MD for:  extreme fatigue    Complete by:  As directed    Call MD for:  persistant dizziness or light-headedness    Complete by:  As directed    Call MD  for:  persistant nausea and vomiting    Complete by:  As directed    Call MD for:  redness, tenderness, or signs of infection (pain, swelling, redness, odor or green/yellow discharge around incision site)    Complete by:  As directed    Call MD for:  severe uncontrolled pain    Complete by:  As directed    Call MD for:  temperature >100.4    Complete by:  As directed    Discharge instructions    Complete by:  As directed    You will need to undergo a sleep study when your back issues have resolved. Oxygen at nighttime. CK levels weekly while on daptomycin.  You were cared for by a hospitalist during your hospital stay. If you have any questions about your discharge medications or the care you received while you were in the hospital after you are discharged, you can call the unit and asked to speak with the hospitalist on call if the hospitalist that took care of you is not available. Once you are discharged, your primary care physician will handle any further medical issues. Please note that NO REFILLS for any discharge medications will be authorized once you are discharged, as it is imperative that you return to your primary care physician (or establish a relationship with a primary care physician if you do not have one) for your aftercare needs so that they can reassess your need for medications and monitor your lab values. If you do not have a primary care physician, you can call [phone] for a physician referral.   Discharge wound care:    Complete by:  As directed    Change dressing with dry guaze twice daily. Paint incision with betadine swab stick.   Increase activity slowly    Complete by:  As directed       ALLERGIES:  Allergies  Allergen Reactions  . Tape Itching, Dermatitis and Rash  . Band-Aid Plus Antibiotic [Bacitracin-Polymyxin B] Other (See Comments)    Unknown  . Iodinated Diagnostic Agents Itching  . Morphine Sulfate Itching  . Vancomycin Rash     Current Discharge  Medication List    START taking these medications   Details  DAPTOmycin 1,000 mg in sodium chloride 0.9 % 100 mL Inject 1,000 mg into the vein daily. Till December 15th    oxyCODONE 10 MG TABS Take 1 tablet (10 mg total) by mouth every 8 (eight) hours. Qty: 30 tablet, Refills: 0    polyethylene glycol (MIRALAX / GLYCOLAX) packet Take 17 g by mouth 2 (two) times daily. Qty: 14 each, Refills: 0    senna (SENOKOT) 8.6 MG TABS tablet Take 1 tablet (8.6 mg total) by mouth 2 (two) times daily. Qty: 120 each, Refills: 0      CONTINUE these medications which have CHANGED   Details  LORazepam (ATIVAN) 1 MG tablet Take 1 tablet (1 mg total) by mouth daily as needed for anxiety. Qty: 30 tablet, Refills: 0    methocarbamol (ROBAXIN) 500 MG tablet Take 1 tablet (500 mg total) by mouth every 6 (six) hours. Qty: 60 tablet, Refills: 0    oxyCODONE-acetaminophen (PERCOCET/ROXICET) 5-325 MG tablet Take 2 tablets by mouth every 4 (four) hours as  needed for moderate pain. Breakthrough pain Qty: 30 tablet, Refills: 0    traZODone (DESYREL) 50 MG tablet Take 1 tablet (50 mg total) by mouth at bedtime. Qty: 30 tablet, Refills: 0      CONTINUE these medications which have NOT CHANGED   Details  acetaminophen (TYLENOL) 325 MG tablet Take 650 mg by mouth every 6 (six) hours as needed for mild pain.    acidophilus (RISAQUAD) CAPS capsule Take 1 capsule by mouth daily.    amLODipine (NORVASC) 5 MG tablet Take 1 tablet (5 mg total) by mouth daily. Qty: 90 tablet, Refills: 4   Associated Diagnoses: Essential hypertension    ARTIFICIAL TEARS 0.1-0.3 % SOLN Place 1 drop into both eyes daily as needed (dry eyes).    Ascorbic Acid (VITAMIN C) 1000 MG tablet Take 1,000 mg by mouth daily.    aspirin EC 81 MG tablet Take 81 mg by mouth daily.    Calcium Carb-Cholecalciferol (CALCIUM 600+D) 600-800 MG-UNIT TABS Take 1 tablet by mouth daily.     Cholecalciferol (D3-1000) 1000 units capsule Take 1,000 Units  by mouth daily.    conjugated estrogens (PREMARIN) vaginal cream Apply a blueberry sized amount to urethra using fingertip nightly x 2 weeks then 3 times weekly at bedtime. Qty: 42.5 g, Refills: 12    CRANBERRY PO Take 1-2 capsules by mouth See admin instructions. Take 1 capsule by mouth in the morning and take 2 capsules by mouth in the evening.    magnesium oxide (MAG-OX) 400 MG tablet Take 400 mg by mouth 2 (two) times daily.    Melatonin 10 MG CAPS Take 10 mg by mouth at bedtime.     Multiple Vitamin (MULTIVITAMIN) tablet Take 1 tablet by mouth daily.    omeprazole (PRILOSEC) 40 MG capsule Take 1 capsule (40 mg total) by mouth 2 (two) times daily. Qty: 180 capsule, Refills: 4    polyvinyl alcohol (LIQUIFILM TEARS) 1.4 % ophthalmic solution 1 drop as needed for dry eyes.    vitamin E (VITAMIN E) 400 UNIT capsule Take 400 Units by mouth daily.      STOP taking these medications     ciprofloxacin (CIPRO) 500 MG tablet      hydrochlorothiazide (HYDRODIURIL) 25 MG tablet      losartan (COZAAR) 100 MG tablet      meloxicam (MOBIC) 15 MG tablet      Misc Natural Products (TURMERIC CURCUMIN) CAPS      oxycodone (OXY-IR) 5 MG capsule      Potassium 99 MG TABS      traMADol (ULTRAM) 50 MG tablet      Turmeric Curcumin 500 MG CAPS          Follow-up Information    Golden Pop, MD. Schedule an appointment as soon as possible for a visit in 1 week(s).   Specialty:  Family Medicine Contact information: [place] [address] [phone]        Earleen Newport, MD. Schedule an appointment as soon as possible for a visit in 2 week(s).   Specialty:  Neurosurgery Contact information: [address] [place] [address] [phone]           TOTAL DISCHARGE TIME: 75 minutes  [facility]  [facility] Pager [phone]  08/08/2016, 11:07 AM

## 2016-08-08 NOTE — Progress Notes (Signed)
Patient will discharge to [facility] Anticipated discharge date: 11/7 Family notified: Shanon Brow Transportation by Corey Harold- scheduled for 2pm  CSW signing off.  Jorge Ny, LCSW Clinical Social Worker [phone]

## 2016-08-08 NOTE — Progress Notes (Signed)
IV team was called to assess the pt's PICC line. IV team reports site was okay upon their assessment. Will continue to monitor.

## 2016-08-08 NOTE — Clinical Social Work Placement (Signed)
   CLINICAL SOCIAL WORK PLACEMENT  NOTE  Date:  08/08/2016  Patient Details  Name: Tracy Espinoza MRN: [id number] Date of Birth: Aug 21, 1946  Clinical Social Work is seeking post-discharge placement for this patient at the [place] level of care (*CSW will initial, date and re-position this form in  chart as items are completed):  Yes   Patient/family provided with [facility] Work Department's list of facilities offering this level of care within the geographic area requested by the patient (or if unable, by the patient's family).  Yes   Patient/family informed of their freedom to choose among providers that offer the needed level of care, that participate in Medicare, Medicaid or managed care program needed by the patient, have an available bed and are willing to accept the patient.  Yes   Patient/family informed of [facility]'s ownership interest in [facility] and [facility], as well as of the fact that they are under no obligation to receive care at these facilities.  PASRR submitted to EDS on       PASRR number received on       Existing PASRR number confirmed on 08/07/16     FL2 transmitted to all facilities in geographic area requested by pt/family on 08/07/16     FL2 transmitted to all facilities within larger geographic area on       Patient informed that his/her managed care company has contracts with or will negotiate with certain facilities, including the following:        Yes   Patient/family informed of bed offers received.  Patient chooses bed at [facility]     Physician recommends and patient chooses bed at      Patient to be transferred to [facility] on 08/08/16.  Patient to be transferred to facility by ptar     Patient family notified on 08/08/16 of transfer.  Name of family member notified:  david     PHYSICIAN Please sign FL2     Additional Comment:     _______________________________________________ Jorge Ny, LCSW 08/08/2016, 12:16 PM

## 2016-08-08 NOTE — Progress Notes (Signed)
Report called to [facility]

## 2016-08-08 NOTE — Progress Notes (Signed)
Physical Therapy Treatment Patient Details Name: Tracy Espinoza MRN: [id number] DOB: [birth year] Today's Date: 08/08/2016    History of Present Illness pt is a 70 y/o female with h/o HTN, TKA, rot. cuff surgery, recent L45 decompressive lami and PLA, admitted 2 days after d/c home from SNF with sepsis. s/p I&D of back.    PT Comments    Pt remains to move slow and guarded with DOE.  Continue to recommend Short term rehab at SNF to improve function and decrease pain before returning home.    Follow Up Recommendations  SNF     Equipment Recommendations  None recommended by PT    Recommendations for Other Services       Precautions / Restrictions Precautions Precautions: Back;Fall Precaution Booklet Issued: Yes (comment) Precaution Comments: reviewed spinal precautions Required Braces or Orthoses: Spinal Brace Spinal Brace: Lumbar corset;Applied in sitting position Restrictions Weight Bearing Restrictions: No Other Position/Activity Restrictions: per MD order, does not require back brace, but pt preferring to use her lumbar corset (donned in sitting)    Mobility  Bed Mobility Overal bed mobility: Needs Assistance         Sit to supine: Mod assist (to lift B LEs into supine position.  )   General bed mobility comments: max v/c's to adhere to precautions, assist for trunk management and LE management  Transfers Overall transfer level: Needs assistance Equipment used: Rolling walker (2 wheeled) Transfers: Sit to/from Stand Sit to Stand: Min assist         General transfer comment: Pt required multiple attempts but required decreased assistance.    Ambulation/Gait Ambulation/Gait assistance: Min guard Ambulation Distance (Feet): 45 Feet Assistive device: Rolling walker (2 wheeled) Gait Pattern/deviations: Step-to pattern;Antalgic;Wide base of support;Shuffle Gait velocity: slow   General Gait Details: Remains extremely slow.  Cues for pacing and pursed lip  breathing.     Stairs            Wheelchair Mobility    Modified Rankin (Stroke Patients Only)       Balance Overall balance assessment: Needs assistance   Sitting balance-Leahy Scale: Fair       Standing balance-Leahy Scale: Poor Standing balance comment: Pt require total assist for perianal care.                      Cognition Arousal/Alertness: Awake/alert Behavior During Therapy: WFL for tasks assessed/performed Overall Cognitive Status: Impaired/Different from baseline                      Exercises      General Comments        Pertinent Vitals/Pain Pain Assessment: 0-10 Pain Score: 8  Pain Location: R hip  Pain Descriptors / Indicators: Sharp Pain Intervention(s): Monitored during session;Repositioned;Ice applied    Home Living                      Prior Function            PT Goals (current goals can now be found in the care plan section) Acute Rehab PT Goals Patient Stated Goal: return to independence PT Goal Formulation: With patient/family Potential to Achieve Goals: Good Progress towards PT goals: Progressing toward goals    Frequency    Min 4X/week      PT Plan Current plan remains appropriate    Co-evaluation             End of Session Equipment  Utilized During Treatment: Gait belt;Back brace Activity Tolerance: Patient limited by pain Patient left: with call bell/phone within reach;with family/visitor present;in bed     Time: [id number] PT Time Calculation (min) (ACUTE ONLY): 39 min  Charges:  $Gait Training: 8-22 mins $Therapeutic Activity: 23-37 mins                    G Codes:      Cristela Blue 08/26/2016, 2:28 PM  Governor Rooks, PTA pager [phone]

## 2016-08-09 LAB — AEROBIC/ANAEROBIC CULTURE (SURGICAL/DEEP WOUND)

## 2016-08-09 LAB — AEROBIC/ANAEROBIC CULTURE W GRAM STAIN (SURGICAL/DEEP WOUND)

## 2016-08-10 DIAGNOSIS — A4102 Sepsis due to Methicillin resistant Staphylococcus aureus: Secondary | ICD-10-CM | POA: Diagnosis not present

## 2016-08-10 DIAGNOSIS — D649 Anemia, unspecified: Secondary | ICD-10-CM | POA: Diagnosis not present

## 2016-08-10 DIAGNOSIS — M479 Spondylosis, unspecified: Secondary | ICD-10-CM | POA: Diagnosis not present

## 2016-08-10 DIAGNOSIS — F419 Anxiety disorder, unspecified: Secondary | ICD-10-CM | POA: Diagnosis not present

## 2016-08-10 LAB — CULTURE, BLOOD (ROUTINE X 2)
CULTURE: NO GROWTH
Culture: NO GROWTH

## 2016-08-18 DIAGNOSIS — F418 Other specified anxiety disorders: Secondary | ICD-10-CM | POA: Diagnosis not present

## 2016-08-18 DIAGNOSIS — D649 Anemia, unspecified: Secondary | ICD-10-CM | POA: Diagnosis not present

## 2016-08-18 DIAGNOSIS — N179 Acute kidney failure, unspecified: Secondary | ICD-10-CM | POA: Diagnosis not present

## 2016-08-18 DIAGNOSIS — M9689 Other intraoperative and postprocedural complications and disorders of the musculoskeletal system: Secondary | ICD-10-CM | POA: Diagnosis not present

## 2016-08-19 ENCOUNTER — Inpatient Hospital Stay: Payer: Medicare Other

## 2016-08-19 ENCOUNTER — Encounter: Payer: Self-pay | Admitting: Emergency Medicine

## 2016-08-19 ENCOUNTER — Inpatient Hospital Stay
Admission: EM | Admit: 2016-08-19 | Discharge: 2016-08-24 | DRG: 871 | Disposition: A | Discharge status: Skilled Nursing Facility | Payer: Medicare Other | Attending: Internal Medicine | Admitting: Internal Medicine

## 2016-08-19 ENCOUNTER — Emergency Department: Payer: Medicare Other

## 2016-08-19 DIAGNOSIS — J9601 Acute respiratory failure with hypoxia: Secondary | ICD-10-CM | POA: Diagnosis not present

## 2016-08-19 DIAGNOSIS — T368X5A Adverse effect of other systemic antibiotics, initial encounter: Secondary | ICD-10-CM | POA: Diagnosis present

## 2016-08-19 DIAGNOSIS — F419 Anxiety disorder, unspecified: Secondary | ICD-10-CM | POA: Diagnosis present

## 2016-08-19 DIAGNOSIS — J9602 Acute respiratory failure with hypercapnia: Secondary | ICD-10-CM | POA: Diagnosis present

## 2016-08-19 DIAGNOSIS — A4102 Sepsis due to Methicillin resistant Staphylococcus aureus: Secondary | ICD-10-CM | POA: Diagnosis not present

## 2016-08-19 DIAGNOSIS — Z7982 Long term (current) use of aspirin: Secondary | ICD-10-CM

## 2016-08-19 DIAGNOSIS — R0600 Dyspnea, unspecified: Secondary | ICD-10-CM | POA: Diagnosis not present

## 2016-08-19 DIAGNOSIS — R0902 Hypoxemia: Secondary | ICD-10-CM | POA: Diagnosis present

## 2016-08-19 DIAGNOSIS — Y95 Nosocomial condition: Secondary | ICD-10-CM | POA: Diagnosis present

## 2016-08-19 DIAGNOSIS — Z91048 Other nonmedicinal substance allergy status: Secondary | ICD-10-CM

## 2016-08-19 DIAGNOSIS — R262 Difficulty in walking, not elsewhere classified: Secondary | ICD-10-CM | POA: Diagnosis not present

## 2016-08-19 DIAGNOSIS — R0602 Shortness of breath: Secondary | ICD-10-CM

## 2016-08-19 DIAGNOSIS — A419 Sepsis, unspecified organism: Secondary | ICD-10-CM | POA: Diagnosis not present

## 2016-08-19 DIAGNOSIS — M6281 Muscle weakness (generalized): Secondary | ICD-10-CM

## 2016-08-19 DIAGNOSIS — Z823 Family history of stroke: Secondary | ICD-10-CM | POA: Diagnosis not present

## 2016-08-19 DIAGNOSIS — Z809 Family history of malignant neoplasm, unspecified: Secondary | ICD-10-CM

## 2016-08-19 DIAGNOSIS — M1990 Unspecified osteoarthritis, unspecified site: Secondary | ICD-10-CM | POA: Diagnosis present

## 2016-08-19 DIAGNOSIS — N3000 Acute cystitis without hematuria: Secondary | ICD-10-CM | POA: Diagnosis present

## 2016-08-19 DIAGNOSIS — Z8614 Personal history of Methicillin resistant Staphylococcus aureus infection: Secondary | ICD-10-CM | POA: Diagnosis not present

## 2016-08-19 DIAGNOSIS — Z85828 Personal history of other malignant neoplasm of skin: Secondary | ICD-10-CM

## 2016-08-19 DIAGNOSIS — J82 Pulmonary eosinophilia, not elsewhere classified: Secondary | ICD-10-CM | POA: Diagnosis present

## 2016-08-19 DIAGNOSIS — F411 Generalized anxiety disorder: Secondary | ICD-10-CM | POA: Diagnosis not present

## 2016-08-19 DIAGNOSIS — J189 Pneumonia, unspecified organism: Secondary | ICD-10-CM | POA: Diagnosis not present

## 2016-08-19 DIAGNOSIS — Z96653 Presence of artificial knee joint, bilateral: Secondary | ICD-10-CM | POA: Diagnosis present

## 2016-08-19 DIAGNOSIS — I10 Essential (primary) hypertension: Secondary | ICD-10-CM | POA: Diagnosis present

## 2016-08-19 DIAGNOSIS — R41841 Cognitive communication deficit: Secondary | ICD-10-CM | POA: Diagnosis not present

## 2016-08-19 DIAGNOSIS — K219 Gastro-esophageal reflux disease without esophagitis: Secondary | ICD-10-CM | POA: Diagnosis present

## 2016-08-19 DIAGNOSIS — D649 Anemia, unspecified: Secondary | ICD-10-CM | POA: Diagnosis present

## 2016-08-19 DIAGNOSIS — J9690 Respiratory failure, unspecified, unspecified whether with hypoxia or hypercapnia: Secondary | ICD-10-CM

## 2016-08-19 DIAGNOSIS — E669 Obesity, unspecified: Secondary | ICD-10-CM | POA: Diagnosis present

## 2016-08-19 DIAGNOSIS — L03312 Cellulitis of back [any part except buttock]: Secondary | ICD-10-CM | POA: Diagnosis not present

## 2016-08-19 DIAGNOSIS — J9600 Acute respiratory failure, unspecified whether with hypoxia or hypercapnia: Secondary | ICD-10-CM

## 2016-08-19 DIAGNOSIS — Z6841 Body Mass Index (BMI) 40.0 and over, adult: Secondary | ICD-10-CM | POA: Diagnosis not present

## 2016-08-19 DIAGNOSIS — M4727 Other spondylosis with radiculopathy, lumbosacral region: Secondary | ICD-10-CM | POA: Diagnosis not present

## 2016-08-19 DIAGNOSIS — G4700 Insomnia, unspecified: Secondary | ICD-10-CM | POA: Diagnosis not present

## 2016-08-19 DIAGNOSIS — K449 Diaphragmatic hernia without obstruction or gangrene: Secondary | ICD-10-CM | POA: Diagnosis present

## 2016-08-19 DIAGNOSIS — Z820 Family history of epilepsy and other diseases of the nervous system: Secondary | ICD-10-CM

## 2016-08-19 DIAGNOSIS — R0609 Other forms of dyspnea: Secondary | ICD-10-CM | POA: Diagnosis not present

## 2016-08-19 DIAGNOSIS — B9620 Unspecified Escherichia coli [E. coli] as the cause of diseases classified elsewhere: Secondary | ICD-10-CM | POA: Diagnosis present

## 2016-08-19 DIAGNOSIS — Z91041 Radiographic dye allergy status: Secondary | ICD-10-CM

## 2016-08-19 DIAGNOSIS — M545 Low back pain: Secondary | ICD-10-CM | POA: Diagnosis present

## 2016-08-19 DIAGNOSIS — R739 Hyperglycemia, unspecified: Secondary | ICD-10-CM | POA: Diagnosis present

## 2016-08-19 DIAGNOSIS — Z881 Allergy status to other antibiotic agents status: Secondary | ICD-10-CM

## 2016-08-19 DIAGNOSIS — Z22322 Carrier or suspected carrier of Methicillin resistant Staphylococcus aureus: Secondary | ICD-10-CM

## 2016-08-19 DIAGNOSIS — R6520 Severe sepsis without septic shock: Secondary | ICD-10-CM | POA: Diagnosis not present

## 2016-08-19 DIAGNOSIS — Z5189 Encounter for other specified aftercare: Secondary | ICD-10-CM | POA: Diagnosis not present

## 2016-08-19 DIAGNOSIS — D638 Anemia in other chronic diseases classified elsewhere: Secondary | ICD-10-CM | POA: Diagnosis present

## 2016-08-19 DIAGNOSIS — N390 Urinary tract infection, site not specified: Secondary | ICD-10-CM | POA: Diagnosis present

## 2016-08-19 DIAGNOSIS — Z885 Allergy status to narcotic agent status: Secondary | ICD-10-CM

## 2016-08-19 DIAGNOSIS — G9341 Metabolic encephalopathy: Secondary | ICD-10-CM | POA: Diagnosis present

## 2016-08-19 DIAGNOSIS — I25810 Atherosclerosis of coronary artery bypass graft(s) without angina pectoris: Secondary | ICD-10-CM | POA: Diagnosis not present

## 2016-08-19 DIAGNOSIS — Z8249 Family history of ischemic heart disease and other diseases of the circulatory system: Secondary | ICD-10-CM

## 2016-08-19 DIAGNOSIS — R0603 Acute respiratory distress: Secondary | ICD-10-CM

## 2016-08-19 DIAGNOSIS — Z7401 Bed confinement status: Secondary | ICD-10-CM | POA: Diagnosis not present

## 2016-08-19 DIAGNOSIS — M48062 Spinal stenosis, lumbar region with neurogenic claudication: Secondary | ICD-10-CM | POA: Diagnosis not present

## 2016-08-19 LAB — COMPREHENSIVE METABOLIC PANEL
ALBUMIN: 2.5 g/dL — AB (ref 3.5–5.0)
ALK PHOS: 56 U/L (ref 38–126)
ALT: 10 U/L — AB (ref 14–54)
ANION GAP: 6 (ref 5–15)
AST: 19 U/L (ref 15–41)
BUN: 21 mg/dL — ABNORMAL HIGH (ref 6–20)
CALCIUM: 8.2 mg/dL — AB (ref 8.9–10.3)
CHLORIDE: 97 mmol/L — AB (ref 101–111)
CO2: 32 mmol/L (ref 22–32)
CREATININE: 0.91 mg/dL (ref 0.44–1.00)
GFR calc non Af Amer: >60 mL/min (ref >60)
GLUCOSE: 179 mg/dL — AB (ref 65–99)
Potassium: 3.9 mmol/L (ref 3.5–5.1)
SODIUM: 135 mmol/L (ref 135–145)
Total Bilirubin: 0.6 mg/dL (ref 0.3–1.2)
Total Protein: 6.7 g/dL (ref 6.5–8.1)

## 2016-08-19 LAB — BLOOD GAS, ARTERIAL
ACID-BASE EXCESS: 13.1 mmol/L — AB (ref 0.0–2.0)
Acid-Base Excess: 11.7 mmol/L — ABNORMAL HIGH (ref 0.0–2.0)
BICARBONATE: 38 mmol/L — AB (ref 20.0–28.0)
BICARBONATE: 39.2 mmol/L — AB (ref 20.0–28.0)
DELIVERY SYSTEMS: POSITIVE
EXPIRATORY PAP: 6
FIO2: 0.5
FIO2: 1
INSPIRATORY PAP: 12
MECHANICAL RATE: 8
O2 Saturation: 96.5 %
O2 Saturation: 98.5 %
PCO2 ART: 59 mmHg — AB (ref 32.0–48.0)
PH ART: 7.41 (ref 7.350–7.450)
PH ART: 7.43 (ref 7.350–7.450)
Patient temperature: 37
Patient temperature: 37
pCO2 arterial: 60 mmHg — ABNORMAL HIGH (ref 32.0–48.0)
pO2, Arterial: 85 mmHg (ref 83.0–108.0)
pO2, Arterial: 113 mmHg — ABNORMAL HIGH (ref 83.0–108.0)

## 2016-08-19 LAB — BASIC METABOLIC PANEL
Anion gap: 5 (ref 5–15)
BUN: 15 mg/dL (ref 6–20)
CALCIUM: 8.2 mg/dL — AB (ref 8.9–10.3)
CO2: 36 mmol/L — ABNORMAL HIGH (ref 22–32)
CREATININE: 0.75 mg/dL (ref 0.44–1.00)
Chloride: 94 mmol/L — ABNORMAL LOW (ref 101–111)
GFR calc Af Amer: >60 mL/min (ref >60)
GLUCOSE: 128 mg/dL — AB (ref 65–99)
Potassium: 4 mmol/L (ref 3.5–5.1)
Sodium: 135 mmol/L (ref 135–145)

## 2016-08-19 LAB — CBC WITH DIFFERENTIAL/PLATELET
BASOS PCT: 0 %
Basophils Absolute: 0 10*3/uL (ref 0–0.1)
Eosinophils Absolute: 0.8 10*3/uL — ABNORMAL HIGH (ref 0–0.7)
Eosinophils Relative: 6 %
HEMATOCRIT: 29.3 % — AB (ref 35.0–47.0)
HEMOGLOBIN: 9.2 g/dL — AB (ref 12.0–16.0)
LYMPHS ABS: 3.1 10*3/uL (ref 1.0–3.6)
Lymphocytes Relative: 23 %
MCH: 27.9 pg (ref 26.0–34.0)
MCHC: 31.5 g/dL — AB (ref 32.0–36.0)
MCV: 88.7 fL (ref 80.0–100.0)
MONO ABS: 1 10*3/uL — AB (ref 0.2–0.9)
MONOS PCT: 7 %
NEUTROS ABS: 8.4 10*3/uL — AB (ref 1.4–6.5)
NEUTROS PCT: 64 %
Platelets: 349 10*3/uL (ref 150–440)
RBC: 3.3 MIL/uL — ABNORMAL LOW (ref 3.80–5.20)
RDW: 15.9 % — AB (ref 11.5–14.5)
WBC: 13.3 10*3/uL — ABNORMAL HIGH (ref 3.6–11.0)

## 2016-08-19 LAB — PHOSPHORUS: Phosphorus: 2.8 mg/dL (ref 2.5–4.6)

## 2016-08-19 LAB — CBC
HCT: 27 % — ABNORMAL LOW (ref 35.0–47.0)
Hemoglobin: 8.7 g/dL — ABNORMAL LOW (ref 12.0–16.0)
MCH: 28 pg (ref 26.0–34.0)
MCHC: 32.3 g/dL (ref 32.0–36.0)
MCV: 86.5 fL (ref 80.0–100.0)
PLATELETS: 313 10*3/uL (ref 150–440)
RBC: 3.12 MIL/uL — ABNORMAL LOW (ref 3.80–5.20)
RDW: 15.8 % — AB (ref 11.5–14.5)
WBC: 11.7 10*3/uL — AB (ref 3.6–11.0)

## 2016-08-19 LAB — URINALYSIS COMPLETE WITH MICROSCOPIC (ARMC ONLY)
Bilirubin Urine: NEGATIVE
Glucose, UA: NEGATIVE mg/dL
KETONES UR: NEGATIVE mg/dL
NITRITE: NEGATIVE
PROTEIN: 100 mg/dL — AB
SPECIFIC GRAVITY, URINE: 1.018 (ref 1.005–1.030)
pH: 5 (ref 5.0–8.0)

## 2016-08-19 LAB — MRSA PCR SCREENING: MRSA BY PCR: NEGATIVE

## 2016-08-19 LAB — MAGNESIUM: Magnesium: 1.7 mg/dL (ref 1.7–2.4)

## 2016-08-19 LAB — LACTIC ACID, PLASMA
LACTIC ACID, VENOUS: 1.5 mmol/L (ref 0.5–1.9)
Lactic Acid, Venous: 0.9 mmol/L (ref 0.5–1.9)

## 2016-08-19 LAB — PROCALCITONIN: PROCALCITONIN: 0.31 ng/mL

## 2016-08-19 MED ORDER — OXYCODONE HCL 5 MG PO TABS
10.0000 mg | ORAL_TABLET | Freq: Three times a day (TID) | ORAL | Status: DC
  Administered 2016-08-19 – 2016-08-20 (×4): 10 mg via ORAL
  Filled 2016-08-19 (×4): qty 2

## 2016-08-19 MED ORDER — TRAZODONE HCL 50 MG PO TABS
50.0000 mg | ORAL_TABLET | Freq: Every day | ORAL | Status: DC
  Administered 2016-08-19 – 2016-08-23 (×5): 50 mg via ORAL
  Filled 2016-08-19 (×5): qty 1

## 2016-08-19 MED ORDER — SODIUM CHLORIDE 0.9% FLUSH
3.0000 mL | Freq: Two times a day (BID) | INTRAVENOUS | Status: DC
  Administered 2016-08-19 – 2016-08-20 (×3): 3 mL via INTRAVENOUS

## 2016-08-19 MED ORDER — ONDANSETRON HCL 4 MG/2ML IJ SOLN
4.0000 mg | INTRAMUSCULAR | Status: DC | PRN
  Administered 2016-08-19 – 2016-08-23 (×5): 4 mg via INTRAVENOUS
  Filled 2016-08-19 (×5): qty 2

## 2016-08-19 MED ORDER — POLYVINYL ALCOHOL 1.4 % OP SOLN
1.0000 [drp] | Freq: Every day | OPHTHALMIC | Status: DC | PRN
  Filled 2016-08-19: qty 15

## 2016-08-19 MED ORDER — ASPIRIN EC 81 MG PO TBEC
81.0000 mg | DELAYED_RELEASE_TABLET | Freq: Every day | ORAL | Status: DC
  Administered 2016-08-19 – 2016-08-24 (×6): 81 mg via ORAL
  Filled 2016-08-19 (×6): qty 1

## 2016-08-19 MED ORDER — MAGNESIUM OXIDE 400 MG PO TABS
400.0000 mg | ORAL_TABLET | Freq: Two times a day (BID) | ORAL | Status: DC
Start: 2016-08-19 — End: 2016-08-24
  Administered 2016-08-19 – 2016-08-24 (×10): 400 mg via ORAL
  Filled 2016-08-19 (×17): qty 1

## 2016-08-19 MED ORDER — SODIUM CHLORIDE 0.9 % IV SOLN: 250.0000 mL | INTRAVENOUS | Status: DC | PRN

## 2016-08-19 MED ORDER — HYDROMORPHONE HCL 1 MG/ML IJ SOLN
0.5000 mg | INTRAMUSCULAR | Status: DC | PRN
  Administered 2016-08-19: 0.5 mg via INTRAVENOUS
  Filled 2016-08-19: qty 1

## 2016-08-19 MED ORDER — ONDANSETRON HCL 4 MG PO TABS: 4.0000 mg | ORAL_TABLET | Freq: Four times a day (QID) | ORAL | Status: DC | PRN

## 2016-08-19 MED ORDER — OXYCODONE-ACETAMINOPHEN 5-325 MG PO TABS
2.0000 | ORAL_TABLET | ORAL | Status: DC | PRN
  Administered 2016-08-21 – 2016-08-24 (×10): 2 via ORAL
  Filled 2016-08-19 (×10): qty 2

## 2016-08-19 MED ORDER — SODIUM CHLORIDE 0.9% FLUSH
3.0000 mL | Freq: Two times a day (BID) | INTRAVENOUS | Status: DC
  Administered 2016-08-19 – 2016-08-23 (×10): 3 mL via INTRAVENOUS

## 2016-08-19 MED ORDER — CEFEPIME-DEXTROSE 2 GM/50ML IV SOLR: 2.0000 g | Freq: Three times a day (TID) | INTRAVENOUS | Status: DC

## 2016-08-19 MED ORDER — MELATONIN 5 MG PO TABS
10.0000 mg | ORAL_TABLET | Freq: Every day | ORAL | Status: DC
  Filled 2016-08-19: qty 2

## 2016-08-19 MED ORDER — HYDRALAZINE HCL 20 MG/ML IJ SOLN: 10.0000 mg | INTRAMUSCULAR | Status: DC | PRN

## 2016-08-19 MED ORDER — ENOXAPARIN SODIUM 40 MG/0.4ML ~~LOC~~ SOLN
40.0000 mg | SUBCUTANEOUS | Status: DC
  Administered 2016-08-19 – 2016-08-20 (×3): 40 mg via SUBCUTANEOUS
  Filled 2016-08-19 (×4): qty 0.4

## 2016-08-19 MED ORDER — CHOLECALCIFEROL 25 MCG (1000 UT) PO CAPS
1000.0000 [IU] | ORAL_CAPSULE | Freq: Every day | ORAL | Status: DC
  Filled 2016-08-19: qty 1

## 2016-08-19 MED ORDER — POLYETHYLENE GLYCOL 3350 17 G PO PACK
17.0000 g | PACK | Freq: Two times a day (BID) | ORAL | Status: DC
  Administered 2016-08-19 – 2016-08-24 (×7): 17 g via ORAL
  Filled 2016-08-19 (×8): qty 1

## 2016-08-19 MED ORDER — ONDANSETRON HCL 4 MG/2ML IJ SOLN
4.0000 mg | Freq: Four times a day (QID) | INTRAMUSCULAR | Status: DC | PRN
  Administered 2016-08-19: 4 mg via INTRAVENOUS
  Filled 2016-08-19: qty 2

## 2016-08-19 MED ORDER — PANTOPRAZOLE SODIUM 40 MG PO TBEC: 40.0000 mg | DELAYED_RELEASE_TABLET | Freq: Every day | ORAL | Status: DC

## 2016-08-19 MED ORDER — CALCIUM CARBONATE ANTACID 500 MG PO CHEW
1.0000 | CHEWABLE_TABLET | Freq: Every day | ORAL | Status: DC
  Administered 2016-08-19 – 2016-08-24 (×6): 200 mg via ORAL
  Filled 2016-08-19 (×7): qty 1

## 2016-08-19 MED ORDER — SENNA 8.6 MG PO TABS
1.0000 | ORAL_TABLET | Freq: Two times a day (BID) | ORAL | Status: DC
  Administered 2016-08-20 – 2016-08-24 (×9): 8.6 mg via ORAL
  Filled 2016-08-19 (×10): qty 1

## 2016-08-19 MED ORDER — LINEZOLID 600 MG/300ML IV SOLN
600.0000 mg | Freq: Two times a day (BID) | INTRAVENOUS | Status: DC
  Administered 2016-08-19 – 2016-08-21 (×5): 600 mg via INTRAVENOUS
  Filled 2016-08-19 (×6): qty 300

## 2016-08-19 MED ORDER — CEFEPIME-DEXTROSE 2 GM/50ML IV SOLR
2.0000 g | Freq: Once | INTRAVENOUS | Status: AC
  Administered 2016-08-19: 2 g via INTRAVENOUS
  Filled 2016-08-19: qty 50

## 2016-08-19 MED ORDER — ARTIFICIAL TEARS 0.1-0.3 % OP SOLN
1.0000 [drp] | Freq: Every day | OPHTHALMIC | Status: DC | PRN
  Filled 2016-08-19: qty 0.1

## 2016-08-19 MED ORDER — FUROSEMIDE 10 MG/ML IJ SOLN
40.0000 mg | Freq: Two times a day (BID) | INTRAMUSCULAR | Status: AC
  Administered 2016-08-19 – 2016-08-20 (×3): 40 mg via INTRAVENOUS
  Filled 2016-08-19 (×3): qty 4

## 2016-08-19 MED ORDER — METHOCARBAMOL 500 MG PO TABS
500.0000 mg | ORAL_TABLET | Freq: Four times a day (QID) | ORAL | Status: DC
  Administered 2016-08-19 – 2016-08-24 (×19): 500 mg via ORAL
  Filled 2016-08-19 (×20): qty 1

## 2016-08-19 MED ORDER — RISAQUAD PO CAPS
1.0000 | ORAL_CAPSULE | Freq: Every day | ORAL | Status: DC
  Administered 2016-08-19 – 2016-08-24 (×6): 1 via ORAL
  Filled 2016-08-19 (×6): qty 1

## 2016-08-19 MED ORDER — MELATONIN 10 MG PO CAPS
10.0000 mg | ORAL_CAPSULE | Freq: Every day | ORAL | Status: DC
  Filled 2016-08-19: qty 1

## 2016-08-19 MED ORDER — PANTOPRAZOLE SODIUM 40 MG IV SOLR
40.0000 mg | INTRAVENOUS | Status: DC
  Administered 2016-08-19 – 2016-08-22 (×4): 40 mg via INTRAVENOUS
  Filled 2016-08-19 (×4): qty 40

## 2016-08-19 MED ORDER — VITAMIN D 1000 UNITS PO TABS
1000.0000 [IU] | ORAL_TABLET | Freq: Every day | ORAL | Status: DC
  Administered 2016-08-20 – 2016-08-24 (×5): 1000 [IU] via ORAL
  Filled 2016-08-19 (×7): qty 1

## 2016-08-19 MED ORDER — SENNOSIDES-DOCUSATE SODIUM 8.6-50 MG PO TABS
1.0000 | ORAL_TABLET | Freq: Every evening | ORAL | Status: DC | PRN
  Administered 2016-08-23: 1 via ORAL
  Filled 2016-08-19: qty 1

## 2016-08-19 MED ORDER — VITAMIN E 180 MG (400 UNIT) PO CAPS
400.0000 [IU] | ORAL_CAPSULE | Freq: Every day | ORAL | Status: DC
  Administered 2016-08-19 – 2016-08-24 (×6): 400 [IU] via ORAL
  Filled 2016-08-19 (×7): qty 1

## 2016-08-19 MED ORDER — ADULT MULTIVITAMIN W/MINERALS CH
1.0000 | ORAL_TABLET | Freq: Every day | ORAL | Status: DC
  Administered 2016-08-20 – 2016-08-24 (×5): 1 via ORAL
  Filled 2016-08-19 (×6): qty 1

## 2016-08-19 MED ORDER — ACETAMINOPHEN 325 MG PO TABS
650.0000 mg | ORAL_TABLET | Freq: Four times a day (QID) | ORAL | Status: DC | PRN
  Administered 2016-08-19 – 2016-08-21 (×3): 650 mg via ORAL
  Filled 2016-08-19 (×3): qty 2

## 2016-08-19 MED ORDER — SODIUM CHLORIDE 0.9% FLUSH: 3.0000 mL | INTRAVENOUS | Status: DC | PRN

## 2016-08-19 MED ORDER — CALCIUM CARB-CHOLECALCIFEROL 600-800 MG-UNIT PO TABS: 1.0000 | ORAL_TABLET | Freq: Every day | ORAL | Status: DC

## 2016-08-19 MED ORDER — CEFEPIME-DEXTROSE 2 GM/50ML IV SOLR
2.0000 g | Freq: Three times a day (TID) | INTRAVENOUS | Status: DC
  Administered 2016-08-19 – 2016-08-24 (×15): 2 g via INTRAVENOUS
  Filled 2016-08-19 (×17): qty 50

## 2016-08-19 MED ORDER — AMLODIPINE BESYLATE 5 MG PO TABS
5.0000 mg | ORAL_TABLET | Freq: Every day | ORAL | Status: DC
  Administered 2016-08-19: 5 mg via ORAL
  Filled 2016-08-19: qty 1

## 2016-08-19 MED ORDER — IPRATROPIUM-ALBUTEROL 0.5-2.5 (3) MG/3ML IN SOLN
3.0000 mL | Freq: Four times a day (QID) | RESPIRATORY_TRACT | Status: DC
  Administered 2016-08-19 – 2016-08-21 (×8): 3 mL via RESPIRATORY_TRACT
  Filled 2016-08-19 (×8): qty 3

## 2016-08-19 MED ORDER — DIPHENHYDRAMINE HCL 25 MG PO CAPS
25.0000 mg | ORAL_CAPSULE | Freq: Three times a day (TID) | ORAL | Status: DC | PRN
Start: 2016-08-19 — End: 2016-08-24
  Administered 2016-08-22 – 2016-08-24 (×2): 25 mg via ORAL
  Filled 2016-08-19 (×2): qty 1

## 2016-08-19 MED ORDER — LORAZEPAM 0.5 MG PO TABS
1.0000 mg | ORAL_TABLET | Freq: Every day | ORAL | Status: DC | PRN
  Administered 2016-08-20: 1 mg via ORAL
  Filled 2016-08-19: qty 2

## 2016-08-19 MED ORDER — VITAMIN C 500 MG PO TABS
1000.0000 mg | ORAL_TABLET | Freq: Every day | ORAL | Status: DC
  Administered 2016-08-19 – 2016-08-24 (×6): 1000 mg via ORAL
  Filled 2016-08-19 (×6): qty 2

## 2016-08-19 MED ORDER — GUAIFENESIN 100 MG/5ML PO SOLN
400.0000 mg | Freq: Four times a day (QID) | ORAL | Status: DC
  Administered 2016-08-19 – 2016-08-20 (×2): 400 mg via ORAL
  Filled 2016-08-19 (×20): qty 20

## 2016-08-19 NOTE — ED Notes (Signed)
Patient taken off bipap and placed on non-rebreather per MD [facility] request

## 2016-08-19 NOTE — ED Notes (Signed)
Patient taken off nonrebreather and placed on 6L [place] per MD [facility] request

## 2016-08-19 NOTE — ED Notes (Signed)
Admitting Provider at bedside. 

## 2016-08-19 NOTE — ED Notes (Signed)
Decreased patient's O2 to 5L [place]. Will continue to monitor
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
## 2016-08-19 NOTE — NC FL2 (Deleted)
[place] LEVEL OF CARE SCREENING TOOL     IDENTIFICATION  Patient Name: Tracy Espinoza Birthdate: Sep 06, 1946 Sex: female Admission Date (Current Location): 08/19/2016  [place] and [place] Number:  Engineering geologist and Address:  [facility], [address] [place], [place] [address]      Provider Number: [id number]  Attending Physician Name and Address:  Harvie Bridge, DO  Relative Name and Phone Number:       Current Level of Care: Hospital Recommended Level of Care: [place] Prior Approval Number:    Date Approved/Denied:   PASRR Number: [id number]  Discharge Plan: SNF    Current Diagnoses: Patient Active Problem List   Diagnosis Date Noted  . Hypoxia 08/19/2016  . Healthcare-associated pneumonia 08/19/2016  . HCAP (healthcare-associated pneumonia) 08/19/2016  . Normocytic normochromic anemia 08/03/2016  . ARF (acute renal failure) ([place]) 08/03/2016  . Wound infection 08/03/2016  . Sepsis ([place]) 08/02/2016  . Spondylolisthesis at L4-L5 level 07/10/2016  . Spinal stenosis of lumbar region with radiculopathy 04/27/2016  . Anxiety 04/27/2016  . Trochanteric bursitis of right hip 10/08/2015  . Urinary tract infection 08/31/2015  . Hypertension 03/29/2015  . Hypercholesteremia 03/29/2015  . BMI 45.0-49.9, adult ([place]) 03/29/2015  . Arthralgia of hands, bilateral 03/29/2015    Orientation RESPIRATION BLADDER Height & Weight     Self, Time, Situation, Place  O2 (5L o2) Continent Weight: 297 lb (134.7 kg) Height:  5\' 2"  (157.5 cm)  BEHAVIORAL SYMPTOMS/MOOD NEUROLOGICAL BOWEL NUTRITION STATUS      Continent    AMBULATORY STATUS COMMUNICATION OF NEEDS Skin   Total Care Verbally Normal                       Personal Care Assistance Level of Assistance  Bathing, Dressing, Total care, Feeding Bathing Assistance: Maximum assistance Feeding assistance: Independent Dressing Assistance: Maximum  assistance Total Care Assistance: Maximum assistance   Functional Limitations Info    Sight Info: Adequate Hearing Info: Adequate Speech Info: Adequate    SPECIAL CARE FACTORS FREQUENCY  PT (By licensed PT), OT (By licensed OT)     PT Frequency: Up to 5X per day, 5 days per week OT Frequency: Up to 5X per day, 5 days per week            Contractures Contractures Info: Present    Additional Factors Info  Allergies Code Status Info: Full Code Allergies Info: Tape, Band-aid Plus Antibiotic Bacitracin-polymyxin B, Iodinated Diagnostic Agents, Morphine Sulfate, Vancomycin     Isolation Precautions Info: MRSA      Current Medications (08/19/2016):  This is the current hospital active medication list Current Facility-Administered Medications  Medication Dose Route Frequency Provider Last Rate Last Dose  . 0.9 %  sodium chloride infusion  250 mL Intravenous PRN Saundra Shelling, MD      . acetaminophen (TYLENOL) tablet 650 mg  650 mg Oral Q6H PRN Pavan Pyreddy, MD      . acidophilus (RISAQUAD) capsule 1 capsule  1 capsule Oral Daily Saundra Shelling, MD   1 capsule at 08/19/16 1118  . amLODipine (NORVASC) tablet 5 mg  5 mg Oral Daily Saundra Shelling, MD   5 mg at 08/19/16 1119  . ARTIFICIAL TEARS 0.1-0.3 % SOLN 1 drop  1 drop Both Eyes Daily PRN Saundra Shelling, MD      . aspirin EC tablet 81 mg  81 mg Oral Daily Pavan Pyreddy, MD   81 mg at  08/19/16 1118  . calcium carbonate (TUMS - dosed in mg elemental calcium) chewable tablet 200 mg of elemental calcium  1 tablet Oral Daily Merilyn Baba, RPH   200 mg of elemental calcium at 08/19/16 1115  . ceFEPIme (MAXIPIME) 2 GM / 30mL IVPB premix  2 g Intravenous Q8H Paulette Blanch, MD      . Cholecalciferol 1,000 Units  1,000 Units Oral Daily Pavan Pyreddy, MD      . enoxaparin (LOVENOX) injection 40 mg  40 mg Subcutaneous Q24H Saundra Shelling, MD   40 mg at 08/19/16 1127  . guaiFENesin (ROBITUSSIN) 100 MG/5ML solution 400 mg  400 mg Oral Q6H Pavan  Pyreddy, MD   400 mg at 08/19/16 1125  . hydrALAZINE (APRESOLINE) injection 10-40 mg  10-40 mg Intravenous Q4H PRN Wilhelmina Mcardle, MD      . ipratropium-albuterol (DUONEB) 0.5-2.5 (3) MG/3ML nebulizer solution 3 mL  3 mL Nebulization Q6H Pavan Pyreddy, MD   3 mL at 08/19/16 1433  . linezolid (ZYVOX) IVPB 600 mg  600 mg Intravenous Q12H Paulette Blanch, MD 300 mL/hr at 08/19/16 1505 600 mg at 08/19/16 1505  . LORazepam (ATIVAN) tablet 1 mg  1 mg Oral Daily PRN Saundra Shelling, MD      . magnesium oxide (MAG-OX) tablet 400 mg  400 mg Oral BID Saundra Shelling, MD   400 mg at 08/19/16 1117  . Melatonin CAPS 10 mg  10 mg Oral QHS Pavan Pyreddy, MD      . methocarbamol (ROBAXIN) tablet 500 mg  500 mg Oral Q6H Pavan Pyreddy, MD   500 mg at 08/19/16 1117  . multivitamin tablet 1 tablet  1 tablet Oral Daily Pavan Pyreddy, MD      . ondansetron (ZOFRAN) tablet 4 mg  4 mg Oral Q6H PRN Saundra Shelling, MD       Or  . ondansetron (ZOFRAN) injection 4 mg  4 mg Intravenous Q6H PRN Pavan Pyreddy, MD      . oxyCODONE (Oxy IR/ROXICODONE) immediate release tablet 10 mg  10 mg Oral Q8H Pavan Pyreddy, MD   10 mg at 08/19/16 1113  . oxyCODONE-acetaminophen (PERCOCET/ROXICET) 5-325 MG per tablet 2 tablet  2 tablet Oral Q4H PRN Saundra Shelling, MD      . pantoprazole (PROTONIX) injection 40 mg  40 mg Intravenous Q24H Wilhelmina Mcardle, MD   40 mg at 08/19/16 1124  . polyethylene glycol (MIRALAX / GLYCOLAX) packet 17 g  17 g Oral BID Pavan Pyreddy, MD      . polyvinyl alcohol (LIQUIFILM TEARS) 1.4 % ophthalmic solution 1 drop  1 drop Both Eyes Daily PRN Saundra Shelling, MD      . senna (SENOKOT) tablet 8.6 mg  1 tablet Oral BID Pavan Pyreddy, MD      . senna-docusate (Senokot-S) tablet 1 tablet  1 tablet Oral QHS PRN Pavan Pyreddy, MD      . sodium chloride flush (NS) 0.9 % injection 3 mL  3 mL Intravenous Q12H Pavan Pyreddy, MD      . sodium chloride flush (NS) 0.9 % injection 3 mL  3 mL Intravenous Q12H Pavan Pyreddy, MD   3 mL at  08/19/16 1000  . sodium chloride flush (NS) 0.9 % injection 3 mL  3 mL Intravenous PRN Pavan Pyreddy, MD      . traZODone (DESYREL) tablet 50 mg  50 mg Oral QHS Pavan Pyreddy, MD      . vitamin C (ASCORBIC ACID) tablet  1,000 mg  1,000 mg Oral Daily Saundra Shelling, MD   1,000 mg at 08/19/16 1117  . vitamin E capsule 400 Units  400 Units Oral Daily Saundra Shelling, MD         Discharge Medications: Please see discharge summary for a list of discharge medications.  Relevant Imaging Results:  Relevant Lab Results:   Additional Information    Zettie Pho, LCSW

## 2016-08-19 NOTE — NC FL2 (Signed)
[place] LEVEL OF CARE SCREENING TOOL     IDENTIFICATION  Patient Name: Tracy Espinoza Birthdate: Nov 29, 1945 Sex: female Admission Date (Current Location): 08/19/2016  [place] and [place] Number:  Engineering geologist and Address:  [facility], [address] [place], [place] [address]      Provider Number: [id number]  Attending Physician Name and Address:  Harvie Bridge, DO  Relative Name and Phone Number:       Current Level of Care: Hospital Recommended Level of Care: [place] Prior Approval Number:    Date Approved/Denied:   PASRR Number: [id number]  Discharge Plan: SNF    Current Diagnoses: Patient Active Problem List   Diagnosis Date Noted  . Hypoxia 08/19/2016  . Healthcare-associated pneumonia 08/19/2016  . HCAP (healthcare-associated pneumonia) 08/19/2016  . Normocytic normochromic anemia 08/03/2016  . ARF (acute renal failure) ([place]) 08/03/2016  . Wound infection 08/03/2016  . Sepsis ([place]) 08/02/2016  . Spondylolisthesis at L4-L5 level 07/10/2016  . Spinal stenosis of lumbar region with radiculopathy 04/27/2016  . Anxiety 04/27/2016  . Trochanteric bursitis of right hip 10/08/2015  . Urinary tract infection 08/31/2015  . Hypertension 03/29/2015  . Hypercholesteremia 03/29/2015  . BMI 45.0-49.9, adult ([place]) 03/29/2015  . Arthralgia of hands, bilateral 03/29/2015    Orientation RESPIRATION BLADDER Height & Weight     Self, Time, Situation, Place  O2 (5L o2) Continent Weight: 297 lb (134.7 kg) Height:  5\' 2"  (157.5 cm)  BEHAVIORAL SYMPTOMS/MOOD NEUROLOGICAL BOWEL NUTRITION STATUS      Continent    AMBULATORY STATUS COMMUNICATION OF NEEDS Skin   Total Care Verbally Normal                       Personal Care Assistance Level of Assistance  Bathing, Dressing, Total care, Feeding Bathing Assistance: Maximum assistance Feeding assistance: Independent Dressing Assistance: Maximum  assistance Total Care Assistance: Maximum assistance   Functional Limitations Info    Sight Info: Adequate Hearing Info: Adequate Speech Info: Adequate    SPECIAL CARE FACTORS FREQUENCY  PT (By licensed PT), OT (By licensed OT)     PT Frequency: Up to 5X per day, 5 days per week OT Frequency: Up to 5X per day, 5 days per week            Contractures Contractures Info: Present    Additional Factors Info  Allergies Code Status Info: Full Code Allergies Info: Tape, Band-aid Plus Antibiotic Bacitracin-polymyxin B, Iodinated Diagnostic Agents, Morphine Sulfate, Vancomycin     Isolation Precautions Info: MRSA      Current Medications (08/19/2016):  This is the current hospital active medication list Current Facility-Administered Medications  Medication Dose Route Frequency Provider Last Rate Last Dose  . 0.9 %  sodium chloride infusion  250 mL Intravenous PRN Saundra Shelling, MD      . acetaminophen (TYLENOL) tablet 650 mg  650 mg Oral Q6H PRN Pavan Pyreddy, MD      . acidophilus (RISAQUAD) capsule 1 capsule  1 capsule Oral Daily Saundra Shelling, MD   1 capsule at 08/19/16 1118  . amLODipine (NORVASC) tablet 5 mg  5 mg Oral Daily Saundra Shelling, MD   5 mg at 08/19/16 1119  . ARTIFICIAL TEARS 0.1-0.3 % SOLN 1 drop  1 drop Both Eyes Daily PRN Saundra Shelling, MD      . aspirin EC tablet 81 mg  81 mg Oral Daily Pavan Pyreddy, MD   81 mg at  08/19/16 1118  . calcium carbonate (TUMS - dosed in mg elemental calcium) chewable tablet 200 mg of elemental calcium  1 tablet Oral Daily Merilyn Baba, RPH   200 mg of elemental calcium at 08/19/16 1115  . ceFEPIme (MAXIPIME) 2 GM / 94mL IVPB premix  2 g Intravenous Q8H Paulette Blanch, MD      . Cholecalciferol 1,000 Units  1,000 Units Oral Daily Pavan Pyreddy, MD      . enoxaparin (LOVENOX) injection 40 mg  40 mg Subcutaneous Q24H Saundra Shelling, MD   40 mg at 08/19/16 1127  . guaiFENesin (ROBITUSSIN) 100 MG/5ML solution 400 mg  400 mg Oral Q6H Pavan  Pyreddy, MD   400 mg at 08/19/16 1125  . hydrALAZINE (APRESOLINE) injection 10-40 mg  10-40 mg Intravenous Q4H PRN Wilhelmina Mcardle, MD      . ipratropium-albuterol (DUONEB) 0.5-2.5 (3) MG/3ML nebulizer solution 3 mL  3 mL Nebulization Q6H Pavan Pyreddy, MD   3 mL at 08/19/16 1433  . linezolid (ZYVOX) IVPB 600 mg  600 mg Intravenous Q12H Paulette Blanch, MD 300 mL/hr at 08/19/16 1505 600 mg at 08/19/16 1505  . LORazepam (ATIVAN) tablet 1 mg  1 mg Oral Daily PRN Saundra Shelling, MD      . magnesium oxide (MAG-OX) tablet 400 mg  400 mg Oral BID Saundra Shelling, MD   400 mg at 08/19/16 1117  . Melatonin CAPS 10 mg  10 mg Oral QHS Pavan Pyreddy, MD      . methocarbamol (ROBAXIN) tablet 500 mg  500 mg Oral Q6H Pavan Pyreddy, MD   500 mg at 08/19/16 1117  . multivitamin tablet 1 tablet  1 tablet Oral Daily Pavan Pyreddy, MD      . ondansetron (ZOFRAN) tablet 4 mg  4 mg Oral Q6H PRN Saundra Shelling, MD       Or  . ondansetron (ZOFRAN) injection 4 mg  4 mg Intravenous Q6H PRN Pavan Pyreddy, MD      . oxyCODONE (Oxy IR/ROXICODONE) immediate release tablet 10 mg  10 mg Oral Q8H Pavan Pyreddy, MD   10 mg at 08/19/16 1113  . oxyCODONE-acetaminophen (PERCOCET/ROXICET) 5-325 MG per tablet 2 tablet  2 tablet Oral Q4H PRN Saundra Shelling, MD      . pantoprazole (PROTONIX) injection 40 mg  40 mg Intravenous Q24H Wilhelmina Mcardle, MD   40 mg at 08/19/16 1124  . polyethylene glycol (MIRALAX / GLYCOLAX) packet 17 g  17 g Oral BID Pavan Pyreddy, MD      . polyvinyl alcohol (LIQUIFILM TEARS) 1.4 % ophthalmic solution 1 drop  1 drop Both Eyes Daily PRN Saundra Shelling, MD      . senna (SENOKOT) tablet 8.6 mg  1 tablet Oral BID Pavan Pyreddy, MD      . senna-docusate (Senokot-S) tablet 1 tablet  1 tablet Oral QHS PRN Pavan Pyreddy, MD      . sodium chloride flush (NS) 0.9 % injection 3 mL  3 mL Intravenous Q12H Pavan Pyreddy, MD      . sodium chloride flush (NS) 0.9 % injection 3 mL  3 mL Intravenous Q12H Pavan Pyreddy, MD   3 mL at  08/19/16 1000  . sodium chloride flush (NS) 0.9 % injection 3 mL  3 mL Intravenous PRN Pavan Pyreddy, MD      . traZODone (DESYREL) tablet 50 mg  50 mg Oral QHS Pavan Pyreddy, MD      . vitamin C (ASCORBIC ACID) tablet  1,000 mg  1,000 mg Oral Daily Saundra Shelling, MD   1,000 mg at 08/19/16 1117  . vitamin E capsule 400 Units  400 Units Oral Daily Saundra Shelling, MD         Discharge Medications: Please see discharge summary for a list of discharge medications.  Relevant Imaging Results:  Relevant Lab Results:   Additional Information  SS # [id number]  Zettie Pho, LCSW

## 2016-08-19 NOTE — ED Provider Notes (Signed)
[facility] Emergency Department Provider Note   ____________________________________________   First MD Initiated Contact with Patient 08/19/16 [phone]     (approximate)  I have reviewed the triage vital signs and the nursing notes.   HISTORY  Chief Complaint Shortness of Breath    HPI Tracy Espinoza is a 70 y.o. female brought to the ED from [facility] health care with a chief complaint of shortness of breath. Patient was diagnosed earlier today with bilateral pneumonia. She is there following a hospitalization 3 weeks ago for lumbar wound MRSAstatus post back surgery. Has a PICC line in place receiving daptomycin until 09/15/2016. Reports cough productive of yellow sputum. Denies chest pain. Denies recent fever, chills, abdominal pain, nausea, vomiting, diarrhea. Denies recent travel or trauma. Nothing makes her symptoms better or worse.   Past Medical History:  Diagnosis Date  . Anxiety   . Arthritis   . Cancer (Tracy Espinoza)    BASAL CELL SKIN CANCERS REMOVED  . GERD (gastroesophageal reflux disease)   . History of hiatal hernia   . Hypertension     Patient Active Problem List   Diagnosis Date Noted  . Hypoxia 08/19/2016  . Healthcare-associated pneumonia 08/19/2016  . HCAP (healthcare-associated pneumonia) 08/19/2016  . Normocytic normochromic anemia 08/03/2016  . ARF (acute renal failure) ([place]) 08/03/2016  . Wound infection 08/03/2016  . Sepsis ([place]) 08/02/2016  . Spondylolisthesis at L4-L5 level 07/10/2016  . Spinal stenosis of lumbar region with radiculopathy 04/27/2016  . Anxiety 04/27/2016  . Trochanteric bursitis of right hip 10/08/2015  . Urinary tract infection 08/31/2015  . Hypertension 03/29/2015  . Hypercholesteremia 03/29/2015  . BMI 45.0-49.9, adult ([name]) 03/29/2015  . Arthralgia of hands, bilateral 03/29/2015    Past Surgical History:  Procedure Laterality Date  . ABDOMINAL HYSTERECTOMY    . APPENDECTOMY    . CHOLECYSTECTOMY      . FRACTURE SURGERY     BONE ON SIDE OF RIGHT FOOT  . HERNIA REPAIR     UMBILICAL X 2  . JOINT REPLACEMENT Bilateral    knees  . LUMBAR WOUND DEBRIDEMENT N/A 08/03/2016   Procedure: IRRIGATION AND DEBRIDEMENT LUMBAR WOUND;  Surgeon: Eustace Moore, MD;  Location: [facility];  Service: Neurosurgery;  Laterality: N/A;  . REPAIR OF LEFT URETER Left 40 YRS AGO  . ROTATOR CUFF REPAIR  2014  . TONSILLECTOMY    . TUBAL LIGATION      Prior to Admission medications   Medication Sig Start Date End Date Taking? Authorizing Provider  acetaminophen (TYLENOL) 325 MG tablet Take 650 mg by mouth every 6 (six) hours as needed for mild pain.   Yes Historical Provider, MD  acidophilus (RISAQUAD) CAPS capsule Take 1 capsule by mouth daily.   Yes Historical Provider, MD  amLODipine (NORVASC) 5 MG tablet Take 1 tablet (5 mg total) by mouth daily. 04/27/16  Yes Guadalupe Maple, MD  ARTIFICIAL TEARS 0.1-0.3 % SOLN Place 1 drop into both eyes daily as needed (dry eyes).   Yes Historical Provider, MD  Ascorbic Acid (VITAMIN C) 1000 MG tablet Take 1,000 mg by mouth daily.   Yes Historical Provider, MD  aspirin EC 81 MG tablet Take 81 mg by mouth daily.   Yes Historical Provider, MD  Calcium Carb-Cholecalciferol (CALCIUM 600+D) 600-800 MG-UNIT TABS Take 1 tablet by mouth daily.    Yes Historical Provider, MD  Cholecalciferol (D3-1000) 1000 units capsule Take 1,000 Units by mouth daily.   Yes Historical Provider, MD  conjugated  estrogens (PREMARIN) vaginal cream Apply a blueberry sized amount to urethra using fingertip nightly x 2 weeks then 3 times weekly at bedtime. 09/15/15  Yes Roda Shutters, FNP  CRANBERRY PO Take 1-2 capsules by mouth See admin instructions. Take 1 capsule by mouth in the morning and take 2 capsules by mouth in the evening.   Yes Historical Provider, MD  DAPTOmycin 1,000 mg in sodium chloride 0.9 % 100 mL Inject 1,000 mg into the vein daily. Till December 15th 08/08/16  Yes Bonnielee Haff, MD   guaiFENesin (ROBITUSSIN) 100 MG/5ML liquid Take 400 mg by mouth every 6 (six) hours. 08/19/16 09/01/16 Yes Historical Provider, MD  ipratropium-albuterol (DUONEB) 0.5-2.5 (3) MG/3ML SOLN Take 3 mLs by nebulization every 6 (six) hours. 08/19/16 09/01/16 Yes Historical Provider, MD  LORazepam (ATIVAN) 1 MG tablet Take 1 tablet (1 mg total) by mouth daily as needed for anxiety. 08/08/16  Yes Bonnielee Haff, MD  magnesium oxide (MAG-OX) 400 MG tablet Take 400 mg by mouth 2 (two) times daily.   Yes Historical Provider, MD  Melatonin 10 MG CAPS Take 10 mg by mouth at bedtime.    Yes Historical Provider, MD  methocarbamol (ROBAXIN) 500 MG tablet Take 1 tablet (500 mg total) by mouth every 6 (six) hours. 08/08/16  Yes Bonnielee Haff, MD  Multiple Vitamin (MULTIVITAMIN) tablet Take 1 tablet by mouth daily.   Yes Historical Provider, MD  omeprazole (PRILOSEC) 40 MG capsule Take 1 capsule (40 mg total) by mouth 2 (two) times daily. 04/27/16  Yes Guadalupe Maple, MD  oxyCODONE 10 MG TABS Take 1 tablet (10 mg total) by mouth every 8 (eight) hours. 08/08/16  Yes Bonnielee Haff, MD  oxyCODONE-acetaminophen (PERCOCET/ROXICET) 5-325 MG tablet Take 2 tablets by mouth every 4 (four) hours as needed for moderate pain. Breakthrough pain 08/08/16  Yes Bonnielee Haff, MD  polyethylene glycol (MIRALAX / GLYCOLAX) packet Take 17 g by mouth 2 (two) times daily. 08/08/16  Yes Bonnielee Haff, MD  polyvinyl alcohol (LIQUIFILM TEARS) 1.4 % ophthalmic solution Place 1 drop into both eyes daily as needed for dry eyes.    Yes Historical Provider, MD  senna (SENOKOT) 8.6 MG TABS tablet Take 1 tablet (8.6 mg total) by mouth 2 (two) times daily. 08/08/16  Yes Bonnielee Haff, MD  traZODone (DESYREL) 50 MG tablet Take 1 tablet (50 mg total) by mouth at bedtime. 08/08/16  Yes Bonnielee Haff, MD  vitamin E (VITAMIN E) 400 UNIT capsule Take 400 Units by mouth daily.   Yes Historical Provider, MD    Allergies Tape; Band-aid plus antibiotic  [bacitracin-polymyxin b]; Iodinated diagnostic agents; Morphine sulfate; and Vancomycin  Family History  Problem Relation Age of Onset  . Hypertension Mother   . Stroke Mother   . Alzheimer's disease Mother   . Cancer Father     brain  . Hypertension Father   . Stroke Father     Social History Social History  Substance Use Topics  . Smoking status: Never Smoker  . Smokeless tobacco: Never Used  . Alcohol use 1.8 oz/week    3 Glasses of wine per week    Review of Systems  Constitutional: No fever/chills. Eyes: No visual changes. ENT: No sore throat. Cardiovascular: Denies chest pain. Respiratory: Positive for cough and shortness of breath. Gastrointestinal: No abdominal pain.  No nausea, no vomiting.  No diarrhea.  No constipation. Genitourinary: Negative for dysuria. Musculoskeletal: Negative for back pain. Skin: Negative for rash. Neurological: Negative for headaches, focal weakness or  numbness.  10-point ROS otherwise negative.  ____________________________________________   PHYSICAL EXAM:  VITAL SIGNS: ED Triage Vitals  Enc Vitals Group     BP --      Pulse Rate 08/19/16 0051 88     Resp 08/19/16 0051 18     Temp 08/19/16 0051 98.1 F (36.7 C)     Temp Source 08/19/16 0051 Oral     SpO2 08/19/16 0051 (!) 67 %     Weight 08/19/16 0052 297 lb (134.7 kg)     Height 08/19/16 0052 5\' 2"  (1.575 m)     Head Circumference --      Peak Flow --      Pain Score 08/19/16 0052 8     Pain Loc --      Pain Edu? --      Excl. in [place]? --     Constitutional: Alert and oriented. Chronically ill appearing and in moderate acute distress. Eyes: Conjunctivae are normal. PERRL. EOMI. Head: Atraumatic. Nose: No congestion/rhinnorhea. Mouth/Throat: Mucous membranes are moist.  Oropharynx non-erythematous. Neck: No stridor.  Supple neck without meningismus. Cardiovascular: Normal rate, regular rhythm. Grossly normal heart sounds.  Good peripheral circulation. Respiratory:  Normal respiratory effort.  No retractions. Lungs with scattered rhonchi. Gastrointestinal: Obese. Soft and nontender. No distention. No abdominal bruits. No CVA tenderness. Musculoskeletal: No lower extremity tenderness. Baseline 2+ nonpitting edema.  No joint effusions. Neurologic:  Normal speech and language. No gross focal neurologic deficits are appreciated.  Skin:  Skin is warm, clammy and intact. No rash noted. No petechiae. Psychiatric: Mood and affect are normal. Speech and behavior are normal.  ____________________________________________   LABS (all labs ordered are listed, but only abnormal results are displayed)  Labs Reviewed  COMPREHENSIVE METABOLIC PANEL - Abnormal; Notable for the following:       Result Value   Chloride 97 (*)    Glucose, Bld 179 (*)    BUN 21 (*)    Calcium 8.2 (*)    Albumin 2.5 (*)    ALT 10 (*)    All other components within normal limits  CBC WITH DIFFERENTIAL/PLATELET - Abnormal; Notable for the following:    WBC 13.3 (*)    RBC 3.30 (*)    Hemoglobin 9.2 (*)    HCT 29.3 (*)    MCHC 31.5 (*)    RDW 15.9 (*)    Neutro Abs 8.4 (*)    Monocytes Absolute 1.0 (*)    Eosinophils Absolute 0.8 (*)    All other components within normal limits  BLOOD GAS, ARTERIAL - Abnormal; Notable for the following:    pCO2 arterial 60 (*)    Bicarbonate 38.0 (*)    Acid-Base Excess 11.7 (*)    All other components within normal limits  URINALYSIS COMPLETEWITH MICROSCOPIC ([facility] ONLY) - Abnormal; Notable for the following:    Color, Urine YELLOW (*)    APPearance CLOUDY (*)    Hgb urine dipstick 1+ (*)    Protein, ur 100 (*)    Leukocytes, UA 3+ (*)    Bacteria, UA MANY (*)    Squamous Epithelial / LPF 0-5 (*)    All other components within normal limits  CULTURE, BLOOD (ROUTINE X 2)  CULTURE, BLOOD (ROUTINE X 2)  URINE CULTURE  LACTIC ACID, PLASMA  PROCALCITONIN   ____________________________________________  EKG  ED ECG REPORT I, Oona Trammel  J, the attending physician, personally viewed and interpreted this ECG.   Date: 08/19/2016  EKG Time: 0054  Rate: 82  Rhythm: normal EKG, normal sinus rhythm  Axis: Normal  Intervals:none  ST&T Change: Nonspecific  ____________________________________________  RADIOLOGY  Portable chest x-ray (viewed by me, interpreted per Dr. Alroy Dust): Upper lobe consolidation, left greater than right, likely pneumonia.  Followup PA and lateral chest X-ray is recommended in 3-4 weeks  following trial of antibiotic therapy to ensure resolution and  exclude underlying malignancy.   ____________________________________________   PROCEDURES  Procedure(s) performed: None  Procedures  Critical Care performed: Yes, see critical care note(s)   CRITICAL CARE Performed by: Paulette Blanch   Total critical care time: 45 minutes  Critical care time was exclusive of separately billable procedures and treating other patients.  Critical care was necessary to treat or prevent imminent or life-threatening deterioration.  Critical care was time spent personally by me on the following activities: development of treatment plan with patient and/or surrogate as well as nursing, discussions with consultants, evaluation of patient's response to treatment, examination of patient, obtaining history from patient or surrogate, ordering and performing treatments and interventions, ordering and review of laboratory studies, ordering and review of radiographic studies, pulse oximetry and re-evaluation of patient's condition.  ____________________________________________   INITIAL IMPRESSION / ASSESSMENT AND PLAN / ED COURSE  Pertinent labs & imaging results that were available during my care of the patient were reviewed by me and considered in my medical decision making (see chart for details).  70 year old female presenting from SNF with shortness of breath; diagnosed with pneumonia earlier in the afternoon. Room  air saturation 66%. Patient placed immediately on BiPAP upon her arrival to the emergency department. Code sepsis initiated.  Clinical Course as of Aug 19 722  Sat Aug 19, 2016  [id number] Patient improved and appears more comfortable. Discussed with intensivist who recommends trying to wean patient from BiPAP. If she tolerates, will admit to hospitalist service.  [JS]  [id number] Patient has done well being weaned off BiPAP. Currently on 6L [place]. Discussed with hospitalist who will evaluate patient in the emergency department for admission.  [JS]    Clinical Course User Index [JS] Paulette Blanch, MD     ____________________________________________   FINAL CLINICAL IMPRESSION(S) / ED DIAGNOSES  Final diagnoses:  Shortness of breath  Hypoxia  HCAP (healthcare-associated pneumonia)  Acute cystitis without hematuria  Respiratory distress  MRSA (methicillin resistant staph aureus) culture positive      NEW MEDICATIONS STARTED DURING THIS VISIT:  New Prescriptions   No medications on file     Note:  This document was prepared using Dragon voice recognition software and may include unintentional dictation errors.    Paulette Blanch, MD 08/19/16 [phone]

## 2016-08-19 NOTE — ED Triage Notes (Signed)
Pt comes into the ED via EMS from [facility] c/o shortness of breath.  Patient was diagnosed earlier today with pneumonia with no treatment started.  Patient called EMS for herself.  Initial spo2 saturation in the 50%.  Placed on 15L non-rebreather and bumped up to 97%.  All other vital WDL.

## 2016-08-19 NOTE — Progress Notes (Signed)
Pt. C/o nausea, received PRN zofran ( see EMR for details). Pt. Stated zofran did help just not for long enough. Paged Dr. Doy Hutching who gave verbal order for dose now and to change PRN order to Q4 instead of Q6.

## 2016-08-19 NOTE — Progress Notes (Signed)
Patient ID: Tracy Espinoza, female   DOB: [birth year], 70 y.o.   MRN: [id number]   [place] [facility] [place] NAME: Tracy Espinoza    MR#:  [id number]  DATE OF BIRTH:  08-24-1946  SUBJECTIVE:  CHIEF COMPLAINT:   Chief Complaint  Patient presents with  . Shortness of Breath   Patient was seen at bedside. Chart reviewed and care assumed. She is a 70 year old female with history of anxiety disorder, arthritis, GERD, hiatal hernia, sepsis, wound infection, MRSA bacteremia who is admitted with up acute hypoxic restaurant failure secondary to bilateral pneumonia, anemia, hypertension, MRSA bacteremia and wound infection and urinary tract infection. She was admitted to stepdown started on IV cefepime and Zyvox, cultured area REVIEW OF SYSTEMS:  ROS CONSTITUTIONAL: Low grade fever, has weakness.  EYES: No blurred or double vision.  EARS, NOSE, AND THROAT: No tinnitus or ear pain.  RESPIRATORY: Has cough, shortness of breath, no wheezing or hemoptysis.  CARDIOVASCULAR: No chest pain, orthopnea, has edema.  GASTROINTESTINAL: No nausea, vomiting, diarrhea or abdominal pain.  GENITOURINARY: No dysuria, hematuria.  ENDOCRINE: No polyuria, nocturia,  HEMATOLOGY: No anemia, easy bruising or bleeding SKIN: No rash or lesion. MUSCULOSKELETAL: No joint pain or arthritis.   NEUROLOGIC: No tingling, numbness, weakness.  PSYCHIATRY: No anxiety or depression.  DRUG ALLERGIES:   Allergies  Allergen Reactions  . Tape Itching, Dermatitis and Rash  . Band-Aid Plus Antibiotic [Bacitracin-Polymyxin B] Other (See Comments)    Unknown  . Iodinated Diagnostic Agents Itching  . Morphine Sulfate Itching  . Vancomycin Rash   VITALS:  Blood pressure (!) 135/57, pulse 83, temperature 97.6 F (36.4 C), temperature source Oral, resp. rate (!) 27, height 5\' 2"  (1.575 m), weight 134.7 kg (297 lb), SpO2 92 %. PHYSICAL EXAMINATION:  Physical Exam  GENERAL:  70 y.o.-year-old obese  patient lying in the bed in mild distress.  EYES: Pupils equal, round, reactive to light and accommodation. No scleral icterus. Extraocular muscles intact.  HEENT: Head atraumatic, normocephalic. Oropharynx and nasopharynx clear.  NECK:  Supple, no jugular venous distention. No thyroid enlargement, no tenderness.  LUNGS: Decreased breath sounds bilaterally, rales heard bilaterally in both lungs. No use of accessory muscles of respiration.  CARDIOVASCULAR: S1, S2 normal. No murmurs, rubs, or gallops.  ABDOMEN: Soft, obese,nontender, nondistended. Bowel sounds present. No organomegaly or mass.  EXTREMITIES: has pedal edema,no cyanosis, or clubbing.  Trunk : has midline scar from surgery NEUROLOGIC: Cranial nerves II through XII are intact. Muscle strength 5/5 in all extremities. Sensation intact. Gait not checked.  PSYCHIATRIC: The patient is alert and oriented x 3.  SKIN: No obvious rash, lesion, or ulcer.   LABORATORY PANEL:   CBC  Recent Labs Lab 08/19/16 0055  WBC 13.3*  HGB 9.2*  HCT 29.3*  PLT 349   ------------------------------------------------------------------------------------------------------------------ Chemistries   Recent Labs Lab 08/19/16 0055  NA 135  K 3.9  CL 97*  CO2 32  GLUCOSE 179*  BUN 21*  CREATININE 0.91  CALCIUM 8.2*  AST 19  ALT 10*  ALKPHOS 56  BILITOT 0.6   RADIOLOGY:  Dg Chest Port 1 View  Result Date: 08/19/2016 CLINICAL DATA:  Dyspnea and hypoxia tonight EXAM: PORTABLE CHEST 1 VIEW COMPARISON:  08/07/2016 FINDINGS: Right upper extremity PICC line extends into the SVC. Left upper lobe consolidation, new from 08/07/2016. Patchy opacity in the right upper lobe. No large effusion. Unchanged borderline cardiomegaly. IMPRESSION: Upper lobe consolidation, left greater than right, likely pneumonia.  Followup PA and lateral chest X-ray is recommended in 3-4 weeks following trial of antibiotic therapy to ensure resolution and exclude underlying  malignancy. Electronically Signed   By: Andreas Newport M.D.   On: 08/19/2016 01:12   ASSESSMENT AND PLAN:   70 year old female patient with history of anxiety disorder, arthritis, GERD, hiatal hernia, sepsis, wound infection, MRSA bacteremia presented to the emergency room with increased shortness of breath from [place] facility.  Admitting diagnosis 1. Acute hypoxic respiratory failure secondary to HCAP - continue IV antibiotics, follow cultures, nebulizers and O2 therapy as needed. 3. Normocytic anemia - monitor CBC 4. Hypertension - continue home medications 5. History of MRSA bacteremia and wound infection - reculture wound given its appearance and consult wound care. 6. Urinary tract infection - follow cultures and continue to Abx.  All the records are reviewed and case discussed with Care Management/Social Worker. Management plans discussed with the patient, family and they are in agreement.  CODE STATUS: Full  TOTAL TIME TAKING CARE OF THIS PATIENT: 20 minutes.   More than 50% of the time was spent in counseling/coordination of care: YES  POSSIBLE D/C IN 2-3 DAYS, DEPENDING ON CLINICAL CONDITION.   Tyheem Boughner D.O. on 08/19/2016 at 12:40 PM  Between 7am to 6pm - Pager - [phone]  After 6pm go to [email or web address] - Proofreader  [facility]  Office  [phone]  CC: Primary care physician; Golden Pop, MD  Note: This dictation was prepared with Dragon dictation along with smaller phrase technology. Any transcriptional errors that result from this process are unintentional.

## 2016-08-19 NOTE — Care Management Note (Signed)
Case Management Note  Patient Details  Name: Tracy Espinoza MRN: [id number] Date of Birth: November 04, 1945  Subjective/Objective:     Tracy Tracy Espinoza was admitted from [facility]) today. Daughter is at bedside and requesting to discuss their concerns about the care that Tracy Espinoza is receiving at [facility]. The weekend CSW has already been consulted and will talk with Tracy Espinoza and her daughter today about their concerns. Case management will follow if Tracy Espinoza is discharged to home after this admission instead of returning to a SNF.                Action/Plan:   Expected Discharge Date:                  Expected Discharge Plan:     In-House Referral:     Discharge planning Services     Post Acute Care Choice:    Choice offered to:     DME Arranged:    DME Agency:     HH Arranged:    HH Agency:     Status of Service:     If discussed at [employer] of Stay Meetings, dates discussed:    Additional Comments:  Analina Filla A, RN 08/19/2016, 1:31 PM

## 2016-08-19 NOTE — H&P (Addendum)
[place] at [place] NAME: Tracy Espinoza    MR#:  [id number]  DATE OF BIRTH:  04/14/1946  DATE OF ADMISSION:  08/19/2016  PRIMARY CARE PHYSICIAN: Golden Pop, MD   REQUESTING/REFERRING PHYSICIAN:   CHIEF COMPLAINT:   Chief Complaint  Patient presents with  . Shortness of Breath    HISTORY OF PRESENT ILLNESS: Tracy Espinoza  is a 70 y.o. female with a known history of Anemia, hypertension, wound infection, hiatal hernia, hypertension, GERD, recent lumbar surgery with MRSA bacteremia presented to the emergency room with increased shortness of breath. Patient was referred from [place] as he was short of breath and her O2 sats on room air were 57%. She was put on oxygen via nonrebreather mask and was brought to the emergency room. Patient was put on BiPAP initially and stabilized. Patient had extensive hospitalization at [facility] and she had a recent lumbar surgery with wound infection. Patient underwent a debridement and irrigation and was treated with IV antibiotics and PICC line was placed and was sent to [place]. Patient currently on IV daptomycin antibiotic. She had low-grade fever and chills and was short of breath and O2 sats were dropped. She was brought to the emergency room and she was worked up and found to have pneumonia. Patient was weaned off BiPAP and put on oxygen via nasal cannula at 5 L in the emergency room. Case was discussed with the intensivist by ER physician who recommended IV cefepime and IV Zyvox antibiotics and stepdown unit. Patient awake and alert and responds to all verbal commands. Blood pressure was stable.  PAST MEDICAL HISTORY:   Past Medical History:  Diagnosis Date  . Anxiety   . Arthritis   . Cancer ([place])    BASAL CELL SKIN CANCERS REMOVED  . GERD (gastroesophageal reflux disease)   . History of hiatal hernia   . Hypertension     PAST SURGICAL HISTORY: Past  Surgical History:  Procedure Laterality Date  . ABDOMINAL HYSTERECTOMY    . APPENDECTOMY    . CHOLECYSTECTOMY    . FRACTURE SURGERY     BONE ON SIDE OF RIGHT FOOT  . HERNIA REPAIR     UMBILICAL X 2  . JOINT REPLACEMENT Bilateral    knees  . LUMBAR WOUND DEBRIDEMENT N/A 08/03/2016   Procedure: IRRIGATION AND DEBRIDEMENT LUMBAR WOUND;  Surgeon: Eustace Moore, MD;  Location: [place];  Service: Neurosurgery;  Laterality: N/A;  . REPAIR OF LEFT URETER Left 40 YRS AGO  . ROTATOR CUFF REPAIR  2014  . TONSILLECTOMY    . TUBAL LIGATION      SOCIAL HISTORY:  Social History  Substance Use Topics  . Smoking status: Never Smoker  . Smokeless tobacco: Never Used  . Alcohol use 1.8 oz/week    3 Glasses of wine per week    FAMILY HISTORY:  Family History  Problem Relation Age of Onset  . Hypertension Mother   . Stroke Mother   . Alzheimer's disease Mother   . Cancer Father     brain  . Hypertension Father   . Stroke Father     DRUG ALLERGIES:  Allergies  Allergen Reactions  . Tape Itching, Dermatitis and Rash  . Band-Aid Plus Antibiotic [Bacitracin-Polymyxin B] Other (See Comments)    Unknown  . Iodinated Diagnostic Agents Itching  . Morphine Sulfate Itching  . Vancomycin Rash    REVIEW OF SYSTEMS:   CONSTITUTIONAL: Low  grade fever, has weakness.  EYES: No blurred or double vision.  EARS, NOSE, AND THROAT: No tinnitus or ear pain.  RESPIRATORY: Has cough, shortness of breath, no wheezing or hemoptysis.  CARDIOVASCULAR: No chest pain, orthopnea, has edema.  GASTROINTESTINAL: No nausea, vomiting, diarrhea or abdominal pain.  GENITOURINARY: No dysuria, hematuria.  ENDOCRINE: No polyuria, nocturia,  HEMATOLOGY: No anemia, easy bruising or bleeding SKIN: No rash or lesion. MUSCULOSKELETAL: No joint pain or arthritis.   NEUROLOGIC: No tingling, numbness, weakness.  PSYCHIATRY: No anxiety or depression.   MEDICATIONS AT HOME:  Prior to Admission medications   Medication Sig  Start Date End Date Taking? Authorizing Provider  acetaminophen (TYLENOL) 325 MG tablet Take 650 mg by mouth every 6 (six) hours as needed for mild pain.   Yes Historical Provider, MD  acidophilus (RISAQUAD) CAPS capsule Take 1 capsule by mouth daily.   Yes Historical Provider, MD  amLODipine (NORVASC) 5 MG tablet Take 1 tablet (5 mg total) by mouth daily. 04/27/16  Yes Guadalupe Maple, MD  ARTIFICIAL TEARS 0.1-0.3 % SOLN Place 1 drop into both eyes daily as needed (dry eyes).   Yes Historical Provider, MD  Ascorbic Acid (VITAMIN C) 1000 MG tablet Take 1,000 mg by mouth daily.   Yes Historical Provider, MD  aspirin EC 81 MG tablet Take 81 mg by mouth daily.   Yes Historical Provider, MD  Calcium Carb-Cholecalciferol (CALCIUM 600+D) 600-800 MG-UNIT TABS Take 1 tablet by mouth daily.    Yes Historical Provider, MD  Cholecalciferol (D3-1000) 1000 units capsule Take 1,000 Units by mouth daily.   Yes Historical Provider, MD  conjugated estrogens (PREMARIN) vaginal cream Apply a blueberry sized amount to urethra using fingertip nightly x 2 weeks then 3 times weekly at bedtime. 09/15/15  Yes Roda Shutters, FNP  CRANBERRY PO Take 1-2 capsules by mouth See admin instructions. Take 1 capsule by mouth in the morning and take 2 capsules by mouth in the evening.   Yes Historical Provider, MD  DAPTOmycin 1,000 mg in sodium chloride 0.9 % 100 mL Inject 1,000 mg into the vein daily. Till December 15th 08/08/16  Yes Bonnielee Haff, MD  guaiFENesin (ROBITUSSIN) 100 MG/5ML liquid Take 400 mg by mouth every 6 (six) hours. 08/19/16 09/01/16 Yes Historical Provider, MD  ipratropium-albuterol (DUONEB) 0.5-2.5 (3) MG/3ML SOLN Take 3 mLs by nebulization every 6 (six) hours. 08/19/16 09/01/16 Yes Historical Provider, MD  LORazepam (ATIVAN) 1 MG tablet Take 1 tablet (1 mg total) by mouth daily as needed for anxiety. 08/08/16  Yes Bonnielee Haff, MD  magnesium oxide (MAG-OX) 400 MG tablet Take 400 mg by mouth 2 (two) times daily.    Yes Historical Provider, MD  Melatonin 10 MG CAPS Take 10 mg by mouth at bedtime.    Yes Historical Provider, MD  methocarbamol (ROBAXIN) 500 MG tablet Take 1 tablet (500 mg total) by mouth every 6 (six) hours. 08/08/16  Yes Bonnielee Haff, MD  Multiple Vitamin (MULTIVITAMIN) tablet Take 1 tablet by mouth daily.   Yes Historical Provider, MD  omeprazole (PRILOSEC) 40 MG capsule Take 1 capsule (40 mg total) by mouth 2 (two) times daily. 04/27/16  Yes Guadalupe Maple, MD  oxyCODONE 10 MG TABS Take 1 tablet (10 mg total) by mouth every 8 (eight) hours. 08/08/16  Yes Bonnielee Haff, MD  oxyCODONE-acetaminophen (PERCOCET/ROXICET) 5-325 MG tablet Take 2 tablets by mouth every 4 (four) hours as needed for moderate pain. Breakthrough pain 08/08/16  Yes Bonnielee Haff, MD  polyethylene  glycol (MIRALAX / GLYCOLAX) packet Take 17 g by mouth 2 (two) times daily. 08/08/16  Yes Bonnielee Haff, MD  polyvinyl alcohol (LIQUIFILM TEARS) 1.4 % ophthalmic solution Place 1 drop into both eyes daily as needed for dry eyes.    Yes Historical Provider, MD  senna (SENOKOT) 8.6 MG TABS tablet Take 1 tablet (8.6 mg total) by mouth 2 (two) times daily. 08/08/16  Yes Bonnielee Haff, MD  traZODone (DESYREL) 50 MG tablet Take 1 tablet (50 mg total) by mouth at bedtime. 08/08/16  Yes Bonnielee Haff, MD  vitamin E (VITAMIN E) 400 UNIT capsule Take 400 Units by mouth daily.   Yes Historical Provider, MD      PHYSICAL EXAMINATION:   VITAL SIGNS: Blood pressure 131/79, pulse 73, temperature 98.1 F (36.7 C), temperature source Oral, resp. rate (!) 22, height 5\' 2"  (1.575 m), weight 134.7 kg (297 lb), SpO2 99 %.  GENERAL:  70 y.o.-year-old obese patient lying in the bed in mild distress.  EYES: Pupils equal, round, reactive to light and accommodation. No scleral icterus. Extraocular muscles intact.  HEENT: Head atraumatic, normocephalic. Oropharynx and nasopharynx clear.  NECK:  Supple, no jugular venous distention. No thyroid  enlargement, no tenderness.  LUNGS: Decreased breath sounds bilaterally, rales heard bilaterally in both lungs. No use of accessory muscles of respiration.  CARDIOVASCULAR: S1, S2 normal. No murmurs, rubs, or gallops.  ABDOMEN: Soft, obese,nontender, nondistended. Bowel sounds present. No organomegaly or mass.  EXTREMITIES: has pedal edema,no cyanosis, or clubbing.  Trunk : has midline scar from surgery NEUROLOGIC: Cranial nerves II through XII are intact. Muscle strength 5/5 in all extremities. Sensation intact. Gait not checked.  PSYCHIATRIC: The patient is alert and oriented x 3.  SKIN: No obvious rash, lesion, or ulcer.   LABORATORY PANEL:   CBC  Recent Labs Lab 08/19/16 0055  WBC 13.3*  HGB 9.2*  HCT 29.3*  PLT 349  MCV 88.7  MCH 27.9  MCHC 31.5*  RDW 15.9*  LYMPHSABS 3.1  MONOABS 1.0*  EOSABS 0.8*  BASOSABS 0.0   ------------------------------------------------------------------------------------------------------------------  Chemistries   Recent Labs Lab 08/19/16 0055  NA 135  K 3.9  CL 97*  CO2 32  GLUCOSE 179*  BUN 21*  CREATININE 0.91  CALCIUM 8.2*  AST 19  ALT 10*  ALKPHOS 56  BILITOT 0.6   ------------------------------------------------------------------------------------------------------------------ estimated creatinine clearance is 75.1 mL/min (by C-G formula based on SCr of 0.91 mg/dL). ------------------------------------------------------------------------------------------------------------------ No results for input(s): TSH, T4TOTAL, T3FREE, THYROIDAB in the last 72 hours.  Invalid input(s): FREET3   Coagulation profile No results for input(s): INR, PROTIME in the last 168 hours. ------------------------------------------------------------------------------------------------------------------- No results for input(s): DDIMER in the last 72  hours. -------------------------------------------------------------------------------------------------------------------  Cardiac Enzymes No results for input(s): CKMB, TROPONINI, MYOGLOBIN in the last 168 hours.  Invalid input(s): CK ------------------------------------------------------------------------------------------------------------------ Invalid input(s): POCBNP  ---------------------------------------------------------------------------------------------------------------  Urinalysis    Component Value Date/Time   COLORURINE YELLOW (A) 08/19/2016 0055   APPEARANCEUR CLOUDY (A) 08/19/2016 0055   APPEARANCEUR Clear 07/03/2016 1137   LABSPEC 1.018 08/19/2016 0055   PHURINE 5.0 08/19/2016 0055   GLUCOSEU NEGATIVE 08/19/2016 0055   HGBUR 1+ (A) 08/19/2016 0055   BILIRUBINUR NEGATIVE 08/19/2016 0055   BILIRUBINUR Negative 07/03/2016 [place] 08/19/2016 0055   PROTEINUR 100 (A) 08/19/2016 0055   NITRITE NEGATIVE 08/19/2016 0055   LEUKOCYTESUR 3+ (A) 08/19/2016 0055   LEUKOCYTESUR Negative 07/03/2016 1137     RADIOLOGY: Dg Chest Port 1 View  Result Date: 08/19/2016  CLINICAL DATA:  Dyspnea and hypoxia tonight EXAM: PORTABLE CHEST 1 VIEW COMPARISON:  08/07/2016 FINDINGS: Right upper extremity PICC line extends into the SVC. Left upper lobe consolidation, new from 08/07/2016. Patchy opacity in the right upper lobe. No large effusion. Unchanged borderline cardiomegaly. IMPRESSION: Upper lobe consolidation, left greater than right, likely pneumonia. Followup PA and lateral chest X-ray is recommended in 3-4 weeks following trial of antibiotic therapy to ensure resolution and exclude underlying malignancy. Electronically Signed   By: Andreas Newport M.D.   On: 08/19/2016 01:12    EKG: Orders placed or performed during the hospital encounter of 08/19/16  . EKG 12-Lead  . EKG 12-Lead  . ED EKG 12-Lead  . ED EKG 12-Lead    IMPRESSION AND PLAN: 71 year old  female patient with history of anxiety disorder, arthritis, GERD, hiatal hernia, sepsis, wound infection, MRSA bacteremia presented to the emergency room with increased shortness of breath from [place] facility.  Admitting diagnosis 1. Acute hypoxic respiratory failure 2. Bilateral pneumonia 3. Normocytic anemia 4. Hypertension 5. History of MRSA bacteremia and wound infection 6. Urinary tract infection  Treatment plan Admit patient to stepdown unit Start patient on IV cefepime and IV Zyvox antibiotics Follow-up cultures Follow-up hemoglobin and hematocrit Follow-up renal function DVT prophylaxis subcutaneous Lovenox 40 MG daily Resume home medications Oxygen via nasal cannula at 5 L. Supportive care  All the records are reviewed and case discussed with ED provider. Management plans discussed with the patient, family and they are in agreement.  CODE STATUS:FULL Code Status History    Date Active Date Inactive Code Status Order ID Comments User Context   08/03/2016  7:44 PM 08/08/2016  6:43 PM Full Code [id number]  Eustace Moore, MD Inpatient   08/03/2016 12:46 AM 08/03/2016  7:44 PM Full Code [id number]  Rise Patience, MD ED   07/10/2016  3:36 PM 07/15/2016  2:05 PM Full Code [id number]  Kristeen Miss, MD Inpatient    Advance Directive Documentation   Flowsheet Row Most Recent Value  Type of Advance Directive  Healthcare Power of Attorney, Living will  Pre-existing out of facility DNR order (yellow form or pink MOST form)  No data  "MOST" Form in Place?  No data       TOTAL CRITICAL CARE TIME TAKING CARE OF THIS PATIENT: 58 minutes.    Saundra Shelling M.D on 08/19/2016 at 4:24 AM  Between 7am to 6pm - Pager - [phone]  After 6pm go to [email or web address] - password [email or web address] [facility]  Office  [phone]  CC: Primary care physician; Golden Pop, MD

## 2016-08-19 NOTE — Care Management (Signed)
Placed CSW referral.  Patient from [facility].  Placed on bipapp in the ED and admitted to icu stepdown for closer monitoring. She was weaned from bipap in the ED and upon arrival to icu was on 5 liters.  she has since been weaned to 3 liters

## 2016-08-19 NOTE — Progress Notes (Addendum)
Pharmacy Antibiotic Note  Tracy Espinoza is a 70 y.o. female admitted on 08/19/2016 with pneumonia.  Pharmacy has been consulted for cefepime dosing.  Plan: Cefepime 2 grams q 8 hours ordered.  Height: 5\' 2"  (157.5 cm) Weight: 297 lb (134.7 kg) IBW/kg (Calculated) : 50.1  Temp (24hrs), Avg:98.1 F (36.7 C), Min:98.1 F (36.7 C), Max:98.1 F (36.7 C)   Recent Labs Lab 08/19/16 0055  WBC 13.3*  CREATININE 0.91  LATICACIDVEN 1.5    Estimated Creatinine Clearance: 75.1 mL/min (by C-G formula based on SCr of 0.91 mg/dL).    Allergies  Allergen Reactions  . Tape Itching, Dermatitis and Rash  . Band-Aid Plus Antibiotic [Bacitracin-Polymyxin B] Other (See Comments)    Unknown  . Iodinated Diagnostic Agents Itching  . Morphine Sulfate Itching  . Vancomycin Rash    Antimicrobials this admission: cefepime 11/18  >>   Zyvox 11/18 >>   Dose adjustments this admission:   Microbiology results: 11/18 BCx: pending 11/18 UCx: pending  11/2 MRSA PCR: (+)    11/18 UA: pending 11/18 CXR upper lobe consolidation L>R  Thank you for allowing pharmacy to be a part of this patient's care.  Jamaury Gumz S 08/19/2016 1:50 AM

## 2016-08-20 DIAGNOSIS — A419 Sepsis, unspecified organism: Principal | ICD-10-CM

## 2016-08-20 DIAGNOSIS — J9601 Acute respiratory failure with hypoxia: Secondary | ICD-10-CM

## 2016-08-20 DIAGNOSIS — R6520 Severe sepsis without septic shock: Secondary | ICD-10-CM

## 2016-08-20 DIAGNOSIS — J9600 Acute respiratory failure, unspecified whether with hypoxia or hypercapnia: Secondary | ICD-10-CM

## 2016-08-20 LAB — BASIC METABOLIC PANEL
Anion gap: 5 (ref 5–15)
BUN: 15 mg/dL (ref 6–20)
CHLORIDE: 92 mmol/L — AB (ref 101–111)
CO2: 39 mmol/L — AB (ref 22–32)
CREATININE: 0.73 mg/dL (ref 0.44–1.00)
Calcium: 8.2 mg/dL — ABNORMAL LOW (ref 8.9–10.3)
GFR calc non Af Amer: >60 mL/min (ref >60)
Glucose, Bld: 134 mg/dL — ABNORMAL HIGH (ref 65–99)
POTASSIUM: 3.7 mmol/L (ref 3.5–5.1)
SODIUM: 136 mmol/L (ref 135–145)

## 2016-08-20 LAB — CBC
HEMATOCRIT: 27 % — AB (ref 35.0–47.0)
HEMOGLOBIN: 8.9 g/dL — AB (ref 12.0–16.0)
MCH: 28.4 pg (ref 26.0–34.0)
MCHC: 32.9 g/dL (ref 32.0–36.0)
MCV: 86.2 fL (ref 80.0–100.0)
Platelets: 306 10*3/uL (ref 150–440)
RBC: 3.13 MIL/uL — AB (ref 3.80–5.20)
RDW: 15.9 % — ABNORMAL HIGH (ref 11.5–14.5)
WBC: 11 10*3/uL (ref 3.6–11.0)

## 2016-08-20 LAB — PROCALCITONIN: Procalcitonin: 0.26 ng/mL

## 2016-08-20 MED ORDER — HYDROMORPHONE HCL 1 MG/ML IJ SOLN
1.0000 mg | INTRAMUSCULAR | Status: DC | PRN
  Administered 2016-08-22 – 2016-08-23 (×2): 1 mg via INTRAVENOUS
  Filled 2016-08-20 (×2): qty 1

## 2016-08-20 MED ORDER — MORPHINE SULFATE (PF) 4 MG/ML IV SOLN
2.0000 mg | Freq: Once | INTRAVENOUS | Status: AC
  Administered 2016-08-20: 2 mg via INTRAVENOUS
  Filled 2016-08-20: qty 1

## 2016-08-20 MED ORDER — ALPRAZOLAM 0.25 MG PO TABS
0.2500 mg | ORAL_TABLET | Freq: Three times a day (TID) | ORAL | Status: DC | PRN
Start: 2016-08-20 — End: 2016-08-24
  Administered 2016-08-20 – 2016-08-24 (×5): 0.25 mg via ORAL
  Filled 2016-08-20 (×5): qty 1

## 2016-08-20 MED ORDER — ORAL CARE MOUTH RINSE
15.0000 mL | Freq: Two times a day (BID) | OROMUCOSAL | Status: DC
  Administered 2016-08-20 – 2016-08-23 (×4): 15 mL via OROMUCOSAL

## 2016-08-20 MED ORDER — OXYCODONE HCL 5 MG PO TABS
10.0000 mg | ORAL_TABLET | Freq: Four times a day (QID) | ORAL | Status: DC | PRN
  Administered 2016-08-20 – 2016-08-21 (×2): 10 mg via ORAL
  Filled 2016-08-20 (×2): qty 2

## 2016-08-20 MED ORDER — SODIUM CHLORIDE 0.9% FLUSH
10.0000 mL | Freq: Two times a day (BID) | INTRAVENOUS | Status: DC
  Administered 2016-08-20 – 2016-08-23 (×8): 10 mL

## 2016-08-20 MED ORDER — POTASSIUM CHLORIDE CRYS ER 20 MEQ PO TBCR
40.0000 meq | EXTENDED_RELEASE_TABLET | Freq: Once | ORAL | Status: AC
  Administered 2016-08-20: 40 meq via ORAL
  Filled 2016-08-20: qty 2

## 2016-08-20 MED ORDER — SODIUM CHLORIDE 0.9% FLUSH: 10.0000 mL | INTRAVENOUS | Status: DC | PRN

## 2016-08-20 NOTE — Progress Notes (Signed)
Asked to put patient on bipap by NP.  Patient resting quietly on NRB mask.  Placed patient on bipap 10/5 50%. Patient immediately woke up and started moaning and complaining that she cant wear bipap... Its too much.  Placed her back on NRB. Reported findings to RN and NP

## 2016-08-20 NOTE — Evaluation (Signed)
Physical Therapy Evaluation Patient Details Name: Tracy Espinoza MRN: [id number] DOB: 23-Nov-1945 Today's Date: 08/20/2016   History of Present Illness  HISTORY OF PRESENT ILLNESS: Tracy Espinoza  is a 70 y.o. female with a known history of Anemia, hypertension, wound infection, hiatal hernia, hypertension, GERD, recent lumbar surgery with MRSA bacteremia presented to the emergency room with increased shortness of breath. Patient was referred from [place] as he was short of breath and her O2 sats on room air were 57%. She was put on oxygen via nonrebreather mask and was brought to the emergency room. Patient was put on BiPAP initially and stabilized. Patient had extensive hospitalization at [facility] and she had a recent lumbar surgery with wound infection. Patient underwent a debridement and irrigation and was treated with IV antibiotics and PICC line was placed and was sent to [place]. Patient currently on IV daptomycin antibiotic. She had low-grade fever and chills and was short of breath and O2 sats were dropped. She was brought to the emergency room and she was worked up and found to have pneumonia. Patient was weaned off BiPAP and put on oxygen via nasal cannula at 5 L in the emergency room. Case was discussed with the intensivist by ER physician who recommended IV cefepime and IV Zyvox antibiotics and stepdown unit. Patient awake and alert and responds to all verbal commands.  Clinical Impression  Pt admitted with above diagnosis. Pt currently with functional limitations due to the deficits listed below (see PT Problem List).  PT evaluation is limited on this date. Patient reports that she was advised by her neurosurgeon that she must wear a back brace for all transfers and ambulation for 12 weeks after surgery. Her back brace is currently at [facility]. Daughter contacted and agrees to pick a brace and bring to hospital. Patient  able to perform bed exercises with therapist on this date. She demonstrates profound lower extremity weakness due recent surgery with multiple hospitalizations. She will need an out of bed mobility assessment once her back brace is available. Anticipate that she will need skilled nursing placement at discharge for continued physical therapy. Patient would be a fair candidate for inpatient rehabilitation however screening order indicates that no screening order to be placed in patients with morbid obesity. Will attempt transfers and ambulation once back brace is available. Pt will benefit from skilled PT services to address deficits in strength, balance, and mobility in order to return to full function at home.     Follow Up Recommendations SNF;Other (comment) (IP rehab but would not be considered due to morbid obesity)    Equipment Recommendations  Other (comment) (TBD further at facility. Pt has multiple walkers at home)    Recommendations for Other Services Rehab consult     Precautions / Restrictions Precautions Precautions: Back;Fall Precaution Comments: reviewed spinal precautions. NO bending lifting, or twisting. Pt reports she was told by her surgeon that she is required to use the back brace for 12 weeks post-operatively. Daughter to bring brace to hospital tomorrow Required Braces or Orthoses: Spinal Brace Restrictions Weight Bearing Restrictions: No      Mobility  Bed Mobility               General bed mobility comments: Deferred all bed mobility, transfers, and ambulation due to absence of back brace. Pt reports that neurosurgeon order is for back brace with all transfers and ambulation. Daughter will bring brace  Transfers  Ambulation/Gait                Stairs            Wheelchair Mobility    Modified Rankin (Stroke Patients Only)       Balance               Standing balance comment: Unable to assess at this  time                             Pertinent Vitals/Pain Pain Assessment: No/denies pain    Home Living Family/patient expects to be discharged to:: Private residence Living Arrangements: Alone Available Help at Discharge: Family Type of Home: House Home Access: Ramped entrance     Home Layout: One level Home Equipment: Environmental consultant - 2 wheels;Walker - 4 wheels;Shower seat - built in;Hand held shower head;Grab bars - tub/shower;Grab bars - toilet;Adaptive equipment;Wheelchair - manual (Also three-wheeled walker)      Prior Function Level of Independence: Independent with assistive device(s)         Comments: Household ambulator with single point cane. No falls in the last 12 months. Independent with ADLs and IADLs. Online shopping and daughter picks up for groceries     Hand Dominance   Dominant Hand: Right    Extremity/Trunk Assessment   Upper Extremity Assessment: Generalized weakness (Unable to fully assess due to lifting precautions)           Lower Extremity Assessment: Generalized weakness (Observed during bed exercises)         Communication   Communication: No difficulties  Cognition Arousal/Alertness: Awake/alert Behavior During Therapy: WFL for tasks assessed/performed Overall Cognitive Status: Within Functional Limits for tasks assessed                      General Comments      Exercises General Exercises - Lower Extremity Ankle Circles/Pumps: Strengthening;Both;15 reps;Supine Quad Sets: Strengthening;Both;10 reps;Supine Gluteal Sets: Strengthening;Both;10 reps;Supine Short Arc Quad: Strengthening;Both;10 reps;Supine Heel Slides: Strengthening;Both;10 reps;Supine Hip ABduction/ADduction: Strengthening;Both;10 reps;Supine Straight Leg Raises: Strengthening;Both;10 reps;Supine   Assessment/Plan    PT Assessment Patient needs continued PT services  PT Problem List Decreased strength;Decreased activity tolerance;Decreased  balance;Decreased mobility;Cardiopulmonary status limiting activity;Obesity          PT Treatment Interventions Gait training;Functional mobility training;Therapeutic activities;Therapeutic exercise;Balance training;Neuromuscular re-education;Patient/family education;DME instruction    PT Goals (Current goals can be found in the Care Plan section)  Acute Rehab PT Goals Patient Stated Goal: Return to living independently at home. Decrease pain and improve function PT Goal Formulation: With patient Time For Goal Achievement: 09/03/16 Potential to Achieve Goals: Good    Frequency 7X/week   Barriers to discharge Decreased caregiver support Pt lives alone. Prior barrier for SNF placement was specific IV antiobiotic required    Co-evaluation               End of Session Equipment Utilized During Treatment: Oxygen Activity Tolerance: Other (comment);Treatment limited secondary to medical complications (Comment) (Need back brace for mobility) Patient left: in bed;with call bell/phone within reach Nurse Communication: Mobility status;Precautions;Patient requests pain meds         Time: 1345-1425 PT Time Calculation (min) (ACUTE ONLY): 40 min   Charges:   PT Evaluation $PT Eval High Complexity: 1 Procedure PT Treatments $Therapeutic Exercise: 8-22 mins   PT G Codes:       Phillips Grout PT, DPT  Chundra Sauerwein 08/20/2016, 2:41 PM

## 2016-08-20 NOTE — Progress Notes (Signed)
Good day. Diuresis ed 2liters. Now on 4 L nasal canula. Coughing and deep breathing as well as using her Incentive spirometry hourly. Ate a good lunch. Not interested in dinner. Ordered her jello and icy.

## 2016-08-20 NOTE — Progress Notes (Signed)
PT PROFILE: 33 F who underwent L spine surgery 07/10/16, then I&D of lumbar wound 11/02. DC'd to SNF/Rehab with PICC on Linezolid for MRSA bacteremia. Admitted by hospitalists 11/18 with dyspnea, hypoxemia, fever, dx of sepsis - unclear source with urinary tract, resp tract and wound as possible sources.  MAJOR EVENTS/TEST RESULTS: 11/18 Admission to SDU, Hospitalists service 11/18 PCCM Consultation for hypoxic respiratory failure  INDWELLING DEVICES:: RUE PICC 11/06 >>   MICRO DATA: MRSA PCR 11/18 >> NEG Urine 11/18 >> E coli >>  Wound 11/18 >>  Blood 11/18 >>    ANTIMICROBIALS:  Linezolid 11/06 >>  Cefepime 11/18 >>    SUBJ: Moaning in apparent pain. Reports LBP. No overt respiratory distress but on NRB mask. Cognition intact  OBJ:  Vitals:   08/20/16 0900 08/20/16 1000 08/20/16 1100 08/20/16 1200  BP: (!) 145/70 (!) 149/65 (!) 128/50 112/61  Pulse: 86 87 81 78  Resp: 17 (!) 31 (!) 29 (!) 26  Temp:    98.1 F (36.7 C)  TempSrc:      SpO2: 92% 96% 94% 94%  Weight:      Height:       Obese, moaning, no overt respiratory distress HEENT WNL JVP cannot be assessed Few rhonchi, no wheezes Reg, no M Obese, soft, NT Ext warm, no edema  BMP Latest Ref Rng & Units 08/20/2016 08/19/2016 08/19/2016  Glucose 65 - 99 mg/dL 134(H) 128(H) 179(H)  BUN 6 - 20 mg/dL 15 15 21(H)  Creatinine 0.44 - 1.00 mg/dL 0.73 0.75 0.91  BUN/Creat Ratio 12 - 28 - - -  Sodium 135 - 145 mmol/L 136 135 135  Potassium 3.5 - 5.1 mmol/L 3.7 4.0 3.9  Chloride 101 - 111 mmol/L 92(L) 94(L) 97(L)  CO2 22 - 32 mmol/L 39(H) 36(H) 32  Calcium 8.9 - 10.3 mg/dL 8.2(L) 8.2(L) 8.2(L)   CBC Latest Ref Rng & Units 08/20/2016 08/19/2016 08/19/2016  WBC 3.6 - 11.0 K/uL 11.0 11.7(H) 13.3(H)  Hemoglobin 12.0 - 16.0 g/dL 8.9(L) 8.7(L) 9.2(L)  Hematocrit 35.0 - 47.0 % 27.0(L) 27.0(L) 29.3(L)  Platelets 150 - 440 K/uL 306 313 349    CXR: Patchy bilateral airspace opacities  IMPRESSION: 1) Acte hypoxic  respiratory failure 2) Bilateral pulmonary opacities - suspect edema vs ALI. Possible multifocal PNA 3) Hypertension, controlled 4) Chronic anemia without acute blood loss 5) Severe sepsis with multiple possible sources - UTI, PNA, wound, line  PLAN/REC: 1) Supplemental O2 to maintain SpO2 > 90% 2) Empiric nebulized bronchodilators 3) Limit volume. Diuresis as tolerated by BP and renal function 4) Monitor BMET intermittently 5) Correct electrolytes as indicated 6) Monitor temp, WBC count 7) Micro and abx as above 8) PCT algorithm   Merton Border, MD PCCM service Mobile [phone] Pager [phone] 08/20/2016

## 2016-08-20 NOTE — Progress Notes (Signed)
Patient ID: NAYLEE MISE, female   DOB: March 16, 1946, 69 y.o.   MRN: [id number]   [place] [facility] [place] NAME: Tracy Espinoza    MR#:  [id number]  DATE OF BIRTH:  1946/04/03  SUBJECTIVE:  CHIEF COMPLAINT:   Chief Complaint  Patient presents with  . Shortness of Breath   She is a 70 year old female with history of anxiety disorder, arthritis, GERD, hiatal hernia, sepsis, wound infection, MRSA bacteremia who is admitted with up acute hypoxic Respiratory failure secondary to bilateral pneumonia, anemia, hypertension, MRSA bacteremia and wound infection and urinary tract infection. Patient has a complicated history significant for spinal stenosis surgery in the spring of 0000000 which is complicated by MRSA wound infection and prolonged stay in rehabilitation. She was admitted to stepdown started on IV cefepime and Zyvox, with blood, urine and wound cultures pending.  Patient was seen at bedside. Noted to have fever overnight as well as hypoxia requiring BiPAP which the patient can tolerate. Today she states that she is feeling somewhat improved with respect to her shortness of breath however she does have dyspnea on exertion as well as continued low back pain which is not well-controlled. REVIEW OF SYSTEMS:  ROS  CONSTITUTIONAL: Positivefever, hasweakness.  EYES: No blurred or double vision.  EARS, NOSE, AND THROAT: No tinnitus or ear pain.  RESPIRATORY: [place], shortness of breath, , dyspnea on exertion no wheezing or hemoptysis.  CARDIOVASCULAR: No chest pain, orthopnea, has edema.  GASTROINTESTINAL: No nausea, vomiting, diarrhea or abdominal pain.  GENITOURINARY: No dysuria, hematuria.  ENDOCRINE: No polyuria, nocturia,  HEMATOLOGY: No anemia, easy bruising or bleeding SKIN: No rash or lesion. MUSCULOSKELETAL: No joint pain or arthritis. Positive for low back pain NEUROLOGIC: No tingling, numbness, weakness.  PSYCHIATRY: No anxiety or depression.    DRUG ALLERGIES:   Allergies  Allergen Reactions  . Tape Itching, Dermatitis and Rash  . Band-Aid Plus Antibiotic [Bacitracin-Polymyxin B] Other (See Comments)    Unknown  . Iodinated Diagnostic Agents Itching  . Morphine Sulfate Itching  . Vancomycin Rash   VITALS:  Blood pressure (!) 128/50, pulse 81, temperature 97.8 F (36.6 C), resp. rate (!) 29, height 5\' 2"  (1.575 m), weight 134.7 kg (297 lb), SpO2 94 %. PHYSICAL EXAMINATION:  Physical Exam  GENERAL: 70 y.o.-year-old obesepatient lying in the bed in milddistress. Weak. EYES: Pupils equal, round, reactive to light and accommodation. No scleral icterus. Extraocular muscles intact.  HEENT: Head atraumatic, normocephalic. Oropharynx and nasopharynx clear.  NECK: Supple, no jugular venous distention. No thyroid enlargement, no tenderness.  LUNGS: Decreasedbreath sounds bilaterally, rales heard bilaterally in both lungs.No use of accessory muscles of respiration.  CARDIOVASCULAR: S1, S2 normal. No murmurs, rubs, or gallops.  ABDOMEN: Soft, obese,nontender, nondistended. Bowel sounds present. No organomegaly or mass.  EXTREMITIES: haspedal edema,nocyanosis, or clubbing.  Trunk : Back reveals midline surgical scar. NEUROLOGIC: Cranial nerves II through XII are intact. Muscle strength 5/5 in all extremities. Sensation intact. Gait not checked.  PSYCHIATRIC: The patient is alert and oriented x 3.  SKIN: No obvious rash, lesion, or ulcer.  LABORATORY PANEL:   CBC  Recent Labs Lab 08/20/16 0506  WBC 11.0  HGB 8.9*  HCT 27.0*  PLT 306   ------------------------------------------------------------------------------------------------------------------ Chemistries   Recent Labs Lab 08/19/16 0055 08/19/16 2231 08/20/16 0506  NA 135 135 136  K 3.9 4.0 3.7  CL 97* 94* 92*  CO2 32 36* 39*  GLUCOSE 179* 128* 134*  BUN 21* 15  15  CREATININE 0.91 0.75 0.73  CALCIUM 8.2* 8.2* 8.2*  MG  --  1.7  --   AST 19  --   --    ALT 10*  --   --   ALKPHOS 56  --   --   BILITOT 0.6  --   --    RADIOLOGY:  Dg Chest Port 1 View  Result Date: 08/19/2016 CLINICAL DATA:  Acute onset of respiratory failure. Worsening dyspnea and hypoxia. Initial encounter. EXAM: PORTABLE CHEST 1 VIEW COMPARISON:  Chest radiograph performed earlier today at 1:00 a.m. FINDINGS: A right PICC is noted ending about the mid SVC. The lungs are well-aerated. Patchy bilateral airspace opacities raise concern for multifocal pneumonia, worsened from the prior study. There is no evidence of pleural effusion or pneumothorax. The cardiomediastinal silhouette is borderline enlarged. No acute osseous abnormalities are seen. IMPRESSION: Patchy bilateral airspace opacities raise concern for multifocal pneumonia, worsened from the prior study. Borderline cardiomegaly. Followup PA and lateral chest X-ray is recommended in 3-4 weeks following trial of antibiotic therapy to ensure resolution and exclude underlying malignancy. Electronically Signed   By: Garald Balding M.D.   On: 08/19/2016 22:55   ASSESSMENT AND PLAN:   70 year old female patient with history of anxiety disorder, arthritis, GERD, hiatal hernia, sepsis, wound infection, MRSA bacteremia presented to the emergency room with increased shortness of breath from [facility] facility.  Admitting diagnosis 1.Acute hypoxic respiratory failure secondary to HCAP - continue IV antibiotics, follow cultures, nebulizers and O2 therapy as needed. 3.Normocytic anemia - monitor CBC 4.Hypertension - continue home medications 5.History of MRSA bacteremia and wound infection - reculture wound given its appearance and consult wound care. 6.Urinary tract infection - follow cultures and continue to Abx. 7. Chronic low back pain-I have adjusted the patient's pain medication regimen. I have also requested physical therapy evaluation for bed mobility and discharge planning.  All the records are reviewed and  case discussed with Care Management/Social Worker. Management plans discussed with the patient, family and they are in agreement.  CODE STATUS: Full  TOTAL TIME TAKING CARE OF THIS PATIENT: 35 minutes.   More than 50% of the time was spent in counseling/coordination of care: YES  POSSIBLE D/C IN 2-3 DAYS, DEPENDING ON CLINICAL CONDITION.   Anjelita Sheahan D.O. on 08/20/2016 at 11:52 AM  Between 7am to 6pm - Pager - [phone]  After 6pm go to [email or web address] - Proofreader  [facility]  Office  [phone]  CC: Primary care physician; Golden Pop, MD  Note: This dictation was prepared with Dragon dictation along with smaller phrase technology. Any transcriptional errors that result from this process are unintentional.

## 2016-08-20 NOTE — Progress Notes (Signed)
[place] Progress Note Patient Name: Tracy Espinoza DOB: 1946-08-08 MRN: [id number]   Date of Service  08/20/2016  HPI/Events of Note  Can recheck on patient with acute respiratory failure. Rate 22 & saturation 97%. Heart rate 70. BP 117/54 with mean arterial pressure 70. Lites soft. No staff at bedside. Patient sleeping on nonrebreather mask. ABG reviewed. Mildly increased work of breathing.   eICU Interventions  Continue close monitoring given potential risk of intubation.      Intervention Category Major Interventions: Respiratory failure - evaluation and management  Tera Partridge 08/20/2016, 2:14 AM

## 2016-08-20 NOTE — Consult Note (Signed)
PULMONARY / CRITICAL CARE MEDICINE   Name: Tracy Espinoza MRN: [id number] DOB: [birth year]    ADMISSION DATE:  08/19/2016   CONSULTATION DATE:  08/19/16  REFERRING MD: Dr. Jannifer Franklin  CHIEF COMPLAINT:  Acute respiratory distress  HISTORY OF PRESENT ILLNESS:  This is a 70 year old Caucasian female with a history of hypertension, GERD, recent lumbar surgery with subsequent wound infection with MRSA, anemia and high at ischemia. We will presents with increased shortness of breath. Patient is currently a resident of [facility] and nursing home staff noted that her SPO2 was 57% on room air. She was placed on a nonrebreather and in brought to the emergency room by EMS. She had been recently diagnosed with pneumonia at the nursing home but treatment had not been initiated. Upon arrival in the ED her SPO2 was 50% and improved with nonrebreather to 97%. Her chest x-ray showed multifocal pneumonia and she was started on continuous BiPAP, broad-spectrum antibiotics. She was admitted with sepsis and multifocal pneumonia and was being treated by the hospitalist team. On 08/19/2016 at about 9 PM, patient became acutely hypoxic requiring a nonrebreather mask again. A repeat chest x-ray showed suggested worsening pneumonia and her ABG showed moderate hypercarbia. Due to worsening respiratory status. PCM was consulted for further management  She was recently hospitalized multiple times at [facility] for a recording lumbar surgical wound infection with MRSA. She underwent a debridement as well as an irrigation with the PICC line placement and I home IV antibiotics. Prior to admission, she was on daptomycin. She continued to have chills and low-grade fevers. She describes her hospital coursesince has surgery as complicated.    PAST MEDICAL HISTORY :  She  has a past medical history of Anxiety; Arthritis; Cancer [facility]); GERD (gastroesophageal reflux disease); History of hiatal hernia; and  Hypertension.  PAST SURGICAL HISTORY: She  has a past surgical history that includes Cholecystectomy; Abdominal hysterectomy; Joint replacement (Bilateral); Rotator cuff repair (2014); Tonsillectomy; Fracture surgery; Appendectomy; Hernia repair; Tubal ligation; REPAIR OF LEFT URETER (Left, 40 YRS AGO); and Lumbar wound debridement (N/A, 08/03/2016).  Allergies  Allergen Reactions  . Tape Itching, Dermatitis and Rash  . Band-Aid Plus Antibiotic [Bacitracin-Polymyxin B] Other (See Comments)    Unknown  . Iodinated Diagnostic Agents Itching  . Morphine Sulfate Itching  . Vancomycin Rash    No current facility-administered medications on file prior to encounter.    Current Outpatient Prescriptions on File Prior to Encounter  Medication Sig  . acetaminophen (TYLENOL) 325 MG tablet Take 650 mg by mouth every 6 (six) hours as needed for mild pain.  Marland Kitchen acidophilus (RISAQUAD) CAPS capsule Take 1 capsule by mouth daily.  Marland Kitchen amLODipine (NORVASC) 5 MG tablet Take 1 tablet (5 mg total) by mouth daily.  . ARTIFICIAL TEARS 0.1-0.3 % SOLN Place 1 drop into both eyes daily as needed (dry eyes).  . Ascorbic Acid (VITAMIN C) 1000 MG tablet Take 1,000 mg by mouth daily.  Marland Kitchen aspirin EC 81 MG tablet Take 81 mg by mouth daily.  . Calcium Carb-Cholecalciferol (CALCIUM 600+D) 600-800 MG-UNIT TABS Take 1 tablet by mouth daily.   . Cholecalciferol (D3-1000) 1000 units capsule Take 1,000 Units by mouth daily.  Marland Kitchen conjugated estrogens (PREMARIN) vaginal cream Apply a blueberry sized amount to urethra using fingertip nightly x 2 weeks then 3 times weekly at bedtime.  Marland Kitchen CRANBERRY PO Take 1-2 capsules by mouth See admin instructions. Take 1 capsule by mouth in the morning and take 2 capsules  by mouth in the evening.  Marland Kitchen DAPTOmycin 1,000 mg in sodium chloride 0.9 % 100 mL Inject 1,000 mg into the vein daily. Till December 15th  . LORazepam (ATIVAN) 1 MG tablet Take 1 tablet (1 mg total) by mouth daily as needed for anxiety.   . magnesium oxide (MAG-OX) 400 MG tablet Take 400 mg by mouth 2 (two) times daily.  . Melatonin 10 MG CAPS Take 10 mg by mouth at bedtime.   . methocarbamol (ROBAXIN) 500 MG tablet Take 1 tablet (500 mg total) by mouth every 6 (six) hours.  . Multiple Vitamin (MULTIVITAMIN) tablet Take 1 tablet by mouth daily.  Marland Kitchen omeprazole (PRILOSEC) 40 MG capsule Take 1 capsule (40 mg total) by mouth 2 (two) times daily.  Marland Kitchen oxyCODONE 10 MG TABS Take 1 tablet (10 mg total) by mouth every 8 (eight) hours.  Marland Kitchen oxyCODONE-acetaminophen (PERCOCET/ROXICET) 5-325 MG tablet Take 2 tablets by mouth every 4 (four) hours as needed for moderate pain. Breakthrough pain  . polyethylene glycol (MIRALAX / GLYCOLAX) packet Take 17 g by mouth 2 (two) times daily.  . polyvinyl alcohol (LIQUIFILM TEARS) 1.4 % ophthalmic solution Place 1 drop into both eyes daily as needed for dry eyes.   Marland Kitchen senna (SENOKOT) 8.6 MG TABS tablet Take 1 tablet (8.6 mg total) by mouth 2 (two) times daily.  . traZODone (DESYREL) 50 MG tablet Take 1 tablet (50 mg total) by mouth at bedtime.  . vitamin E (VITAMIN E) 400 UNIT capsule Take 400 Units by mouth daily.    FAMILY HISTORY:  Her indicated that her mother is deceased. She indicated that her father is deceased.    SOCIAL HISTORY: She  reports that she has never smoked. She has never used smokeless tobacco. She reports that she drinks about 1.8 oz of alcohol per week . She reports that she does not use drugs.  REVIEW OF SYSTEMS:   Constitutional: Positive for fever and chills.  HENT: Negative for congestion and rhinorrhea.  Eyes: Negative for redness and visual disturbance.  Respiratory:positive for shortness of breath and wheezing.  Cardiovascular: Negative for chest pain and palpitations.  Gastrointestinal: Positive  for nausea , but no vomiting, abdominal pain and loose stools Genitourinary: Negative for dysuria and urgency.  Endocrine: Denies polyuria, polyphagia and heat  intolerance Musculoskeletal: reports lumbagia, myalgias and arthralgias.  Skin: Negative for pallor but positive to back wound.  Neurological: Negative for dizziness and headaches   SUBJECTIVE:   VITAL SIGNS: BP (!) 127/30   Pulse 90   Temp 97.8 F (36.6 C)   Resp 20   Ht 5\' 2"  (1.575 m)   Wt 297 lb (134.7 kg)   SpO2 94%   BMI 54.32 kg/m   HEMODYNAMICS:    VENTILATOR SETTINGS: FiO2 (%):  [100 %] 100 %  INTAKE / OUTPUT: I/O last 3 completed shifts: In: 42 [P.O.:120; IV Piggyback:1050] Out: [id number] [Urine:4050]  PHYSICAL EXAMINATION: General: Chronically ill-looking, in moderate respiratory distress Neuro: Alert to person and place, able to move all extremities, follows commands, speech is normal HEENT: Normocephalic and nontraumatic, ear and nasal passages patent, PERRLA, conjunctiva pale, neck is supple with good range of motion, no palpable lymphadenopathy, trachea is midline, no JVD Cardiovascular: Rate and rhythm regular, sinus or sinus rhythm on monitor, S1, S2 audible, no murmur, regurg or gallop Lungs: Respirations are labored, auscultation reveals diminished breath sounds in the bases bilaterally, positive rhonchi and Rales in all anterior and posterior lung fields, as well as wheezing Abdomen:  Obese, nondistended, normal bowel sounds, palpation reveals no organomegaly Musculoskeletal: Normal range of motion in upper and lower extremities, no deformities Skin:  No rash, lumbosacral with intact dressing  LABS:  BMET  Recent Labs Lab 08/19/16 0055 08/19/16 2231 08/20/16 0506  NA 135 135 136  K 3.9 4.0 3.7  CL 97* 94* 92*  CO2 32 36* 39*  BUN 21* 15 15  CREATININE 0.91 0.75 0.73  GLUCOSE 179* 128* 134*    Electrolytes  Recent Labs Lab 08/19/16 0055 08/19/16 2231 08/20/16 0506  CALCIUM 8.2* 8.2* 8.2*  MG  --  1.7  --   PHOS  --  2.8  --     CBC  Recent Labs Lab 08/19/16 0055 08/19/16 2231 08/20/16 0506  WBC 13.3* 11.7* 11.0  HGB 9.2*  8.7* 8.9*  HCT 29.3* 27.0* 27.0*  PLT 349 313 306    Coag's No results for input(s): APTT, INR in the last 168 hours.  Sepsis Markers  Recent Labs Lab 08/19/16 0055 08/19/16 2302 08/20/16 0506  LATICACIDVEN 1.5 0.9  --   PROCALCITON 0.31  --  0.26    ABG  Recent Labs Lab 08/19/16 0139 08/19/16 2307  PHART 7.41 7.43  PCO2ART 60* 59*  PO2ART 85 113*    Liver Enzymes  Recent Labs Lab 08/19/16 0055  AST 19  ALT 10*  ALKPHOS 56  BILITOT 0.6  ALBUMIN 2.5*    Cardiac Enzymes No results for input(s): TROPONINI, PROBNP in the last 168 hours.  Glucose No results for input(s): GLUCAP in the last 168 hours.  Imaging Dg Chest Port 1 View  Result Date: 08/19/2016 CLINICAL DATA:  Acute onset of respiratory failure. Worsening dyspnea and hypoxia. Initial encounter. EXAM: PORTABLE CHEST 1 VIEW COMPARISON:  Chest radiograph performed earlier today at 1:00 a.m. FINDINGS: A right PICC is noted ending about the mid SVC. The lungs are well-aerated. Patchy bilateral airspace opacities raise concern for multifocal pneumonia, worsened from the prior study. There is no evidence of pleural effusion or pneumothorax. The cardiomediastinal silhouette is borderline enlarged. No acute osseous abnormalities are seen. IMPRESSION: Patchy bilateral airspace opacities raise concern for multifocal pneumonia, worsened from the prior study. Borderline cardiomegaly. Followup PA and lateral chest X-ray is recommended in 3-4 weeks following trial of antibiotic therapy to ensure resolution and exclude underlying malignancy. Electronically Signed   By: Garald Balding M.D.   On: 08/19/2016 22:55    STUDIES:  Last 2-D echo was 08/06/2016 showed an EF of 60-65%. Mildly calcified aortic made. Trial and tricuspid valves, mildly  dilated left atrium  CULTURES: Wound on back: 08/19/2016: Predominantly moderate gram-negative rods  MRSA screen negative Urine culture with gram-negative rods with a colony count  greater than 100,000  Blood cultures: Pending   ANTIBIOTICS: Cefepime 08/19/2016 Linezolid 08/19/2016  SIGNIFICANT EVENTS:  08/19/2016: ED with shortness of breath, diagnosed with multifocal pneumonia, UTI, and sepsis; admitted to stepdown unit. 08/19/2016 night-developed acute respiratory failure requiring BiPAP but declined BiPAP  LINES/TUBES:  Peripheral IVs Right PICC line  DISCUSSION:  70 year old Caucasian female presenting with acute height. Hypercarbic respiratory failure secondary to sepsis, multifocal pneumonia and UTI  ASSESSMENT / PLAN:  PULMONARY A:  Acute hypercarbic respiratory failure secondary to sepsis Multifocal pneumonia-worsening pneumonia on x-ray. P:   IV antibiotics Nebulized bronchodilators incentive spirometry and flutter valve Monitor respiratory status closely. Patient is at high risk for intubation  CARDIOVASCULAR A:  Hypertension P:  Hemodynamics monitoring per ICU protocol Resume home blood pressure medications  RENAL A:   No acute issues P:   Trend creatinine and renally dose medications  GASTROINTESTINAL A:   Nausea without vomiting P:   When necessary Zofran.  HEMATOLOGIC A:   Anemia-chronic disease versus blood P:  Trend H&H and transfuse if hemoglobin less than 7. Lovenox for VTE prophylaxis  INFECTIOUS A:   Sepsis secondary to multifocal pneumonia and UTI Lumbosacral wound infection status post laminectomy History of MRSA bacteremia and MRSA wound infection Urinary tract infection P:   Antibiotics as above.  follow-up cultures trend pro-calcitonin level and titrate antibiotics appropriately  ENDOCRINE A:   Mild hyperglycemia without a diagnosis of diabetes   P:   Monitor blood glucose with BMP  NEUROLOGIC A:   Acute metabolic encephalopathy;  mentation improving P:   RASS goal: n/a monitor mental status closely  FAMILY  - Updates:   - Inter-disciplinary family meet or Palliative Care meeting due  by: day 7  Total time = 60 minutes   Is deceased. Discussed with Dr. Jamal Collin and Dr. Wallis Bamberg at [facility] ANP-BC Pulmonary and Critical Care Medicine [facility] Pager [phone] or [phone] 08/20/2016, 10:29 AM    Merton Border, MD PCCM service Mobile [phone] Pager [phone] 08/20/2016

## 2016-08-20 NOTE — Progress Notes (Addendum)
Pharmacy Antibiotic Note  Tracy Espinoza is a 70 y.o. female admitted on 08/19/2016 with pneumonia.  Pharmacy has been consulted for cefepime dosing.  Plan: Continue Cefepime 2 g IV q8 hours. Height: 5\' 2"  (157.5 cm) Weight: 297 lb (134.7 kg) IBW/kg (Calculated) : 50.1  Temp (24hrs), Avg:99.3 F (37.4 C), Min:97.8 F (36.6 C), Max:101.6 F (38.7 C)   Recent Labs Lab 08/19/16 0055 08/19/16 2231 08/19/16 2302 08/20/16 0506  WBC 13.3* 11.7*  --  11.0  CREATININE 0.91 0.75  --  0.73  LATICACIDVEN 1.5  --  0.9  --     Estimated Creatinine Clearance: 85.4 mL/min (by C-G formula based on SCr of 0.73 mg/dL).    Allergies  Allergen Reactions  . Tape Itching, Dermatitis and Rash  . Band-Aid Plus Antibiotic [Bacitracin-Polymyxin B] Other (See Comments)    Unknown  . Iodinated Diagnostic Agents Itching  . Morphine Sulfate Itching  . Vancomycin Rash    Antimicrobials this admission: cefepime 11/18  >>   Zyvox 11/18 >>   Dose adjustments this admission:   Microbiology results: 11/18 BCx: pending 11/18 UCx: pending  11/2 MRSA PCR: (+)    11/18 UA: pending 11/18 CXR upper lobe consolidation L>R  Thank you for allowing pharmacy to be a part of this patient's care.  Adeana Grilliot D 08/20/2016 10:13 AM

## 2016-08-21 ENCOUNTER — Inpatient Hospital Stay: Payer: Medicare Other

## 2016-08-21 DIAGNOSIS — R0902 Hypoxemia: Secondary | ICD-10-CM

## 2016-08-21 LAB — CBC
HEMATOCRIT: 27.7 % — AB (ref 35.0–47.0)
HEMOGLOBIN: 9.1 g/dL — AB (ref 12.0–16.0)
MCH: 28.5 pg (ref 26.0–34.0)
MCHC: 33 g/dL (ref 32.0–36.0)
MCV: 86.4 fL (ref 80.0–100.0)
Platelets: 303 10*3/uL (ref 150–440)
RBC: 3.21 MIL/uL — AB (ref 3.80–5.20)
RDW: 15.9 % — ABNORMAL HIGH (ref 11.5–14.5)
WBC: 10.7 10*3/uL (ref 3.6–11.0)

## 2016-08-21 LAB — BASIC METABOLIC PANEL
ANION GAP: 6 (ref 5–15)
BUN: 16 mg/dL (ref 6–20)
CHLORIDE: 87 mmol/L — AB (ref 101–111)
CO2: 43 mmol/L — AB (ref 22–32)
CREATININE: 0.78 mg/dL (ref 0.44–1.00)
Calcium: 8.4 mg/dL — ABNORMAL LOW (ref 8.9–10.3)
GFR calc non Af Amer: >60 mL/min (ref >60)
Glucose, Bld: 145 mg/dL — ABNORMAL HIGH (ref 65–99)
POTASSIUM: 3.4 mmol/L — AB (ref 3.5–5.1)
Sodium: 136 mmol/L (ref 135–145)

## 2016-08-21 LAB — URINE CULTURE

## 2016-08-21 LAB — GLUCOSE, CAPILLARY: Glucose-Capillary: 109 mg/dL — ABNORMAL HIGH (ref 65–99)

## 2016-08-21 LAB — PROCALCITONIN: Procalcitonin: 0.31 ng/mL

## 2016-08-21 MED ORDER — ENOXAPARIN SODIUM 40 MG/0.4ML ~~LOC~~ SOLN
40.0000 mg | Freq: Two times a day (BID) | SUBCUTANEOUS | Status: DC
  Administered 2016-08-21 – 2016-08-24 (×7): 40 mg via SUBCUTANEOUS
  Filled 2016-08-21 (×6): qty 0.4

## 2016-08-21 MED ORDER — SODIUM CHLORIDE 0.9 % IV SOLN
1500.0000 mg | Freq: Once | INTRAVENOUS | Status: AC
  Administered 2016-08-21: 1500 mg via INTRAVENOUS
  Filled 2016-08-21: qty 1500

## 2016-08-21 MED ORDER — MIDAZOLAM HCL 2 MG/2ML IJ SOLN: 2.0000 mg | Freq: Once | INTRAMUSCULAR | Status: DC

## 2016-08-21 MED ORDER — VANCOMYCIN HCL 10 G IV SOLR
1500.0000 mg | INTRAVENOUS | Status: DC
  Administered 2016-08-22: 1500 mg via INTRAVENOUS
  Filled 2016-08-21 (×2): qty 1500

## 2016-08-21 MED ORDER — POTASSIUM CHLORIDE CRYS ER 20 MEQ PO TBCR
40.0000 meq | EXTENDED_RELEASE_TABLET | Freq: Once | ORAL | Status: AC
  Administered 2016-08-21: 40 meq via ORAL
  Filled 2016-08-21: qty 2

## 2016-08-21 MED ORDER — IPRATROPIUM-ALBUTEROL 0.5-2.5 (3) MG/3ML IN SOLN
3.0000 mL | Freq: Two times a day (BID) | RESPIRATORY_TRACT | Status: DC
  Administered 2016-08-21 – 2016-08-24 (×6): 3 mL via RESPIRATORY_TRACT
  Filled 2016-08-21 (×6): qty 3

## 2016-08-21 NOTE — Progress Notes (Signed)
Patient ID: IDABEL STADER, female   DOB: 05/10/1946, 70 y.o.   MRN: [id number]   [facility] [place] NAME: Taylin Bellizzi    MR#:  [id number]  DATE OF BIRTH:  1945-10-25  SUBJECTIVE:  CHIEF COMPLAINT:   Chief Complaint  Patient presents with  . Shortness of Breath   She is a 70 year old female with history of anxiety disorder, arthritis, GERD, hiatal hernia, sepsis, wound infection, MRSA bacteremia who is admitted with up acute hypoxic Respiratory failure secondary to bilateral pneumonia, anemia, hypertension, MRSA bacteremia and wound infection and urinary tract infection. Patient has a complicated history significant for spinal stenosis surgery in the spring of 0000000 which is complicated by MRSA wound infection and prolonged stay in rehabilitation. She was admitted to stepdown started on IV cefepime and Zyvox, with blood, urine and wound cultures pending.  Shortness of breath improved. On 3 L oxygen. Has chronic back pain which is to same. Afebrile.  REVIEW OF SYSTEMS:  ROS  CONSTITUTIONAL:  hasweakness.  EYES: No blurred or double vision.  EARS, NOSE, AND THROAT: No tinnitus or ear pain.  RESPIRATORY: [place], shortness of breath, , dyspnea on exertion no wheezing or hemoptysis.  CARDIOVASCULAR: No chest pain, orthopnea, has edema.  GASTROINTESTINAL: No nausea, vomiting, diarrhea or abdominal pain.  GENITOURINARY: No dysuria, hematuria.  ENDOCRINE: No polyuria, nocturia,  HEMATOLOGY: No anemia, easy bruising or bleeding SKIN: No rash or lesion. MUSCULOSKELETAL: No joint pain or arthritis. Positive for low back pain NEUROLOGIC: No tingling, numbness, weakness.  PSYCHIATRY: No anxiety or depression.   DRUG ALLERGIES:   Allergies  Allergen Reactions  . Tape Itching, Dermatitis and Rash  . Band-Aid Plus Antibiotic [Bacitracin-Polymyxin B] Other (See Comments)    Unknown  . Iodinated Diagnostic Agents Itching  . Morphine Sulfate  Itching  . Vancomycin Rash   VITALS:  Blood pressure (!) 118/53, pulse 72, temperature 100 F (37.8 C), temperature source Axillary, resp. rate 18, height 5\' 2"  (1.575 m), weight 134.7 kg (297 lb), SpO2 98 %. PHYSICAL EXAMINATION:  Physical Exam  GENERAL: 70 y.o.-year-old obesepatient lying in the bed. Obese EYES: Pupils equal, round, reactive to light and accommodation. No scleral icterus. Extraocular muscles intact.  HEENT: Head atraumatic, normocephalic. Oropharynx and nasopharynx clear.  NECK: Supple, no jugular venous distention. No thyroid enlargement, no tenderness.  LUNGS: Decreasedbreath sounds bilaterally, rales heard bilaterally in both lungs.No use of accessory muscles of respiration.  CARDIOVASCULAR: S1, S2 normal. No murmurs, rubs, or gallops.  ABDOMEN: Soft, obese,nontender, nondistended. Bowel sounds present. No organomegaly or mass.  EXTREMITIES: haspedal edema,nocyanosis, or clubbing.  Trunk : Back reveals midline surgical scar. NEUROLOGIC: Cranial nerves II through XII are intact. Moves all 4 extremities symmetrically PSYCHIATRIC: The patient is alert and oriented x 3.  LABORATORY PANEL:   CBC  Recent Labs Lab 08/21/16 0533  WBC 10.7  HGB 9.1*  HCT 27.7*  PLT 303   ------------------------------------------------------------------------------------------------------------------ Chemistries   Recent Labs Lab 08/19/16 0055 08/19/16 2231  08/21/16 0533  NA 135 135  < > 136  K 3.9 4.0  < > 3.4*  CL 97* 94*  < > 87*  CO2 32 36*  < > 43*  GLUCOSE 179* 128*  < > 145*  BUN 21* 15  < > 16  CREATININE 0.91 0.75  < > 0.78  CALCIUM 8.2* 8.2*  < > 8.4*  MG  --  1.7  --   --   AST 19  --   --   --  ALT 10*  --   --   --   ALKPHOS 56  --   --   --   BILITOT 0.6  --   --   --   < > = values in this interval not displayed. RADIOLOGY:  Dg Chest Port 1 View  Result Date: 08/21/2016 CLINICAL DATA:  Respiratory failure. EXAM: PORTABLE CHEST 1 VIEW  COMPARISON:  08/19/2016 . FINDINGS: Right PICC line stable position. Stable cardiomegaly. Interim slight improvement of patchy bilateral pulmonary infiltrates. No pleural effusion or pneumothorax. IMPRESSION: 1. Right PICC line in stable position. 2.  Interim partial clearing of bilateral pulmonary infiltrates. Electronically Signed   By: Marcello Moores  Register   On: 08/21/2016 07:47   ASSESSMENT AND PLAN:   70 year old female patient with history of anxiety disorder, arthritis, GERD, hiatal hernia, sepsis, wound infection, MRSA bacteremia presented to the emergency room with increased shortness of breath from [place] facility  1.Acute hypoxic respiratory failure secondary to HCAP  continue IV antibiotics, follow cultures, nebulizers and O2 therapy as needed.  3.Normocytic anemia - monitor CBC  4.Hypertension - continue home medications  5.History of MRSA bacteremia and wound infection - reculture wound given its appearance and consult wound care. We'll request infectious disease consult due to multiple sources of infection and patient being on IV antibiotics as outpatient.  6.Urinary tract infection - follow cultures and continue to Abx.  7. Chronic low back pain On pain medications  All the records are reviewed and case discussed with Care Management/Social Worker. Management plans discussed with the patient, family and they are in agreement.  CODE STATUS: Full  TOTAL TIME TAKING CARE OF THIS PATIENT: 35 minutes.   More than 50% of the time was spent in counseling/coordination of care: YES  POSSIBLE D/C IN 2-3 DAYS, DEPENDING ON CLINICAL CONDITION.   Hillary Bow R M.D. on 08/21/2016 at 9:59 AM  Between 7am to 6pm - Pager - [phone]  After 6pm go to [email or web address] - Proofreader  [facility]  Office  [phone]  CC: Primary care physician; Golden Pop, MD  Note: This dictation was prepared with Dragon dictation  along with smaller phrase technology. Any transcriptional errors that result from this process are unintentional.

## 2016-08-21 NOTE — Progress Notes (Signed)
Dr. Juanell Fairly informed of SVT run while patient up with PT.

## 2016-08-21 NOTE — Progress Notes (Signed)
PT. C/o pain in L arm from BP cuff, Ms.Tracy Mane NP stated VS Q2h was appropriate.  Will continue to monitor pt. Closely.

## 2016-08-21 NOTE — Clinical Social Work Note (Signed)
It will be of utmost importance to know as far in advanced as possible what IV antibiotic patient will need to discharge. Previously she was discharged on Cubicin which severely limited patient's options for SNF. If no additional beds are found, patient will need to return to [facility]. Shela Leff MSW,LCSW [phone]

## 2016-08-21 NOTE — Progress Notes (Signed)
PT PROFILE: 72 F who underwent L spine surgery 07/10/16, then I&D of lumbar wound 11/02. DC'd to SNF/Rehab with PICC on Linezolid for MRSA bacteremia. Admitted by hospitalists 11/18 with dyspnea, hypoxemia, fever, dx of sepsis - unclear source with urinary tract, resp tract and wound as possible sources.  MAJOR EVENTS/TEST RESULTS: 11/18 Admission to SDU, Hospitalists service 11/18 PCCM Consultation for hypoxic respiratory failure  INDWELLING DEVICES:: RUE PICC 11/06 >>   MICRO DATA: MRSA PCR 11/18 >> NEG Urine 11/18 >> E coli >>  Wound 11/18 >>  Blood 11/18 >>    ANTIMICROBIALS:  Linezolid 11/06 >>  Cefepime 11/18 >>    SUBJECTIVE:  Reports LBP. No overt respiratory distress. Off non-rebreather and doing well on 4L . Cognition intact  OBJECTIVE:  Vitals:   08/21/16 0300 08/21/16 0400 08/21/16 0500 08/21/16 0600  BP:  (!) 118/39  (!) 118/53  Pulse: 73 71 65 72  Resp: (!) 23 (!) 21 18   Temp:      TempSrc:      SpO2: 95% 95% 95% 98%  Weight:      Height:       PHYSICAL EXAMINATION: General: Chronically ill-looking, no respiratory distress Neuro: AAO X 3, able to move all extremities, follows commands, speech is normal HEENT: Normocephalic and nontraumatic, ear and nasal passages patent, PERRLA, conjunctiva pale, neck is supple with good range of motion, no palpable lymphadenopathy, trachea is midline, no JVD Cardiovascular: Rate and rhythm regular, sinus or sinus rhythm on monitor, S1, S2 audible, no murmur, regurg or gallop Lungs: Respirations are unlabored, auscultation reveals bilateral breath sounds, no rhonchi and Rales; mild wheezing in left lung fields Abdomen: Obese, nondistended, normal bowel sounds, palpation reveals no organomegaly Musculoskeletal: Normal range of motion in upper and lower extremities, no deformities Skin:  No rash, lumbosacral with intact dressing   BMP Latest Ref Rng & Units 08/21/2016 08/20/2016 08/19/2016  Glucose 65 - 99 mg/dL  145(H) 134(H) 128(H)  BUN 6 - 20 mg/dL 16 15 15   Creatinine 0.44 - 1.00 mg/dL 0.78 0.73 0.75  BUN/Creat Ratio 12 - 28 - - -  Sodium 135 - 145 mmol/L 136 136 135  Potassium 3.5 - 5.1 mmol/L 3.4(L) 3.7 4.0  Chloride 101 - 111 mmol/L 87(L) 92(L) 94(L)  CO2 22 - 32 mmol/L 43(H) 39(H) 36(H)  Calcium 8.9 - 10.3 mg/dL 8.4(L) 8.2(L) 8.2(L)   CBC Latest Ref Rng & Units 08/21/2016 08/20/2016 08/19/2016  WBC 3.6 - 11.0 K/uL 10.7 11.0 11.7(H)  Hemoglobin 12.0 - 16.0 g/dL 9.1(L) 8.9(L) 8.7(L)  Hematocrit 35.0 - 47.0 % 27.7(L) 27.0(L) 27.0(L)  Platelets 150 - 440 K/uL 303 306 313    CXR: Patchy bilateral airspace opacities  IMPRESSION: 1) Acte hypoxic respiratory failure-improved 2) Bilateral pulmonary opacities - suspect edema vs ALI. Possible multifocal PNA 3) Hypertension, controlled 4) Chronic anemia without acute blood loss 5) Severe sepsis with multiple possible sources - UTI, PNA, wound, line  PLAN/REC: 1) Supplemental O2 to maintain SpO2 > 90% 2) Empiric nebulized bronchodilators 3) Limit volume. Diuresis as tolerated by BP and renal function 4) Monitor BMET intermittently 5) Correct electrolytes as indicated 6) Monitor temp, WBC count 7) Micro and abx as above 8) PCT algorithm Case discussed with Dr. Ashby Dawes  Transfer to floor. Spoke with Dr. Naaman Plummer at 7:20am.   Tracy Espinoza. [facility] ANP-BC Pulmonary and Critical Care Medicine [facility] Pager [phone] or [phone] 08/21/2016  Pt seen and examined with NP, agree with assessment and plan. Pt appears  to be doing better, awake and alert this am, lung CTA bilaterally.   Marda Stalker, M.D.

## 2016-08-21 NOTE — Consult Note (Signed)
[facility] Nurse wound consult note Reason for Consult:Infected surgical site to spine, recent debridement.  Nonhealing.  Albumin is 2.5 and currently being treated for pneumonia.  Not able to move easily in bed.  Wound type:Infectious, surgical nonhealing.  Pressure Ulcer POA: N/A Measurement: 8 cm linear incision, open at distal end, 0.3 cm and scabbed lesion proximal to that.  Wound [id number] (proximal)and open and pink to distal Drainage (amount, consistency, odor) Minimal serosanguinous   No odor.  Periwound:Erythema Dressing procedure/placement/frequency:Cleanse wound to spine with NS and pat gently dry.  Apply Iodoform packing strip to open (distal) end of incision.  Cover incision line with silicone border foam dressing.  Change packing strip daily and change foam every 3 days and PRN soilage.  Will not follow at this time.  Please re-consult if needed.  Domenic Moras RN BSN [place] Pager [phone]

## 2016-08-21 NOTE — Progress Notes (Signed)
Physical Therapy Treatment Patient Details Name: Tracy Espinoza MRN: [id number] DOB: [birth year] Today's Date: 08/21/2016    History of Present Illness HISTORY OF PRESENT ILLNESS: Tracy Espinoza  is a 70 y.o. female with a known history of Anemia, hypertension, wound infection, hiatal hernia, hypertension, GERD, recent lumbar surgery with MRSA bacteremia presented to the emergency room with increased shortness of breath. Patient was referred from [place] as he was short of breath and her O2 sats on room air were 57%. She was put on oxygen via nonrebreather mask and was brought to the emergency room. Patient was put on BiPAP initially and stabilized. Patient had extensive hospitalization at [facility] and she had a recent lumbar surgery with wound infection. Patient underwent a debridement and irrigation and was treated with IV antibiotics and PICC line was placed and was sent to [facility]. Patient currently on IV daptomycin antibiotic. She had low-grade fever and chills and was short of breath and O2 sats were dropped. She was brought to the emergency room and she was worked up and found to have pneumonia. Patient was weaned off BiPAP and put on oxygen via nasal cannula at 5 L in the emergency room. Case was discussed with the intensivist by ER physician who recommended IV cefepime and IV Zyvox antibiotics and stepdown unit. Patient awake and alert and responds to all verbal commands.    PT Comments    Pt is making good progress towards goals with ability to perform OOB mobility to recliner. Back brace applied for all mobility. Pt mainly limited secondary to O2 sats, quickly dropping to 84% with exertion. Heavy reminders for pursed lip breathing during there-ex and mobility. Used BRW for all OOB mobility. Extra assistance needed for lines/leads. Pt appears motivated to perform therapy.  Follow Up Recommendations  SNF     Equipment Recommendations        Recommendations for Other Services       Precautions / Restrictions Precautions Precautions: Back;Fall Precaution Booklet Issued: No Precaution Comments: reviewed spinal precautions. NO bending lifting, or twisting. Pt reports she was told by her surgeon that she is required to use the back brace for 12 weeks post-operatively. Has brace in room Required Braces or Orthoses: Spinal Brace Spinal Brace: Lumbar corset;Applied in sitting position Restrictions Weight Bearing Restrictions: No    Mobility  Bed Mobility Overal bed mobility: Needs Assistance Bed Mobility: Supine to Sit     Supine to sit: Min assist     General bed mobility comments: assist for bed mobility including initiating movement towards R side of bed. Once seated at EOB, pt able to don brace with min assist. Safe technique performed  Transfers Overall transfer level: Needs assistance Equipment used: Rolling walker (2 wheeled) Transfers: Sit to/from Stand Sit to Stand: Min assist         General transfer comment: Pt stood from elevated bed with min assist and cues for sequencing. Once standing, pt able to stand with upright posture. Safe technique performed. BRW used. All mobility performed on 2L of O2 with sats ranging from 86-94%. Cues for pursed lip breathing  Ambulation/Gait Ambulation/Gait assistance: Min assist Ambulation Distance (Feet): 3 Feet Assistive device: Rolling walker (2 wheeled) Gait Pattern/deviations: Step-to pattern     General Gait Details: ambulated to recliner with slow step to gait pattern. O2 quickly desats with limited exertion, limiting further ambulation distance. No LOB noted.   Stairs  Wheelchair Mobility    Modified Rankin (Stroke Patients Only)       Balance                                    Cognition Arousal/Alertness: Awake/alert Behavior During Therapy: WFL for tasks assessed/performed Overall Cognitive Status: Within Functional  Limits for tasks assessed                      Exercises Other Exercises Other Exercises: Pt performed 12 reps of B LE ther-ex including ankle circles, quad sets, SLRs, LAQ, and hip add squeezes. All ther-ex performed x cga and cues for correct technique.    General Comments        Pertinent Vitals/Pain Pain Assessment: No/denies pain    Home Living                      Prior Function            PT Goals (current goals can now be found in the care plan section) Acute Rehab PT Goals Patient Stated Goal: Return to living independently at home. Decrease pain and improve function PT Goal Formulation: With patient Time For Goal Achievement: 09/03/16 Potential to Achieve Goals: Good Progress towards PT goals: Progressing toward goals    Frequency    7X/week      PT Plan Current plan remains appropriate    Co-evaluation             End of Session Equipment Utilized During Treatment: Gait belt;Oxygen;Back brace Activity Tolerance: Patient tolerated treatment well Patient left: in chair;with call bell/phone within reach     Time: 0946-1030 PT Time Calculation (min) (ACUTE ONLY): 44 min  Charges:  $Gait Training: 8-22 mins $Therapeutic Exercise: 23-37 mins                    G Codes:      Terre Hanneman September 19, 2016, 11:02 AM  Greggory Stallion, PT, DPT [phone]

## 2016-08-21 NOTE — Clinical Social Work Note (Signed)
Informed by RN CM that patient will not discharge on Cubicin for IV antibiotics. Patient has transferred out of ICU to 1A and report was given to [place] on 1A.  Shela Leff MSW,LCSW [phone]

## 2016-08-21 NOTE — Progress Notes (Addendum)
Pharmacy Antibiotic Note  Tracy Espinoza is a 70 y.o. female admitted on 08/19/2016 with pneumonia.  Pharmacy has been consulted for cefepime dosing. Vancomycin consult 11/20.  Per vancomycin consult from ID MD: She has a hx of what sounds like mild phlebitis while getting IV through peripheral line- please rechallenge now that has picc  Plan: Vancomycin 1500 mg IV x1 then 1500 mg IV q18h with stacked dosing for goal trough 15-20 mcg/ml for osteomyelitis. Trough before 4th dose of regimen ordered for 11/23 at 1100. Will need to closely follow renal function as pt at risk for dose accumulation. BMET already ordered for AM. Called RN and discussed pt's previous "allergy" to vanc, informed her that we could slow the rate of vanc down if needed and RN stated she will keep [place] informed.   Continue Cefepime 2 g IV q8 hours.  Height: 5\' 2"  (157.5 cm) Weight: 297 lb (134.7 kg) IBW/kg (Calculated) : 50.1  Temp (24hrs), Avg:98.8 F (37.1 C), Min:98 F (36.7 C), Max:100 F (37.8 C)   Recent Labs Lab 08/19/16 0055 08/19/16 2231 08/19/16 2302 08/20/16 0506 08/21/16 0533  WBC 13.3* 11.7*  --  11.0 10.7  CREATININE 0.91 0.75  --  0.73 0.78  LATICACIDVEN 1.5  --  0.9  --   --     Estimated Creatinine Clearance: 85.4 mL/min (by C-G formula based on SCr of 0.78 mg/dL).    Allergies  Allergen Reactions  . Tape Itching, Dermatitis and Rash  . 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 Band-Aid Plus Antibiotic [Bacitracin-Polymyxin B] Other (See Comments)    Unknown  . Iodinated Diagnostic Agents Itching  . Morphine Sulfate Itching  . Vancomycin Rash    Antimicrobials this admission: cefepime 11/18  >>   Zyvox 11/18 >>   Dose adjustments this admission:   Microbiology results: 11/18 BCx: pending 11/18 UCx: pending  11/2 MRSA PCR: (+)    11/18 UA: pending 11/18 CXR upper lobe consolidation L>R  Thank you for allowing pharmacy to be a part  of this patient's care.  Rocky Morel 08/21/2016 3:05 PM

## 2016-08-21 NOTE — Care Management Note (Signed)
Case Management Note  Patient Details  Name: Tracy Espinoza MRN: [id number] Date of Birth: 05-Sep-1946  Subjective/Objective:                  Spoke with daughter Tracy Espinoza by phone. She states that she is not happy with [place] [facility] care and wants her to go back to [facility] for SNF. She is concerned that she has not seen any of her doctors near [place] Dr. Ellene Route and Dr. Ronnald Ramp followed her after infection set in and did I & D. She has also seen Dr. Linus Salmons with ID for this infection however he has not followed her (per [name]) since admission to [facility]. I explained my role at [facility] and home health needs and also CSW role in SNF. [name] states that she understands the only reason patient "had to go to [facility] was because the antibiotic was too expensive". I do not feel that home health would be appropriate however I am not sure if patient has any more SNF days left.   Action/Plan: CSW updated. RNCM will follow along.   Expected Discharge Date:                  Expected Discharge Plan:     In-House Referral:     Discharge planning Services  CM Consult  Post Acute Care Choice:  Home Health Choice offered to:  Adult Children  DME Arranged:    DME Agency:     HH Arranged:    HH Agency:     Status of Service:  In process, will continue to follow  If discussed at Long Length of Stay Meetings, dates discussed:    Additional Comments:  Marshell Garfinkel, RN 08/21/2016, 12:09 PM

## 2016-08-21 NOTE — Consult Note (Signed)
[facility] Infectious Disease     Reason for Consult Spinal osteo, bacteremia, UTI   Referring Physician: Boykin Reaper Date of Admission:  08/19/2016   Principal Problem:   Hypoxia Active Problems:   Healthcare-associated pneumonia   HCAP (healthcare-associated pneumonia)   Acute respiratory failure (HCC)   HPI: Tracy Espinoza is a 70 y.o. Espinoza admitted from SNF with SOB and hypoxia following recent dx of PNA.  She has a complicated hx with regards to a post op spine infection as was admitted at [facility] from 11/1-11/7 where she had MRSA bacteremia and post op wound infection. Initially had surgery on spine 10/9 with lumbar decompression but prior to [facility] admission had fevers, chills, back pain and drainage from incision site. On 11/2 had I and D of large complex lumbar wound infection with debridement of deep tissues and primary closure. Cx from wound  had MRSA and peptostreptococcus and bcx with MRSA.  She was initially on vanco but developed a reaction so had Picc placed and dced on daptomycin until Dec 15th.  Had TTE neg for endocarditis.    This admission she had CXR with Multifocal infiltrate.   She has had culture of her wound now with Pseudomonas and has UCX with E coli. Was in ICU but now on floor.  BCx this admit negative  Past Medical History:  Diagnosis Date  . Anxiety   . Arthritis   . Cancer ([place])    BASAL CELL SKIN CANCERS REMOVED  . GERD (gastroesophageal reflux disease)   . History of hiatal hernia   . Hypertension    Past Surgical History:  Procedure Laterality Date  . ABDOMINAL HYSTERECTOMY    . APPENDECTOMY    . CHOLECYSTECTOMY    . FRACTURE SURGERY     BONE ON SIDE OF RIGHT FOOT  . HERNIA REPAIR     UMBILICAL X 2  . JOINT REPLACEMENT Bilateral    knees  . LUMBAR WOUND DEBRIDEMENT N/A 08/03/2016   Procedure: IRRIGATION AND DEBRIDEMENT LUMBAR WOUND;  Surgeon: Eustace Moore, MD;  Location: [facility];  Service: Neurosurgery;  Laterality: N/A;  . REPAIR OF LEFT URETER  Left 40 YRS AGO  . ROTATOR CUFF REPAIR  2014  . TONSILLECTOMY    . TUBAL LIGATION     Social History  Substance Use Topics  . Smoking status: Never Smoker  . Smokeless tobacco: Never Used  . Alcohol use 1.8 oz/week    3 Glasses of wine per week   Family History  Problem Relation Age of Onset  . Hypertension Mother   . Stroke Mother   . Alzheimer's disease Mother   . Cancer Father     brain  . Hypertension Father   . Stroke Father     Allergies:  Allergies  Allergen Reactions  . Tape Itching, Dermatitis and Rash  . Band-Aid Plus Antibiotic [Bacitracin-Polymyxin B] Other (See Comments)    Unknown  . Iodinated Diagnostic Agents Itching  . Morphine Sulfate Itching  . Vancomycin Rash    Current antibiotics: Antibiotics Given (last 72 hours)    Date/Time Action Medication Dose Rate   08/19/16 1652 Given   ceFEPIme (MAXIPIME) 2 GM / 41m IVPB premix 2 g 100 mL/hr   08/19/16 2245 Given   ceFEPIme (MAXIPIME) 2 GM / 569mIVPB premix 2 g 100 mL/hr   08/20/16 0518 Given   ceFEPIme (MAXIPIME) 2 GM / 507mVPB premix 2 g 100 mL/hr   08/20/16 1436 Given   ceFEPIme (  MAXIPIME) 2 GM / 16m IVPB premix 2 g 100 mL/hr   08/20/16 2221 Given   ceFEPIme (MAXIPIME) 2 GM / 562mIVPB premix 2 g 100 mL/hr   08/21/16 [id number]   ceFEPIme (MAXIPIME) 2 GM / 5062mVPB premix 2 g 100 mL/hr      MEDICATIONS: . acidophilus  1 capsule Oral Daily  . aspirin EC  81 mg Oral Daily  . calcium carbonate  1 tablet Oral Daily  . ceFEPIme  2 g Intravenous Q8H  . cholecalciferol  1,000 Units Oral Daily  . enoxaparin (LOVENOX) injection  40 mg Subcutaneous Q12H  . ipratropium-albuterol  3 mL Nebulization BID  . linezolid (ZYVOX) IV  600 mg Intravenous Q12H  . magnesium oxide  400 mg Oral BID  . mouth rinse  15 mL Mouth Rinse BID  . methocarbamol  500 mg Oral Q6H  . multivitamin  1 tablet Oral Daily  . pantoprazole (PROTONIX) IV  40 mg Intravenous Q24H  . polyethylene glycol  17 g Oral BID  .  senna  1 tablet Oral BID  . sodium chloride flush  10-40 mL Intracatheter Q12H  . sodium chloride flush  3 mL Intravenous Q12H  . traZODone  50 mg Oral QHS  . vitamin C  1,000 mg Oral Daily  . vitamin E  400 Units Oral Daily    Review of Systems - 11 systems reviewed and negative per HPI   OBJECTIVE: Temp:  [98 F (36.7 C)-100 F (37.8 C)] 98 F (36.7 C) (11/20 1339) Pulse Rate:  [65-85] 73 (11/20 1339) Resp:  [13-29] 18 (11/20 1339) BP: (105-139)/(39-94) 139/57 (11/20 1339) SpO2:  [90 %-100 %] 97 % (11/20 1339) Physical Exam  Constitutional:  oriented to person, place, and time. appears well-developed and well-nourished. No distress. morbidly HENT: [place]/AT, PERRLA, no scleral icterus Mouth/Throat: Oropharynx is clear and moist. No oropharyngeal exudate.  Cardiovascular: Normal rate, regular rhythm and normal heart sounds. Exam reveals no gallop and no friction rub.  No murmur heard.  Pulmonary/Chest: poor air movement Neck supple, no nuchal rigidity Abdominal: Soft. Bowel sounds are normal.  exhibits no distension. There is no tenderness.  Lymphadenopathy: no cervical adenopathy. No axillary adenopathy Neurological: alert and oriented to person, place, and time.  Skin: Lumbar incision is intact except for an area midline with with an open mild eschar. There is no significant drainage.  Psychiatric: a normal mood and affect.  behavior is normal.    LABS: Results for orders placed or performed during the hospital encounter of 08/19/16 (from the past 48 hour(s))  CBC     Status: Abnormal   Collection Time: 08/19/16 10:31 PM  Result Value Ref Range   WBC 11.7 (H) 3.6 - 11.0 K/uL   RBC 3.12 (L) 3.80 - 5.20 MIL/uL   Hemoglobin 8.7 (L) 12.0 - 16.0 g/dL   HCT 27.0 (L) 35.0 - 47.0 %   MCV 86.5 80.0 - 100.0 fL   MCH 28.0 26.0 - 34.0 pg   MCHC 32.3 32.0 - 36.0 g/dL   RDW 15.8 (H) 11.5 - 14.5 %   Platelets 313 150 - 440 K/uL  Basic metabolic panel     Status: Abnormal    Collection Time: 08/19/16 10:31 PM  Result Value Ref Range   Sodium 135 135 - 145 mmol/L   Potassium 4.0 3.5 - 5.1 mmol/L   Chloride 94 (L) 101 - 111 mmol/L   CO2 36 (H) 22 - 32 mmol/L   Glucose, Bld  128 (H) 65 - 99 mg/dL   BUN 15 6 - 20 mg/dL   Creatinine, Ser 0.75 0.44 - 1.00 mg/dL   Calcium 8.2 (L) 8.9 - 10.3 mg/dL   GFR calc non Af Amer >60 >60 mL/min   GFR calc Af Amer >60 >60 mL/min    Comment: (NOTE) The eGFR has been calculated using the CKD EPI equation. This calculation has not been validated in all clinical situations. eGFR's persistently <60 mL/min signify possible Chronic Kidney Disease.    Anion gap 5 5 - 15  Magnesium     Status: None   Collection Time: 08/19/16 10:31 PM  Result Value Ref Range   Magnesium 1.7 1.7 - 2.4 mg/dL  Phosphorus     Status: None   Collection Time: 08/19/16 10:31 PM  Result Value Ref Range   Phosphorus 2.8 2.5 - 4.6 mg/dL  Lactic acid, plasma     Status: None   Collection Time: 08/19/16 11:02 PM  Result Value Ref Range   Lactic Acid, Venous 0.9 0.5 - 1.9 mmol/L  Blood gas, arterial     Status: Abnormal   Collection Time: 08/19/16 11:07 PM  Result Value Ref Range   FIO2 1.00    Delivery systems NON-REBREATHER OXYGEN MASK    pH, Arterial 7.43 7.350 - 7.450   pCO2 arterial 59 (H) 32.0 - 48.0 mmHg   pO2, Arterial 113 (H) 83.0 - 108.0 mmHg   Bicarbonate 39.2 (H) 20.0 - 28.0 mmol/L   Acid-Base Excess 13.1 (H) 0.0 - 2.0 mmol/L   O2 Saturation 98.5 %   Patient temperature 37.0    Collection site LEFT RADIAL    Sample type ARTERIAL DRAW    Allens test (pass/fail) PASS PASS  CBC     Status: Abnormal   Collection Time: 08/20/16  5:06 AM  Result Value Ref Range   WBC 11.0 3.6 - 11.0 K/uL   RBC 3.13 (L) 3.80 - 5.20 MIL/uL   Hemoglobin 8.9 (L) 12.0 - 16.0 g/dL   HCT 27.0 (L) 35.0 - 47.0 %   MCV 86.2 80.0 - 100.0 fL   MCH 28.4 26.0 - 34.0 pg   MCHC 32.9 32.0 - 36.0 g/dL   RDW 15.9 (H) 11.5 - 14.5 %   Platelets 306 150 - 440 K/uL   Basic metabolic panel     Status: Abnormal   Collection Time: 08/20/16  5:06 AM  Result Value Ref Range   Sodium 136 135 - 145 mmol/L   Potassium 3.7 3.5 - 5.1 mmol/L   Chloride 92 (L) 101 - 111 mmol/L   CO2 39 (H) 22 - 32 mmol/L   Glucose, Bld 134 (H) 65 - 99 mg/dL   BUN 15 6 - 20 mg/dL   Creatinine, Ser 0.73 0.44 - 1.00 mg/dL   Calcium 8.2 (L) 8.9 - 10.3 mg/dL   GFR calc non Af Amer >60 >60 mL/min   GFR calc Af Amer >60 >60 mL/min    Comment: (NOTE) The eGFR has been calculated using the CKD EPI equation. This calculation has not been validated in all clinical situations. eGFR's persistently <60 mL/min signify possible Chronic Kidney Disease.    Anion gap 5 5 - 15  Procalcitonin - Baseline     Status: None   Collection Time: 08/20/16  5:06 AM  Result Value Ref Range   Procalcitonin 0.26 ng/mL    Comment:        Interpretation: PCT (Procalcitonin) <= 0.5 ng/mL: Systemic infection (sepsis) is not  likely. Local bacterial infection is possible. (NOTE)         ICU PCT Algorithm               Non ICU PCT Algorithm    ----------------------------     ------------------------------         PCT < 0.25 ng/mL                 PCT < 0.1 ng/mL     Stopping of antibiotics            Stopping of antibiotics       strongly encouraged.               strongly encouraged.    ----------------------------     ------------------------------       PCT level decrease by               PCT < 0.25 ng/mL       >= 80% from peak PCT       OR PCT 0.25 - 0.5 ng/mL          Stopping of antibiotics                                             encouraged.     Stopping of antibiotics           encouraged.    ----------------------------     ------------------------------       PCT level decrease by              PCT >= 0.25 ng/mL       < 80% from peak PCT        AND PCT >= 0.5 ng/mL            Continuin g antibiotics                                              encouraged.       Continuing antibiotics             encouraged.    ----------------------------     ------------------------------     PCT level increase compared          PCT > 0.5 ng/mL         with peak PCT AND          PCT >= 0.5 ng/mL             Escalation of antibiotics                                          strongly encouraged.      Escalation of antibiotics        strongly encouraged.   Procalcitonin     Status: None   Collection Time: 08/21/16  5:33 AM  Result Value Ref Range   Procalcitonin 0.31 ng/mL    Comment:        Interpretation: PCT (Procalcitonin) <= 0.5 ng/mL: Systemic infection (sepsis) is not likely. Local bacterial infection is possible. (NOTE)         ICU PCT Algorithm  Non ICU PCT Algorithm    ----------------------------     ------------------------------         PCT < 0.25 ng/mL                 PCT < 0.1 ng/mL     Stopping of antibiotics            Stopping of antibiotics       strongly encouraged.               strongly encouraged.    ----------------------------     ------------------------------       PCT level decrease by               PCT < 0.25 ng/mL       >= 80% from peak PCT       OR PCT 0.25 - 0.5 ng/mL          Stopping of antibiotics                                             encouraged.     Stopping of antibiotics           encouraged.    ----------------------------     ------------------------------       PCT level decrease by              PCT >= 0.25 ng/mL       < 80% from peak PCT        AND PCT >= 0.5 ng/mL            Continuin g antibiotics                                              encouraged.       Continuing antibiotics            encouraged.    ----------------------------     ------------------------------     PCT level increase compared          PCT > 0.5 ng/mL         with peak PCT AND          PCT >= 0.5 ng/mL             Escalation of antibiotics                                          strongly encouraged.      Escalation of antibiotics         strongly encouraged.   Basic metabolic panel     Status: Abnormal   Collection Time: 08/21/16  5:33 AM  Result Value Ref Range   Sodium 136 135 - 145 mmol/L   Potassium 3.4 (L) 3.5 - 5.1 mmol/L   Chloride 87 (L) 101 - 111 mmol/L   CO2 43 (H) 22 - 32 mmol/L   Glucose, Bld 145 (H) 65 - 99 mg/dL   BUN 16 6 - 20 mg/dL   Creatinine, Ser 0.78 0.44 - 1.00 mg/dL   Calcium 8.4 (L) 8.9 - 10.3 mg/dL   GFR calc non Af Amer >60 >60 mL/min  GFR calc Af Amer >60 >60 mL/min    Comment: (NOTE) The eGFR has been calculated using the CKD EPI equation. This calculation has not been validated in all clinical situations. eGFR's persistently <60 mL/min signify possible Chronic Kidney Disease.    Anion gap 6 5 - 15  CBC     Status: Abnormal   Collection Time: 08/21/16  5:33 AM  Result Value Ref Range   WBC 10.7 3.6 - 11.0 K/uL   RBC 3.21 (L) 3.80 - 5.20 MIL/uL   Hemoglobin 9.1 (L) 12.0 - 16.0 g/dL   HCT 27.7 (L) 35.0 - 47.0 %   MCV 86.4 80.0 - 100.0 fL   MCH 28.5 26.0 - 34.0 pg   MCHC 33.0 32.0 - 36.0 g/dL   RDW 15.9 (H) 11.5 - 14.5 %   Platelets 303 150 - 440 K/uL   No components found for: ESR, C REACTIVE PROTEIN MICRO: Recent Results (from the past 720 hour(s))  Blood Culture (routine x 2)     Status: Abnormal   Collection Time: 08/02/16  8:15 PM  Result Value Ref Range Status   Specimen Description BLOOD LEFT FOREARM  Final   Special Requests BOTTLES DRAWN AEROBIC AND ANAEROBIC 5CC  Final   Culture  Setup Time   Final    GRAM POSITIVE COCCI IN CLUSTERS IN BOTH AEROBIC AND ANAEROBIC BOTTLES CRITICAL RESULT CALLED TO, READ BACK BY AND VERIFIED WITH: Hughie Closs, PHARM AT [place] ON [id number] BY M. WILSON    Culture METHICILLIN RESISTANT STAPHYLOCOCCUS AUREUS (A)  Final   Report Status 08/05/2016 FINAL  Final   Organism ID, Bacteria METHICILLIN RESISTANT STAPHYLOCOCCUS AUREUS  Final      Susceptibility   Methicillin resistant staphylococcus aureus - MIC*    CIPROFLOXACIN >=8 RESISTANT  Resistant     ERYTHROMYCIN >=8 RESISTANT Resistant     GENTAMICIN <=0.5 SENSITIVE Sensitive     OXACILLIN >=4 RESISTANT Resistant     TETRACYCLINE <=1 SENSITIVE Sensitive     VANCOMYCIN <=0.5 SENSITIVE Sensitive     TRIMETH/SULFA <=10 SENSITIVE Sensitive     CLINDAMYCIN <=0.25 RESISTANT Resistant     RIFAMPIN <=0.5 SENSITIVE Sensitive     Inducible Clindamycin POSITIVE Resistant     * METHICILLIN RESISTANT STAPHYLOCOCCUS AUREUS  Urine culture     Status: Abnormal   Collection Time: 08/02/16  8:15 PM  Result Value Ref Range Status   Specimen Description URINE, CATHETERIZED  Final   Special Requests NONE  Final   Culture (A)  Final    >=100,000 COLONIES/mL STAPHYLOCOCCUS SPECIES (COAGULASE NEGATIVE)   Report Status 08/05/2016 FINAL  Final   Organism ID, Bacteria STAPHYLOCOCCUS SPECIES (COAGULASE NEGATIVE) (A)  Final      Susceptibility   Staphylococcus species (coagulase negative) - MIC*    CIPROFLOXACIN >=8 RESISTANT Resistant     GENTAMICIN <=0.5 SENSITIVE Sensitive     NITROFURANTOIN <=16 SENSITIVE Sensitive     OXACILLIN >=4 RESISTANT Resistant     TETRACYCLINE <=1 SENSITIVE Sensitive     VANCOMYCIN <=0.5 SENSITIVE Sensitive     TRIMETH/SULFA <=10 SENSITIVE Sensitive     CLINDAMYCIN <=0.25 SENSITIVE Sensitive     RIFAMPIN <=0.5 SENSITIVE Sensitive     Inducible Clindamycin NEGATIVE Sensitive     * >=100,000 COLONIES/mL STAPHYLOCOCCUS SPECIES (COAGULASE NEGATIVE)  Blood Culture ID Panel (Reflexed)     Status: Abnormal   Collection Time: 08/02/16  8:15 PM  Result Value Ref Range Status   Enterococcus species NOT DETECTED NOT DETECTED Final  Listeria monocytogenes NOT DETECTED NOT DETECTED Final   Staphylococcus species DETECTED (A) NOT DETECTED Final    Comment: CRITICAL RESULT CALLED TO, READ BACK BY AND VERIFIED WITH: M. MACCIA, PHARM AT [address] ON [id number] BY S. YARBROUGH    Staphylococcus aureus DETECTED (A) NOT DETECTED Final    Comment: CRITICAL RESULT CALLED TO, READ  BACK BY AND VERIFIED WITH: M. MACCIA, PHARM AT [address] ON [address] BY S. YARBROUGH    Methicillin resistance DETECTED (A) NOT DETECTED Final    Comment: CRITICAL RESULT CALLED TO, READ BACK BY AND VERIFIED WITH: M. MACCIA, PHARM AT [address] ON [address] BY S. YARBROUGH    Streptococcus species NOT DETECTED NOT DETECTED Final   Streptococcus agalactiae NOT DETECTED NOT DETECTED Final   Streptococcus pneumoniae NOT DETECTED NOT DETECTED Final   Streptococcus pyogenes NOT DETECTED NOT DETECTED Final   Acinetobacter baumannii NOT DETECTED NOT DETECTED Final   Enterobacteriaceae species NOT DETECTED NOT DETECTED Final   Enterobacter cloacae complex NOT DETECTED NOT DETECTED Final   Escherichia coli NOT DETECTED NOT DETECTED Final   Klebsiella oxytoca NOT DETECTED NOT DETECTED Final   Klebsiella pneumoniae NOT DETECTED NOT DETECTED Final   Proteus species NOT DETECTED NOT DETECTED Final   Serratia marcescens NOT DETECTED NOT DETECTED Final   Haemophilus influenzae NOT DETECTED NOT DETECTED Final   Neisseria meningitidis NOT DETECTED NOT DETECTED Final   Pseudomonas aeruginosa NOT DETECTED NOT DETECTED Final   Candida albicans NOT DETECTED NOT DETECTED Final   Candida glabrata NOT DETECTED NOT DETECTED Final   Candida krusei NOT DETECTED NOT DETECTED Final   Candida parapsilosis NOT DETECTED NOT DETECTED Final   Candida tropicalis NOT DETECTED NOT DETECTED Final  Blood Culture (routine x 2)     Status: Abnormal   Collection Time: 08/02/16  8:35 PM  Result Value Ref Range Status   Specimen Description BLOOD LEFT FOREARM  Final   Special Requests IN PEDIATRIC BOTTLE 1CC  Final   Culture  Setup Time   Final    GRAM POSITIVE COCCI IN CLUSTERS IN PEDIATRIC BOTTLE CRITICAL VALUE NOTED.  VALUE IS CONSISTENT WITH PREVIOUSLY REPORTED AND CALLED VALUE.    Culture (A)  Final    STAPHYLOCOCCUS AUREUS SUSCEPTIBILITIES PERFORMED ON PREVIOUS CULTURE WITHIN THE LAST 5 DAYS.    Report Status 08/05/2016 FINAL   Final  MRSA PCR Screening     Status: Abnormal   Collection Time: 08/03/16  3:40 PM  Result Value Ref Range Status   MRSA by PCR POSITIVE (A) NEGATIVE Final    Comment:        The GeneXpert MRSA Assay (FDA approved for NASAL specimens only), is one component of a comprehensive MRSA colonization surveillance program. It is not intended to diagnose MRSA infection nor to guide or monitor treatment for MRSA infections. RESULT CALLED TO, READ BACK BY AND VERIFIED WITH: Vladimir Creeks RN [id number] 08/03/16 A BROWNING   Aerobic/Anaerobic Culture (surgical/deep wound)     Status: None   Collection Time: 08/03/16  5:18 PM  Result Value Ref Range Status   Specimen Description WOUND  Final   Special Requests LUMBAR  Final   Gram Stain   Final    RARE WBC PRESENT,BOTH PMN AND MONONUCLEAR NO ORGANISMS SEEN    Culture   Final    FEW METHICILLIN RESISTANT STAPHYLOCOCCUS AUREUS RARE PEPTOSTREPTOCOCCUS SPECIES    Report Status 08/09/2016 FINAL  Final   Organism ID, Bacteria METHICILLIN RESISTANT STAPHYLOCOCCUS AUREUS  Final  Susceptibility   Methicillin resistant staphylococcus aureus - MIC*    CIPROFLOXACIN >=8 RESISTANT Resistant     ERYTHROMYCIN >=8 RESISTANT Resistant     GENTAMICIN <=0.5 SENSITIVE Sensitive     OXACILLIN >=4 RESISTANT Resistant     TETRACYCLINE <=1 SENSITIVE Sensitive     VANCOMYCIN <=0.5 SENSITIVE Sensitive     TRIMETH/SULFA <=10 SENSITIVE Sensitive     CLINDAMYCIN <=0.25 RESISTANT Resistant     RIFAMPIN <=0.5 SENSITIVE Sensitive     Inducible Clindamycin POSITIVE Resistant     * FEW METHICILLIN RESISTANT STAPHYLOCOCCUS AUREUS  Culture, blood (routine x 2)     Status: None   Collection Time: 08/05/16  4:27 AM  Result Value Ref Range Status   Specimen Description BLOOD RIGHT HAND  Final   Special Requests IN PEDIATRIC BOTTLE 3ML  Final   Culture NO GROWTH 5 DAYS  Final   Report Status 08/10/2016 FINAL  Final  Culture, blood (routine x 2)     Status: None   Collection  Time: 08/05/16  4:40 AM  Result Value Ref Range Status   Specimen Description BLOOD LEFT HAND  Final   Special Requests AEROBIC BOTTLE ONLY 5ML  Final   Culture NO GROWTH 5 DAYS  Final   Report Status 08/10/2016 FINAL  Final  Blood Culture (routine x 2)     Status: None (Preliminary result)   Collection Time: 08/19/16 12:55 AM  Result Value Ref Range Status   Specimen Description BLOOD LEFT LATERAL  Final   Special Requests   Final    BOTTLES DRAWN AEROBIC AND ANAEROBIC 10CCAERO,12CCANA   Culture NO GROWTH 2 DAYS  Final   Report Status PENDING  Incomplete  Blood Culture (routine x 2)     Status: None (Preliminary result)   Collection Time: 08/19/16 12:55 AM  Result Value Ref Range Status   Specimen Description BLOOD LEFT HAND  Final   Special Requests BOTTLES DRAWN AEROBIC AND ANAEROBIC Reserve  Final   Culture NO GROWTH 2 DAYS  Final   Report Status PENDING  Incomplete  Urine culture     Status: Abnormal   Collection Time: 08/19/16 12:55 AM  Result Value Ref Range Status   Specimen Description URINE, RANDOM  Final   Special Requests NONE  Final   Culture >=100,000 COLONIES/mL ESCHERICHIA COLI (A)  Final   Report Status 08/21/2016 FINAL  Final   Organism ID, Bacteria ESCHERICHIA COLI (A)  Final      Susceptibility   Escherichia coli - MIC*    AMPICILLIN >=32 RESISTANT Resistant     CEFAZOLIN <=4 SENSITIVE Sensitive     CEFTRIAXONE <=1 SENSITIVE Sensitive     CIPROFLOXACIN 0.5 SENSITIVE Sensitive     GENTAMICIN <=1 SENSITIVE Sensitive     IMIPENEM <=0.25 SENSITIVE Sensitive     NITROFURANTOIN <=16 SENSITIVE Sensitive     TRIMETH/SULFA <=20 SENSITIVE Sensitive     AMPICILLIN/SULBACTAM 4 SENSITIVE Sensitive     PIP/TAZO <=4 SENSITIVE Sensitive     Extended ESBL NEGATIVE Sensitive     * >=100,000 COLONIES/mL ESCHERICHIA COLI  MRSA PCR Screening     Status: None   Collection Time: 08/19/16  8:25 AM  Result Value Ref Range Status   MRSA by PCR NEGATIVE NEGATIVE Final     Comment:        The GeneXpert MRSA Assay (FDA approved for NASAL specimens only), is one component of a comprehensive MRSA colonization surveillance program. It is not intended to diagnose MRSA infection  nor to guide or monitor treatment for MRSA infections.   Aerobic/Anaerobic Culture (surgical/deep wound)     Status: None (Preliminary result)   Collection Time: 08/19/16  1:08 PM  Result Value Ref Range Status   Specimen Description BACK  Final   Special Requests NONE  Final   Gram Stain   Final    FEW WBC PRESENT, PREDOMINANTLY PMN MODERATE GRAM NEGATIVE RODS Performed at [facility]    Culture   Final    MODERATE PSEUDOMONAS AERUGINOSA NO ANAEROBES ISOLATED; CULTURE IN PROGRESS FOR 5 DAYS    Report Status PENDING  Incomplete   Organism ID, Bacteria PSEUDOMONAS AERUGINOSA  Final      Susceptibility   Pseudomonas aeruginosa - MIC*    CEFTAZIDIME 4 SENSITIVE Sensitive     CIPROFLOXACIN <=0.25 SENSITIVE Sensitive     GENTAMICIN <=1 SENSITIVE Sensitive     IMIPENEM 2 SENSITIVE Sensitive     PIP/TAZO 8 SENSITIVE Sensitive     CEFEPIME 2 SENSITIVE Sensitive     * MODERATE PSEUDOMONAS AERUGINOSA    IMAGING: Mr Lumbar Spine Wo Contrast  Result Date: 08/03/2016 CLINICAL DATA:  70 y/o  F; fever and sepsis. EXAM: MRI LUMBAR SPINE WITHOUT CONTRAST TECHNIQUE: Multiplanar, multisequence MR imaging of the lumbar spine was performed. No intravenous contrast was administered. COMPARISON:  07/27/2016 lumbar radiographs.  03/13/2016 lumbar MRI. FINDINGS: Segmentation:  Standard. Alignment:  Normal lumbar lordosis.  No listhesis. Vertebrae: Status post L4-5 posterior instrumented fusion, interbody fusion, and laminectomy. The vertebral bodies at those levels are partially obscured by susceptibility artifact from the fusion hardware. There is increased T2 signal of the L4-5 intervertebral disc without appreciable abnormal signal in the opposing articular endplates probably representing  postsurgical changes related to the interbody fusion. No definite evidence for discitis, fracture, or suspicious osseous lesion. Conus medullaris: Extends to the T12-L1 level and appears normal. Paraspinal and other soft tissues: Atrophic left kidney with multiple cysts. There is a fluid collection within the subcutaneous fat of the back at the level of surgical fusion extending to the myofascial boundary measuring approximately 50 x 70 x 70 mm (AP x ML x CC). Additionally, there is extensive edema in the paraspinal muscles at the site of surgery and at levels of laminectomy. Disc levels: L1-2: Small disc bulge and mild facet hypertrophy greater on the right with mild right-sided foraminal narrowing and lateral recess narrowing. L2-3: Small disc bulge and mild bilateral facet hypertrophy greater on the right. Right greater than left mild foraminal and lateral recess narrowing. L3-4: Small disc bulge and moderate ligamentum flavum/ facet hypertrophy. Mild bilateral foraminal narrowing, right greater than left lateral recess narrowing with contact upon descending right L4 nerve root. Mild canal stenosis. L4-5: Postsurgical changes related to discectomy and interbody fusion. There is intermediate T1 and high T2 signal within the paraspinal muscles and the laminectomy beds. There is encroachment on the posterior aspect of thecal sac bilaterally with moderate canal stenosis and foraminal narrowing bilaterally. L5-S1: Small central protrusion and mild facet arthropathy without significant foraminal narrowing or canal stenosis. IMPRESSION: 1. Edema within paraspinal muscles and the laminectomy bed at the L4-5 levels with encroachment on the thecal sac posteriorly at L4-5 with moderate canal stenosis and bilateral foraminal narrowing, probably postsurgical edema. Suboptimal assessment for abscess or epidural fluid collections in the absence of intravenous contrast. 2. Large fluid collection in the subcutaneous fat at level  of surgery likely a postoperative seroma. 3. Increased T2 signal within the L4-5 disc space  without abnormal signal of opposing endplates is probably postoperative related to interbody fusion. No definite evidence for discitis. 4. Otherwise multilevel degenerative changes with mild foraminal narrowing and mild canal stenosis at the L3-4 level. Electronically Signed   By: Kristine Garbe M.D.   On: 08/03/2016 00:51   Dg Chest Port 1 View  Result Date: 08/21/2016 CLINICAL DATA:  Respiratory failure. EXAM: PORTABLE CHEST 1 VIEW COMPARISON:  08/19/2016 . FINDINGS: Right PICC line stable position. Stable cardiomegaly. Interim slight improvement of patchy bilateral pulmonary infiltrates. No pleural effusion or pneumothorax. IMPRESSION: 1. Right PICC line in stable position. 2.  Interim partial clearing of bilateral pulmonary infiltrates. Electronically Signed   By: Marcello Moores  Register   On: 08/21/2016 07:47   Dg Chest Port 1 View  Result Date: 08/19/2016 CLINICAL DATA:  Acute onset of respiratory failure. Worsening dyspnea and hypoxia. Initial encounter. EXAM: PORTABLE CHEST 1 VIEW COMPARISON:  Chest radiograph performed earlier today at 1:00 a.m. FINDINGS: A right PICC is noted ending about the mid SVC. The lungs are well-aerated. Patchy bilateral airspace opacities raise concern for multifocal pneumonia, worsened from the prior study. There is no evidence of pleural effusion or pneumothorax. The cardiomediastinal silhouette is borderline enlarged. No acute osseous abnormalities are seen. IMPRESSION: Patchy bilateral airspace opacities raise concern for multifocal pneumonia, worsened from the prior study. Borderline cardiomegaly. Followup PA and lateral chest X-ray is recommended in 3-4 weeks following trial of antibiotic therapy to ensure resolution and exclude underlying malignancy. Electronically Signed   By: Garald Balding M.D.   On: 08/19/2016 22:55   Dg Chest Port 1 View  Result Date:  08/19/2016 CLINICAL DATA:  Dyspnea and hypoxia tonight EXAM: PORTABLE CHEST 1 VIEW COMPARISON:  08/07/2016 FINDINGS: Right upper extremity PICC line extends into the SVC. Left upper lobe consolidation, new from 08/07/2016. Patchy opacity in the right upper lobe. No large effusion. Unchanged borderline cardiomegaly. IMPRESSION: Upper lobe consolidation, left greater than right, likely pneumonia. Followup PA and lateral chest X-ray is recommended in 3-4 weeks following trial of antibiotic therapy to ensure resolution and exclude underlying malignancy. Electronically Signed   By: Andreas Newport M.D.   On: 08/19/2016 01:12   Dg Chest Port 1 View  Result Date: 08/07/2016 CLINICAL DATA:  Central catheter placement EXAM: PORTABLE CHEST 1 VIEW COMPARISON:  August 02, 2016 and May 03, 2011 FINDINGS: Central catheter tip is in the superior vena cava. No pneumothorax. There is atelectatic change in the right lower lobe. There are subcentimeter nodular opacities in the left apex, stable from most recent study but not present on the 2012 study. Heart size and pulmonary vascularity are normal. No adenopathy. There is postoperative change in the right shoulder. IMPRESSION: Central catheter tip in superior vena cava. No pneumothorax. Right lower lobe atelectasis. Nodular opacities left apex. Advise noncontrast enhanced chest CT to further assess. Electronically Signed   By: Lowella Grip III M.D.   On: 08/07/2016 13:15   Dg Chest Port 1 View  Result Date: 08/02/2016 CLINICAL DATA:  Postop fever, hypoxia, and tachypnea. Recent back surgery. EXAM: PORTABLE CHEST 1 VIEW COMPARISON:  05/03/2011 FINDINGS: Mild cardiomegaly and tortuosity of thoracic aorta again noted. Aortic atherosclerosis. Both lungs are clear. No evidence of pneumothorax or pleural effusion. IMPRESSION: Stable mild cardiomegaly and aortic atherosclerosis. No active lung disease. Electronically Signed   By: Earle Gell M.D.   On: 08/02/2016 20:20     Assessment:   ARASELY AKKERMAN is a 70 y.o. Espinoza with complicated hx  of post op Lumbar spine infection with I and D culture growing MRSA and peptostreptococcus, prior MRSA bacteremia, now with multifocal PNA, repeat wound cx with Pseudomonas and UCX E coli. She was on daptomycin at a facility at time of admission.  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 She did have mild eosinophilia on admit.  Would need a BAL to prove this but she has never had PNA before per her report had no pulmonary issues prior.  She is also now growing pseudomonas from her wound and will need that treated as well as coverage of the peptostreptococcus from initial wound.    Recommendations Would NOT restart dapto and I have added this to her allergy list- should not be rechallenged Cont cefepime for now but at dc would place on cipro orally for the pseudomonas on wound which seems superficial. Will retry vancomycin as she states when given prior she had only some redness at the peripheral vein site so may have been mild phlebitis and now has PICC line. If tolerates vanco will need to continue it as otpt for full course - previously until Dec 15th  I discussed this plan with the patient and she has agreed to retry the vancomycin.  Thank you very much for allowing me to participate in the care of this patient. Please call with questions.   Cheral Marker. Ola Spurr, MD

## 2016-08-22 LAB — BASIC METABOLIC PANEL
Anion gap: 5 (ref 5–15)
BUN: 14 mg/dL (ref 6–20)
CALCIUM: 8.7 mg/dL — AB (ref 8.9–10.3)
CO2: 42 mmol/L — ABNORMAL HIGH (ref 22–32)
CREATININE: 0.62 mg/dL (ref 0.44–1.00)
Chloride: 92 mmol/L — ABNORMAL LOW (ref 101–111)
GFR calc non Af Amer: >60 mL/min (ref >60)
Glucose, Bld: 110 mg/dL — ABNORMAL HIGH (ref 65–99)
Potassium: 3.9 mmol/L (ref 3.5–5.1)
SODIUM: 139 mmol/L (ref 135–145)

## 2016-08-22 LAB — CBC
HCT: 27.7 % — ABNORMAL LOW (ref 35.0–47.0)
HEMOGLOBIN: 9 g/dL — AB (ref 12.0–16.0)
MCH: 28.4 pg (ref 26.0–34.0)
MCHC: 32.4 g/dL (ref 32.0–36.0)
MCV: 87.8 fL (ref 80.0–100.0)
PLATELETS: 325 10*3/uL (ref 150–440)
RBC: 3.15 MIL/uL — ABNORMAL LOW (ref 3.80–5.20)
RDW: 16.1 % — AB (ref 11.5–14.5)
WBC: 8.7 10*3/uL (ref 3.6–11.0)

## 2016-08-22 LAB — MAGNESIUM: MAGNESIUM: 2 mg/dL (ref 1.7–2.4)

## 2016-08-22 LAB — PROCALCITONIN: PROCALCITONIN: 0.27 ng/mL

## 2016-08-22 MED ORDER — METOCLOPRAMIDE HCL 5 MG/ML IJ SOLN
5.0000 mg | Freq: Three times a day (TID) | INTRAMUSCULAR | Status: DC | PRN
Start: 2016-08-22 — End: 2016-08-24
  Administered 2016-08-22: 5 mg via INTRAVENOUS
  Filled 2016-08-22: qty 2

## 2016-08-22 MED ORDER — VANCOMYCIN HCL 10 G IV SOLR
1250.0000 mg | Freq: Two times a day (BID) | INTRAVENOUS | Status: DC
  Administered 2016-08-22 – 2016-08-23 (×3): 1250 mg via INTRAVENOUS
  Filled 2016-08-22 (×5): qty 1250

## 2016-08-22 MED ORDER — METOPROLOL SUCCINATE ER 25 MG PO TB24
25.0000 mg | ORAL_TABLET | Freq: Every day | ORAL | Status: DC
  Administered 2016-08-22 – 2016-08-24 (×3): 25 mg via ORAL
  Filled 2016-08-22 (×3): qty 1

## 2016-08-22 MED ORDER — PANTOPRAZOLE SODIUM 40 MG PO TBEC
40.0000 mg | DELAYED_RELEASE_TABLET | Freq: Every day | ORAL | Status: DC
  Administered 2016-08-23 – 2016-08-24 (×2): 40 mg via ORAL
  Filled 2016-08-22 (×2): qty 1

## 2016-08-22 NOTE — Progress Notes (Signed)
[facility] INFECTIOUS DISEASE PROGRESS NOTE Date of Admission:  08/19/2016     ID: Tracy Espinoza is a 70 y.o. female with MRSA bacteremia, post op lumbar infection and daptomycin eosinophilic PNA Principal Problem:   Hypoxia Active Problems:   Healthcare-associated pneumonia   HCAP (healthcare-associated pneumonia)   Acute respiratory failure (HCC)   Subjective: Feels better. No fevers. Tolerated vanco. Up oob 4 hours today   ROS  Eleven systems are reviewed and negative except per hpi  Medications:  Antibiotics Given (last 72 hours)    Date/Time Action Medication Dose Rate   08/19/16 1652 Given   ceFEPIme (MAXIPIME) 2 GM / 53mL IVPB premix 2 g 100 mL/hr   08/19/16 2245 Given   ceFEPIme (MAXIPIME) 2 GM / 8mL IVPB premix 2 g 100 mL/hr   08/20/16 0518 Given   ceFEPIme (MAXIPIME) 2 GM / 39mL IVPB premix 2 g 100 mL/hr   08/20/16 1436 Given   ceFEPIme (MAXIPIME) 2 GM / 82mL IVPB premix 2 g 100 mL/hr   08/20/16 2221 Given   ceFEPIme (MAXIPIME) 2 GM / 57mL IVPB premix 2 g 100 mL/hr   08/21/16 [id number] Given   ceFEPIme (MAXIPIME) 2 GM / 72mL IVPB premix 2 g 100 mL/hr   08/21/16 1636 Given   ceFEPIme (MAXIPIME) 2 GM / 67mL IVPB premix 2 g 100 mL/hr   08/21/16 1701 Given   vancomycin (VANCOCIN) 1,500 mg in sodium chloride 0.9 % 500 mL IVPB 1,500 mg 250 mL/hr   08/21/16 2206 Given   ceFEPIme (MAXIPIME) 2 GM / 2mL IVPB premix 2 g 100 mL/hr   08/22/16 0538 Given   ceFEPIme (MAXIPIME) 2 GM / 16mL IVPB premix 2 g 100 mL/hr   08/22/16 0539 Given   vancomycin (VANCOCIN) 1,500 mg in sodium chloride 0.9 % 500 mL IVPB 1,500 mg 250 mL/hr     . acidophilus  1 capsule Oral Daily  . aspirin EC  81 mg Oral Daily  . calcium carbonate  1 tablet Oral Daily  . ceFEPIme  2 g Intravenous Q8H  . cholecalciferol  1,000 Units Oral Daily  . enoxaparin (LOVENOX) injection  40 mg Subcutaneous Q12H  . ipratropium-albuterol  3 mL Nebulization BID  . magnesium oxide  400 mg Oral BID  . mouth rinse   15 mL Mouth Rinse BID  . methocarbamol  500 mg Oral Q6H  . metoprolol succinate  25 mg Oral Daily  . multivitamin with minerals  1 tablet Oral Daily  . [START ON 08/23/2016] pantoprazole  40 mg Oral Daily  . polyethylene glycol  17 g Oral BID  . senna  1 tablet Oral BID  . sodium chloride flush  10-40 mL Intracatheter Q12H  . sodium chloride flush  3 mL Intravenous Q12H  . traZODone  50 mg Oral QHS  . vancomycin  1,250 mg Intravenous Q12H  . vitamin C  1,000 mg Oral Daily  . vitamin E  400 Units Oral Daily    Objective: Vital signs in last 24 hours: Temp:  [97.6 F (36.4 C)-98.6 F (37 C)] 98.6 F (37 C) (11/21 1133) Pulse Rate:  [75-80] 80 (11/21 1133) Resp:  [20] 20 (11/21 1133) BP: (126-145)/(49-66) 134/66 (11/21 1133) SpO2:  [92 %-97 %] 93 % (11/21 1133) Constitutional:  oriented to person, place, and time. appears well-developed and well-nourished. No distress. morbidly HENT: [place]/AT, PERRLA, no scleral icterus Mouth/Throat: Oropharynx is clear and moist. No oropharyngeal exudate.  Cardiovascular: Normal rate, regular rhythm and normal heart  sounds. Exam reveals no gallop and no friction rub.  No murmur heard.  Pulmonary/Chest: poor air movement Neck supple, no nuchal rigidity Abdominal: Soft. Bowel sounds are normal.  exhibits no distension. There is no tenderness.  Lymphadenopathy: no cervical adenopathy. No axillary adenopathy Neurological: alert and oriented to person, place, and time.  Skin: Lumbar incision is intact except for an area midline with with an open mild eschar. There is no significant drainage.  Psychiatric: a normal mood and affect.  behavior is normal.    Lab Results  Recent Labs  08/21/16 0533 08/22/16 0543  WBC 10.7 8.7  HGB 9.1* 9.0*  HCT 27.7* 27.7*  NA 136 139  K 3.4* 3.9  CL 87* 92*  CO2 43* 42*  BUN 16 14  CREATININE 0.78 0.62    Microbiology: Results for orders placed or performed during the hospital encounter of 08/19/16   Blood Culture (routine x 2)     Status: None (Preliminary result)   Collection Time: 08/19/16 12:55 AM  Result Value Ref Range Status   Specimen Description BLOOD LEFT LATERAL  Final   Special Requests   Final    BOTTLES DRAWN AEROBIC AND ANAEROBIC 10CCAERO,12CCANA   Culture NO GROWTH 3 DAYS  Final   Report Status PENDING  Incomplete  Blood Culture (routine x 2)     Status: None (Preliminary result)   Collection Time: 08/19/16 12:55 AM  Result Value Ref Range Status   Specimen Description BLOOD LEFT HAND  Final   Special Requests BOTTLES DRAWN AEROBIC AND ANAEROBIC [place]  Final   Culture NO GROWTH 3 DAYS  Final   Report Status PENDING  Incomplete  Urine culture     Status: Abnormal   Collection Time: 08/19/16 12:55 AM  Result Value Ref Range Status   Specimen Description URINE, RANDOM  Final   Special Requests NONE  Final   Culture >=100,000 COLONIES/mL ESCHERICHIA COLI (A)  Final   Report Status 08/21/2016 FINAL  Final   Organism ID, Bacteria ESCHERICHIA COLI (A)  Final      Susceptibility   Escherichia coli - MIC*    AMPICILLIN >=32 RESISTANT Resistant     CEFAZOLIN <=4 SENSITIVE Sensitive     CEFTRIAXONE <=1 SENSITIVE Sensitive     CIPROFLOXACIN 0.5 SENSITIVE Sensitive     GENTAMICIN <=1 SENSITIVE Sensitive     IMIPENEM <=0.25 SENSITIVE Sensitive     NITROFURANTOIN <=16 SENSITIVE Sensitive     TRIMETH/SULFA <=20 SENSITIVE Sensitive     AMPICILLIN/SULBACTAM 4 SENSITIVE Sensitive     PIP/TAZO <=4 SENSITIVE Sensitive     Extended ESBL NEGATIVE Sensitive     * >=100,000 COLONIES/mL ESCHERICHIA COLI  MRSA PCR Screening     Status: None   Collection Time: 08/19/16  8:25 AM  Result Value Ref Range Status   MRSA by PCR NEGATIVE NEGATIVE Final    Comment:        The GeneXpert MRSA Assay (FDA approved for NASAL specimens only), is one component of a comprehensive MRSA colonization surveillance program. It is not intended to diagnose MRSA infection nor to guide  or monitor treatment for MRSA infections.   Aerobic/Anaerobic Culture (surgical/deep wound)     Status: None (Preliminary result)   Collection Time: 08/19/16  1:08 PM  Result Value Ref Range Status   Specimen Description BACK  Final   Special Requests NONE  Final   Gram Stain   Final    FEW WBC PRESENT, PREDOMINANTLY PMN MODERATE GRAM NEGATIVE RODS  Performed at [facility]    Culture   Final    MODERATE PSEUDOMONAS AERUGINOSA NO ANAEROBES ISOLATED; CULTURE IN PROGRESS FOR 5 DAYS    Report Status PENDING  Incomplete   Organism ID, Bacteria PSEUDOMONAS AERUGINOSA  Final      Susceptibility   Pseudomonas aeruginosa - MIC*    CEFTAZIDIME 4 SENSITIVE Sensitive     CIPROFLOXACIN <=0.25 SENSITIVE Sensitive     GENTAMICIN <=1 SENSITIVE Sensitive     IMIPENEM 2 SENSITIVE Sensitive     PIP/TAZO 8 SENSITIVE Sensitive     CEFEPIME 2 SENSITIVE Sensitive     * MODERATE PSEUDOMONAS AERUGINOSA    Studies/Results: Dg Chest Port 1 View  Result Date: 08/21/2016 CLINICAL DATA:  Respiratory failure. EXAM: PORTABLE CHEST 1 VIEW COMPARISON:  08/19/2016 . FINDINGS: Right PICC line stable position. Stable cardiomegaly. Interim slight improvement of patchy bilateral pulmonary infiltrates. No pleural effusion or pneumothorax. IMPRESSION: 1. Right PICC line in stable position. 2.  Interim partial clearing of bilateral pulmonary infiltrates. Electronically Signed   By: Marcello Moores  Register   On: 08/21/2016 07:47    Assessment/Plan: ADISEN HUTLEY is a 70 y.o. female with complicated hx of post op Lumbar spine infection with I and D culture growing MRSA and peptostreptococcus, prior MRSA bacteremia, now with multifocal PNA, repeat wound cx with Pseudomonas and UCX E coli. She was on daptomycin at a facility at time of admission.  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 She did have mild eosinophilia on admit.  Would need a BAL to prove this but she has never had PNA before per her report had no pulmonary issues prior.  She is also now growing pseudomonas from her wound and will need that treated as well as coverage of the peptostreptococcus from initial wound.    Recommendations Would NOT restart dapto and I have added this to her allergy list- should not be rechallenged Cont cefepime for now but at dc would place on cipro orally for the pseudomonas on wound which seems superficial. Would give 3 weeks more Since she is tolerating the vancomycin  will need to continue it as otpt for full course - previously until Dec 15th  I discussed this plan with the patient and she has agreed to retry the vancomycin.  I can see in 2-3 weeks and follow closely Thank you very much for the consult. Will follow with you.   [place], Konnie Noffsinger P   08/22/2016, 3:05 PM

## 2016-08-22 NOTE — Progress Notes (Signed)
Patient ID: Tracy Espinoza, female   DOB: [birth year], 70 y.o.   MRN: [id number]   [place] [facility] [place] NAME: Tracy Espinoza    MR#:  [id number]  DATE OF BIRTH:  06/13/1946  SUBJECTIVE:  CHIEF COMPLAINT:   Chief Complaint  Patient presents with  . Shortness of Breath   She is a 70 year old female with history of anxiety disorder, arthritis, GERD, hiatal hernia, sepsis, wound infection, MRSA bacteremia who is admitted with up acute hypoxic Respiratory failure secondary to bilateral pneumonia, anemia, hypertension, MRSA bacteremia and wound infection and urinary tract infection. Patient has a complicated history significant for spinal stenosis surgery in the spring of 0000000 which is complicated by MRSA wound infection and prolonged stay in rehabilitation. She was admitted to stepdown started on IV cefepime and Zyvox, with blood, urine and wound cultures pending.  Shortness of breath improving. Patient worked with physical therapy and is sitting in chair. On 4 L oxygen.  REVIEW OF SYSTEMS:  ROS  CONSTITUTIONAL:  hasweakness.  EYES: No blurred or double vision.  EARS, NOSE, AND THROAT: No tinnitus or ear pain.  RESPIRATORY: [place], shortness of breath, , dyspnea on exertion no wheezing or hemoptysis.  CARDIOVASCULAR: No chest pain, orthopnea, has edema.  GASTROINTESTINAL: No nausea, vomiting, diarrhea or abdominal pain.  GENITOURINARY: No dysuria, hematuria.  ENDOCRINE: No polyuria, nocturia,  HEMATOLOGY: No anemia, easy bruising or bleeding SKIN: No rash or lesion. MUSCULOSKELETAL: No joint pain or arthritis. Positive for low back pain NEUROLOGIC: No tingling, numbness, weakness.  PSYCHIATRY: No anxiety or depression.   DRUG ALLERGIES:   Allergies  Allergen Reactions  . Tape Itching, Dermatitis and Rash  . 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 Band-Aid Plus Antibiotic [Bacitracin-Polymyxin B]  Other (See Comments)    Unknown  . Iodinated Diagnostic Agents Itching  . Morphine Sulfate Itching  . Vancomycin Rash   VITALS:  Blood pressure (!) 145/60, pulse 77, temperature 98.2 F (36.8 C), temperature source Oral, resp. rate 20, height 5\' 2"  (1.575 m), weight 134.7 kg (297 lb), SpO2 92 %. PHYSICAL EXAMINATION:  Physical Exam  GENERAL: 70 y.o.-year-old obesepatient lying in the bed. Obese EYES: Pupils equal, round, reactive to light and accommodation. No scleral icterus. Extraocular muscles intact.  HEENT: Head atraumatic, normocephalic. Oropharynx and nasopharynx clear.  NECK: Supple, no jugular venous distention. No thyroid enlargement, no tenderness.  LUNGS: Decreasedbreath sounds bilaterally, rales heard bilaterally in both lungs.No use of accessory muscles of respiration.  CARDIOVASCULAR: S1, S2 normal. No murmurs, rubs, or gallops.  ABDOMEN: Soft, obese,nontender, nondistended. Bowel sounds present. No organomegaly or mass.  EXTREMITIES: haspedal edema,nocyanosis, or clubbing.  Trunk : Back reveals midline surgical scar. NEUROLOGIC: Cranial nerves II through XII are intact. Moves all 4 extremities symmetrically PSYCHIATRIC: The patient is alert and oriented x 3.  LABORATORY PANEL:   CBC  Recent Labs Lab 08/22/16 0543  WBC 8.7  HGB 9.0*  HCT 27.7*  PLT 325   ------------------------------------------------------------------------------------------------------------------ Chemistries   Recent Labs Lab 08/19/16 0055  08/22/16 0543  NA 135  < > 139  K 3.9  < > 3.9  CL 97*  < > 92*  CO2 32  < > 42*  GLUCOSE 179*  < > 110*  BUN 21*  < > 14  CREATININE 0.91  < > 0.62  CALCIUM 8.2*  < > 8.7*  MG  --   < > 2.0  AST  19  --   --   ALT 10*  --   --   ALKPHOS 56  --   --   BILITOT 0.6  --   --   < > = values in this interval not displayed. RADIOLOGY:  No results found. ASSESSMENT AND PLAN:   70 year old female patient with history of anxiety disorder,  arthritis, GERD, hiatal hernia, sepsis, wound infection, MRSA bacteremia presented to the emergency room with increased shortness of breath from [place] facility  1.Acute hypoxic respiratory failure secondary to HCAP. 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  continue IV antibiotics, follow cultures, nebulizers and O2 therapy as needed.  3.Normocytic anemia - monitor CBC  4.Hypertension - continue home medications  5.History of MRSA bacteremia and wound infection - reculture wound given its appearance and consult wound care. Wound cultures of Pseudomonas. Changed to IV vancomycin and cefepime. Patient will be discharged on IV vancomycin and PO ciprofloxacin.  6. E coli-Urinary tract infection ON IV abx  7. Chronic low back pain On pain medications  All the records are reviewed and case discussed with Care Management/Social Worker. Management plans discussed with the patient, family and they are in agreement.  CODE STATUS: Full  TOTAL TIME TAKING CARE OF THIS PATIENT: 30 minutes.   POSSIBLE D/C IN 2-3 DAYS, DEPENDING ON CLINICAL CONDITION.  Hillary Bow R M.D. on 08/22/2016 at 11:35 AM  Between 7am to 6pm - Pager - [phone]  After 6pm go to [email or web address] - Proofreader  [facility]  Office  [phone]  CC: Primary care physician; Golden Pop, MD  Note: This dictation was prepared with Dragon dictation along with smaller phrase technology. Any transcriptional errors that result from this process are unintentional.

## 2016-08-22 NOTE — Progress Notes (Signed)
Physical Therapy Treatment Patient Details Name: Tracy Espinoza MRN: [id number] DOB: [birth year] Today's Date: 08/22/2016    History of Present Illness HISTORY OF PRESENT ILLNESS: Tracy Espinoza  is a 70 y.o. female with a known history of Anemia, hypertension, wound infection, hiatal hernia, hypertension, GERD, recent lumbar surgery with MRSA bacteremia presented to the emergency room with increased shortness of breath. Patient was referred from [place] as he was short of breath and her O2 sats on room air were 57%. She was put on oxygen via nonrebreather mask and was brought to the emergency room. Patient was put on BiPAP initially and stabilized. Patient had extensive hospitalization at [facility] and she had a recent lumbar surgery with wound infection. Patient underwent a debridement and irrigation and was treated with IV antibiotics and PICC line was placed and was sent to [place]. Patient currently on IV daptomycin antibiotic. She had low-grade fever and chills and was short of breath and O2 sats were dropped. She was brought to the emergency room and she was worked up and found to have pneumonia. Patient was weaned off BiPAP and put on oxygen via nasal cannula at 5 L in the emergency room. Case was discussed with the intensivist by ER physician who recommended IV cefepime and IV Zyvox antibiotics and stepdown unit. Patient awake and alert and responds to all verbal commands.    PT Comments    Pt is making slow progress towards goals with improved ambulation performed this date. Pt able to recite all back precautions and improved endurance with there-ex. Pt still remains very motivated to perform therapy. Limited in endurance by O2. Sats decrease quickly with all exertion, needs heavy cues to maintain pursed lip breathing. Needs slight assist to don brace and used for mobility. Slow speed during ambulation.  Follow Up Recommendations  SNF     Equipment  Recommendations  None recommended by PT    Recommendations for Other Services       Precautions / Restrictions Precautions Precautions: Back;Fall Precaution Booklet Issued: No Precaution Comments: reviewed spinal precautions. NO bending lifting, or twisting. Pt reports she was told by her surgeon that she is required to use the back brace for 12 weeks post-operatively. Has brace in room Required Braces or Orthoses: Spinal Brace Spinal Brace: Lumbar corset;Applied in sitting position Restrictions Weight Bearing Restrictions: No    Mobility  Bed Mobility Overal bed mobility: Needs Assistance Bed Mobility: Supine to Sit     Supine to sit: Min assist     General bed mobility comments: assist for bed mobility including initiating movement towards R side of bed. Once seated at EOB, pt able to don brace with min assist. Safe technique performed  Transfers Overall transfer level: Needs assistance Equipment used: Rolling walker (2 wheeled) Transfers: Sit to/from Stand Sit to Stand: Min assist         General transfer comment: Pt stood from elevated bed with safe technique, 1 cue for pushing from seated surface. Once standing, pt able to demonstrate upright posture. 4L of O2 donned for all mobility.  Ambulation/Gait Ambulation/Gait assistance: Min guard Ambulation Distance (Feet): 40 Feet Assistive device: Rolling walker (2 wheeled) Gait Pattern/deviations: Step-through pattern     General Gait Details: ambulated with reciprocal gait pattern and slow technique. Pt cued for pursed lip breathing during all mobility. 4L of O2 donned, sats decreasing to 87% with exertion.    Stairs  Wheelchair Mobility    Modified Rankin (Stroke Patients Only)       Balance                                    Cognition Arousal/Alertness: Awake/alert Behavior During Therapy: WFL for tasks assessed/performed Overall Cognitive Status: Within Functional Limits  for tasks assessed                      Exercises Other Exercises Other Exercises: Supine ther-ex performed x 12 reps including B LE SLRs, hip abd/add, quad sets, and glut sets. All ther-ex performed with safe technique     General Comments        Pertinent Vitals/Pain Pain Assessment: 0-10 Pain Score: 6  Pain Location: back Pain Descriptors / Indicators: Operative site guarding Pain Intervention(s): Limited activity within patient's tolerance;Patient requesting pain meds-RN notified    Home Living                      Prior Function            PT Goals (current goals can now be found in the care plan section) Acute Rehab PT Goals Patient Stated Goal: Return to living independently at home. Decrease pain and improve function PT Goal Formulation: With patient Time For Goal Achievement: 09/03/16 Potential to Achieve Goals: Good Progress towards PT goals: Progressing toward goals    Frequency    7X/week      PT Plan Current plan remains appropriate    Co-evaluation             End of Session Equipment Utilized During Treatment: Gait belt;Oxygen;Back brace Activity Tolerance: Patient tolerated treatment well Patient left: in chair;with call bell/phone within reach     Time: 0923-1002 PT Time Calculation (min) (ACUTE ONLY): 39 min  Charges:  $Gait Training: 23-37 mins $Therapeutic Exercise: 8-22 mins                    G Codes:      Tracy Espinoza 09/17/2016, 11:54 AM  Greggory Stallion, PT, DPT [phone]

## 2016-08-22 NOTE — Progress Notes (Signed)
Pharmacy Antibiotic Note  Tracy Espinoza is a 70 y.o. female admitted on 08/19/2016 with pneumonia.  Pharmacy has been consulted for cefepime dosing. Vancomycin consult 11/20.  Per vancomycin consult from ID MD: She has a hx of what sounds like mild phlebitis while getting IV through peripheral line- please rechallenge now that has picc  Plan: Vancomycin 1500mg  IV x1 then 1500mg  IV q18h x1 given with stacked dosing, will increase frequency of dosing to vancomycin 1250mg  q12h with a goal trough 15-34mcg/ml for osteomyelitis. Trough after the 5th dose will not be at ss with new dosing regimen. Will check trough 11/23 @ 5:30 to determine if accumulation. RN was called on 11/20 to discuss pt's previous "allergy" to vanc, informed her that we could slow the rate of vanc down if needed and RN stated she will keep [place] informed.    Continue Cefepime 2 g IV q8 hours.  Height: 5\' 2"  (157.5 cm) Weight: 297 lb (134.7 kg) IBW/kg (Calculated) : 50.1  Temp (24hrs), Avg:98 F (36.7 C), Min:97.6 F (36.4 C), Max:98.2 F (36.8 C)   Recent Labs Lab 08/19/16 0055 08/19/16 2231 08/19/16 2302 08/20/16 0506 08/21/16 0533 08/22/16 0543  WBC 13.3* 11.7*  --  11.0 10.7 8.7  CREATININE 0.91 0.75  --  0.73 0.78 0.62  LATICACIDVEN 1.5  --  0.9  --   --   --     Estimated Creatinine Clearance: 85.4 mL/min (by C-G formula based on SCr of 0.62 mg/dL).    Allergies  Allergen Reactions  . Tape Itching, Dermatitis and Rash  . 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 Band-Aid Plus Antibiotic [Bacitracin-Polymyxin B] Other (See Comments)    Unknown  . Iodinated Diagnostic Agents Itching  . Morphine Sulfate Itching  . Vancomycin Rash    Antimicrobials this admission: Cefepime 11/18 >> Linezolid 11/18 >> 11/20 Vanc 11/20>>   Microbiology results: 11/18 BCx: NGTD x2 days 11/18 UCx: >100k EColi   11/18 MRSA PCR: neg 11/18 WCx mod PSA sens cefepime    11/18  UA: pending 11/18 CXR upper lobe consolidation L>R  Thank you for allowing pharmacy to be a part of this patient's care.  Loree Fee, PharmD 08/22/2016 9:10 AM

## 2016-08-22 NOTE — Progress Notes (Addendum)
Per MD progress note today "Patient will be discharged on IV vancomycin and PO ciprofloxacin." Per [facility] liaison they can accept patient with that IV Abx. Clinical Social Worker (CSW) contacted patient's daughter [name] and made her aware of above. Daughter accepted bed offer from [facility]. [facility] is aware of accepted bed offer. CSW will continue to follow and assist as needed.  CSW gave [facility] IV ABX orders.    [employer], LCSW [phone]

## 2016-08-22 NOTE — Progress Notes (Signed)
Infectious Disease Long Term IV Antibiotic Orders  Diagnosis:  MRSA bacteremia and lumbar post op abscess  Culture results MRSA, Pseudomonas  Allergies:  Allergies  Allergen Reactions  . Tape Itching, Dermatitis and Rash  . 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 Band-Aid Plus Antibiotic [Bacitracin-Polymyxin B] Other (See Comments)    Unknown  . Iodinated Diagnostic Agents Itching  . Morphine Sulfate Itching  . Vancomycin Rash   Lab Results  Component Value Date   CREATININE 0.62 08/22/2016   Lab Results  Component Value Date   WBC 8.7 08/22/2016   HGB 9.0 (L) 08/22/2016   HCT 27.7 (L) 08/22/2016   MCV 87.8 08/22/2016   PLT 325 08/22/2016   Discharge antibiotics Vancomycin          1250         mg  every         12      hours .     Goal vancomycin trough 15-20.    Pharmacy to adjust dosing based on levels  PICC Care per protocol Labs weekly while on IV antibiotics      CBC w diff   Comprehensive met panel Vancomycin Trough   CRP, ESR   Planned duration of antibiotics 3 weeks  Stop date 09/15/16  Follow up clinic date 2 weelks FAX weekly labs to [phone]  Leonel Ramsay, MD

## 2016-08-22 NOTE — Care Management (Signed)
Patient open to [facility] for SN.

## 2016-08-22 NOTE — Progress Notes (Signed)
On assessment patient complaining of no pain. Patient lung and heart sounds within defined limits. Patient on 4L O2 and desaturates when ambulating at times. Patient no complaining of any shortness of breath. Patient foam dressing on lower back changed by night nurse. Incision site dry with some scabbing. Patient had IV abx in right PICC. Patient 1 assist with walker to bathroom.  Deri Fuelling, RN

## 2016-08-23 LAB — C-REACTIVE PROTEIN: CRP: 5.3 mg/dL — AB (ref <1.0)

## 2016-08-23 LAB — SEDIMENTATION RATE: Sed Rate: 85 mm/hr — ABNORMAL HIGH (ref 0–30)

## 2016-08-23 MED ORDER — OXYCODONE HCL 10 MG PO TABS: 10.0000 mg | ORAL_TABLET | Freq: Three times a day (TID) | ORAL | 0 refills | Status: DC

## 2016-08-23 MED ORDER — CIPROFLOXACIN HCL 500 MG PO TABS: 500.0000 mg | ORAL_TABLET | Freq: Two times a day (BID) | ORAL | 0 refills | Status: DC

## 2016-08-23 MED ORDER — METOPROLOL SUCCINATE ER 25 MG PO TB24: 25.0000 mg | ORAL_TABLET | Freq: Every day | ORAL | 0 refills | Status: DC

## 2016-08-23 MED ORDER — LORAZEPAM 0.5 MG PO TABS: 1.0000 mg | ORAL_TABLET | Freq: Three times a day (TID) | ORAL | 0 refills | Status: DC | PRN

## 2016-08-23 MED ORDER — OXYCODONE-ACETAMINOPHEN 5-325 MG PO TABS: 1.0000 | ORAL_TABLET | Freq: Four times a day (QID) | ORAL | 0 refills | Status: DC | PRN

## 2016-08-23 MED ORDER — SODIUM CHLORIDE 0.9 % IV SOLN: 1250.0000 mg | Freq: Two times a day (BID) | INTRAVENOUS | Status: DC

## 2016-08-23 NOTE — Discharge Summary (Signed)
[facility] [place] NAME: Tracy Espinoza    MR#:  [id number]  DATE OF BIRTH:  August 19, 1946  DATE OF ADMISSION:  08/19/2016 ADMITTING PHYSICIAN: Saundra Shelling, MD  DATE OF DISCHARGE: Planned for 08/24/2016  PRIMARY CARE PHYSICIAN: Golden Pop, MD   ADMISSION DIAGNOSIS:  Shortness of breath [R06.02] Respiratory distress [R06.00] Hypoxia [R09.02] MRSA (methicillin resistant staph aureus) culture positive [Z22.322] Acute cystitis without hematuria [N30.00] HCAP (healthcare-associated pneumonia) [J18.9]  DISCHARGE DIAGNOSIS:  Principal Problem:   Hypoxia Active Problems:   Healthcare-associated pneumonia   HCAP (healthcare-associated pneumonia)   Acute respiratory failure ([place])   SECONDARY DIAGNOSIS:   Past Medical History:  Diagnosis Date  . Anxiety   . Arthritis   . Cancer ([place])    BASAL CELL SKIN CANCERS REMOVED  . GERD (gastroesophageal reflux disease)   . History of hiatal hernia   . Hypertension      ADMITTING HISTORY  HISTORY OF PRESENT ILLNESS: Tracy Espinoza  is a 70 y.o. female with a known history of Anemia, hypertension, wound infection, hiatal hernia, hypertension, GERD, recent lumbar surgery with MRSA bacteremia presented to the emergency room with increased shortness of breath. Patient was referred from [place] as he was short of breath and her O2 sats on room air were 57%. She was put on oxygen via nonrebreather mask and was brought to the emergency room. Patient was put on BiPAP initially and stabilized. Patient had extensive hospitalization at [facility] and she had a recent lumbar surgery with wound infection. Patient underwent a debridement and irrigation and was treated with IV antibiotics and PICC line was placed and was sent to [place]. Patient currently on IV daptomycin antibiotic. She had low-grade fever and chills and was short of breath and O2 sats were dropped. She was  brought to the emergency room and she was worked up and found to have pneumonia. Patient was weaned off BiPAP and put on oxygen via nasal cannula at 5 L in the emergency room. Case was discussed with the intensivist by ER physician who recommended IV cefepime and IV Zyvox antibiotics and stepdown unit. Patient awake and alert and responds to all verbal commands. Blood pressure was stable.  HOSPITAL COURSE:   70 year old female patient with history of anxiety disorder, arthritis, GERD, hiatal hernia, sepsis, wound infection, MRSA bacteremia presented to the emergency room with increased shortness of breath from [place] facility  1.Acute hypoxic respiratory failure secondary to HCAP. 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  continued on IV antibiotics, follow cultures, nebulizers and O2 therapy as needed. Weaning O2 and now on 4 L O2. Wean as tolerated to keeps sats >90%  3.Normocytic anemia - monitor CBC  4.Hypertension - continue home medications  5.History of MRSA bacteremia and wound infection - reculture wound given its appearance and consult wound care. Wound cultures have Pseudomonas. Changed to IV vancomycin and cefepime. Patient will be discharged on IV vancomycin and PO ciprofloxacin Till 09/15/2016 as per Dr. Ola Spurr  6. E coli-Urinary tract infection ON IV abx. Change to Ciprofloaxcin PO at discharge  7. Chronic low back pain On pain medications  CONSULTS OBTAINED:  Treatment Team:  Wilhelmina Mcardle, MD Leonel Ramsay, MD  DRUG ALLERGIES:   Allergies  Allergen Reactions  . Tape Itching, Dermatitis and Rash  . 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 Band-Aid Plus Antibiotic [Bacitracin-Polymyxin B] Other (See  Comments)    Unknown  . Iodinated Diagnostic Agents Itching  . Morphine Sulfate Itching  . Vancomycin Rash    DISCHARGE MEDICATIONS:   Current Discharge  Medication List    START taking these medications   Details  ciprofloxacin (CIPRO) 500 MG tablet Take 1 tablet (500 mg total) by mouth 2 (two) times daily. Qty: 42 tablet, Refills: 0    metoprolol succinate (TOPROL-XL) 25 MG 24 hr tablet Take 1 tablet (25 mg total) by mouth daily. Qty: 30 tablet, Refills: 0    vancomycin 1,250 mg in sodium chloride 0.9 % 250 mL Inject 1,250 mg into the vein every 12 (twelve) hours.      CONTINUE these medications which have CHANGED   Details  LORazepam (ATIVAN) 0.5 MG tablet Take 2 tablets (1 mg total) by mouth every 8 (eight) hours as needed for anxiety. Qty: 10 tablet, Refills: 0    Oxycodone HCl 10 MG TABS Take 1 tablet (10 mg total) by mouth every 8 (eight) hours. Qty: 15 tablet, Refills: 0    oxyCODONE-acetaminophen (PERCOCET/ROXICET) 5-325 MG tablet Take 1 tablet by mouth every 6 (six) hours as needed for moderate pain or severe pain. Breakthrough pain Qty: 15 tablet, Refills: 0      CONTINUE these medications which have NOT CHANGED   Details  acetaminophen (TYLENOL) 325 MG tablet Take 650 mg by mouth every 6 (six) hours as needed for mild pain.    acidophilus (RISAQUAD) CAPS capsule Take 1 capsule by mouth daily.    ARTIFICIAL TEARS 0.1-0.3 % SOLN Place 1 drop into both eyes daily as needed (dry eyes).    Ascorbic Acid (VITAMIN C) 1000 MG tablet Take 1,000 mg by mouth daily.    aspirin EC 81 MG tablet Take 81 mg by mouth daily.    Calcium Carb-Cholecalciferol (CALCIUM 600+D) 600-800 MG-UNIT TABS Take 1 tablet by mouth daily.     Cholecalciferol (D3-1000) 1000 units capsule Take 1,000 Units by mouth daily.    conjugated estrogens (PREMARIN) vaginal cream Apply a blueberry sized amount to urethra using fingertip nightly x 2 weeks then 3 times weekly at bedtime. Qty: 42.5 g, Refills: 12    CRANBERRY PO Take 1-2 capsules by mouth See admin instructions. Take 1 capsule by mouth in the morning and take 2 capsules by mouth in the evening.     guaiFENesin (ROBITUSSIN) 100 MG/5ML liquid Take 400 mg by mouth every 6 (six) hours.    ipratropium-albuterol (DUONEB) 0.5-2.5 (3) MG/3ML SOLN Take 3 mLs by nebulization every 6 (six) hours.    magnesium oxide (MAG-OX) 400 MG tablet Take 400 mg by mouth 2 (two) times daily.    Melatonin 10 MG CAPS Take 10 mg by mouth at bedtime.     methocarbamol (ROBAXIN) 500 MG tablet Take 1 tablet (500 mg total) by mouth every 6 (six) hours. Qty: 60 tablet, Refills: 0    Multiple Vitamin (MULTIVITAMIN) tablet Take 1 tablet by mouth daily.    omeprazole (PRILOSEC) 40 MG capsule Take 1 capsule (40 mg total) by mouth 2 (two) times daily. Qty: 180 capsule, Refills: 4    polyethylene glycol (MIRALAX / GLYCOLAX) packet Take 17 g by mouth 2 (two) times daily. Qty: 14 each, Refills: 0    polyvinyl alcohol (LIQUIFILM TEARS) 1.4 % ophthalmic solution Place 1 drop into both eyes daily as needed for dry eyes.     senna (SENOKOT) 8.6 MG TABS tablet Take 1 tablet (8.6 mg total) by mouth 2 (two) times  daily. Qty: 120 each, Refills: 0    traZODone (DESYREL) 50 MG tablet Take 1 tablet (50 mg total) by mouth at bedtime. Qty: 30 tablet, Refills: 0    vitamin E (VITAMIN E) 400 UNIT capsule Take 400 Units by mouth daily.      STOP taking these medications     amLODipine (NORVASC) 5 MG tablet         Today   VITAL SIGNS:  Blood pressure 130/60, pulse 78, temperature 98.7 F (37.1 C), temperature source Oral, resp. rate 18, height 5\' 2"  (1.575 m), weight 134.7 kg (297 lb), SpO2 94 %.  I/O:   Intake/Output Summary (Last 24 hours) at 08/23/16 0947 Last data filed at 08/23/16 0100  Gross per 24 hour  Intake              340 ml  Output              250 ml  Net               90 ml    PHYSICAL EXAMINATION:  Physical Exam  GENERAL:  70 y.o.-year-old patient lying in the bed with no acute distress. Obese. LUNGS: Normal breath sounds bilaterally, no wheezing, rales,rhonchi or crepitation. No use of  accessory muscles of respiration.  CARDIOVASCULAR: S1, S2 normal. No murmurs, rubs, or gallops.  ABDOMEN: Soft, non-tender, non-distended. Bowel sounds present. No organomegaly or mass.  NEUROLOGIC: Moves all 4 extremities. PSYCHIATRIC: The patient is alert and oriented x 3.  SKIN: Low back ulcer  DATA REVIEW:   CBC  Recent Labs Lab 08/22/16 0543  WBC 8.7  HGB 9.0*  HCT 27.7*  PLT 325    Chemistries   Recent Labs Lab 08/19/16 0055  08/22/16 0543  NA 135  < > 139  K 3.9  < > 3.9  CL 97*  < > 92*  CO2 32  < > 42*  GLUCOSE 179*  < > 110*  BUN 21*  < > 14  CREATININE 0.91  < > 0.62  CALCIUM 8.2*  < > 8.7*  MG  --   < > 2.0  AST 19  --   --   ALT 10*  --   --   ALKPHOS 56  --   --   BILITOT 0.6  --   --   < > = values in this interval not displayed.  Cardiac Enzymes No results for input(s): TROPONINI in the last 168 hours.  Microbiology Results  Results for orders placed or performed during the hospital encounter of 08/19/16  Blood Culture (routine x 2)     Status: None (Preliminary result)   Collection Time: 08/19/16 12:55 AM  Result Value Ref Range Status   Specimen Description BLOOD LEFT LATERAL  Final   Special Requests   Final    BOTTLES DRAWN AEROBIC AND ANAEROBIC 10CCAERO,12CCANA   Culture NO GROWTH 4 DAYS  Final   Report Status PENDING  Incomplete  Blood Culture (routine x 2)     Status: None (Preliminary result)   Collection Time: 08/19/16 12:55 AM  Result Value Ref Range Status   Specimen Description BLOOD LEFT HAND  Final   Special Requests BOTTLES DRAWN AEROBIC AND ANAEROBIC [place]  Final   Culture NO GROWTH 4 DAYS  Final   Report Status PENDING  Incomplete  Urine culture     Status: Abnormal   Collection Time: 08/19/16 12:55 AM  Result Value Ref Range Status   Specimen Description  URINE, RANDOM  Final   Special Requests NONE  Final   Culture >=100,000 COLONIES/mL ESCHERICHIA COLI (A)  Final   Report Status 08/21/2016 FINAL  Final    Organism ID, Bacteria ESCHERICHIA COLI (A)  Final      Susceptibility   Escherichia coli - MIC*    AMPICILLIN >=32 RESISTANT Resistant     CEFAZOLIN <=4 SENSITIVE Sensitive     CEFTRIAXONE <=1 SENSITIVE Sensitive     CIPROFLOXACIN 0.5 SENSITIVE Sensitive     GENTAMICIN <=1 SENSITIVE Sensitive     IMIPENEM <=0.25 SENSITIVE Sensitive     NITROFURANTOIN <=16 SENSITIVE Sensitive     TRIMETH/SULFA <=20 SENSITIVE Sensitive     AMPICILLIN/SULBACTAM 4 SENSITIVE Sensitive     PIP/TAZO <=4 SENSITIVE Sensitive     Extended ESBL NEGATIVE Sensitive     * >=100,000 COLONIES/mL ESCHERICHIA COLI  MRSA PCR Screening     Status: None   Collection Time: 08/19/16  8:25 AM  Result Value Ref Range Status   MRSA by PCR NEGATIVE NEGATIVE Final    Comment:        The GeneXpert MRSA Assay (FDA approved for NASAL specimens only), is one component of a comprehensive MRSA colonization surveillance program. It is not intended to diagnose MRSA infection nor to guide or monitor treatment for MRSA infections.   Aerobic/Anaerobic Culture (surgical/deep wound)     Status: None (Preliminary result)   Collection Time: 08/19/16  1:08 PM  Result Value Ref Range Status   Specimen Description BACK  Final   Special Requests NONE  Final   Gram Stain   Final    FEW WBC PRESENT, PREDOMINANTLY PMN MODERATE GRAM NEGATIVE RODS Performed at [facility]    Culture   Final    MODERATE PSEUDOMONAS AERUGINOSA NO ANAEROBES ISOLATED; CULTURE IN PROGRESS FOR 5 DAYS    Report Status PENDING  Incomplete   Organism ID, Bacteria PSEUDOMONAS AERUGINOSA  Final      Susceptibility   Pseudomonas aeruginosa - MIC*    CEFTAZIDIME 4 SENSITIVE Sensitive     CIPROFLOXACIN <=0.25 SENSITIVE Sensitive     GENTAMICIN <=1 SENSITIVE Sensitive     IMIPENEM 2 SENSITIVE Sensitive     PIP/TAZO 8 SENSITIVE Sensitive     CEFEPIME 2 SENSITIVE Sensitive     * MODERATE PSEUDOMONAS AERUGINOSA    RADIOLOGY:  No results found.  Follow  up with PCP in 1 week.  Management plans discussed with the patient, family and they are in agreement.  CODE STATUS:     Code Status Orders        Start     Ordered   08/19/16 0827  Full code  Continuous     08/19/16 0826    Code Status History    Date Active Date Inactive Code Status Order ID Comments User Context   08/03/2016  7:44 PM 08/08/2016  6:43 PM Full Code [id number]  Eustace Moore, MD Inpatient   08/03/2016 12:46 AM 08/03/2016  7:44 PM Full Code [id number]  Rise Patience, MD ED   07/10/2016  3:36 PM 07/15/2016  2:05 PM Full Code [id number]  Kristeen Miss, MD Inpatient    Advance Directive Documentation   [place] Most Recent Value  Type of Advance Directive  Living will  Pre-existing out of facility DNR order (yellow form or pink MOST form)  No data  "MOST" Form in Place?  No data      TOTAL TIME TAKING CARE OF THIS PATIENT  ON DAY OF DISCHARGE: more than 30 minutes.   Hillary Bow R M.D on 08/23/2016 at 9:47 AM  Between 7am to 6pm - Pager - [phone]  After 6pm go to [email or web address] - password [email or web address] [facility]  Office  [phone]  CC: Primary care physician; Golden Pop, MD  Note: This dictation was prepared with Dragon dictation along with smaller phrase technology. Any transcriptional errors that result from this process are unintentional.

## 2016-08-23 NOTE — Progress Notes (Addendum)
Plan is for patient to D/C to [facility] tomorrow pending medical clearance. Per [facility] liaison patient will go to room 606. RN will call report to [place] at [phone] and arrange EMS for transport. Clinical Education officer, museum (CSW) sent D/C orders to [facility] today via [facility], and faxed prescriptions today. Patient will go with 1 dose of IV Vanc to [facility] because [facility] cannot get it on a holiday. EMS packet is on chart. Charge RN and RN aware of above. Patient and her daughter [name] are aware of above. CSW will continue to follow and assist as needed.   [employer], LCSW [phone]

## 2016-08-23 NOTE — Progress Notes (Signed)
Physical Therapy Treatment Patient Details Name: Tracy Espinoza MRN: [id number] DOB: [birth year] Today's Date: 08/23/2016    History of Present Illness HISTORY OF PRESENT ILLNESS: Tracy Espinoza  is a 70 y.o. female with a known history of Anemia, hypertension, wound infection, hiatal hernia, hypertension, GERD, recent lumbar surgery with MRSA bacteremia presented to the emergency room with increased shortness of breath. Patient was referred from [facility] as he was short of breath and her O2 sats on room air were 57%. She was put on oxygen via nonrebreather mask and was brought to the emergency room. Patient was put on BiPAP initially and stabilized. Patient had extensive hospitalization at [facility] and she had a recent lumbar surgery with wound infection. Patient underwent a debridement and irrigation and was treated with IV antibiotics and PICC line was placed and was sent to [place]. Patient currently on IV daptomycin antibiotic. She had low-grade fever and chills and was short of breath and O2 sats were dropped. She was brought to the emergency room and she was worked up and found to have pneumonia. Patient was weaned off BiPAP and put on oxygen via nasal cannula at 5 L in the emergency room. Case was discussed with the intensivist by ER physician who recommended IV cefepime and IV Zyvox antibiotics and stepdown unit. Patient awake and alert and responds to all verbal commands.    PT Comments    Pt is making good progress towards goals with improved ambulation this date, however continues to be limited by O2 sats with sats decreasing with all mobility. Pt very motivated to perform therapy and is doing well with there-ex. All mobility performed on 5L of O2 at this time. Brace donned for all mobility.  Follow Up Recommendations  SNF     Equipment Recommendations  None recommended by PT    Recommendations for Other Services Rehab consult     Precautions  / Restrictions Precautions Precautions: Back;Fall Precaution Booklet Issued: No Required Braces or Orthoses: Spinal Brace Spinal Brace: Lumbar corset;Applied in sitting position Restrictions Weight Bearing Restrictions: No    Mobility  Bed Mobility               General bed mobility comments: receiving OOB in bathroom, not performed  Transfers Overall transfer level: Needs assistance Equipment used: Rolling walker (2 wheeled) Transfers: Sit to/from Stand Sit to Stand: Min assist         General transfer comment: assist from lower surface given with therapist support. Once standing, safe technique performed and no LOB noted  Ambulation/Gait Ambulation/Gait assistance: Min guard Ambulation Distance (Feet): 80 Feet Assistive device: Rolling walker (2 wheeled) Gait Pattern/deviations: Step-through pattern     General Gait Details: ambulated in 2 bout with 1 prolonged rest break secondary to poor endurance and O2 sats decreasing to 74%. Pt requires cues for pursed lip breathing with seated rest break. Increased back pain noted with ambulation. On 2nd bout of ambulation, sats decreased to 84%.   Stairs            Wheelchair Mobility    Modified Rankin (Stroke Patients Only)       Balance                                    Cognition Arousal/Alertness: Awake/alert Behavior During Therapy: WFL for tasks assessed/performed Overall Cognitive Status: Within Functional Limits for tasks assessed  Exercises Other Exercises Other Exercises: Seated ther-ex performed x 15 reps including B ankle pumps, glut sets, quad sets, hip abd/add, LAQ, and hip add squeezes. All ther-ex performed with cues for pursed lip breathing. CGA given    General Comments        Pertinent Vitals/Pain Pain Assessment: 0-10 Pain Score: 8  Pain Location: low back with referral pain to groin Pain Descriptors / Indicators: Operative site  guarding Pain Intervention(s): Limited activity within patient's tolerance;Patient requesting pain meds-RN notified    Home Living                      Prior Function            PT Goals (current goals can now be found in the care plan section) Acute Rehab PT Goals Patient Stated Goal: Return to living independently at home. Decrease pain and improve function PT Goal Formulation: With patient Time For Goal Achievement: 09/03/16 Potential to Achieve Goals: Good Progress towards PT goals: Progressing toward goals    Frequency    7X/week      PT Plan Current plan remains appropriate    Co-evaluation             End of Session Equipment Utilized During Treatment: Gait belt;Oxygen;Back brace Activity Tolerance: Patient tolerated treatment well Patient left: in chair;with call bell/phone within reach     Time: 1004-1035 PT Time Calculation (min) (ACUTE ONLY): 31 min  Charges:  $Gait Training: 8-22 mins $Therapeutic Exercise: 8-22 mins                    G Codes:      Hortence Charter August 24, 2016, 1:04 PM  Greggory Stallion, PT, DPT [phone]

## 2016-08-23 NOTE — Progress Notes (Signed)
Patient ID: Tracy Espinoza, female   DOB: 1946/09/06, 70 y.o.   MRN: [id number]   [place] [facility] NAME: Tracy Espinoza    MR#:  [id number]  DATE OF BIRTH:  [birth year]  SUBJECTIVE:  CHIEF COMPLAINT:   Chief Complaint  Patient presents with  . Shortness of Breath   She is a 70 year old female with history of anxiety disorder, arthritis, GERD, hiatal hernia, sepsis, wound infection, MRSA bacteremia who is admitted with up acute hypoxic Respiratory failure secondary to bilateral pneumonia, anemia, hypertension, MRSA bacteremia and wound infection and urinary tract infection. Patient has a complicated history significant for spinal stenosis surgery in the spring of 0000000 which is complicated by MRSA wound infection and prolonged stay in rehabilitation. She was admitted to stepdown started on IV cefepime and Zyvox, with blood, urine and wound cultures pending.  SOB better. On 4 L O2  REVIEW OF SYSTEMS:  ROS  CONSTITUTIONAL:  hasweakness.  EYES: No blurred or double vision.  EARS, NOSE, AND THROAT: No tinnitus or ear pain.  RESPIRATORY: [place], shortness of breath, , dyspnea on exertion no wheezing or hemoptysis.  CARDIOVASCULAR: No chest pain, orthopnea, has edema.  GASTROINTESTINAL: No nausea, vomiting, diarrhea or abdominal pain.  GENITOURINARY: No dysuria, hematuria.  ENDOCRINE: No polyuria, nocturia,  HEMATOLOGY: No anemia, easy bruising or bleeding SKIN: No rash or lesion. MUSCULOSKELETAL: No joint pain or arthritis. Positive for low back pain NEUROLOGIC: No tingling, numbness, weakness.  PSYCHIATRY: No anxiety or depression.   DRUG ALLERGIES:   Allergies  Allergen Reactions  . Tape Itching, Dermatitis and Rash  . 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 Band-Aid Plus Antibiotic [Bacitracin-Polymyxin B] Other (See Comments)    Unknown  . Iodinated Diagnostic Agents Itching  . Morphine  Sulfate Itching  . Vancomycin Rash   VITALS:  Blood pressure 130/60, pulse 78, temperature 98.7 F (37.1 C), temperature source Oral, resp. rate 18, height 5\' 2"  (1.575 m), weight 134.7 kg (297 lb), SpO2 94 %. PHYSICAL EXAMINATION:  Physical Exam  GENERAL: 70 y.o.-year-old obesepatient lying in the bed. Obese EYES: Pupils equal, round, reactive to light and accommodation. No scleral icterus. Extraocular muscles intact.  HEENT: Head atraumatic, normocephalic. Oropharynx and nasopharynx clear.  NECK: Supple, no jugular venous distention. No thyroid enlargement, no tenderness.  LUNGS: Decreasedbreath sounds bilaterally, rales heard bilaterally in both lungs.No use of accessory muscles of respiration.  CARDIOVASCULAR: S1, S2 normal. No murmurs, rubs, or gallops.  ABDOMEN: Soft, obese,nontender, nondistended. Bowel sounds present. No organomegaly or mass.  EXTREMITIES: haspedal edema,nocyanosis, or clubbing.  Trunk : Back reveals midline surgical scar. NEUROLOGIC: Cranial nerves II through XII are intact. Moves all 4 extremities symmetrically PSYCHIATRIC: The patient is alert and oriented x 3.  LABORATORY PANEL:   CBC  Recent Labs Lab 08/22/16 0543  WBC 8.7  HGB 9.0*  HCT 27.7*  PLT 325   ------------------------------------------------------------------------------------------------------------------ Chemistries   Recent Labs Lab 08/19/16 0055  08/22/16 0543  NA 135  < > 139  K 3.9  < > 3.9  CL 97*  < > 92*  CO2 32  < > 42*  GLUCOSE 179*  < > 110*  BUN 21*  < > 14  CREATININE 0.91  < > 0.62  CALCIUM 8.2*  < > 8.7*  MG  --   < > 2.0  AST 19  --   --   ALT 10*  --   --  ALKPHOS 56  --   --   BILITOT 0.6  --   --   < > = values in this interval not displayed. RADIOLOGY:  No results found. ASSESSMENT AND PLAN:   70 year old female patient with history of anxiety disorder, arthritis, GERD, hiatal hernia, sepsis, wound infection, MRSA bacteremia presented to the  emergency room with increased shortness of breath from [facility] facility  1.Acute hypoxic respiratory failure secondary to HCAP. 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  continued on IV antibiotics, follow cultures, nebulizers and O2 therapy as needed. Weaning O2 and now on 4 L O2. Wean as tolerated to keeps sats >90%  3.Normocytic anemia - monitor CBC  4.Hypertension - continue home medications  5.History of MRSA bacteremia and wound infection - reculture wound given its appearance and consult wound care. Wound cultures have Pseudomonas. Changed to IV vancomycin and cefepime. Patient will be discharged on IV vancomycin and PO ciprofloxacin Till 09/15/2016 as per Dr. Ola Espinoza  6. E coli-Urinary tract infection ON IV abx. Change to Ciprofloaxcin PO at discharge  7. Chronic low back pain On pain medications  All the records are reviewed and case discussed with Care Management/Social Worker. Management plans discussed with the patient, family and they are in agreement.  CODE STATUS: Full  TOTAL TIME TAKING CARE OF THIS PATIENT: 30 minutes.   POSSIBLE D/C IN 2-3 DAYS, DEPENDING ON CLINICAL CONDITION.  Tracy Espinoza R M.D. on 08/23/2016 at 9:44 AM  Between 7am to 6pm - Pager - [phone]   After 6pm go to [email or web address] - Proofreader  [facility]  Office  [phone]  CC: Primary care physician; Tracy Pop, MD  Note: This dictation was prepared with Dragon dictation along with smaller phrase technology. Any transcriptional errors that result from this process are unintentional.

## 2016-08-23 NOTE — Progress Notes (Signed)
[facility] INF[facility] PROGRESS NOTE Date of Admission:  08/19/2016     ID: Tracy Espinoza is a 70 y.o. female with MRSA bacteremia, post op lumbar infection and daptomycin eosinophilic PNA Principal Problem:   Hypoxia Active Problems:   Healthcare-associated pneumonia   HCAP (healthcare-associated pneumonia)   Acute respiratory failure (HCC)   Subjective: Feels stronger . No fevers.   ROS  Eleven systems are reviewed and negative except per hpi  Medications:  Antibiotics Given (last 72 hours)    Date/Time Action Medication Dose Rate   08/20/16 1436 Given   ceFEPIme (MAXIPIME) 2 GM / 33mL IVPB premix 2 g 100 mL/hr   08/20/16 2221 Given   ceFEPIme (MAXIPIME) 2 GM / 68mL IVPB premix 2 g 100 mL/hr   08/21/16 [id number] Given   ceFEPIme (MAXIPIME) 2 GM / 52mL IVPB premix 2 g 100 mL/hr   08/21/16 1636 Given   ceFEPIme (MAXIPIME) 2 GM / 67mL IVPB premix 2 g 100 mL/hr   08/21/16 1701 Given   vancomycin (VANCOCIN) 1,500 mg in sodium chloride 0.9 % 500 mL IVPB 1,500 mg 250 mL/hr   08/21/16 2206 Given   ceFEPIme (MAXIPIME) 2 GM / 49mL IVPB premix 2 g 100 mL/hr   08/22/16 0538 Given   ceFEPIme (MAXIPIME) 2 GM / 50mL IVPB premix 2 g 100 mL/hr   08/22/16 0539 Given   vancomycin (VANCOCIN) 1,500 mg in sodium chloride 0.9 % 500 mL IVPB 1,500 mg 250 mL/hr   08/22/16 1540 Given   ceFEPIme (MAXIPIME) 2 GM / 61mL IVPB premix 2 g 100 mL/hr   08/22/16 1809 Given   vancomycin (VANCOCIN) 1,250 mg in sodium chloride 0.9 % 250 mL IVPB 1,250 mg 166.7 mL/hr   08/22/16 2225 Given   ceFEPIme (MAXIPIME) 2 GM / 35mL IVPB premix 2 g 100 mL/hr   08/23/16 [id number] Given   ceFEPIme (MAXIPIME) 2 GM / 87mL IVPB premix 2 g 100 mL/hr   08/23/16 [id number] Given   vancomycin (VANCOCIN) 1,250 mg in sodium chloride 0.9 % 250 mL IVPB 1,250 mg 166.7 mL/hr     . acidophilus  1 capsule Oral Daily  . aspirin EC  81 mg Oral Daily  . calcium carbonate  1 tablet Oral Daily  . ceFEPIme  2 g Intravenous Q8H  .  cholecalciferol  1,000 Units Oral Daily  . enoxaparin (LOVENOX) injection  40 mg Subcutaneous Q12H  . ipratropium-albuterol  3 mL Nebulization BID  . magnesium oxide  400 mg Oral BID  . mouth rinse  15 mL Mouth Rinse BID  . methocarbamol  500 mg Oral Q6H  . metoprolol succinate  25 mg Oral Daily  . multivitamin with minerals  1 tablet Oral Daily  . pantoprazole  40 mg Oral Daily  . polyethylene glycol  17 g Oral BID  . senna  1 tablet Oral BID  . sodium chloride flush  10-40 mL Intracatheter Q12H  . sodium chloride flush  3 mL Intravenous Q12H  . traZODone  50 mg Oral QHS  . vancomycin  1,250 mg Intravenous Q12H  . vitamin C  1,000 mg Oral Daily  . vitamin E  400 Units Oral Daily    Objective: Vital signs in last 24 hours: Temp:  [97.7 F (36.5 C)-98.7 F (37.1 C)] 97.8 F (36.6 C) (11/22 1119) Pulse Rate:  [67-82] 67 (11/22 1119) Resp:  [18-20] 18 (11/22 0715) BP: (128-145)/(55-68) 128/68 (11/22 1119) SpO2:  [93 %-100 %] 93 % (11/22 1119) Constitutional:  oriented to person, place, and time. appears well-developed and well-nourished. No distress. morbidly HENT: [place]/AT, PERRLA, no scleral icterus Mouth/Throat: Oropharynx is clear and moist. No oropharyngeal exudate.  Cardiovascular: Normal rate, regular rhythm and normal heart sounds. Exam reveals no gallop and no friction rub.  No murmur heard.  Pulmonary/Chest: poor air movement Neck supple, no nuchal rigidity Abdominal: Soft. Bowel sounds are normal.  exhibits no distension. There is no tenderness.  Lymphadenopathy: no cervical adenopathy. No axillary adenopathy Neurological: alert and oriented to person, place, and time.  Skin: Lumbar incision is intact except for an area midline with with an open mild eschar. There is no significant drainage.  Psychiatric: a normal mood and affect.  behavior is normal.    Lab Results  Recent Labs  08/21/16 0533 08/22/16 0543  WBC 10.7 8.7  HGB 9.1* 9.0*  HCT 27.7* 27.7*  NA  136 139  K 3.4* 3.9  CL 87* 92*  CO2 43* 42*  BUN 16 14  CREATININE 0.78 0.62    Microbiology: Results for orders placed or performed during the hospital encounter of 08/19/16  Blood Culture (routine x 2)     Status: None (Preliminary result)   Collection Time: 08/19/16 12:55 AM  Result Value Ref Range Status   Specimen Description BLOOD LEFT LATERAL  Final   Special Requests   Final    BOTTLES DRAWN AEROBIC AND ANAEROBIC 10CCAERO,12CCANA   Culture NO GROWTH 4 DAYS  Final   Report Status PENDING  Incomplete  Blood Culture (routine x 2)     Status: None (Preliminary result)   Collection Time: 08/19/16 12:55 AM  Result Value Ref Range Status   Specimen Description BLOOD LEFT HAND  Final   Special Requests BOTTLES DRAWN AEROBIC AND ANAEROBIC [place]  Final   Culture NO GROWTH 4 DAYS  Final   Report Status PENDING  Incomplete  Urine culture     Status: Abnormal   Collection Time: 08/19/16 12:55 AM  Result Value Ref Range Status   Specimen Description URINE, RANDOM  Final   Special Requests NONE  Final   Culture >=100,000 COLONIES/mL ESCHERICHIA COLI (A)  Final   Report Status 08/21/2016 FINAL  Final   Organism ID, Bacteria ESCHERICHIA COLI (A)  Final      Susceptibility   Escherichia coli - MIC*    AMPICILLIN >=32 RESISTANT Resistant     CEFAZOLIN <=4 SENSITIVE Sensitive     CEFTRIAXONE <=1 SENSITIVE Sensitive     CIPROFLOXACIN 0.5 SENSITIVE Sensitive     GENTAMICIN <=1 SENSITIVE Sensitive     IMIPENEM <=0.25 SENSITIVE Sensitive     NITROFURANTOIN <=16 SENSITIVE Sensitive     TRIMETH/SULFA <=20 SENSITIVE Sensitive     AMPICILLIN/SULBACTAM 4 SENSITIVE Sensitive     PIP/TAZO <=4 SENSITIVE Sensitive     Extended ESBL NEGATIVE Sensitive     * >=100,000 COLONIES/mL ESCHERICHIA COLI  MRSA PCR Screening     Status: None   Collection Time: 08/19/16  8:25 AM  Result Value Ref Range Status   MRSA by PCR NEGATIVE NEGATIVE Final    Comment:        The GeneXpert MRSA Assay  (FDA approved for NASAL specimens only), is one component of a comprehensive MRSA colonization surveillance program. It is not intended to diagnose MRSA infection nor to guide or monitor treatment for MRSA infections.   Aerobic/Anaerobic Culture (surgical/deep wound)     Status: None (Preliminary result)   Collection Time: 08/19/16  1:08 PM  Result  Value Ref Range Status   Specimen Description BACK  Final   Special Requests NONE  Final   Gram Stain   Final    FEW WBC PRESENT, PREDOMINANTLY PMN MODERATE GRAM NEGATIVE RODS Performed at [facility]    Culture   Final    MODERATE PSEUDOMONAS AERUGINOSA NO ANAEROBES ISOLATED; CULTURE IN PROGRESS FOR 5 DAYS    Report Status PENDING  Incomplete   Organism ID, Bacteria PSEUDOMONAS AERUGINOSA  Final      Susceptibility   Pseudomonas aeruginosa - MIC*    CEFTAZIDIME 4 SENSITIVE Sensitive     CIPROFLOXACIN <=0.25 SENSITIVE Sensitive     GENTAMICIN <=1 SENSITIVE Sensitive     IMIPENEM 2 SENSITIVE Sensitive     PIP/TAZO 8 SENSITIVE Sensitive     CEFEPIME 2 SENSITIVE Sensitive     * MODERATE PSEUDOMONAS AERUGINOSA    Studies/Results: No results found.  Assessment/Plan: AREESHA TONKINSON is a 70 y.o. female with complicated hx of post op Lumbar spine infection with I and D culture growing MRSA and peptostreptococcus, prior MRSA bacteremia, now with multifocal PNA, repeat wound cx with Pseudomonas and UCX E coli. She was on daptomycin at a facility at time of admission.  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 She did have mild eosinophilia on admit.  Would need a BAL to prove this but she has never had PNA before per her report had no pulmonary issues prior.  She is also now growing pseudomonas from her wound and will need that treated as well as  coverage of the peptostreptococcus from initial wound.    Recommendations Would NOT restart dapto and I have added this to her allergy list- should not be rechallenged Cont cefepime for now but at dc would place on cipro orally for the pseudomonas on wound which seems superficial. Would give 3 weeks more Since she is tolerating the vancomycin  will need to continue it as otpt for full course - previously until Dec 15th  I discussed this plan with the patient and she has agreed to retry the vancomycin.  I can see in 2-3 weeks and follow closely Thank you very much for the consult. Will follow with you.   [place], DAVID P   08/23/2016, 1:24 PM

## 2016-08-23 NOTE — Progress Notes (Signed)
Pt assisted by NT to the bathroom, c/o being nauseated and in pain thereafter. Pt received Zofran right before shift change per report from morning shift RN. On call MD Hugelmyer paged and ordered for PRN Reglan 5mg  IV every 8hrs. Will administer as ordered and continue to attend to pt's needs.

## 2016-08-24 DIAGNOSIS — R41841 Cognitive communication deficit: Secondary | ICD-10-CM | POA: Diagnosis not present

## 2016-08-24 DIAGNOSIS — Z5189 Encounter for other specified aftercare: Secondary | ICD-10-CM | POA: Diagnosis not present

## 2016-08-24 DIAGNOSIS — T8463XA Infection and inflammatory reaction due to internal fixation device of spine, initial encounter: Secondary | ICD-10-CM | POA: Diagnosis not present

## 2016-08-24 DIAGNOSIS — M48062 Spinal stenosis, lumbar region with neurogenic claudication: Secondary | ICD-10-CM | POA: Diagnosis not present

## 2016-08-24 DIAGNOSIS — T814XXA Infection following a procedure, initial encounter: Secondary | ICD-10-CM | POA: Diagnosis not present

## 2016-08-24 DIAGNOSIS — Z7401 Bed confinement status: Secondary | ICD-10-CM | POA: Diagnosis not present

## 2016-08-24 DIAGNOSIS — F419 Anxiety disorder, unspecified: Secondary | ICD-10-CM | POA: Diagnosis not present

## 2016-08-24 DIAGNOSIS — J9601 Acute respiratory failure with hypoxia: Secondary | ICD-10-CM | POA: Diagnosis not present

## 2016-08-24 DIAGNOSIS — M6281 Muscle weakness (generalized): Secondary | ICD-10-CM | POA: Diagnosis not present

## 2016-08-24 DIAGNOSIS — I25810 Atherosclerosis of coronary artery bypass graft(s) without angina pectoris: Secondary | ICD-10-CM | POA: Diagnosis not present

## 2016-08-24 DIAGNOSIS — B965 Pseudomonas (aeruginosa) (mallei) (pseudomallei) as the cause of diseases classified elsewhere: Secondary | ICD-10-CM | POA: Diagnosis not present

## 2016-08-24 DIAGNOSIS — J189 Pneumonia, unspecified organism: Secondary | ICD-10-CM | POA: Diagnosis not present

## 2016-08-24 DIAGNOSIS — Z792 Long term (current) use of antibiotics: Secondary | ICD-10-CM | POA: Diagnosis not present

## 2016-08-24 DIAGNOSIS — F411 Generalized anxiety disorder: Secondary | ICD-10-CM | POA: Diagnosis not present

## 2016-08-24 DIAGNOSIS — L0390 Cellulitis, unspecified: Secondary | ICD-10-CM | POA: Diagnosis not present

## 2016-08-24 DIAGNOSIS — M545 Low back pain: Secondary | ICD-10-CM | POA: Diagnosis not present

## 2016-08-24 DIAGNOSIS — D649 Anemia, unspecified: Secondary | ICD-10-CM | POA: Diagnosis not present

## 2016-08-24 DIAGNOSIS — I10 Essential (primary) hypertension: Secondary | ICD-10-CM | POA: Diagnosis not present

## 2016-08-24 DIAGNOSIS — G4700 Insomnia, unspecified: Secondary | ICD-10-CM | POA: Diagnosis not present

## 2016-08-24 DIAGNOSIS — K219 Gastro-esophageal reflux disease without esophagitis: Secondary | ICD-10-CM | POA: Diagnosis not present

## 2016-08-24 DIAGNOSIS — M4727 Other spondylosis with radiculopathy, lumbosacral region: Secondary | ICD-10-CM | POA: Diagnosis not present

## 2016-08-24 DIAGNOSIS — A4902 Methicillin resistant Staphylococcus aureus infection, unspecified site: Secondary | ICD-10-CM | POA: Diagnosis not present

## 2016-08-24 DIAGNOSIS — J9600 Acute respiratory failure, unspecified whether with hypoxia or hypercapnia: Secondary | ICD-10-CM | POA: Diagnosis not present

## 2016-08-24 DIAGNOSIS — R0600 Dyspnea, unspecified: Secondary | ICD-10-CM | POA: Diagnosis not present

## 2016-08-24 DIAGNOSIS — Y839 Surgical procedure, unspecified as the cause of abnormal reaction of the patient, or of later complication, without mention of misadventure at the time of the procedure: Secondary | ICD-10-CM | POA: Diagnosis not present

## 2016-08-24 DIAGNOSIS — R262 Difficulty in walking, not elsewhere classified: Secondary | ICD-10-CM | POA: Diagnosis not present

## 2016-08-24 DIAGNOSIS — L089 Local infection of the skin and subcutaneous tissue, unspecified: Secondary | ICD-10-CM | POA: Diagnosis not present

## 2016-08-24 DIAGNOSIS — N390 Urinary tract infection, site not specified: Secondary | ICD-10-CM | POA: Diagnosis not present

## 2016-08-24 LAB — VANCOMYCIN, TROUGH: VANCOMYCIN TR: 32 ug/mL — AB (ref 15–20)

## 2016-08-24 LAB — AEROBIC/ANAEROBIC CULTURE W GRAM STAIN (SURGICAL/DEEP WOUND)

## 2016-08-24 LAB — AEROBIC/ANAEROBIC CULTURE (SURGICAL/DEEP WOUND)

## 2016-08-24 LAB — SEDIMENTATION RATE

## 2016-08-24 LAB — CULTURE, BLOOD (ROUTINE X 2)
CULTURE: NO GROWTH
Culture: NO GROWTH

## 2016-08-24 NOTE — Progress Notes (Signed)
Pt d/c to [facility].   IV removed intact.  Rx's given to pt w/all questions and concerns addressed.  D/C paperwork reviewed and education provided with all questions and concerns addressed. Pt transported via EMS

## 2016-08-24 NOTE — Progress Notes (Addendum)
Vancomycin dose administered and documented at 0518 on 08/24/16, before both dose and trough were scheduled. Rescheduled trough for this evening.  UPDATE per RN patient received approximately 50 mL of dose (~250 mg) before she saw the vancomycin level order. She stopped dose and drew blood. Level in process now. Will f/u, but expect to be elevated.  Kristan Brummitt A. [place], [place].D., BCPS Clinical Pharmacist 08/24/2016 [phone]

## 2016-08-24 NOTE — Discharge Summary (Signed)
[facility] NAME: Tracy Espinoza    MR#:  [id number]  DATE OF BIRTH:  03-07-1946  DATE OF ADMISSION:  08/19/2016 ADMITTING PHYSICIAN: Saundra Shelling, MD  DATE OF DISCHARGE: 08/24/2016  PRIMARY CARE PHYSICIAN: Golden Pop, MD   ADMISSION DIAGNOSIS:  Shortness of breath [R06.02] Respiratory distress [R06.00] Hypoxia [R09.02] MRSA (methicillin resistant staph aureus) culture positive [Z22.322] Acute cystitis without hematuria [N30.00] HCAP (healthcare-associated pneumonia) [J18.9]  DISCHARGE DIAGNOSIS:  Principal Problem:   Hypoxia Active Problems:   Healthcare-associated pneumonia   HCAP (healthcare-associated pneumonia)   Acute respiratory failure ([place])   SECONDARY DIAGNOSIS:   Past Medical History:  Diagnosis Date  . Anxiety   . Arthritis   . Cancer ([place])    BASAL CELL SKIN CANCERS REMOVED  . GERD (gastroesophageal reflux disease)   . History of hiatal hernia   . Hypertension      ADMITTING HISTORY  HISTORY OF PRESENT ILLNESS: Tracy Espinoza  is a 70 y.o. female with a known history of Anemia, hypertension, wound infection, hiatal hernia, hypertension, GERD, recent lumbar surgery with MRSA bacteremia presented to the emergency room with increased shortness of breath. Patient was referred from [facility] as he was short of breath and her O2 sats on room air were 57%. She was put on oxygen via nonrebreather mask and was brought to the emergency room. Patient was put on BiPAP initially and stabilized. Patient had extensive hospitalization at [facility] and she had a recent lumbar surgery with wound infection. Patient underwent a debridement and irrigation and was treated with IV antibiotics and PICC line was placed and was sent to [facility]. Patient currently on IV daptomycin antibiotic. She had low-grade fever and chills and was short of breath and O2 sats were dropped. She was brought to the  emergency room and she was worked up and found to have pneumonia. Patient was weaned off BiPAP and put on oxygen via nasal cannula at 5 L in the emergency room. Case was discussed with the intensivist by ER physician who recommended IV cefepime and IV Zyvox antibiotics and stepdown unit. Patient awake and alert and responds to all verbal commands. Blood pressure was stable.  HOSPITAL COURSE:   70 year old female patient with history of anxiety disorder, arthritis, GERD, hiatal hernia, sepsis, wound infection, MRSA bacteremia presented to the emergency room with increased shortness of breath from [facility] facility  1.Acute hypoxic respiratory failure secondary to HCAP. 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 She was evaluated by ID. She was on cefepime while int he hospital and this is changed to CIPRO.  She will require O2 at discharge.  3.Normocytic anemia - monitor CBC  4.Hypertension - continue home medications  5.History of MRSA bacteremia and wound infection - Wound cultures have Pseudomonas. She was on  IV vancomycin and cefepime. She  will be discharged on IV vancomycin and PO ciprofloxacin Stop date is 09/15/2016 as per Dr. Ola Spurr  6. E coli-Urinary tract infection Changed to Ciprofloaxcin PO at discharge  7. Chronic low back pain On pain medications  CONSULTS OBTAINED:  Treatment Team:  Wilhelmina Mcardle, MD Leonel Ramsay, MD  DRUG ALLERGIES:   Allergies  Allergen Reactions  . Tape Itching, Dermatitis and Rash  . 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 Band-Aid Plus Antibiotic [Bacitracin-Polymyxin B] Other (See Comments)    Unknown  .  Iodinated Diagnostic Agents Itching  . Morphine Sulfate Itching  . Vancomycin Rash    DISCHARGE MEDICATIONS:   Current Discharge Medication List    START taking these medications   Details   ciprofloxacin (CIPRO) 500 MG tablet Take 1 tablet (500 mg total) by mouth 2 (two) times daily. Qty: 42 tablet, Refills: 0    metoprolol succinate (TOPROL-XL) 25 MG 24 hr tablet Take 1 tablet (25 mg total) by mouth daily. Qty: 30 tablet, Refills: 0    vancomycin 1,250 mg in sodium chloride 0.9 % 250 mL Inject 1,250 mg into the vein every 12 (twelve) hours.      CONTINUE these medications which have CHANGED   Details  LORazepam (ATIVAN) 0.5 MG tablet Take 2 tablets (1 mg total) by mouth every 8 (eight) hours as needed for anxiety. Qty: 10 tablet, Refills: 0    Oxycodone HCl 10 MG TABS Take 1 tablet (10 mg total) by mouth every 8 (eight) hours. Qty: 15 tablet, Refills: 0    oxyCODONE-acetaminophen (PERCOCET/ROXICET) 5-325 MG tablet Take 1 tablet by mouth every 6 (six) hours as needed for moderate pain or severe pain. Breakthrough pain Qty: 15 tablet, Refills: 0      CONTINUE these medications which have NOT CHANGED   Details  acetaminophen (TYLENOL) 325 MG tablet Take 650 mg by mouth every 6 (six) hours as needed for mild pain.    acidophilus (RISAQUAD) CAPS capsule Take 1 capsule by mouth daily.    ARTIFICIAL TEARS 0.1-0.3 % SOLN Place 1 drop into both eyes daily as needed (dry eyes).    Ascorbic Acid (VITAMIN C) 1000 MG tablet Take 1,000 mg by mouth daily.    aspirin EC 81 MG tablet Take 81 mg by mouth daily.    Calcium Carb-Cholecalciferol (CALCIUM 600+D) 600-800 MG-UNIT TABS Take 1 tablet by mouth daily.     Cholecalciferol (D3-1000) 1000 units capsule Take 1,000 Units by mouth daily.    conjugated estrogens (PREMARIN) vaginal cream Apply a blueberry sized amount to urethra using fingertip nightly x 2 weeks then 3 times weekly at bedtime. Qty: 42.5 g, Refills: 12    CRANBERRY PO Take 1-2 capsules by mouth See admin instructions. Take 1 capsule by mouth in the morning and take 2 capsules by mouth in the evening.    guaiFENesin (ROBITUSSIN) 100 MG/5ML liquid Take 400 mg by  mouth every 6 (six) hours.    ipratropium-albuterol (DUONEB) 0.5-2.5 (3) MG/3ML SOLN Take 3 mLs by nebulization every 6 (six) hours.    magnesium oxide (MAG-OX) 400 MG tablet Take 400 mg by mouth 2 (two) times daily.    Melatonin 10 MG CAPS Take 10 mg by mouth at bedtime.     methocarbamol (ROBAXIN) 500 MG tablet Take 1 tablet (500 mg total) by mouth every 6 (six) hours. Qty: 60 tablet, Refills: 0    Multiple Vitamin (MULTIVITAMIN) tablet Take 1 tablet by mouth daily.    omeprazole (PRILOSEC) 40 MG capsule Take 1 capsule (40 mg total) by mouth 2 (two) times daily. Qty: 180 capsule, Refills: 4    polyethylene glycol (MIRALAX / GLYCOLAX) packet Take 17 g by mouth 2 (two) times daily. Qty: 14 each, Refills: 0    polyvinyl alcohol (LIQUIFILM TEARS) 1.4 % ophthalmic solution Place 1 drop into both eyes daily as needed for dry eyes.     senna (SENOKOT) 8.6 MG TABS tablet Take 1 tablet (8.6 mg total) by mouth 2 (two) times daily. Qty: 120 each, Refills: 0  traZODone (DESYREL) 50 MG tablet Take 1 tablet (50 mg total) by mouth at bedtime. Qty: 30 tablet, Refills: 0    vitamin E (VITAMIN E) 400 UNIT capsule Take 400 Units by mouth daily.      STOP taking these medications     amLODipine (NORVASC) 5 MG tablet         Today   Patient has no issues overnight No complaints.   VITAL SIGNS:  Blood pressure (!) 146/75, pulse 69, temperature 97.5 F (36.4 C), temperature source Oral, resp. rate 19, height 5\' 2"  (1.575 m), weight 134.7 kg (297 lb), SpO2 98 %.  I/O:  No intake or output data in the 24 hours ending 08/24/16 0751  PHYSICAL EXAMINATION:  Physical Exam  GENERAL:  70 y.o.-year-old patient lying in the bed with no acute distress. Obese. LUNGS: Normal breath sounds bilaterally, no wheezing, rales,rhonchi or crepitation. No use of accessory muscles of respiration.  CARDIOVASCULAR: S1, S2 normal. No murmurs, rubs, or gallops.  ABDOMEN: Soft, non-tender, non-distended.  Bowel sounds present. No organomegaly or mass.  NEUROLOGIC: Moves all 4 extremities. PSYCHIATRIC: The patient is alert and oriented x 3.  SKIN: Low back ulcer  DATA REVIEW:   CBC  Recent Labs Lab 08/22/16 0543  WBC 8.7  HGB 9.0*  HCT 27.7*  PLT 325    Chemistries   Recent Labs Lab 08/19/16 0055  08/22/16 0543  NA 135  < > 139  K 3.9  < > 3.9  CL 97*  < > 92*  CO2 32  < > 42*  GLUCOSE 179*  < > 110*  BUN 21*  < > 14  CREATININE 0.91  < > 0.62  CALCIUM 8.2*  < > 8.7*  MG  --   < > 2.0  AST 19  --   --   ALT 10*  --   --   ALKPHOS 56  --   --   BILITOT 0.6  --   --   < > = values in this interval not displayed.  Cardiac Enzymes No results for input(s): TROPONINI in the last 168 hours.  Microbiology Results  Results for orders placed or performed during the hospital encounter of 08/19/16  Blood Culture (routine x 2)     Status: None (Preliminary result)   Collection Time: 08/19/16 12:55 AM  Result Value Ref Range Status   Specimen Description BLOOD LEFT LATERAL  Final   Special Requests   Final    BOTTLES DRAWN AEROBIC AND ANAEROBIC 10CCAERO,12CCANA   Culture NO GROWTH 4 DAYS  Final   Report Status PENDING  Incomplete  Blood Culture (routine x 2)     Status: None (Preliminary result)   Collection Time: 08/19/16 12:55 AM  Result Value Ref Range Status   Specimen Description BLOOD LEFT HAND  Final   Special Requests BOTTLES DRAWN AEROBIC AND ANAEROBIC [place]  Final   Culture NO GROWTH 4 DAYS  Final   Report Status PENDING  Incomplete  Urine culture     Status: Abnormal   Collection Time: 08/19/16 12:55 AM  Result Value Ref Range Status   Specimen Description URINE, RANDOM  Final   Special Requests NONE  Final   Culture >=100,000 COLONIES/mL ESCHERICHIA COLI (A)  Final   Report Status 08/21/2016 FINAL  Final   Organism ID, Bacteria ESCHERICHIA COLI (A)  Final      Susceptibility   Escherichia coli - MIC*    AMPICILLIN >=32 RESISTANT Resistant  CEFAZOLIN <=4 SENSITIVE Sensitive     CEFTRIAXONE <=1 SENSITIVE Sensitive     CIPROFLOXACIN 0.5 SENSITIVE Sensitive     GENTAMICIN <=1 SENSITIVE Sensitive     IMIPENEM <=0.25 SENSITIVE Sensitive     NITROFURANTOIN <=16 SENSITIVE Sensitive     TRIMETH/SULFA <=20 SENSITIVE Sensitive     AMPICILLIN/SULBACTAM 4 SENSITIVE Sensitive     PIP/TAZO <=4 SENSITIVE Sensitive     Extended ESBL NEGATIVE Sensitive     * >=100,000 COLONIES/mL ESCHERICHIA COLI  MRSA PCR Screening     Status: None   Collection Time: 08/19/16  8:25 AM  Result Value Ref Range Status   MRSA by PCR NEGATIVE NEGATIVE Final    Comment:        The GeneXpert MRSA Assay (FDA approved for NASAL specimens only), is one component of a comprehensive MRSA colonization surveillance program. It is not intended to diagnose MRSA infection nor to guide or monitor treatment for MRSA infections.   Aerobic/Anaerobic Culture (surgical/deep wound)     Status: None (Preliminary result)   Collection Time: 08/19/16  1:08 PM  Result Value Ref Range Status   Specimen Description BACK  Final   Special Requests NONE  Final   Gram Stain   Final    FEW WBC PRESENT, PREDOMINANTLY PMN MODERATE GRAM NEGATIVE RODS Performed at [facility]    Culture   Final    MODERATE PSEUDOMONAS AERUGINOSA NO ANAEROBES ISOLATED; CULTURE IN PROGRESS FOR 5 DAYS    Report Status PENDING  Incomplete   Organism ID, Bacteria PSEUDOMONAS AERUGINOSA  Final      Susceptibility   Pseudomonas aeruginosa - MIC*    CEFTAZIDIME 4 SENSITIVE Sensitive     CIPROFLOXACIN <=0.25 SENSITIVE Sensitive     GENTAMICIN <=1 SENSITIVE Sensitive     IMIPENEM 2 SENSITIVE Sensitive     PIP/TAZO 8 SENSITIVE Sensitive     CEFEPIME 2 SENSITIVE Sensitive     * MODERATE PSEUDOMONAS AERUGINOSA    RADIOLOGY:  No results found.  Follow up with PCP in 1 week.  Management plans discussed with the patient and she is in agreement.  CODE STATUS:     Code Status Orders         Start     Ordered   08/19/16 0827  Full code  Continuous     08/19/16 0826    Code Status History    Date Active Date Inactive Code Status Order ID Comments User Context   08/03/2016  7:44 PM 08/08/2016  6:43 PM Full Code [id number]  Eustace Moore, MD Inpatient   08/03/2016 12:46 AM 08/03/2016  7:44 PM Full Code [id number]  Rise Patience, MD ED   07/10/2016  3:36 PM 07/15/2016  2:05 PM Full Code [id number]  Kristeen Miss, MD Inpatient    Advance Directive Documentation   [place] Most Recent Value  Type of Advance Directive  Living will  Pre-existing out of facility DNR order (yellow form or pink MOST form)  No data  "MOST" Form in Place?  No data      TOTAL TIME TAKING CARE OF THIS PATIENT ON DAY OF DISCHARGE: 33 minutes Shondale Quinley M.D on 08/24/2016 at 7:51 AM  Between 7am to 6pm - Pager - [phone]  After 6pm go to [email or web address] - password [email or web address] [facility]  Office  [phone]  CC: Primary care physician; Golden Pop, MD  Note: This dictation was prepared with Dragon dictation along with smaller phrase technology. Any  transcriptional errors that result from this process are unintentional.

## 2016-08-24 NOTE — Progress Notes (Signed)
Pharmacy Antibiotic Note  Tracy Espinoza is a 70 y.o. female admitted on 08/19/2016 with pneumonia and OM. Pharmacy has been consulted for cefepime and vancomycin dosing.  Per vancomycin consult from ID MD: She has a hx of what sounds like mild phlebitis while getting IV through peripheral line- please rechallenge now that has picc  Plan: Continue cefepime 2 g IV q8h  Patient is currently on vancomycin 1250 mg IV q12h.  Vancomycin trough scheduled for this morning prior to 5th dose came back elevated at 32 mcg/mL, HOWEVER this is not a true trough as part of the dose was infused prior to trough being obtained. Therefore, I am unable to assess if dose needs to be changed at this time.   Will continue current dosing regimen at this time and will order vancomycin trough at 1730 this evening prior to the next dose. Patient will likely be discharged today. Plan communicated to RN.   Patient is also at risk of accumulation due to obesity.   Height: 5\' 2"  (157.5 cm) Weight: 297 lb (134.7 kg) IBW/kg (Calculated) : 50.1  Temp (24hrs), Avg:97.9 F (36.6 C), Min:97.5 F (36.4 C), Max:98.2 F (36.8 C)   Recent Labs Lab 08/19/16 0055 08/19/16 2231 08/19/16 2302 08/20/16 0506 08/21/16 0533 08/22/16 0543 08/24/16 0640  WBC 13.3* 11.7*  --  11.0 10.7 8.7  --   CREATININE 0.91 0.75  --  0.73 0.78 0.62  --   LATICACIDVEN 1.5  --  0.9  --   --   --   --   VANCOTROUGH  --   --   --   --   --   --  32*    Estimated Creatinine Clearance: 85.4 mL/min (by C-G formula based on SCr of 0.62 mg/dL).    Allergies  Allergen Reactions  . Tape Itching, Dermatitis and Rash  . 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 Band-Aid Plus Antibiotic [Bacitracin-Polymyxin B] Other (See Comments)    Unknown  . Iodinated Diagnostic Agents Itching  . Morphine Sulfate Itching  . Vancomycin Rash    Antimicrobials this admission: Cefepime 11/18 >> Linezolid 11/18 >>  11/20 Vanc 11/20>>   Microbiology results: 11/18 BCx: no growth 4 days 11/18 UCx: >100k EColi   11/18 MRSA PCR: neg 11/18 WCx mod pseudomonas sens cefepime   Thank you for allowing pharmacy to be a part of this patient's care.  Lenis Noon, PharmD, BCPS Clinical Pharmacist 08/24/2016 7:34 AM

## 2016-08-24 NOTE — Clinical Social Work Note (Signed)
CSW noted d/c this morning to [facility]. D/C summary from this morning sent via [facility]. Per CSW note yesterday, pt, family, and RN aware of plan and will arrange transport.  Tracy Espinoza, [place]

## 2016-08-24 NOTE — Clinical Social Work Placement (Signed)
   CLINICAL SOCIAL WORK PLACEMENT  NOTE  Date:  08/24/2016  Patient Details  Name: Tracy Espinoza MRN: [id number] Date of Birth: March 03, 1946  Clinical Social Work is seeking post-discharge placement for this patient at the [place] level of care (*CSW will initial, date and re-position this form in  chart as items are completed):  Yes   Patient/family provided with [facility] Work Department's list of facilities offering this level of care within the geographic area requested by the patient (or if unable, by the patient's family).  Yes   Patient/family informed of their freedom to choose among providers that offer the needed level of care, that participate in Medicare, Medicaid or managed care program needed by the patient, have an available bed and are willing to accept the patient.  Yes   Patient/family informed of [facility]'s ownership interest in [facility] and [facility], as well as of the fact that they are under no obligation to receive care at these facilities.  PASRR submitted to EDS on       PASRR number received on       Existing PASRR number confirmed on 08/19/16     FL2 transmitted to all facilities in geographic area requested by pt/family on 08/19/16     FL2 transmitted to all facilities within larger geographic area on       Patient informed that his/her managed care company has contracts with or will negotiate with certain facilities, including the following:        Yes   Patient/family informed of bed offers received.  Patient chooses bed at  ([facility] )     Physician recommends and patient chooses bed at      Patient to be transferred to  ([facility] ) on 08/24/16.  Patient to be transferred to facility by  [facility] )     Patient family notified on 08/23/16 of transfer.  Name of family member notified:   (Patinet's daughter [name] is aware of D/C)     PHYSICIAN       Additional Comment:     _______________________________________________ Ramsey Midgett, Veronia Beets, LCSW 08/24/2016, 1:02 PM

## 2016-08-27 DIAGNOSIS — J189 Pneumonia, unspecified organism: Secondary | ICD-10-CM | POA: Diagnosis not present

## 2016-08-27 DIAGNOSIS — K219 Gastro-esophageal reflux disease without esophagitis: Secondary | ICD-10-CM | POA: Diagnosis not present

## 2016-08-27 DIAGNOSIS — I10 Essential (primary) hypertension: Secondary | ICD-10-CM | POA: Diagnosis not present

## 2016-08-27 DIAGNOSIS — J9600 Acute respiratory failure, unspecified whether with hypoxia or hypercapnia: Secondary | ICD-10-CM | POA: Diagnosis not present

## 2016-08-27 DIAGNOSIS — F419 Anxiety disorder, unspecified: Secondary | ICD-10-CM | POA: Diagnosis not present

## 2016-08-27 DIAGNOSIS — N390 Urinary tract infection, site not specified: Secondary | ICD-10-CM | POA: Diagnosis not present

## 2016-08-28 ENCOUNTER — Other Ambulatory Visit
Admission: RE | Admit: 2016-08-28 | Discharge: 2016-08-28 | Disposition: A | Discharge status: Home / Self Care | Payer: No Typology Code available for payment source | Source: Ambulatory Visit | Attending: Family Medicine | Admitting: Family Medicine

## 2016-08-28 DIAGNOSIS — L0390 Cellulitis, unspecified: Secondary | ICD-10-CM | POA: Insufficient documentation

## 2016-08-28 LAB — VANCOMYCIN, TROUGH: Vancomycin Tr: 21 ug/mL (ref 15–20)

## 2016-09-01 ENCOUNTER — Telehealth: Payer: Self-pay | Admitting: Family Medicine

## 2016-09-01 NOTE — Telephone Encounter (Signed)
LMOM to call back to schedule an appt for a hospital fu

## 2016-09-02 DIAGNOSIS — D649 Anemia, unspecified: Secondary | ICD-10-CM | POA: Diagnosis not present

## 2016-09-02 DIAGNOSIS — K219 Gastro-esophageal reflux disease without esophagitis: Secondary | ICD-10-CM | POA: Diagnosis not present

## 2016-09-02 DIAGNOSIS — M545 Low back pain: Secondary | ICD-10-CM | POA: Diagnosis not present

## 2016-09-02 DIAGNOSIS — I10 Essential (primary) hypertension: Secondary | ICD-10-CM | POA: Diagnosis not present

## 2016-09-02 DIAGNOSIS — J9600 Acute respiratory failure, unspecified whether with hypoxia or hypercapnia: Secondary | ICD-10-CM | POA: Diagnosis not present

## 2016-09-05 ENCOUNTER — Inpatient Hospital Stay: Payer: Medicare Other | Admitting: Internal Medicine

## 2016-09-11 ENCOUNTER — Other Ambulatory Visit
Admission: RE | Admit: 2016-09-11 | Discharge: 2016-09-11 | Disposition: A | Discharge status: Home / Self Care | Payer: No Typology Code available for payment source | Source: Ambulatory Visit | Attending: Family Medicine | Admitting: Family Medicine

## 2016-09-11 DIAGNOSIS — Y839 Surgical procedure, unspecified as the cause of abnormal reaction of the patient, or of later complication, without mention of misadventure at the time of the procedure: Secondary | ICD-10-CM | POA: Insufficient documentation

## 2016-09-11 DIAGNOSIS — T814XXA Infection following a procedure, initial encounter: Secondary | ICD-10-CM | POA: Insufficient documentation

## 2016-09-11 LAB — CREATININE, SERUM
CREATININE: 0.66 mg/dL (ref 0.44–1.00)
GFR calc Af Amer: >60 mL/min (ref >60)
GFR calc non Af Amer: >60 mL/min (ref >60)

## 2016-09-11 LAB — BUN: BUN: 15 mg/dL (ref 6–20)

## 2016-09-11 LAB — VANCOMYCIN, TROUGH: VANCOMYCIN TR: 17 ug/mL (ref 15–20)

## 2016-09-12 DIAGNOSIS — M545 Low back pain: Secondary | ICD-10-CM | POA: Diagnosis not present

## 2016-09-12 DIAGNOSIS — J9600 Acute respiratory failure, unspecified whether with hypoxia or hypercapnia: Secondary | ICD-10-CM | POA: Diagnosis not present

## 2016-09-12 DIAGNOSIS — N390 Urinary tract infection, site not specified: Secondary | ICD-10-CM | POA: Diagnosis not present

## 2016-09-12 DIAGNOSIS — I10 Essential (primary) hypertension: Secondary | ICD-10-CM | POA: Diagnosis not present

## 2016-09-12 DIAGNOSIS — F419 Anxiety disorder, unspecified: Secondary | ICD-10-CM | POA: Diagnosis not present

## 2016-09-12 NOTE — Telephone Encounter (Signed)
LMOM to call back

## 2016-09-15 DIAGNOSIS — B965 Pseudomonas (aeruginosa) (mallei) (pseudomallei) as the cause of diseases classified elsewhere: Secondary | ICD-10-CM | POA: Diagnosis not present

## 2016-09-15 DIAGNOSIS — L089 Local infection of the skin and subcutaneous tissue, unspecified: Secondary | ICD-10-CM | POA: Diagnosis not present

## 2016-09-15 DIAGNOSIS — Z792 Long term (current) use of antibiotics: Secondary | ICD-10-CM | POA: Diagnosis not present

## 2016-09-15 DIAGNOSIS — A4902 Methicillin resistant Staphylococcus aureus infection, unspecified site: Secondary | ICD-10-CM | POA: Diagnosis not present

## 2016-09-15 DIAGNOSIS — T8463XA Infection and inflammatory reaction due to internal fixation device of spine, initial encounter: Secondary | ICD-10-CM | POA: Diagnosis not present

## 2016-09-17 DIAGNOSIS — F411 Generalized anxiety disorder: Secondary | ICD-10-CM | POA: Diagnosis not present

## 2016-09-17 DIAGNOSIS — H04129 Dry eye syndrome of unspecified lacrimal gland: Secondary | ICD-10-CM | POA: Diagnosis not present

## 2016-09-17 DIAGNOSIS — Z4789 Encounter for other orthopedic aftercare: Secondary | ICD-10-CM | POA: Diagnosis not present

## 2016-09-17 DIAGNOSIS — I10 Essential (primary) hypertension: Secondary | ICD-10-CM | POA: Diagnosis not present

## 2016-09-19 ENCOUNTER — Encounter: Payer: Self-pay | Admitting: Family Medicine

## 2016-09-19 ENCOUNTER — Ambulatory Visit (INDEPENDENT_AMBULATORY_CARE_PROVIDER_SITE_OTHER): Payer: Medicare Other | Admitting: Family Medicine

## 2016-09-19 DIAGNOSIS — T148XXA Other injury of unspecified body region, initial encounter: Secondary | ICD-10-CM | POA: Diagnosis not present

## 2016-09-19 DIAGNOSIS — R600 Localized edema: Secondary | ICD-10-CM | POA: Diagnosis not present

## 2016-09-19 DIAGNOSIS — L089 Local infection of the skin and subcutaneous tissue, unspecified: Secondary | ICD-10-CM | POA: Diagnosis not present

## 2016-09-19 DIAGNOSIS — A4102 Sepsis due to Methicillin resistant Staphylococcus aureus: Secondary | ICD-10-CM

## 2016-09-19 NOTE — Assessment & Plan Note (Signed)
Slowly recovering reviewed wound care

## 2016-09-19 NOTE — Assessment & Plan Note (Signed)
Recovering  

## 2016-09-19 NOTE — Assessment & Plan Note (Signed)
Reviewed leg edema care and treatment primarily elevation skin care and treatment and helping with cellulitis changes. Patient will treat with primarily elevation.

## 2016-09-19 NOTE — Progress Notes (Signed)
BP (!) 160/85 (BP Location: Left Arm, Patient Position: Sitting, Cuff Size: Large)   Pulse 77   Temp 98.3 F (36.8 C)   Wt 283 lb (128.4 kg)   SpO2 95%   BMI 51.76 kg/m    Subjective:    Patient ID: Tracy Espinoza, female    DOB: 09-09-1946, 70 y.o.   MRN: [id number]  HPI: Tracy Espinoza is a 70 y.o. female  Chief Complaint  Patient presents with  . Rehab follow up   Patient with complicated history of multiple back surgeries with secondary infection and pneumonia with ICU stay. Patient now on antibiotics for urinary tract infection complicated infection in her back and recovering from pneumonia. Patient also having leg swelling with erythematous changes of cellulitis. Reviewed medication in chart.  Relevant past medical, surgical, family and social history reviewed and updated as indicated. Interim medical history since our last visit reviewed. Allergies and medications reviewed and updated.  Review of Systems  Constitutional: Positive for activity change and fatigue. Negative for fever.  Respiratory: Negative.   Cardiovascular: Negative.     Per HPI unless specifically indicated above     Objective:    BP (!) 160/85 (BP Location: Left Arm, Patient Position: Sitting, Cuff Size: Large)   Pulse 77   Temp 98.3 F (36.8 C)   Wt 283 lb (128.4 kg)   SpO2 95%   BMI 51.76 kg/m   Wt Readings from Last 3 Encounters:  09/19/16 283 lb (128.4 kg)  08/19/16 297 lb (134.7 kg)  08/05/16 297 lb 2.9 oz (134.8 kg)    Physical Exam  Constitutional: She is oriented to person, place, and time. She appears well-developed and well-nourished. No distress.  HENT:  Head: Normocephalic and atraumatic.  Right Ear: Hearing normal.  Left Ear: Hearing normal.  Nose: Nose normal.  Eyes: Conjunctivae and lids are normal. Right eye exhibits no discharge. Left eye exhibits no discharge. No scleral icterus.  Cardiovascular: Normal rate, regular rhythm and normal heart sounds.     Pulmonary/Chest: Effort normal and breath sounds normal. No respiratory distress.  Musculoskeletal: Normal range of motion.  Legs 2+ edema with cellulitis changes  Neurological: She is alert and oriented to person, place, and time.  Skin: Skin is intact. No rash noted.  Psychiatric: She has a normal mood and affect. Her speech is normal and behavior is normal. Judgment and thought content normal. Cognition and memory are normal.    Results for orders placed or performed during the hospital encounter of 09/11/16  BUN  Result Value Ref Range   BUN 15 6 - 20 mg/dL  Creatinine, serum  Result Value Ref Range   Creatinine, Ser 0.66 0.44 - 1.00 mg/dL   GFR calc non Af Amer >60 >60 mL/min   GFR calc Af Amer >60 >60 mL/min  Vancomycin, trough  Result Value Ref Range   Vancomycin Tr 17 15 - 20 ug/mL      Assessment & Plan:   Problem List Items Addressed This Visit      Other   Sepsis ([place])    Recovering      Wound infection    Slowly recovering reviewed wound care      Bilateral leg edema    Reviewed leg edema care and treatment primarily elevation skin care and treatment and helping with cellulitis changes. Patient will treat with primarily elevation.          Follow up plan: Return in about 4 weeks (around 10/17/2016)  for Recheck multiple problems.

## 2016-09-20 DIAGNOSIS — F411 Generalized anxiety disorder: Secondary | ICD-10-CM | POA: Diagnosis not present

## 2016-09-20 DIAGNOSIS — H04129 Dry eye syndrome of unspecified lacrimal gland: Secondary | ICD-10-CM | POA: Diagnosis not present

## 2016-09-20 DIAGNOSIS — Z4789 Encounter for other orthopedic aftercare: Secondary | ICD-10-CM | POA: Diagnosis not present

## 2016-09-20 DIAGNOSIS — I10 Essential (primary) hypertension: Secondary | ICD-10-CM | POA: Diagnosis not present

## 2016-09-21 DIAGNOSIS — F411 Generalized anxiety disorder: Secondary | ICD-10-CM | POA: Diagnosis not present

## 2016-09-21 DIAGNOSIS — Z4789 Encounter for other orthopedic aftercare: Secondary | ICD-10-CM | POA: Diagnosis not present

## 2016-09-21 DIAGNOSIS — I10 Essential (primary) hypertension: Secondary | ICD-10-CM | POA: Diagnosis not present

## 2016-09-21 DIAGNOSIS — H04129 Dry eye syndrome of unspecified lacrimal gland: Secondary | ICD-10-CM | POA: Diagnosis not present

## 2016-09-22 ENCOUNTER — Telehealth: Payer: Self-pay | Admitting: Family Medicine

## 2016-09-22 MED ORDER — LORAZEPAM 0.5 MG PO TABS: 1.0000 mg | ORAL_TABLET | Freq: Three times a day (TID) | ORAL | 0 refills | Status: DC | PRN

## 2016-09-22 NOTE — Telephone Encounter (Signed)
Pt called [facility] and spoke with Leda Gauze who in turn called [facility] to request Oxycodone HCl 10 MG TABS, oxyCODONE-acetaminophen (PERCOCET/ROXICET) 5-325 MG tablet, and LORazepam (ATIVAN) 0.5 MG tablet.

## 2016-09-22 NOTE — Telephone Encounter (Signed)
Script printed for a few tablets of ativan to get her through until Dr. Jeananne Rama returns. Will wait for him to authorize pain medicines, especially since they are requesting two different forms of oxycodone.

## 2016-09-23 DIAGNOSIS — Z4789 Encounter for other orthopedic aftercare: Secondary | ICD-10-CM | POA: Diagnosis not present

## 2016-09-23 DIAGNOSIS — I10 Essential (primary) hypertension: Secondary | ICD-10-CM | POA: Diagnosis not present

## 2016-09-23 DIAGNOSIS — F411 Generalized anxiety disorder: Secondary | ICD-10-CM | POA: Diagnosis not present

## 2016-09-23 DIAGNOSIS — H04129 Dry eye syndrome of unspecified lacrimal gland: Secondary | ICD-10-CM | POA: Diagnosis not present

## 2016-09-26 DIAGNOSIS — I10 Essential (primary) hypertension: Secondary | ICD-10-CM | POA: Diagnosis not present

## 2016-09-26 DIAGNOSIS — H04129 Dry eye syndrome of unspecified lacrimal gland: Secondary | ICD-10-CM | POA: Diagnosis not present

## 2016-09-26 DIAGNOSIS — Z4789 Encounter for other orthopedic aftercare: Secondary | ICD-10-CM | POA: Diagnosis not present

## 2016-09-26 DIAGNOSIS — F411 Generalized anxiety disorder: Secondary | ICD-10-CM | POA: Diagnosis not present

## 2016-09-27 DIAGNOSIS — I10 Essential (primary) hypertension: Secondary | ICD-10-CM | POA: Diagnosis not present

## 2016-09-27 DIAGNOSIS — H04129 Dry eye syndrome of unspecified lacrimal gland: Secondary | ICD-10-CM | POA: Diagnosis not present

## 2016-09-27 DIAGNOSIS — F411 Generalized anxiety disorder: Secondary | ICD-10-CM | POA: Diagnosis not present

## 2016-09-27 DIAGNOSIS — Z4789 Encounter for other orthopedic aftercare: Secondary | ICD-10-CM | POA: Diagnosis not present

## 2016-09-27 NOTE — Telephone Encounter (Signed)
I am happy to give her a few to last her, just ask her which pain pill she is currently taking - plain oxycodone or percocet (the one with tylenol). She can not take both

## 2016-09-27 NOTE — Telephone Encounter (Signed)
Spoke with pt and she stated that she takes both.

## 2016-09-27 NOTE — Telephone Encounter (Signed)
Spoke with Tracy Espinoza and she stated that she only has 2 days of pain meds left and would like to know if she could have enough to last until her appt with Dr Jeananne Rama on 10/12/16 . She stated that at her appt she wasn't aware that she needed to request refills.

## 2016-09-28 MED ORDER — OXYCODONE-ACETAMINOPHEN 5-325 MG PO TABS: 1.0000 | ORAL_TABLET | Freq: Three times a day (TID) | ORAL | 0 refills | Status: DC | PRN

## 2016-09-28 NOTE — Addendum Note (Signed)
Addended by: Merrie Roof E on: 09/28/2016 11:12 AM   Modules accepted: Orders

## 2016-09-28 NOTE — Telephone Encounter (Signed)
Should not be taking both as they are the same base medication. Will send in the percocet to get her through until she can see Dr. Jeananne Rama

## 2016-09-28 NOTE — Telephone Encounter (Signed)
Patient notified

## 2016-09-29 DIAGNOSIS — I10 Essential (primary) hypertension: Secondary | ICD-10-CM | POA: Diagnosis not present

## 2016-09-29 DIAGNOSIS — F411 Generalized anxiety disorder: Secondary | ICD-10-CM | POA: Diagnosis not present

## 2016-09-29 DIAGNOSIS — H04129 Dry eye syndrome of unspecified lacrimal gland: Secondary | ICD-10-CM | POA: Diagnosis not present

## 2016-09-29 DIAGNOSIS — Z4789 Encounter for other orthopedic aftercare: Secondary | ICD-10-CM | POA: Diagnosis not present

## 2016-09-30 DIAGNOSIS — H04129 Dry eye syndrome of unspecified lacrimal gland: Secondary | ICD-10-CM | POA: Diagnosis not present

## 2016-09-30 DIAGNOSIS — Z4789 Encounter for other orthopedic aftercare: Secondary | ICD-10-CM | POA: Diagnosis not present

## 2016-09-30 DIAGNOSIS — F411 Generalized anxiety disorder: Secondary | ICD-10-CM | POA: Diagnosis not present

## 2016-09-30 DIAGNOSIS — M6281 Muscle weakness (generalized): Secondary | ICD-10-CM | POA: Diagnosis not present

## 2016-09-30 DIAGNOSIS — I10 Essential (primary) hypertension: Secondary | ICD-10-CM | POA: Diagnosis not present

## 2016-09-30 DIAGNOSIS — Z7982 Long term (current) use of aspirin: Secondary | ICD-10-CM | POA: Diagnosis not present

## 2016-10-02 DIAGNOSIS — I10 Essential (primary) hypertension: Secondary | ICD-10-CM | POA: Diagnosis not present

## 2016-10-02 DIAGNOSIS — M6281 Muscle weakness (generalized): Secondary | ICD-10-CM | POA: Diagnosis not present

## 2016-10-02 DIAGNOSIS — Z4789 Encounter for other orthopedic aftercare: Secondary | ICD-10-CM | POA: Diagnosis not present

## 2016-10-02 DIAGNOSIS — H04129 Dry eye syndrome of unspecified lacrimal gland: Secondary | ICD-10-CM | POA: Diagnosis not present

## 2016-10-02 DIAGNOSIS — F411 Generalized anxiety disorder: Secondary | ICD-10-CM | POA: Diagnosis not present

## 2016-10-03 DIAGNOSIS — I10 Essential (primary) hypertension: Secondary | ICD-10-CM | POA: Diagnosis not present

## 2016-10-03 DIAGNOSIS — H04129 Dry eye syndrome of unspecified lacrimal gland: Secondary | ICD-10-CM | POA: Diagnosis not present

## 2016-10-03 DIAGNOSIS — Z4789 Encounter for other orthopedic aftercare: Secondary | ICD-10-CM | POA: Diagnosis not present

## 2016-10-03 DIAGNOSIS — F411 Generalized anxiety disorder: Secondary | ICD-10-CM | POA: Diagnosis not present

## 2016-10-03 DIAGNOSIS — M6281 Muscle weakness (generalized): Secondary | ICD-10-CM | POA: Diagnosis not present

## 2016-10-04 DIAGNOSIS — Z4789 Encounter for other orthopedic aftercare: Secondary | ICD-10-CM | POA: Diagnosis not present

## 2016-10-04 DIAGNOSIS — M6281 Muscle weakness (generalized): Secondary | ICD-10-CM | POA: Diagnosis not present

## 2016-10-04 DIAGNOSIS — F411 Generalized anxiety disorder: Secondary | ICD-10-CM | POA: Diagnosis not present

## 2016-10-04 DIAGNOSIS — H04129 Dry eye syndrome of unspecified lacrimal gland: Secondary | ICD-10-CM | POA: Diagnosis not present

## 2016-10-04 DIAGNOSIS — I10 Essential (primary) hypertension: Secondary | ICD-10-CM | POA: Diagnosis not present

## 2016-10-06 DIAGNOSIS — H04129 Dry eye syndrome of unspecified lacrimal gland: Secondary | ICD-10-CM | POA: Diagnosis not present

## 2016-10-06 DIAGNOSIS — Z4789 Encounter for other orthopedic aftercare: Secondary | ICD-10-CM | POA: Diagnosis not present

## 2016-10-06 DIAGNOSIS — M6281 Muscle weakness (generalized): Secondary | ICD-10-CM | POA: Diagnosis not present

## 2016-10-06 DIAGNOSIS — F411 Generalized anxiety disorder: Secondary | ICD-10-CM | POA: Diagnosis not present

## 2016-10-06 DIAGNOSIS — I10 Essential (primary) hypertension: Secondary | ICD-10-CM | POA: Diagnosis not present

## 2016-10-09 DIAGNOSIS — I10 Essential (primary) hypertension: Secondary | ICD-10-CM | POA: Diagnosis not present

## 2016-10-09 DIAGNOSIS — Z4789 Encounter for other orthopedic aftercare: Secondary | ICD-10-CM | POA: Diagnosis not present

## 2016-10-09 DIAGNOSIS — M6281 Muscle weakness (generalized): Secondary | ICD-10-CM | POA: Diagnosis not present

## 2016-10-09 DIAGNOSIS — H04129 Dry eye syndrome of unspecified lacrimal gland: Secondary | ICD-10-CM | POA: Diagnosis not present

## 2016-10-09 DIAGNOSIS — F411 Generalized anxiety disorder: Secondary | ICD-10-CM | POA: Diagnosis not present

## 2016-10-10 DIAGNOSIS — H04129 Dry eye syndrome of unspecified lacrimal gland: Secondary | ICD-10-CM | POA: Diagnosis not present

## 2016-10-10 DIAGNOSIS — I10 Essential (primary) hypertension: Secondary | ICD-10-CM | POA: Diagnosis not present

## 2016-10-10 DIAGNOSIS — F411 Generalized anxiety disorder: Secondary | ICD-10-CM | POA: Diagnosis not present

## 2016-10-10 DIAGNOSIS — Z4789 Encounter for other orthopedic aftercare: Secondary | ICD-10-CM | POA: Diagnosis not present

## 2016-10-10 DIAGNOSIS — M6281 Muscle weakness (generalized): Secondary | ICD-10-CM | POA: Diagnosis not present

## 2016-10-11 DIAGNOSIS — M6281 Muscle weakness (generalized): Secondary | ICD-10-CM | POA: Diagnosis not present

## 2016-10-11 DIAGNOSIS — I10 Essential (primary) hypertension: Secondary | ICD-10-CM | POA: Diagnosis not present

## 2016-10-11 DIAGNOSIS — Z4789 Encounter for other orthopedic aftercare: Secondary | ICD-10-CM | POA: Diagnosis not present

## 2016-10-11 DIAGNOSIS — H04129 Dry eye syndrome of unspecified lacrimal gland: Secondary | ICD-10-CM | POA: Diagnosis not present

## 2016-10-11 DIAGNOSIS — F411 Generalized anxiety disorder: Secondary | ICD-10-CM | POA: Diagnosis not present

## 2016-10-12 ENCOUNTER — Ambulatory Visit: Payer: Medicare Other | Admitting: Family Medicine

## 2016-10-12 DIAGNOSIS — M4316 Spondylolisthesis, lumbar region: Secondary | ICD-10-CM | POA: Diagnosis not present

## 2016-10-13 DIAGNOSIS — M6281 Muscle weakness (generalized): Secondary | ICD-10-CM | POA: Diagnosis not present

## 2016-10-13 DIAGNOSIS — I10 Essential (primary) hypertension: Secondary | ICD-10-CM | POA: Diagnosis not present

## 2016-10-13 DIAGNOSIS — Z4789 Encounter for other orthopedic aftercare: Secondary | ICD-10-CM | POA: Diagnosis not present

## 2016-10-13 DIAGNOSIS — F411 Generalized anxiety disorder: Secondary | ICD-10-CM | POA: Diagnosis not present

## 2016-10-13 DIAGNOSIS — B965 Pseudomonas (aeruginosa) (mallei) (pseudomallei) as the cause of diseases classified elsewhere: Secondary | ICD-10-CM | POA: Diagnosis not present

## 2016-10-13 DIAGNOSIS — L089 Local infection of the skin and subcutaneous tissue, unspecified: Secondary | ICD-10-CM | POA: Diagnosis not present

## 2016-10-13 DIAGNOSIS — H04129 Dry eye syndrome of unspecified lacrimal gland: Secondary | ICD-10-CM | POA: Diagnosis not present

## 2016-10-13 DIAGNOSIS — T148XXA Other injury of unspecified body region, initial encounter: Secondary | ICD-10-CM | POA: Diagnosis not present

## 2016-10-13 DIAGNOSIS — A4902 Methicillin resistant Staphylococcus aureus infection, unspecified site: Secondary | ICD-10-CM | POA: Diagnosis not present

## 2016-10-16 DIAGNOSIS — F411 Generalized anxiety disorder: Secondary | ICD-10-CM | POA: Diagnosis not present

## 2016-10-16 DIAGNOSIS — Z4789 Encounter for other orthopedic aftercare: Secondary | ICD-10-CM | POA: Diagnosis not present

## 2016-10-16 DIAGNOSIS — H04129 Dry eye syndrome of unspecified lacrimal gland: Secondary | ICD-10-CM | POA: Diagnosis not present

## 2016-10-16 DIAGNOSIS — I10 Essential (primary) hypertension: Secondary | ICD-10-CM | POA: Diagnosis not present

## 2016-10-16 DIAGNOSIS — M6281 Muscle weakness (generalized): Secondary | ICD-10-CM | POA: Diagnosis not present

## 2016-10-17 ENCOUNTER — Telehealth: Payer: Self-pay | Admitting: Family Medicine

## 2016-10-17 DIAGNOSIS — Z4789 Encounter for other orthopedic aftercare: Secondary | ICD-10-CM | POA: Diagnosis not present

## 2016-10-17 DIAGNOSIS — M6281 Muscle weakness (generalized): Secondary | ICD-10-CM | POA: Diagnosis not present

## 2016-10-17 DIAGNOSIS — F411 Generalized anxiety disorder: Secondary | ICD-10-CM | POA: Diagnosis not present

## 2016-10-17 DIAGNOSIS — I10 Essential (primary) hypertension: Secondary | ICD-10-CM | POA: Diagnosis not present

## 2016-10-17 DIAGNOSIS — H04129 Dry eye syndrome of unspecified lacrimal gland: Secondary | ICD-10-CM | POA: Diagnosis not present

## 2016-10-17 MED ORDER — METOPROLOL SUCCINATE ER 25 MG PO TB24: 25.0000 mg | ORAL_TABLET | Freq: Every day | ORAL | 1 refills | Status: DC

## 2016-10-17 MED ORDER — LORAZEPAM 0.5 MG PO TABS: 1.0000 mg | ORAL_TABLET | Freq: Three times a day (TID) | ORAL | 0 refills | Status: DC | PRN

## 2016-10-17 NOTE — Telephone Encounter (Signed)
Spoke with patient. States she is unable to keep appointment on Thursday because her daughter cannot bring her, and she is still on a walker and not comfortable coming by herself. Pt requesting refill on Lorazepam, Oxycodone, and metoprolol. States that she needs lorazepam because she is still having issues with wound and is going to have to go back in because it is not closing. Please advise.    Pt would like metoprolol sent to [place].

## 2016-10-17 NOTE — Telephone Encounter (Signed)
We will give limited lorazepam and refill on metoprolol   of course narcotics need to come from treating physician

## 2016-10-17 NOTE — Telephone Encounter (Signed)
Patient would like to speak with [facility] regarding some questions that she has. She prefers to speak with Corey Skains to ask these questions.Patient was supposed to see Dr Jeananne Rama on Thursday but due to no transportation she cannot keep this appt.  Thank You  [phone]

## 2016-10-17 NOTE — Telephone Encounter (Signed)
rx

## 2016-10-17 NOTE — Telephone Encounter (Signed)
Message relayed to patient. Verbalized understanding and denied questions.  Will placed RX at front desk for pickup after signature.

## 2016-10-19 ENCOUNTER — Ambulatory Visit: Payer: Medicare Other | Admitting: Family Medicine

## 2016-10-19 DIAGNOSIS — I10 Essential (primary) hypertension: Secondary | ICD-10-CM | POA: Diagnosis not present

## 2016-10-19 DIAGNOSIS — H04129 Dry eye syndrome of unspecified lacrimal gland: Secondary | ICD-10-CM | POA: Diagnosis not present

## 2016-10-19 DIAGNOSIS — Z4789 Encounter for other orthopedic aftercare: Secondary | ICD-10-CM | POA: Diagnosis not present

## 2016-10-19 DIAGNOSIS — M6281 Muscle weakness (generalized): Secondary | ICD-10-CM | POA: Diagnosis not present

## 2016-10-19 DIAGNOSIS — F411 Generalized anxiety disorder: Secondary | ICD-10-CM | POA: Diagnosis not present

## 2016-10-21 DIAGNOSIS — F411 Generalized anxiety disorder: Secondary | ICD-10-CM | POA: Diagnosis not present

## 2016-10-21 DIAGNOSIS — Z4789 Encounter for other orthopedic aftercare: Secondary | ICD-10-CM | POA: Diagnosis not present

## 2016-10-21 DIAGNOSIS — M6281 Muscle weakness (generalized): Secondary | ICD-10-CM | POA: Diagnosis not present

## 2016-10-21 DIAGNOSIS — H04129 Dry eye syndrome of unspecified lacrimal gland: Secondary | ICD-10-CM | POA: Diagnosis not present

## 2016-10-21 DIAGNOSIS — I10 Essential (primary) hypertension: Secondary | ICD-10-CM | POA: Diagnosis not present

## 2016-10-23 DIAGNOSIS — H04129 Dry eye syndrome of unspecified lacrimal gland: Secondary | ICD-10-CM | POA: Diagnosis not present

## 2016-10-23 DIAGNOSIS — F411 Generalized anxiety disorder: Secondary | ICD-10-CM | POA: Diagnosis not present

## 2016-10-23 DIAGNOSIS — I10 Essential (primary) hypertension: Secondary | ICD-10-CM | POA: Diagnosis not present

## 2016-10-23 DIAGNOSIS — Z4789 Encounter for other orthopedic aftercare: Secondary | ICD-10-CM | POA: Diagnosis not present

## 2016-10-23 DIAGNOSIS — M6281 Muscle weakness (generalized): Secondary | ICD-10-CM | POA: Diagnosis not present

## 2016-10-24 DIAGNOSIS — Z4789 Encounter for other orthopedic aftercare: Secondary | ICD-10-CM | POA: Diagnosis not present

## 2016-10-24 DIAGNOSIS — F411 Generalized anxiety disorder: Secondary | ICD-10-CM | POA: Diagnosis not present

## 2016-10-24 DIAGNOSIS — M6281 Muscle weakness (generalized): Secondary | ICD-10-CM | POA: Diagnosis not present

## 2016-10-24 DIAGNOSIS — I10 Essential (primary) hypertension: Secondary | ICD-10-CM | POA: Diagnosis not present

## 2016-10-24 DIAGNOSIS — H04129 Dry eye syndrome of unspecified lacrimal gland: Secondary | ICD-10-CM | POA: Diagnosis not present

## 2016-10-25 DIAGNOSIS — H04129 Dry eye syndrome of unspecified lacrimal gland: Secondary | ICD-10-CM | POA: Diagnosis not present

## 2016-10-25 DIAGNOSIS — Z4789 Encounter for other orthopedic aftercare: Secondary | ICD-10-CM | POA: Diagnosis not present

## 2016-10-25 DIAGNOSIS — I10 Essential (primary) hypertension: Secondary | ICD-10-CM | POA: Diagnosis not present

## 2016-10-25 DIAGNOSIS — M6281 Muscle weakness (generalized): Secondary | ICD-10-CM | POA: Diagnosis not present

## 2016-10-25 DIAGNOSIS — F411 Generalized anxiety disorder: Secondary | ICD-10-CM | POA: Diagnosis not present

## 2016-10-26 NOTE — Telephone Encounter (Signed)
Pt called and stated that she called the pharmacy about her metoprolol succinate (TOPROL-XL) 25 MG 24 hr tablet but they say they haven't received it and she would like to know if this could be called in again.

## 2016-10-26 NOTE — Telephone Encounter (Signed)
RX called in to [facility]. Was at [facility].

## 2016-10-27 DIAGNOSIS — H04129 Dry eye syndrome of unspecified lacrimal gland: Secondary | ICD-10-CM | POA: Diagnosis not present

## 2016-10-27 DIAGNOSIS — Z4789 Encounter for other orthopedic aftercare: Secondary | ICD-10-CM | POA: Diagnosis not present

## 2016-10-27 DIAGNOSIS — F411 Generalized anxiety disorder: Secondary | ICD-10-CM | POA: Diagnosis not present

## 2016-10-27 DIAGNOSIS — I10 Essential (primary) hypertension: Secondary | ICD-10-CM | POA: Diagnosis not present

## 2016-10-27 DIAGNOSIS — M6281 Muscle weakness (generalized): Secondary | ICD-10-CM | POA: Diagnosis not present

## 2016-10-30 DIAGNOSIS — H04129 Dry eye syndrome of unspecified lacrimal gland: Secondary | ICD-10-CM | POA: Diagnosis not present

## 2016-10-30 DIAGNOSIS — Z4789 Encounter for other orthopedic aftercare: Secondary | ICD-10-CM | POA: Diagnosis not present

## 2016-10-30 DIAGNOSIS — F411 Generalized anxiety disorder: Secondary | ICD-10-CM | POA: Diagnosis not present

## 2016-10-30 DIAGNOSIS — I10 Essential (primary) hypertension: Secondary | ICD-10-CM | POA: Diagnosis not present

## 2016-10-30 DIAGNOSIS — M6281 Muscle weakness (generalized): Secondary | ICD-10-CM | POA: Diagnosis not present

## 2016-11-02 DIAGNOSIS — F411 Generalized anxiety disorder: Secondary | ICD-10-CM | POA: Diagnosis not present

## 2016-11-02 DIAGNOSIS — M6281 Muscle weakness (generalized): Secondary | ICD-10-CM | POA: Diagnosis not present

## 2016-11-02 DIAGNOSIS — H04129 Dry eye syndrome of unspecified lacrimal gland: Secondary | ICD-10-CM | POA: Diagnosis not present

## 2016-11-02 DIAGNOSIS — Z4789 Encounter for other orthopedic aftercare: Secondary | ICD-10-CM | POA: Diagnosis not present

## 2016-11-02 DIAGNOSIS — I10 Essential (primary) hypertension: Secondary | ICD-10-CM | POA: Diagnosis not present

## 2016-11-06 ENCOUNTER — Ambulatory Visit: Payer: Medicare Other | Admitting: Family Medicine

## 2016-11-06 DIAGNOSIS — I10 Essential (primary) hypertension: Secondary | ICD-10-CM | POA: Diagnosis not present

## 2016-11-06 DIAGNOSIS — F411 Generalized anxiety disorder: Secondary | ICD-10-CM | POA: Diagnosis not present

## 2016-11-06 DIAGNOSIS — Z4789 Encounter for other orthopedic aftercare: Secondary | ICD-10-CM | POA: Diagnosis not present

## 2016-11-06 DIAGNOSIS — H04129 Dry eye syndrome of unspecified lacrimal gland: Secondary | ICD-10-CM | POA: Diagnosis not present

## 2016-11-06 DIAGNOSIS — M6281 Muscle weakness (generalized): Secondary | ICD-10-CM | POA: Diagnosis not present

## 2016-11-07 DIAGNOSIS — Z4789 Encounter for other orthopedic aftercare: Secondary | ICD-10-CM | POA: Diagnosis not present

## 2016-11-07 DIAGNOSIS — F411 Generalized anxiety disorder: Secondary | ICD-10-CM | POA: Diagnosis not present

## 2016-11-07 DIAGNOSIS — I10 Essential (primary) hypertension: Secondary | ICD-10-CM | POA: Diagnosis not present

## 2016-11-07 DIAGNOSIS — H04129 Dry eye syndrome of unspecified lacrimal gland: Secondary | ICD-10-CM | POA: Diagnosis not present

## 2016-11-07 DIAGNOSIS — M6281 Muscle weakness (generalized): Secondary | ICD-10-CM | POA: Diagnosis not present

## 2016-11-08 DIAGNOSIS — H04129 Dry eye syndrome of unspecified lacrimal gland: Secondary | ICD-10-CM | POA: Diagnosis not present

## 2016-11-08 DIAGNOSIS — M6281 Muscle weakness (generalized): Secondary | ICD-10-CM | POA: Diagnosis not present

## 2016-11-08 DIAGNOSIS — I10 Essential (primary) hypertension: Secondary | ICD-10-CM | POA: Diagnosis not present

## 2016-11-08 DIAGNOSIS — F411 Generalized anxiety disorder: Secondary | ICD-10-CM | POA: Diagnosis not present

## 2016-11-08 DIAGNOSIS — Z4789 Encounter for other orthopedic aftercare: Secondary | ICD-10-CM | POA: Diagnosis not present

## 2016-11-13 DIAGNOSIS — I10 Essential (primary) hypertension: Secondary | ICD-10-CM | POA: Diagnosis not present

## 2016-11-13 DIAGNOSIS — Z4789 Encounter for other orthopedic aftercare: Secondary | ICD-10-CM | POA: Diagnosis not present

## 2016-11-13 DIAGNOSIS — F411 Generalized anxiety disorder: Secondary | ICD-10-CM | POA: Diagnosis not present

## 2016-11-13 DIAGNOSIS — M6281 Muscle weakness (generalized): Secondary | ICD-10-CM | POA: Diagnosis not present

## 2016-11-13 DIAGNOSIS — H04129 Dry eye syndrome of unspecified lacrimal gland: Secondary | ICD-10-CM | POA: Diagnosis not present

## 2016-11-15 ENCOUNTER — Telehealth: Payer: Self-pay | Admitting: Family Medicine

## 2016-11-15 DIAGNOSIS — F411 Generalized anxiety disorder: Secondary | ICD-10-CM | POA: Diagnosis not present

## 2016-11-15 DIAGNOSIS — M6281 Muscle weakness (generalized): Secondary | ICD-10-CM | POA: Diagnosis not present

## 2016-11-15 DIAGNOSIS — I10 Essential (primary) hypertension: Secondary | ICD-10-CM | POA: Diagnosis not present

## 2016-11-15 DIAGNOSIS — Z4789 Encounter for other orthopedic aftercare: Secondary | ICD-10-CM | POA: Diagnosis not present

## 2016-11-15 DIAGNOSIS — H04129 Dry eye syndrome of unspecified lacrimal gland: Secondary | ICD-10-CM | POA: Diagnosis not present

## 2016-11-15 NOTE — Telephone Encounter (Signed)
Verbal given 

## 2016-11-15 NOTE — Telephone Encounter (Signed)
Erin with [facility] called and would like a verbal order for re certification for home health physical therapy 2 times a week for 4 weeks.

## 2016-11-20 DIAGNOSIS — F411 Generalized anxiety disorder: Secondary | ICD-10-CM | POA: Diagnosis not present

## 2016-11-20 DIAGNOSIS — M6281 Muscle weakness (generalized): Secondary | ICD-10-CM | POA: Diagnosis not present

## 2016-11-20 DIAGNOSIS — Z4789 Encounter for other orthopedic aftercare: Secondary | ICD-10-CM | POA: Diagnosis not present

## 2016-11-20 DIAGNOSIS — H04129 Dry eye syndrome of unspecified lacrimal gland: Secondary | ICD-10-CM | POA: Diagnosis not present

## 2016-11-20 DIAGNOSIS — I10 Essential (primary) hypertension: Secondary | ICD-10-CM | POA: Diagnosis not present

## 2016-11-23 DIAGNOSIS — M4316 Spondylolisthesis, lumbar region: Secondary | ICD-10-CM | POA: Diagnosis not present

## 2016-11-24 ENCOUNTER — Other Ambulatory Visit: Payer: Self-pay | Admitting: Neurological Surgery

## 2016-11-24 DIAGNOSIS — T148XXA Other injury of unspecified body region, initial encounter: Secondary | ICD-10-CM | POA: Diagnosis not present

## 2016-11-24 DIAGNOSIS — I10 Essential (primary) hypertension: Secondary | ICD-10-CM | POA: Diagnosis not present

## 2016-11-24 DIAGNOSIS — Z8614 Personal history of Methicillin resistant Staphylococcus aureus infection: Secondary | ICD-10-CM | POA: Diagnosis not present

## 2016-11-24 DIAGNOSIS — M6281 Muscle weakness (generalized): Secondary | ICD-10-CM | POA: Diagnosis not present

## 2016-11-24 DIAGNOSIS — M4636 Infection of intervertebral disc (pyogenic), lumbar region: Secondary | ICD-10-CM | POA: Diagnosis not present

## 2016-11-24 DIAGNOSIS — A4902 Methicillin resistant Staphylococcus aureus infection, unspecified site: Secondary | ICD-10-CM | POA: Diagnosis not present

## 2016-11-24 DIAGNOSIS — H04129 Dry eye syndrome of unspecified lacrimal gland: Secondary | ICD-10-CM | POA: Diagnosis not present

## 2016-11-24 DIAGNOSIS — L089 Local infection of the skin and subcutaneous tissue, unspecified: Secondary | ICD-10-CM | POA: Diagnosis not present

## 2016-11-24 DIAGNOSIS — F411 Generalized anxiety disorder: Secondary | ICD-10-CM | POA: Diagnosis not present

## 2016-11-24 DIAGNOSIS — M4316 Spondylolisthesis, lumbar region: Secondary | ICD-10-CM

## 2016-11-24 DIAGNOSIS — B965 Pseudomonas (aeruginosa) (mallei) (pseudomallei) as the cause of diseases classified elsewhere: Secondary | ICD-10-CM | POA: Diagnosis not present

## 2016-11-24 DIAGNOSIS — Z4789 Encounter for other orthopedic aftercare: Secondary | ICD-10-CM | POA: Diagnosis not present

## 2016-11-27 ENCOUNTER — Other Ambulatory Visit: Payer: Self-pay | Admitting: Family Medicine

## 2016-11-27 IMAGING — DX DG CHEST 1V PORT
1 series · 1 of 1 positions shown · non-contrast
Comparison: Chest radiograph performed earlier today at [DATE] a.m.

CLINICAL DATA: Acute onset of respiratory failure. Worsening
dyspnea and hypoxia. Initial encounter.

EXAM:
PORTABLE CHEST 1 VIEW

[chest ap]
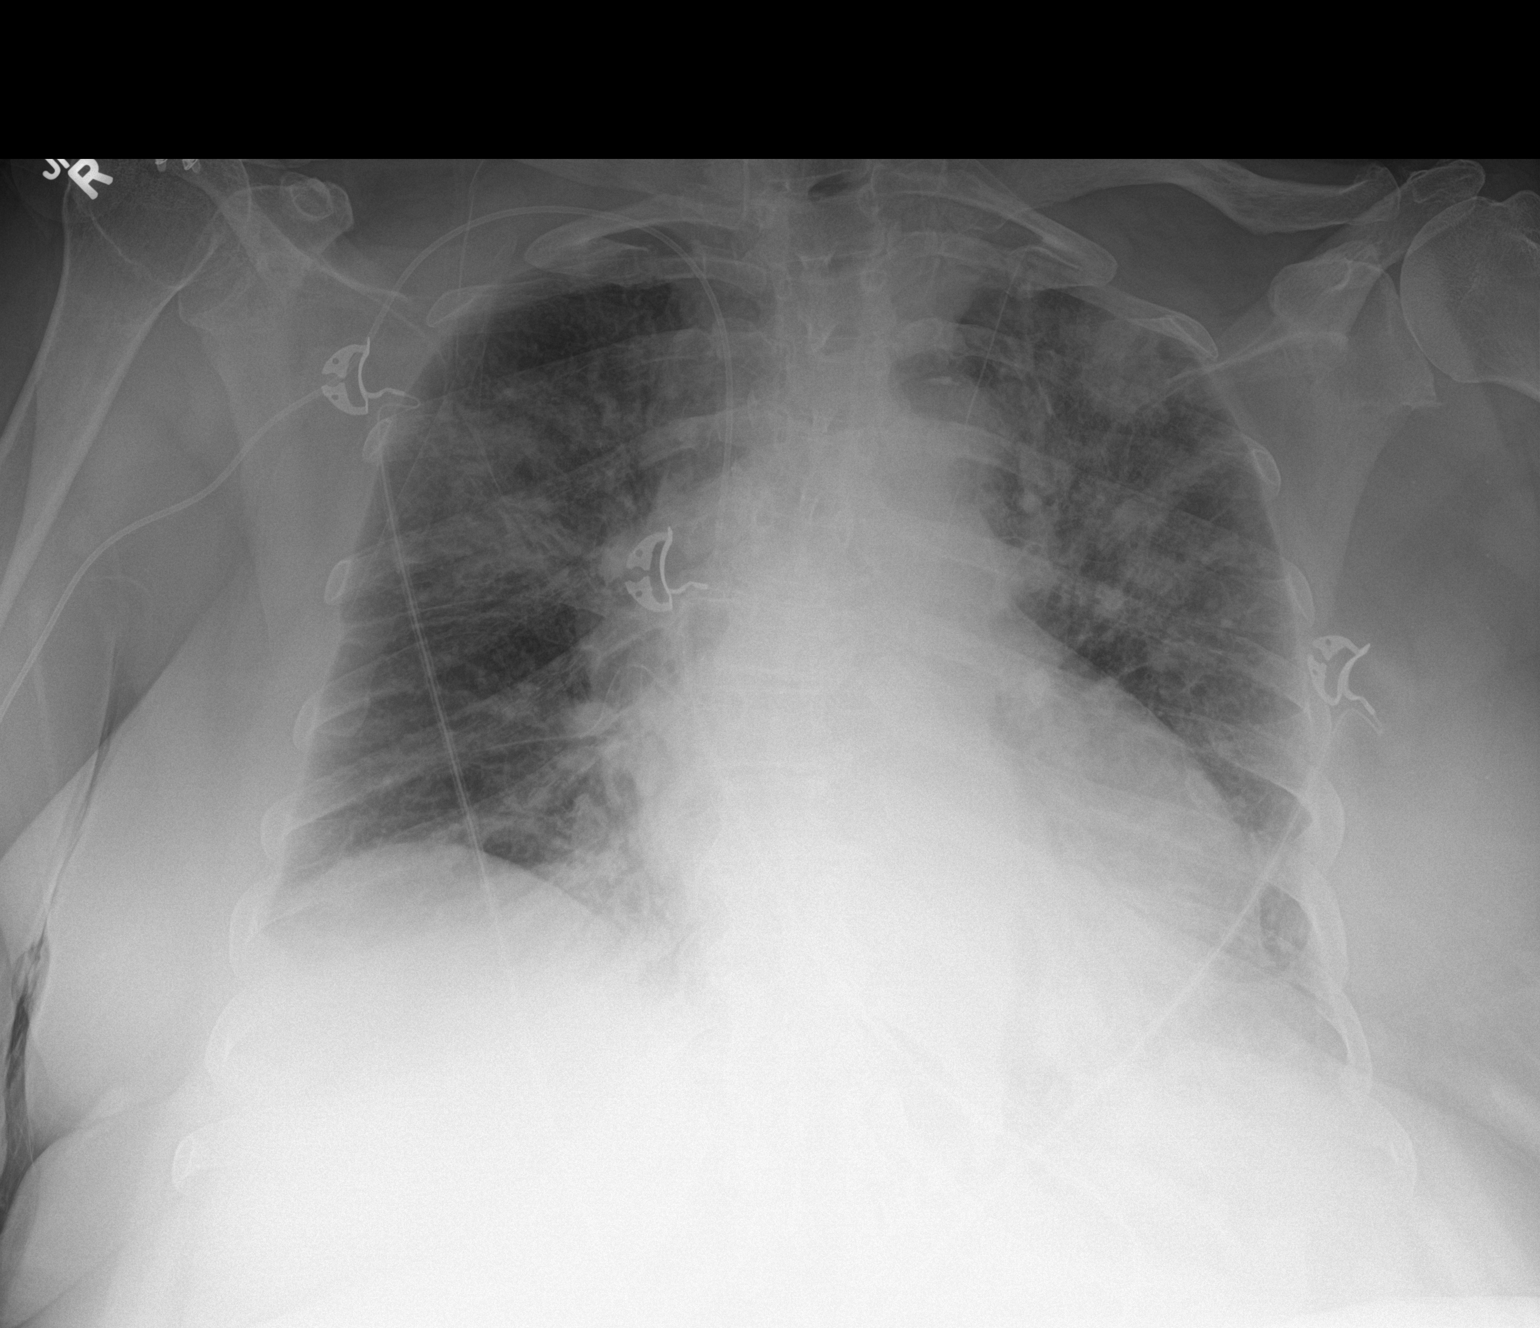

[1 of 1 positions shown; findings below may reference images not displayed]

FINDINGS: A right PICC is noted ending about the mid SVC.

The lungs are well-aerated. Patchy bilateral airspace opacities
raise concern for multifocal pneumonia, worsened from the prior
study. There is no evidence of pleural effusion or pneumothorax.

The cardiomediastinal silhouette is borderline enlarged. No acute
osseous abnormalities are seen.
IMPRESSION: Patchy bilateral airspace opacities raise concern for multifocal
pneumonia, worsened from the prior study. Borderline cardiomegaly.

Followup PA and lateral chest X-ray is recommended in 3-4 weeks
following trial of antibiotic therapy to ensure resolution and
exclude underlying malignancy.

## 2016-11-27 IMAGING — DX DG CHEST 1V PORT
1 series · 1 of 1 positions shown · non-contrast
Comparison: 08/07/2016

CLINICAL DATA: Dyspnea and hypoxia tonight

EXAM:
PORTABLE CHEST 1 VIEW

[chest ap]
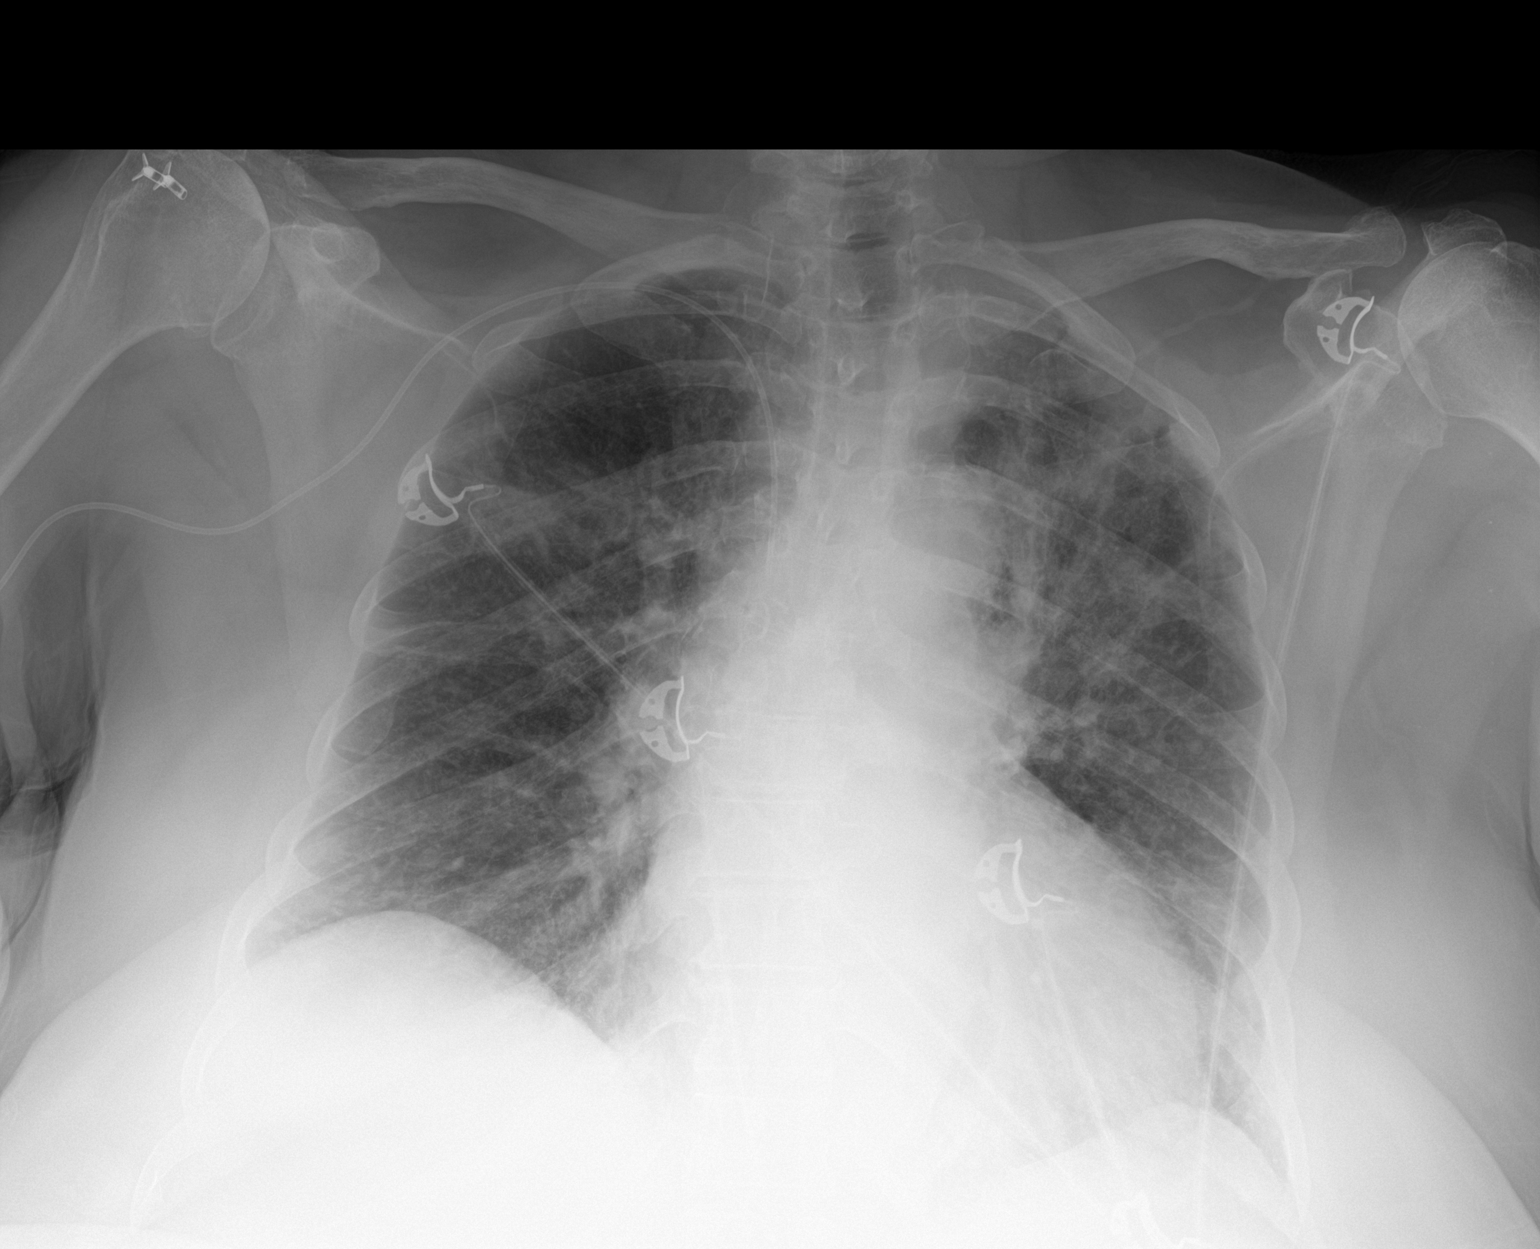

[1 of 1 positions shown; findings below may reference images not displayed]

FINDINGS: Right upper extremity PICC line extends into the SVC.

Left upper lobe consolidation, new from 08/07/2016. Patchy opacity
in the right upper lobe. No large effusion. Unchanged borderline
cardiomegaly.
IMPRESSION: Upper lobe consolidation, left greater than right, likely pneumonia.
Followup PA and lateral chest X-ray is recommended in 3-4 weeks
following trial of antibiotic therapy to ensure resolution and
exclude underlying malignancy.

## 2016-11-29 ENCOUNTER — Other Ambulatory Visit: Payer: Self-pay | Admitting: Neurological Surgery

## 2016-11-29 DIAGNOSIS — M4316 Spondylolisthesis, lumbar region: Secondary | ICD-10-CM

## 2016-11-29 DIAGNOSIS — F411 Generalized anxiety disorder: Secondary | ICD-10-CM | POA: Diagnosis not present

## 2016-11-29 DIAGNOSIS — H04129 Dry eye syndrome of unspecified lacrimal gland: Secondary | ICD-10-CM | POA: Diagnosis not present

## 2016-11-29 DIAGNOSIS — Z7982 Long term (current) use of aspirin: Secondary | ICD-10-CM | POA: Diagnosis not present

## 2016-11-29 DIAGNOSIS — M6281 Muscle weakness (generalized): Secondary | ICD-10-CM | POA: Diagnosis not present

## 2016-11-29 DIAGNOSIS — I10 Essential (primary) hypertension: Secondary | ICD-10-CM | POA: Diagnosis not present

## 2016-11-29 DIAGNOSIS — R269 Unspecified abnormalities of gait and mobility: Secondary | ICD-10-CM | POA: Diagnosis not present

## 2016-11-29 IMAGING — DX DG CHEST 1V PORT
1 series · 1 of 1 positions shown · non-contrast
Comparison: 08/19/2016 .

CLINICAL DATA: Respiratory failure.

EXAM:
PORTABLE CHEST 1 VIEW

[chest ap]
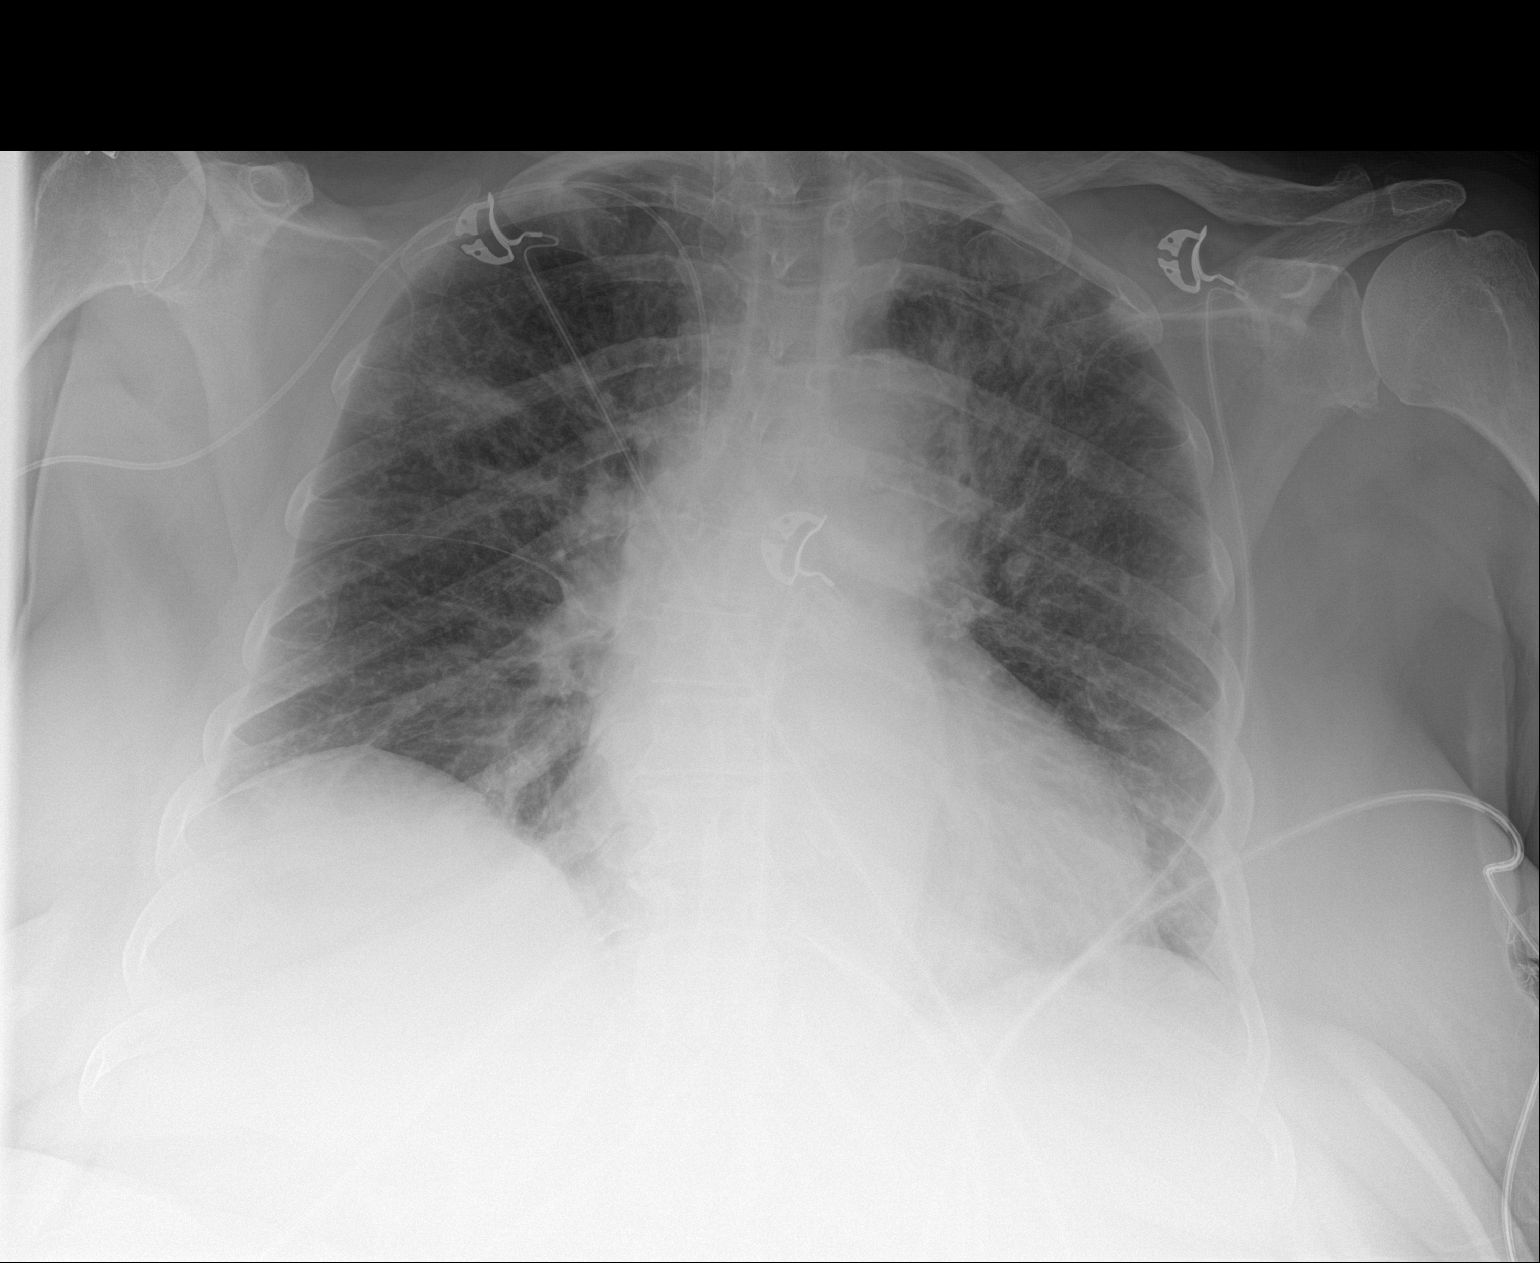

[1 of 1 positions shown; findings below may reference images not displayed]

FINDINGS: Right PICC line stable position. Stable cardiomegaly. Interim slight
improvement of patchy bilateral pulmonary infiltrates. No pleural
effusion or pneumothorax.
IMPRESSION: 1. Right PICC line in stable position.

2.  Interim partial clearing of bilateral pulmonary infiltrates.

## 2016-12-01 ENCOUNTER — Ambulatory Visit: Admission: RE | Admit: 2016-12-01 | Payer: Medicare Other | Source: Ambulatory Visit

## 2016-12-01 DIAGNOSIS — I10 Essential (primary) hypertension: Secondary | ICD-10-CM | POA: Diagnosis not present

## 2016-12-01 DIAGNOSIS — F411 Generalized anxiety disorder: Secondary | ICD-10-CM | POA: Diagnosis not present

## 2016-12-01 DIAGNOSIS — R269 Unspecified abnormalities of gait and mobility: Secondary | ICD-10-CM | POA: Diagnosis not present

## 2016-12-01 DIAGNOSIS — M6281 Muscle weakness (generalized): Secondary | ICD-10-CM | POA: Diagnosis not present

## 2016-12-01 DIAGNOSIS — H04129 Dry eye syndrome of unspecified lacrimal gland: Secondary | ICD-10-CM | POA: Diagnosis not present

## 2016-12-04 DIAGNOSIS — M6281 Muscle weakness (generalized): Secondary | ICD-10-CM | POA: Diagnosis not present

## 2016-12-04 DIAGNOSIS — R269 Unspecified abnormalities of gait and mobility: Secondary | ICD-10-CM | POA: Diagnosis not present

## 2016-12-04 DIAGNOSIS — I10 Essential (primary) hypertension: Secondary | ICD-10-CM | POA: Diagnosis not present

## 2016-12-04 DIAGNOSIS — F411 Generalized anxiety disorder: Secondary | ICD-10-CM | POA: Diagnosis not present

## 2016-12-04 DIAGNOSIS — H04129 Dry eye syndrome of unspecified lacrimal gland: Secondary | ICD-10-CM | POA: Diagnosis not present

## 2016-12-06 DIAGNOSIS — M6281 Muscle weakness (generalized): Secondary | ICD-10-CM | POA: Diagnosis not present

## 2016-12-06 DIAGNOSIS — H04129 Dry eye syndrome of unspecified lacrimal gland: Secondary | ICD-10-CM | POA: Diagnosis not present

## 2016-12-06 DIAGNOSIS — I10 Essential (primary) hypertension: Secondary | ICD-10-CM | POA: Diagnosis not present

## 2016-12-06 DIAGNOSIS — F411 Generalized anxiety disorder: Secondary | ICD-10-CM | POA: Diagnosis not present

## 2016-12-06 DIAGNOSIS — R269 Unspecified abnormalities of gait and mobility: Secondary | ICD-10-CM | POA: Diagnosis not present

## 2016-12-12 ENCOUNTER — Ambulatory Visit: Payer: Medicare Other

## 2016-12-12 ENCOUNTER — Other Ambulatory Visit: Payer: Self-pay | Admitting: Family Medicine

## 2016-12-12 DIAGNOSIS — H04129 Dry eye syndrome of unspecified lacrimal gland: Secondary | ICD-10-CM | POA: Diagnosis not present

## 2016-12-12 DIAGNOSIS — F411 Generalized anxiety disorder: Secondary | ICD-10-CM | POA: Diagnosis not present

## 2016-12-12 DIAGNOSIS — M6281 Muscle weakness (generalized): Secondary | ICD-10-CM | POA: Diagnosis not present

## 2016-12-12 DIAGNOSIS — I10 Essential (primary) hypertension: Secondary | ICD-10-CM | POA: Diagnosis not present

## 2016-12-12 DIAGNOSIS — R269 Unspecified abnormalities of gait and mobility: Secondary | ICD-10-CM | POA: Diagnosis not present

## 2016-12-13 DIAGNOSIS — M6281 Muscle weakness (generalized): Secondary | ICD-10-CM | POA: Diagnosis not present

## 2016-12-13 DIAGNOSIS — R269 Unspecified abnormalities of gait and mobility: Secondary | ICD-10-CM | POA: Diagnosis not present

## 2016-12-13 DIAGNOSIS — F411 Generalized anxiety disorder: Secondary | ICD-10-CM | POA: Diagnosis not present

## 2016-12-13 DIAGNOSIS — H04129 Dry eye syndrome of unspecified lacrimal gland: Secondary | ICD-10-CM | POA: Diagnosis not present

## 2016-12-13 DIAGNOSIS — I10 Essential (primary) hypertension: Secondary | ICD-10-CM | POA: Diagnosis not present

## 2016-12-14 ENCOUNTER — Telehealth: Payer: Self-pay | Admitting: Family Medicine

## 2016-12-14 NOTE — Telephone Encounter (Signed)
Patient needs to speak with Tracy Espinoza regarding a question she has about her recent script.  [phone]  Thank You

## 2016-12-14 NOTE — Telephone Encounter (Signed)
Pt had questions about metoprolol. Wanted to know when it was started. Appears to have been started by hospitalitis during admission in November of 2017. Pt wanted to know if she could d/c, advised that she would need follow for provider to make that decision. Pt has f/u w/ Dr. Jeananne Rama scheduled for 01/01/17. Will continue taking medication until then. Will route to a provider for any other recommendations.

## 2016-12-14 NOTE — Telephone Encounter (Signed)
Will need to wait for MAC. Thanks!

## 2016-12-18 DIAGNOSIS — M6281 Muscle weakness (generalized): Secondary | ICD-10-CM | POA: Diagnosis not present

## 2016-12-18 DIAGNOSIS — R269 Unspecified abnormalities of gait and mobility: Secondary | ICD-10-CM | POA: Diagnosis not present

## 2016-12-18 DIAGNOSIS — I10 Essential (primary) hypertension: Secondary | ICD-10-CM | POA: Diagnosis not present

## 2016-12-18 DIAGNOSIS — F411 Generalized anxiety disorder: Secondary | ICD-10-CM | POA: Diagnosis not present

## 2016-12-18 DIAGNOSIS — H04129 Dry eye syndrome of unspecified lacrimal gland: Secondary | ICD-10-CM | POA: Diagnosis not present

## 2016-12-20 DIAGNOSIS — H04129 Dry eye syndrome of unspecified lacrimal gland: Secondary | ICD-10-CM | POA: Diagnosis not present

## 2016-12-20 DIAGNOSIS — R269 Unspecified abnormalities of gait and mobility: Secondary | ICD-10-CM | POA: Diagnosis not present

## 2016-12-20 DIAGNOSIS — M6281 Muscle weakness (generalized): Secondary | ICD-10-CM | POA: Diagnosis not present

## 2016-12-20 DIAGNOSIS — F411 Generalized anxiety disorder: Secondary | ICD-10-CM | POA: Diagnosis not present

## 2016-12-20 DIAGNOSIS — I10 Essential (primary) hypertension: Secondary | ICD-10-CM | POA: Diagnosis not present

## 2016-12-21 ENCOUNTER — Ambulatory Visit: Payer: Medicare Other | Admitting: Family Medicine

## 2016-12-21 ENCOUNTER — Ambulatory Visit
Admission: RE | Admit: 2016-12-21 | Discharge: 2016-12-21 | Disposition: A | Discharge status: Home / Self Care | Payer: Medicare Other | Source: Ambulatory Visit | Attending: Neurological Surgery | Admitting: Neurological Surgery

## 2016-12-21 DIAGNOSIS — M5126 Other intervertebral disc displacement, lumbar region: Secondary | ICD-10-CM | POA: Insufficient documentation

## 2016-12-21 DIAGNOSIS — M4316 Spondylolisthesis, lumbar region: Secondary | ICD-10-CM

## 2016-12-21 DIAGNOSIS — M4687 Other specified inflammatory spondylopathies, lumbosacral region: Secondary | ICD-10-CM | POA: Insufficient documentation

## 2016-12-21 MED ORDER — GADOBENATE DIMEGLUMINE 529 MG/ML IV SOLN
20.0000 mL | Freq: Once | INTRAVENOUS | Status: AC | PRN
  Administered 2016-12-21: 20 mL via INTRAVENOUS

## 2016-12-22 DIAGNOSIS — H2513 Age-related nuclear cataract, bilateral: Secondary | ICD-10-CM | POA: Diagnosis not present

## 2016-12-26 DIAGNOSIS — B965 Pseudomonas (aeruginosa) (mallei) (pseudomallei) as the cause of diseases classified elsewhere: Secondary | ICD-10-CM | POA: Diagnosis not present

## 2016-12-26 DIAGNOSIS — Z792 Long term (current) use of antibiotics: Secondary | ICD-10-CM | POA: Diagnosis not present

## 2016-12-26 DIAGNOSIS — A4902 Methicillin resistant Staphylococcus aureus infection, unspecified site: Secondary | ICD-10-CM | POA: Diagnosis not present

## 2017-01-01 ENCOUNTER — Encounter: Payer: Self-pay | Admitting: Family Medicine

## 2017-01-01 ENCOUNTER — Ambulatory Visit (INDEPENDENT_AMBULATORY_CARE_PROVIDER_SITE_OTHER): Payer: Medicare Other | Admitting: Family Medicine

## 2017-01-01 VITALS — BP 102/64 | HR 83 | Temp 98.1°F | Wt 264.0 lb

## 2017-01-01 DIAGNOSIS — R918 Other nonspecific abnormal finding of lung field: Secondary | ICD-10-CM

## 2017-01-01 DIAGNOSIS — M545 Low back pain, unspecified: Secondary | ICD-10-CM

## 2017-01-01 DIAGNOSIS — F419 Anxiety disorder, unspecified: Secondary | ICD-10-CM

## 2017-01-01 DIAGNOSIS — E7800 Pure hypercholesterolemia, unspecified: Secondary | ICD-10-CM | POA: Diagnosis not present

## 2017-01-01 DIAGNOSIS — I10 Essential (primary) hypertension: Secondary | ICD-10-CM | POA: Diagnosis not present

## 2017-01-01 DIAGNOSIS — R911 Solitary pulmonary nodule: Secondary | ICD-10-CM | POA: Diagnosis not present

## 2017-01-01 MED ORDER — LORAZEPAM 0.5 MG PO TABS: 0.5000 mg | ORAL_TABLET | Freq: Every day | ORAL | 1 refills | Status: DC | PRN

## 2017-01-01 MED ORDER — METHOCARBAMOL 500 MG PO TABS: 500.0000 mg | ORAL_TABLET | Freq: Four times a day (QID) | ORAL | 1 refills | Status: DC

## 2017-01-01 MED ORDER — METOPROLOL SUCCINATE ER 50 MG PO TB24: 50.0000 mg | ORAL_TABLET | Freq: Every day | ORAL | 1 refills | Status: DC

## 2017-01-01 MED ORDER — TRAMADOL HCL 50 MG PO TABS: 50.0000 mg | ORAL_TABLET | Freq: Two times a day (BID) | ORAL | 2 refills | Status: DC | PRN

## 2017-01-01 MED ORDER — TRAZODONE HCL 50 MG PO TABS: 50.0000 mg | ORAL_TABLET | Freq: Every day | ORAL | 2 refills | Status: DC

## 2017-01-01 MED ORDER — LOSARTAN POTASSIUM 100 MG PO TABS: 100.0000 mg | ORAL_TABLET | Freq: Every day | ORAL | 2 refills | Status: DC

## 2017-01-01 MED ORDER — HYDROCHLOROTHIAZIDE 25 MG PO TABS: 25.0000 mg | ORAL_TABLET | Freq: Every day | ORAL | 2 refills | Status: DC

## 2017-01-01 MED ORDER — OMEPRAZOLE 40 MG PO CPDR: 40.0000 mg | DELAYED_RELEASE_CAPSULE | Freq: Two times a day (BID) | ORAL | 4 refills | Status: DC

## 2017-01-01 NOTE — Progress Notes (Signed)
BP 102/64 (BP Location: Left Wrist, Patient Position: Sitting, Cuff Size: Normal)   Pulse 83   Temp 98.1 F (36.7 C)   Wt 264 lb (119.7 kg)   SpO2 95%   BMI 48.29 kg/m    Subjective:    Patient ID: Tracy Espinoza, female    DOB: [birth year], 71 y.o.   MRN: [id number]  HPI: Tracy Espinoza is a 71 y.o. female  Chief Complaint  Patient presents with  . Hypertension   Patient recheck hypertension doing well no complaints from medications blood pressure is up and down mostly on the upper average. This is consistent with chart reviews blood pressure. All the patient is been doing great with weight loss. Is concerned about medications and what she should be taking and possible side effects with amlodipine and leg edema. Patient finally has gotten off antibiotics still being monitored by infectious disease and doing well. Patient been treated by neurosurgery for chronic pain and is been told she is on the last pills of her oxycodone which she is been taking 2-3 a day. This is been ongoing for 6+ months. Patient has been able to taper down her medications without withdrawal. And is down to her last for pills which she will treasure. Is asking about tramadol. Patient's also asking about trazodone to help with sleep has taken that in the past and wants to again. Patient taking lorazepam mostly every day but not every day.  Patient also with pulmonary nodules shown on CT scan patients do follow-up will refer through CT navigator Burgess Estelle for appropriate follow-up and CT scan ordering.  Relevant past medical, surgical, family and social history reviewed and updated as indicated. Interim medical history since our last visit reviewed. Allergies and medications reviewed and updated.  Review of Systems  Constitutional: Negative.   Respiratory: Negative.   Cardiovascular: Negative.     Per HPI unless specifically indicated above     Objective:    BP 102/64 (BP Location: Left Wrist,  Patient Position: Sitting, Cuff Size: Normal)   Pulse 83   Temp 98.1 F (36.7 C)   Wt 264 lb (119.7 kg)   SpO2 95%   BMI 48.29 kg/m   Wt Readings from Last 3 Encounters:  01/01/17 264 lb (119.7 kg)  09/19/16 283 lb (128.4 kg)  08/19/16 297 lb (134.7 kg)    Physical Exam  Constitutional: She is oriented to person, place, and time. She appears well-developed and well-nourished.  HENT:  Head: Normocephalic and atraumatic.  Eyes: Conjunctivae and EOM are normal.  Neck: Normal range of motion.  Cardiovascular: Normal rate, regular rhythm and normal heart sounds.   Pulmonary/Chest: Effort normal and breath sounds normal.  Musculoskeletal: Normal range of motion.  Neurological: She is alert and oriented to person, place, and time.  Skin: No erythema.  Psychiatric: She has a normal mood and affect. Her behavior is normal. Judgment and thought content normal.    Results for orders placed or performed during the hospital encounter of 09/11/16  BUN  Result Value Ref Range   BUN 15 6 - 20 mg/dL  Creatinine, serum  Result Value Ref Range   Creatinine, Ser 0.66 0.44 - 1.00 mg/dL   GFR calc non Af Amer >60 >60 mL/min   GFR calc Af Amer >60 >60 mL/min  Vancomycin, trough  Result Value Ref Range   Vancomycin Tr 17 15 - 20 ug/mL      Assessment & Plan:   Problem List Items Addressed  This Visit      Cardiovascular and Mediastinum   Hypertension - Primary   Relevant Medications   metoprolol succinate (TOPROL-XL) 50 MG 24 hr tablet   losartan (COZAAR) 100 MG tablet   hydrochlorothiazide (HYDRODIURIL) 25 MG tablet   Other Relevant Orders   Basic metabolic panel     Other   Hypercholesteremia   Relevant Medications   metoprolol succinate (TOPROL-XL) 50 MG 24 hr tablet   losartan (COZAAR) 100 MG tablet   hydrochlorothiazide (HYDRODIURIL) 25 MG tablet   Other Relevant Orders   Basic metabolic panel   Anxiety    The current medical regimen is effective;  continue present plan and  medications.       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      Relevant Medications   meloxicam (MOBIC) 15 MG tablet   traMADol (ULTRAM) 50 MG tablet   traZODone (DESYREL) 50 MG tablet   methocarbamol (ROBAXIN) 500 MG tablet   Multiple pulmonary nodules   Incidental pulmonary nodule, greater than or equal to 68mm    Other Visit Diagnoses    Pulmonary nodules/lesions, multiple          Trying to order pulmonary CT follow-up tried multiple diagnosis still getting this from computer about ABN We will turn over to [employer] to try.  Follow up plan: Return in about 6 months (around 07/03/2017) for Physical Exam.

## 2017-01-01 NOTE — Assessment & Plan Note (Signed)
The current medical regimen is effective;  continue present plan and medications.  

## 2017-01-01 NOTE — Assessment & Plan Note (Signed)
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

## 2017-01-02 ENCOUNTER — Encounter: Payer: Self-pay | Admitting: Family Medicine

## 2017-01-02 LAB — BASIC METABOLIC PANEL
BUN / CREAT RATIO: 30 — AB (ref 12–28)
BUN: 24 mg/dL (ref 8–27)
CHLORIDE: 98 mmol/L (ref 96–106)
CO2: 25 mmol/L (ref 18–29)
Calcium: 9.8 mg/dL (ref 8.7–10.3)
Creatinine, Ser: 0.81 mg/dL (ref 0.57–1.00)
GFR calc Af Amer: 85 mL/min/{1.73_m2} (ref >59)
GFR calc non Af Amer: 73 mL/min/{1.73_m2} (ref >59)
GLUCOSE: 102 mg/dL — AB (ref 65–99)
Potassium: 4.5 mmol/L (ref 3.5–5.2)
SODIUM: 142 mmol/L (ref 134–144)

## 2017-01-09 DIAGNOSIS — B965 Pseudomonas (aeruginosa) (mallei) (pseudomallei) as the cause of diseases classified elsewhere: Secondary | ICD-10-CM | POA: Diagnosis not present

## 2017-01-09 DIAGNOSIS — A4902 Methicillin resistant Staphylococcus aureus infection, unspecified site: Secondary | ICD-10-CM | POA: Diagnosis not present

## 2017-01-09 DIAGNOSIS — T814XXA Infection following a procedure, initial encounter: Secondary | ICD-10-CM | POA: Diagnosis not present

## 2017-01-09 DIAGNOSIS — L089 Local infection of the skin and subcutaneous tissue, unspecified: Secondary | ICD-10-CM | POA: Diagnosis not present

## 2017-01-23 DIAGNOSIS — B965 Pseudomonas (aeruginosa) (mallei) (pseudomallei) as the cause of diseases classified elsewhere: Secondary | ICD-10-CM | POA: Diagnosis not present

## 2017-01-23 DIAGNOSIS — L089 Local infection of the skin and subcutaneous tissue, unspecified: Secondary | ICD-10-CM | POA: Diagnosis not present

## 2017-01-23 DIAGNOSIS — T814XXD Infection following a procedure, subsequent encounter: Secondary | ICD-10-CM | POA: Diagnosis not present

## 2017-01-23 DIAGNOSIS — T148XXA Other injury of unspecified body region, initial encounter: Secondary | ICD-10-CM | POA: Diagnosis not present

## 2017-01-23 DIAGNOSIS — M545 Low back pain: Secondary | ICD-10-CM | POA: Diagnosis not present

## 2017-01-25 ENCOUNTER — Other Ambulatory Visit: Payer: Self-pay | Admitting: Infectious Diseases

## 2017-01-25 DIAGNOSIS — A498 Other bacterial infections of unspecified site: Secondary | ICD-10-CM

## 2017-01-25 DIAGNOSIS — M545 Low back pain, unspecified: Secondary | ICD-10-CM

## 2017-01-31 ENCOUNTER — Ambulatory Visit
Admission: RE | Admit: 2017-01-31 | Discharge: 2017-01-31 | Disposition: A | Discharge status: Home / Self Care | Payer: Medicare Other | Source: Ambulatory Visit | Attending: Infectious Diseases | Admitting: Infectious Diseases

## 2017-01-31 DIAGNOSIS — B965 Pseudomonas (aeruginosa) (mallei) (pseudomallei) as the cause of diseases classified elsewhere: Secondary | ICD-10-CM | POA: Insufficient documentation

## 2017-01-31 DIAGNOSIS — L089 Local infection of the skin and subcutaneous tissue, unspecified: Secondary | ICD-10-CM | POA: Insufficient documentation

## 2017-01-31 DIAGNOSIS — T148XXA Other injury of unspecified body region, initial encounter: Secondary | ICD-10-CM | POA: Diagnosis not present

## 2017-01-31 DIAGNOSIS — Z9889 Other specified postprocedural states: Secondary | ICD-10-CM | POA: Insufficient documentation

## 2017-01-31 DIAGNOSIS — M545 Low back pain, unspecified: Secondary | ICD-10-CM

## 2017-01-31 DIAGNOSIS — M48061 Spinal stenosis, lumbar region without neurogenic claudication: Secondary | ICD-10-CM | POA: Diagnosis not present

## 2017-01-31 DIAGNOSIS — X58XXXA Exposure to other specified factors, initial encounter: Secondary | ICD-10-CM | POA: Insufficient documentation

## 2017-01-31 DIAGNOSIS — M7989 Other specified soft tissue disorders: Secondary | ICD-10-CM | POA: Diagnosis not present

## 2017-01-31 DIAGNOSIS — A498 Other bacterial infections of unspecified site: Secondary | ICD-10-CM

## 2017-01-31 DIAGNOSIS — M5126 Other intervertebral disc displacement, lumbar region: Secondary | ICD-10-CM | POA: Diagnosis not present

## 2017-01-31 MED ORDER — GADOBENATE DIMEGLUMINE 529 MG/ML IV SOLN
20.0000 mL | Freq: Once | INTRAVENOUS | Status: AC | PRN
  Administered 2017-01-31: 20 mL via INTRAVENOUS

## 2017-02-01 ENCOUNTER — Ambulatory Visit: Payer: Medicare (Managed Care)

## 2017-02-07 ENCOUNTER — Other Ambulatory Visit: Payer: Self-pay | Admitting: Family Medicine

## 2017-02-07 NOTE — Telephone Encounter (Signed)
Your patient 

## 2017-02-09 DIAGNOSIS — T148XXA Other injury of unspecified body region, initial encounter: Secondary | ICD-10-CM | POA: Diagnosis not present

## 2017-02-09 DIAGNOSIS — B965 Pseudomonas (aeruginosa) (mallei) (pseudomallei) as the cause of diseases classified elsewhere: Secondary | ICD-10-CM | POA: Diagnosis not present

## 2017-02-09 DIAGNOSIS — L089 Local infection of the skin and subcutaneous tissue, unspecified: Secondary | ICD-10-CM | POA: Diagnosis not present

## 2017-02-20 DIAGNOSIS — Z792 Long term (current) use of antibiotics: Secondary | ICD-10-CM | POA: Diagnosis not present

## 2017-02-20 DIAGNOSIS — A4902 Methicillin resistant Staphylococcus aureus infection, unspecified site: Secondary | ICD-10-CM | POA: Diagnosis not present

## 2017-02-20 DIAGNOSIS — T814XXD Infection following a procedure, subsequent encounter: Secondary | ICD-10-CM | POA: Diagnosis not present

## 2017-02-20 DIAGNOSIS — B965 Pseudomonas (aeruginosa) (mallei) (pseudomallei) as the cause of diseases classified elsewhere: Secondary | ICD-10-CM | POA: Diagnosis not present

## 2017-02-22 DIAGNOSIS — M4316 Spondylolisthesis, lumbar region: Secondary | ICD-10-CM | POA: Diagnosis not present

## 2017-02-28 ENCOUNTER — Ambulatory Visit (INDEPENDENT_AMBULATORY_CARE_PROVIDER_SITE_OTHER): Payer: Medicare Other

## 2017-02-28 VITALS — BP 142/82 | HR 62 | Temp 97.3°F | Resp 16 | Ht 62.0 in | Wt 268.0 lb

## 2017-02-28 DIAGNOSIS — Z1159 Encounter for screening for other viral diseases: Secondary | ICD-10-CM

## 2017-02-28 DIAGNOSIS — Z0000 Encounter for general adult medical examination without abnormal findings: Secondary | ICD-10-CM | POA: Diagnosis not present

## 2017-02-28 NOTE — Patient Instructions (Addendum)
Ms. Folden , Thank you for taking time to come for your Medicare Wellness Visit. I appreciate your ongoing commitment to your health goals. Please review the following plan we discussed and let me know if I can assist you in the future.   Screening recommendations/referrals: Colonoscopy: Completed 06/16/2014 Mammogram: completed 01/31/2016, due 04/2017- call and schedule.  Bone Density: completed 915/2015 Recommended yearly ophthalmology/optometry visit for glaucoma screening and checkup Recommended yearly dental visit for hygiene and checkup  Vaccinations: Influenza vaccine: up to date, due 06/2017 Pneumococcal vaccine: completed series Tdap vaccine: up to date Shingles vaccine: up to date   Advanced directives: Please bring a copy at your convenience.   Conditions/risks identified: Recommend drinking 3-4 glasses of water a day.  Next appointment: Follow up with Dr.Crissman on 03/13/2017 at 9:30am. Follow up in one year for your annual wellness exam.    Preventive Care 65 Years and Older, Female Preventive care refers to lifestyle choices and visits with your health care provider that can promote health and wellness. What does preventive care include?  A yearly physical exam. This is also called an annual well check.  Dental exams once or twice a year.  Routine eye exams. Ask your health care provider how often you should have your eyes checked.  Personal lifestyle choices, including:  Daily care of your teeth and gums.  Regular physical activity.  Eating a healthy diet.  Avoiding tobacco and drug use.  Limiting alcohol use.  Practicing safe sex.  Taking low-dose aspirin every day.  Taking vitamin and mineral supplements as recommended by your health care provider. What happens during an annual well check? The services and screenings done by your health care provider during your annual well check will depend on your age, overall health, lifestyle risk factors, and family  history of disease. Counseling  Your health care provider may ask you questions about your:  Alcohol use.  Tobacco use.  Drug use.  Emotional well-being.  Home and relationship well-being.  Sexual activity.  Eating habits.  History of falls.  Memory and ability to understand (cognition).  Work and work Statistician.  Reproductive health. Screening  You may have the following tests or measurements:  Height, weight, and BMI.  Blood pressure.  Lipid and cholesterol levels. These may be checked every 5 years, or more frequently if you are over 83 years old.  Skin check.  Lung cancer screening. You may have this screening every year starting at age 72 if you have a 30-pack-year history of smoking and currently smoke or have quit within the past 15 years.  Fecal occult blood test (FOBT) of the stool. You may have this test every year starting at age 72.  Flexible sigmoidoscopy or colonoscopy. You may have a sigmoidoscopy every 5 years or a colonoscopy every 10 years starting at age 80.  Hepatitis C blood test.  Hepatitis B blood test.  Sexually transmitted disease (STD) testing.  Diabetes screening. This is done by checking your blood sugar (glucose) after you have not eaten for a while (fasting). You may have this done every 1-3 years.  Bone density scan. This is done to screen for osteoporosis. You may have this done starting at age 44.  Mammogram. This may be done every 1-2 years. Talk to your health care provider about how often you should have regular mammograms. Talk with your health care provider about your test results, treatment options, and if necessary, the need for more tests. Vaccines  Your health care provider  may recommend certain vaccines, such as:  Influenza vaccine. This is recommended every year.  Tetanus, diphtheria, and acellular pertussis (Tdap, Td) vaccine. You may need a Td booster every 10 years.  Zoster vaccine. You may need this after  age 90.  Pneumococcal 13-valent conjugate (PCV13) vaccine. One dose is recommended after age 59.  Pneumococcal polysaccharide (PPSV23) vaccine. One dose is recommended after age 22. Talk to your health care provider about which screenings and vaccines you need and how often you need them. This information is not intended to replace advice given to you by your health care provider. Make sure you discuss any questions you have with your health care provider. Document Released: 10/15/2015 Document Revised: 06/07/2016 Document Reviewed: 07/20/2015 Elsevier Interactive Patient Education  2017 [employer] Prevention in the Home Falls can cause injuries. They can happen to people of all ages. There are many things you can do to make your home safe and to help prevent falls. What can I do on the outside of my home?  Regularly fix the edges of walkways and driveways and fix any cracks.  Remove anything that might make you trip as you walk through a door, such as a raised step or threshold.  Trim any bushes or trees on the path to your home.  Use bright outdoor lighting.  Clear any walking paths of anything that might make someone trip, such as rocks or tools.  Regularly check to see if handrails are loose or broken. Make sure that both sides of any steps have handrails.  Any raised decks and porches should have guardrails on the edges.  Have any leaves, snow, or ice cleared regularly.  Use sand or salt on walking paths during winter.  Clean up any spills in your garage right away. This includes oil or grease spills. What can I do in the bathroom?  Use night lights.  Install grab bars by the toilet and in the tub and shower. Do not use towel bars as grab bars.  Use non-skid mats or decals in the tub or shower.  If you need to sit down in the shower, use a plastic, non-slip stool.  Keep the floor dry. Clean up any water that spills on the floor as soon as it  happens.  Remove soap buildup in the tub or shower regularly.  Attach bath mats securely with double-sided non-slip rug tape.  Do not have throw rugs and other things on the floor that can make you trip. What can I do in the bedroom?  Use night lights.  Make sure that you have a light by your bed that is easy to reach.  Do not use any sheets or blankets that are too big for your bed. They should not hang down onto the floor.  Have a firm chair that has side arms. You can use this for support while you get dressed.  Do not have throw rugs and other things on the floor that can make you trip. What can I do in the kitchen?  Clean up any spills right away.  Avoid walking on wet floors.  Keep items that you use a lot in easy-to-reach places.  If you need to reach something above you, use a strong step stool that has a grab bar.  Keep electrical cords out of the way.  Do not use floor polish or wax that makes floors slippery. If you must use wax, use non-skid floor wax.  Do not have throw rugs  and other things on the floor that can make you trip. What can I do with my stairs?  Do not leave any items on the stairs.  Make sure that there are handrails on both sides of the stairs and use them. Fix handrails that are broken or loose. Make sure that handrails are as long as the stairways.  Check any carpeting to make sure that it is firmly attached to the stairs. Fix any carpet that is loose or worn.  Avoid having throw rugs at the top or bottom of the stairs. If you do have throw rugs, attach them to the floor with carpet tape.  Make sure that you have a light switch at the top of the stairs and the bottom of the stairs. If you do not have them, ask someone to add them for you. What else can I do to help prevent falls?  Wear shoes that:  Do not have high heels.  Have rubber bottoms.  Are comfortable and fit you well.  Are closed at the toe. Do not wear sandals.  If you  use a stepladder:  Make sure that it is fully opened. Do not climb a closed stepladder.  Make sure that both sides of the stepladder are locked into place.  Ask someone to hold it for you, if possible.  Clearly mark and make sure that you can see:  Any grab bars or handrails.  First and last steps.  Where the edge of each step is.  Use tools that help you move around (mobility aids) if they are needed. These include:  Canes.  Walkers.  Scooters.  Crutches.  Turn on the lights when you go into a dark area. Replace any light bulbs as soon as they burn out.  Set up your furniture so you have a clear path. Avoid moving your furniture around.  If any of your floors are uneven, fix them.  If there are any pets around you, be aware of where they are.  Review your medicines with your doctor. Some medicines can make you feel dizzy. This can increase your chance of falling. Ask your doctor what other things that you can do to help prevent falls. This information is not intended to replace advice given to you by your health care provider. Make sure you discuss any questions you have with your health care provider. Document Released: 07/15/2009 Document Revised: 02/24/2016 Document Reviewed: 10/23/2014 Elsevier Interactive Patient Education  2017 [employer].

## 2017-02-28 NOTE — Progress Notes (Signed)
Subjective:   Tracy Espinoza is a 71 y.o. female who presents for Medicare Annual (Subsequent) preventive examination.  Review of Systems:   Cardiac Risk Factors include: advanced age (>30men, >49 women);obesity (BMI >30kg/m2);hypertension;dyslipidemia     Objective:     Vitals: BP (!) 142/82 (BP Location: Left Arm, Patient Position: Sitting)   Pulse 62   Temp 97.3 F (36.3 C)   Resp 16   Ht 5\' 2"  (1.575 m)   Wt 268 lb (121.6 kg)   BMI 49.02 kg/m   Body mass index is 49.02 kg/m.   Tobacco History  Smoking Status  . Never Smoker  Smokeless Tobacco  . Never Used     Counseling given: Not Answered   Past Medical History:  Diagnosis Date  . Anxiety   . Arthritis   . Cancer ([place])    BASAL CELL SKIN CANCERS REMOVED  . GERD (gastroesophageal reflux disease)   . History of hiatal hernia   . Hypertension    Past Surgical History:  Procedure Laterality Date  . ABDOMINAL HYSTERECTOMY    . APPENDECTOMY    . CHOLECYSTECTOMY    . FRACTURE SURGERY     BONE ON SIDE OF RIGHT FOOT  . HERNIA REPAIR     UMBILICAL X 2  . JOINT REPLACEMENT Bilateral    knees  . LUMBAR WOUND DEBRIDEMENT N/A 08/03/2016   Procedure: IRRIGATION AND DEBRIDEMENT LUMBAR WOUND;  Surgeon: Eustace Moore, MD;  Location: [facility];  Service: Neurosurgery;  Laterality: N/A;  . REPAIR OF LEFT URETER Left 40 YRS AGO  . ROTATOR CUFF REPAIR  2014  . TONSILLECTOMY    . TUBAL LIGATION     Family History  Problem Relation Age of Onset  . Hypertension Mother   . Stroke Mother   . Alzheimer's disease Mother   . Cancer Father        brain  . Hypertension Father   . Stroke Father    History  Sexual Activity  . Sexual activity: No    Outpatient Encounter Prescriptions as of 02/28/2017  Medication Sig  . aspirin EC 81 MG tablet Take 81 mg by mouth daily.  . hydrochlorothiazide (HYDRODIURIL) 25 MG tablet Take 1 tablet (25 mg total) by mouth daily.  Marland Kitchen LORazepam (ATIVAN) 0.5 MG tablet Take 1 tablet (0.5 mg  total) by mouth daily as needed for anxiety. Take only for bad days  . losartan (COZAAR) 100 MG tablet Take 1 tablet (100 mg total) by mouth daily.  . Melatonin 10 MG CAPS Take 10 mg by mouth at bedtime.   . meloxicam (MOBIC) 15 MG tablet Take by mouth.  . metoprolol succinate (TOPROL-XL) 50 MG 24 hr tablet Take 1 tablet (50 mg total) by mouth daily.  Marland Kitchen omeprazole (PRILOSEC) 40 MG capsule Take 1 capsule (40 mg total) by mouth 2 (two) times daily.  Marland Kitchen oxyCODONE-acetaminophen (PERCOCET/ROXICET) 5-325 MG tablet Take 1 tablet by mouth every 8 (eight) hours as needed for moderate pain or severe pain. Breakthrough pain  . senna (SENOKOT) 8.6 MG TABS tablet Take 1 tablet (8.6 mg total) by mouth 2 (two) times daily.  . traMADol (ULTRAM) 50 MG tablet Take 1 tablet (50 mg total) by mouth 2 (two) times daily as needed.  . traZODone (DESYREL) 50 MG tablet Take 1 tablet (50 mg total) by mouth at bedtime.  Marland Kitchen acidophilus (RISAQUAD) CAPS capsule Take 1 capsule by mouth daily.  . Ascorbic Acid (VITAMIN C) 1000 MG tablet Take 1,000  mg by mouth daily.  . Calcium Carb-Cholecalciferol (CALCIUM 600+D) 600-800 MG-UNIT TABS Take 1 tablet by mouth daily.   . calcium carbonate (CALTRATE 600) 1500 (600 Ca) MG TABS tablet Take by mouth 2 (two) times daily with a meal.  . Cholecalciferol (D3-1000) 1000 units capsule Take 1,000 Units by mouth daily.  Marland Kitchen conjugated estrogens (PREMARIN) vaginal cream Apply a blueberry sized amount to urethra using fingertip nightly x 2 weeks then 3 times weekly at bedtime. (Patient not taking: Reported on 02/28/2017)  . CRANBERRY PO Take 1-2 capsules by mouth See admin instructions. Take 1 capsule by mouth in the morning and take 2 capsules by mouth in the evening.  Marland Kitchen doxycycline (VIBRAMYCIN) 100 MG capsule   . ipratropium-albuterol (DUONEB) 0.5-2.5 (3) MG/3ML SOLN Take 3 mLs by nebulization every 6 (six) hours.  . magnesium oxide (MAG-OX) 400 MG tablet Take 400 mg by mouth 2 (two) times daily.  .  methocarbamol (ROBAXIN) 500 MG tablet Take 1 tablet (500 mg total) by mouth every 6 (six) hours. (Patient not taking: Reported on 02/28/2017)  . Multiple Vitamin (MULTIVITAMIN) tablet Take 1 tablet by mouth daily.  . Potassium 99 MG TABS Take by mouth.  . vitamin E (VITAMIN E) 400 UNIT capsule Take 400 Units by mouth daily.  . [DISCONTINUED] metoprolol succinate (TOPROL-XL) 25 MG 24 hr tablet TAKE 1 TABLET BY MOUTH EVERY DAY (Patient not taking: Reported on 02/28/2017)   No facility-administered encounter medications on file as of 02/28/2017.     Activities of Daily Living In your present state of health, do you have any difficulty performing the following activities: 02/28/2017 08/19/2016  Hearing? N N  Vision? N N  Difficulty concentrating or making decisions? N N  Walking or climbing stairs? Y Y  Dressing or bathing? N Y  Doing errands, shopping? Y N  Preparing Food and eating ? N -  Using the Toilet? N -  In the past six months, have you accidently leaked urine? Y -  Do you have problems with loss of bowel control? Y -  Managing your Medications? N -  Managing your Finances? N -  Housekeeping or managing your Housekeeping? N -  Some recent data might be hidden    Patient Care Team: Guadalupe Maple, MD as PCP - General (Family Medicine) Lucilla Lame, MD as Consulting Physician (Gastroenterology) Delana Meyer, Dolores Lory, MD (Vascular Surgery) Thornton Park, MD as Referring Physician (Orthopedic Surgery) Yolonda Kida, MD as Consulting Physician (Cardiology) Kristeen Miss, MD as Consulting Physician (Neurosurgery) Leonel Ramsay, MD (Infectious Diseases)    Assessment:     Exercise Activities and Dietary recommendations Current Exercise Habits: The patient does not participate in regular exercise at present, Exercise limited by: orthopedic condition(s)  Goals    . Increase water intake          Recommend drinking 3-4 glasses of water a day.      Fall Risk Fall  Risk  02/28/2017 04/27/2016  Falls in the past year? No No  Risk for fall due to : Impaired balance/gait -   Depression Screen PHQ 2/9 Scores 02/28/2017 02/28/2017 04/27/2016  PHQ - 2 Score 0 0 0  PHQ- 9 Score 5 - -     Cognitive Function     6CIT Screen 02/28/2017  What Year? 0 points  What month? 0 points  What time? 0 points  Count back from 20 0 points  Months in reverse 0 points  Repeat phrase 2 points  Total  Score 2    Immunization History  Administered Date(s) Administered  . Influenza-Unspecified 06/08/2016  . Pneumococcal Conjugate-13 08/04/2014  . Pneumococcal-Unspecified 11/13/2007, 08/25/2013  . Td 08/04/2014  . Zoster 11/13/2007   Screening Tests Health Maintenance  Topic Date Due  . Hepatitis C Screening  03/12/2017 (Originally September 25, 1946)  . INFLUENZA VACCINE  05/02/2017  . MAMMOGRAM  01/27/2018  . COLONOSCOPY  06/16/2024  . TETANUS/TDAP  08/04/2024  . DEXA SCAN  Completed  . PNA vac Low Risk Adult  Completed      Plan:  I have personally reviewed and addressed the Medicare Annual Wellness questionnaire and have noted the following in the patient's chart:  A. Medical and social history B. Use of alcohol, tobacco or illicit drugs  C. Current medications and supplements D. Functional ability and status E.  Nutritional status F.  Physical activity G. Advance directives H. List of other physicians I.  Hospitalizations, surgeries, and ER visits in previous 12 months J.  [place] such as hearing and vision if needed, cognitive and depression L. Referrals and appointments - 03/13/2017 AT 9:30AM with Dr.Crissman  In addition, I have reviewed and discussed with patient certain preventive protocols, quality metrics, and best practice recommendations. A written personalized care plan for preventive services as well as general preventive health recommendations were provided to patient.   Signed,  Tyler Aas, LPN Nurse Health Advisor   MD  Recommendations: does patient need CT-chest scan follow up from previous noted nodule 12/2015.  Order in for hep c screening, pt wants this drawn on 6/12 with other labs   I, as supervising physician, have reviewed the nurse health advisors Medicare wellness visit note for this patient and concur with the findings and recommendations listed above. Golden Pop M.D.

## 2017-03-01 ENCOUNTER — Other Ambulatory Visit: Payer: Self-pay | Admitting: Family Medicine

## 2017-03-01 DIAGNOSIS — Z1231 Encounter for screening mammogram for malignant neoplasm of breast: Secondary | ICD-10-CM

## 2017-03-05 DIAGNOSIS — M4316 Spondylolisthesis, lumbar region: Secondary | ICD-10-CM | POA: Diagnosis not present

## 2017-03-07 DIAGNOSIS — M4316 Spondylolisthesis, lumbar region: Secondary | ICD-10-CM | POA: Diagnosis not present

## 2017-03-13 ENCOUNTER — Encounter: Payer: Self-pay | Admitting: Family Medicine

## 2017-03-13 ENCOUNTER — Ambulatory Visit: Payer: Medicare Other | Admitting: Family Medicine

## 2017-03-13 ENCOUNTER — Ambulatory Visit (INDEPENDENT_AMBULATORY_CARE_PROVIDER_SITE_OTHER): Payer: Medicare Other | Admitting: Family Medicine

## 2017-03-13 VITALS — BP 139/82 | HR 72

## 2017-03-13 DIAGNOSIS — Z1159 Encounter for screening for other viral diseases: Secondary | ICD-10-CM | POA: Diagnosis not present

## 2017-03-13 DIAGNOSIS — I10 Essential (primary) hypertension: Secondary | ICD-10-CM | POA: Diagnosis not present

## 2017-03-13 DIAGNOSIS — Z7189 Other specified counseling: Secondary | ICD-10-CM | POA: Insufficient documentation

## 2017-03-13 DIAGNOSIS — Z131 Encounter for screening for diabetes mellitus: Secondary | ICD-10-CM

## 2017-03-13 DIAGNOSIS — F419 Anxiety disorder, unspecified: Secondary | ICD-10-CM

## 2017-03-13 DIAGNOSIS — J189 Pneumonia, unspecified organism: Secondary | ICD-10-CM | POA: Insufficient documentation

## 2017-03-13 DIAGNOSIS — R911 Solitary pulmonary nodule: Secondary | ICD-10-CM | POA: Diagnosis not present

## 2017-03-13 DIAGNOSIS — R918 Other nonspecific abnormal finding of lung field: Secondary | ICD-10-CM

## 2017-03-13 DIAGNOSIS — Z1329 Encounter for screening for other suspected endocrine disorder: Secondary | ICD-10-CM

## 2017-03-13 DIAGNOSIS — M4316 Spondylolisthesis, lumbar region: Secondary | ICD-10-CM | POA: Diagnosis not present

## 2017-03-13 DIAGNOSIS — Z0000 Encounter for general adult medical examination without abnormal findings: Secondary | ICD-10-CM

## 2017-03-13 DIAGNOSIS — E7800 Pure hypercholesterolemia, unspecified: Secondary | ICD-10-CM

## 2017-03-13 LAB — URINALYSIS, ROUTINE W REFLEX MICROSCOPIC
BILIRUBIN UA: NEGATIVE
Glucose, UA: NEGATIVE
KETONES UA: NEGATIVE
Nitrite, UA: POSITIVE — AB
PH UA: 5.5 (ref 5.0–7.5)
SPEC GRAV UA: 1.015 (ref 1.005–1.030)
Urobilinogen, Ur: 0.2 mg/dL (ref 0.2–1.0)

## 2017-03-13 LAB — MICROSCOPIC EXAMINATION

## 2017-03-13 MED ORDER — METOPROLOL SUCCINATE ER 50 MG PO TB24: 50.0000 mg | ORAL_TABLET | Freq: Every day | ORAL | 4 refills | Status: DC

## 2017-03-13 MED ORDER — HYDROCHLOROTHIAZIDE 25 MG PO TABS: 25.0000 mg | ORAL_TABLET | Freq: Every day | ORAL | 4 refills | Status: DC

## 2017-03-13 MED ORDER — LORAZEPAM 0.5 MG PO TABS: 0.5000 mg | ORAL_TABLET | Freq: Every day | ORAL | 3 refills | Status: DC | PRN

## 2017-03-13 NOTE — Progress Notes (Signed)
BP 139/82   Pulse 72   SpO2 95%    Subjective:    Patient ID: Tracy Espinoza, female    DOB: 19-May-1946, 71 y.o.   MRN: [id number]  HPI: Tracy Espinoza is a 71 y.o. female  Chief Complaint  Patient presents with  . Annual Exam  Did work to get CT follow-up as requested from March 2017 CT for follow-up pulmonary nodules. Had great deal of difficulty getting to write order and right diagnoses to link up so have a lot of different orders and diagnoses for the patient. The patient's really doing just fine. On the other hand Medicare is not.  Patient still having sleep difficulty on review taking melatonin and hour before bed and leave the light on discussed taking at bedtime and no lites.  Taking Percocet from Dr. Ellene Route. Takes one a day. And patient not taking tramadol. 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    Relevant past medical, surgical, family and social history reviewed and updated as indicated. Interim medical history since our last visit reviewed. Allergies and medications reviewed and updated.  Review of Systems  Constitutional: Negative.   HENT: Negative.   Eyes: Negative.   Respiratory: Negative.   Cardiovascular: Negative.   Gastrointestinal: Negative.   Endocrine: Negative.   Genitourinary: Negative.   Musculoskeletal: Negative.   Skin: Negative.   Allergic/Immunologic: Negative.   Neurological: Negative.   Hematological: Negative.   Psychiatric/Behavioral: Negative.     Per HPI unless specifically indicated above     Objective:    BP 139/82   Pulse 72   SpO2 95%   Wt Readings from Last 3 Encounters:  02/28/17 268 lb (121.6 kg)  01/01/17 264 lb (119.7 kg)  09/19/16 283 lb (128.4 kg)    Physical Exam  Constitutional: She is oriented to person, place, and time. She appears well-developed and well-nourished.  HENT:  Head: Normocephalic  and atraumatic.  Right Ear: External ear normal.  Left Ear: External ear normal.  Nose: Nose normal.  Mouth/Throat: Oropharynx is clear and moist.  Eyes: Conjunctivae and EOM are normal. Pupils are equal, round, and reactive to light.  Neck: Normal range of motion. Neck supple. Carotid bruit is not present.  Cardiovascular: Normal rate, regular rhythm and normal heart sounds.   No murmur heard. Pulmonary/Chest: Effort normal and breath sounds normal. She exhibits no mass. Right breast exhibits no mass, no skin change and no tenderness. Left breast exhibits no mass, no skin change and no tenderness. Breasts are symmetrical.  Abdominal: Soft. Bowel sounds are normal. There is no hepatosplenomegaly.  Musculoskeletal: Normal range of motion.  Neurological: She is alert and oriented to person, place, and time.  Skin: No rash noted.  Psychiatric: She has a normal mood and affect. Her behavior is normal. Judgment and thought content normal.    Results for orders placed or performed in visit on 42/87/68  Basic metabolic panel  Result Value Ref Range   Glucose 102 (H) 65 - 99 mg/dL   BUN 24 8 - 27 mg/dL   Creatinine, Ser 0.81 0.57 - 1.00 mg/dL   GFR calc non Af Amer 73 >59 mL/min/1.73   GFR calc Af Amer 85 >59 mL/min/1.73   BUN/Creatinine Ratio 30 (H) 12 - 28   Sodium 142 134 - 144 mmol/L   Potassium 4.5 3.5 - 5.2 mmol/L  Chloride 98 96 - 106 mmol/L   CO2 25 18 - 29 mmol/L   Calcium 9.8 8.7 - 10.3 mg/dL      Assessment & Plan:   Problem List Items Addressed This Visit      Cardiovascular and Mediastinum   Hypertension    The current medical regimen is effective;  continue present plan and medications.       Relevant Medications   hydrochlorothiazide (HYDRODIURIL) 25 MG tablet   metoprolol succinate (TOPROL-XL) 50 MG 24 hr tablet   Other Relevant Orders   CBC with Differential/Platelet   Comprehensive metabolic panel     Other   Hypercholesteremia   Relevant Medications    hydrochlorothiazide (HYDRODIURIL) 25 MG tablet   metoprolol succinate (TOPROL-XL) 50 MG 24 hr tablet   Other Relevant Orders   Comprehensive metabolic panel   Lipid panel   Anxiety   Relevant Medications   LORazepam (ATIVAN) 0.5 MG tablet   Multiple pulmonary nodules    We'll schedule follow-up CT scan hopefully.      Relevant Orders   CT Chest Wo Contrast   Incidental pulmonary nodule, greater than or equal to 13mm   Relevant Orders   CT Chest Wo Contrast   Lung mass   Relevant Orders   CT Chest Wo Contrast   Coin lesion   Relevant Orders   CT Chest Wo Contrast   Advanced care planning/counseling discussion    A voluntary discussion about advance care planning including the explanation and discussion of advance directives was extensively discussed  with the patient.  Explanation about the health care proxy and Living will was reviewed and packet with forms with explanation of how to fill them out was given.Has copleated all forms       Other Visit Diagnoses    Annual physical exam    -  Primary   Screening for diabetes mellitus (DM)       Relevant Orders   Urinalysis, Routine w reflex microscopic   Thyroid disorder screen       Relevant Orders   TSH   Need for hepatitis C screening test       Relevant Orders   Hepatitis C Antibody       Follow up plan: Return in about 6 months (around 09/12/2017) for BMP.

## 2017-03-13 NOTE — Assessment & Plan Note (Signed)
We'll schedule follow-up CT scan hopefully.

## 2017-03-13 NOTE — Assessment & Plan Note (Signed)
The current medical regimen is effective;  continue present plan and medications.  

## 2017-03-13 NOTE — Assessment & Plan Note (Signed)
A voluntary discussion about advance care planning including the explanation and discussion of advance directives was extensively discussed  with the patient.  Explanation about the health care proxy and Living will was reviewed and packet with forms with explanation of how to fill them out was given.Has copleated all forms

## 2017-03-13 NOTE — Patient Instructions (Addendum)
Your CT Scan has been scheduled for 03/15/2017 at 4:30pm (arrive at 4:15pm) at the [facility] off of [address]

## 2017-03-14 ENCOUNTER — Encounter: Payer: Self-pay | Admitting: Family Medicine

## 2017-03-14 LAB — CBC WITH DIFFERENTIAL/PLATELET
Basophils Absolute: 0.1 10*3/uL (ref 0.0–0.2)
Basos: 1 %
EOS (ABSOLUTE): 0.2 10*3/uL (ref 0.0–0.4)
Eos: 2 %
HEMATOCRIT: 39 % (ref 34.0–46.6)
HEMOGLOBIN: 12.5 g/dL (ref 11.1–15.9)
IMMATURE GRANULOCYTES: 0 %
Immature Grans (Abs): 0 10*3/uL (ref 0.0–0.1)
Lymphocytes Absolute: 4.5 10*3/uL — ABNORMAL HIGH (ref 0.7–3.1)
Lymphs: 47 %
MCH: 28 pg (ref 26.6–33.0)
MCHC: 32.1 g/dL (ref 31.5–35.7)
MCV: 87 fL (ref 79–97)
MONOCYTES: 6 %
MONOS ABS: 0.6 10*3/uL (ref 0.1–0.9)
NEUTROS PCT: 44 %
Neutrophils Absolute: 4.2 10*3/uL (ref 1.4–7.0)
Platelets: 255 10*3/uL (ref 150–379)
RBC: 4.46 x10E6/uL (ref 3.77–5.28)
RDW: 14.9 % (ref 12.3–15.4)
WBC: 9.5 10*3/uL (ref 3.4–10.8)

## 2017-03-14 LAB — COMPREHENSIVE METABOLIC PANEL
ALT: 14 IU/L (ref 0–32)
AST: 17 IU/L (ref 0–40)
Albumin/Globulin Ratio: 1.4 (ref 1.2–2.2)
Albumin: 3.9 g/dL (ref 3.5–4.8)
Alkaline Phosphatase: 55 IU/L (ref 39–117)
BUN/Creatinine Ratio: 24 (ref 12–28)
BUN: 23 mg/dL (ref 8–27)
Bilirubin Total: 0.4 mg/dL (ref 0.0–1.2)
CALCIUM: 10.1 mg/dL (ref 8.7–10.3)
CO2: 28 mmol/L (ref 20–29)
CREATININE: 0.96 mg/dL (ref 0.57–1.00)
Chloride: 101 mmol/L (ref 96–106)
GFR calc Af Amer: 68 mL/min/{1.73_m2} (ref >59)
GFR, EST NON AFRICAN AMERICAN: 59 mL/min/{1.73_m2} — AB (ref >59)
GLOBULIN, TOTAL: 2.7 g/dL (ref 1.5–4.5)
Glucose: 111 mg/dL — ABNORMAL HIGH (ref 65–99)
Potassium: 4 mmol/L (ref 3.5–5.2)
SODIUM: 144 mmol/L (ref 134–144)
Total Protein: 6.6 g/dL (ref 6.0–8.5)

## 2017-03-14 LAB — LIPID PANEL
CHOL/HDL RATIO: 3.1 ratio (ref 0.0–4.4)
Cholesterol, Total: 153 mg/dL (ref 100–199)
HDL: 49 mg/dL (ref >39)
LDL CALC: 66 mg/dL (ref 0–99)
TRIGLYCERIDES: 191 mg/dL — AB (ref 0–149)
VLDL CHOLESTEROL CAL: 38 mg/dL (ref 5–40)

## 2017-03-14 LAB — HEPATITIS C ANTIBODY: Hep C Virus Ab: <0.1 s/co ratio (ref 0.0–0.9)

## 2017-03-14 LAB — TSH: TSH: 3.18 u[IU]/mL (ref 0.450–4.500)

## 2017-03-15 ENCOUNTER — Ambulatory Visit: Payer: Medicare Other | Attending: Family Medicine

## 2017-03-15 DIAGNOSIS — M4316 Spondylolisthesis, lumbar region: Secondary | ICD-10-CM | POA: Diagnosis not present

## 2017-03-16 ENCOUNTER — Ambulatory Visit
Admission: RE | Admit: 2017-03-16 | Discharge: 2017-03-16 | Disposition: A | Discharge status: Home / Self Care | Payer: Medicare Other | Source: Ambulatory Visit | Attending: Family Medicine | Admitting: Family Medicine

## 2017-03-16 DIAGNOSIS — Z1231 Encounter for screening mammogram for malignant neoplasm of breast: Secondary | ICD-10-CM | POA: Diagnosis not present

## 2017-03-19 ENCOUNTER — Telehealth: Payer: Self-pay | Admitting: Family Medicine

## 2017-03-19 DIAGNOSIS — M4316 Spondylolisthesis, lumbar region: Secondary | ICD-10-CM | POA: Diagnosis not present

## 2017-03-19 NOTE — Telephone Encounter (Signed)
Patient has some questions regarding her lab results.  [phone]  Thank You

## 2017-03-20 MED ORDER — CIPROFLOXACIN HCL 250 MG PO TABS: 250.0000 mg | ORAL_TABLET | Freq: Two times a day (BID) | ORAL | 0 refills | Status: DC

## 2017-03-20 NOTE — Telephone Encounter (Signed)
Called and spoke to patient about labs. Pt questioning UA results. Please advise.

## 2017-03-20 NOTE — Telephone Encounter (Signed)
Pt aware.

## 2017-03-20 NOTE — Telephone Encounter (Signed)
Looks like she had a UTI when she came in for her Physical according to her urine results. I have sent in some cipro for her to take, if no improvement she should come back in for a urine recheck

## 2017-03-22 DIAGNOSIS — M4316 Spondylolisthesis, lumbar region: Secondary | ICD-10-CM | POA: Diagnosis not present

## 2017-03-27 ENCOUNTER — Ambulatory Visit
Admission: RE | Admit: 2017-03-27 | Discharge: 2017-03-27 | Disposition: A | Discharge status: Home / Self Care | Payer: Medicare Other | Source: Ambulatory Visit | Attending: Family Medicine | Admitting: Family Medicine

## 2017-03-27 ENCOUNTER — Telehealth: Payer: Self-pay | Admitting: Family Medicine

## 2017-03-27 DIAGNOSIS — R918 Other nonspecific abnormal finding of lung field: Secondary | ICD-10-CM | POA: Diagnosis not present

## 2017-03-27 DIAGNOSIS — I700 Atherosclerosis of aorta: Secondary | ICD-10-CM | POA: Diagnosis not present

## 2017-03-27 DIAGNOSIS — D734 Cyst of spleen: Secondary | ICD-10-CM

## 2017-03-27 DIAGNOSIS — I2510 Atherosclerotic heart disease of native coronary artery without angina pectoris: Secondary | ICD-10-CM | POA: Insufficient documentation

## 2017-03-27 DIAGNOSIS — I517 Cardiomegaly: Secondary | ICD-10-CM | POA: Insufficient documentation

## 2017-03-27 DIAGNOSIS — R911 Solitary pulmonary nodule: Secondary | ICD-10-CM

## 2017-03-27 NOTE — Telephone Encounter (Signed)
Phone call Discussed with patient CT scan results showing slight increase of nodules and requesting repeat in 6-12 months we will schedule some time next spring.  Patient also with nonspecific lesion on spleen recommending ultrasound of spleen looking for cystic type structures will set this up.

## 2017-03-28 ENCOUNTER — Other Ambulatory Visit: Payer: Self-pay | Admitting: Family Medicine

## 2017-03-28 ENCOUNTER — Telehealth: Payer: Self-pay

## 2017-03-28 DIAGNOSIS — D734 Cyst of spleen: Secondary | ICD-10-CM

## 2017-03-28 NOTE — Telephone Encounter (Signed)
Patient notified

## 2017-03-28 NOTE — Telephone Encounter (Signed)
Tried calling patient to notify her of her U/S appointment. No answer, LVM for patient to return my call.  04/03/2017 at 8:45 (arrive at 8:30) at [facility] off [address]. No food/drink after midnight.

## 2017-03-29 DIAGNOSIS — M4316 Spondylolisthesis, lumbar region: Secondary | ICD-10-CM | POA: Diagnosis not present

## 2017-04-02 DIAGNOSIS — Z85828 Personal history of other malignant neoplasm of skin: Secondary | ICD-10-CM | POA: Diagnosis not present

## 2017-04-02 DIAGNOSIS — D229 Melanocytic nevi, unspecified: Secondary | ICD-10-CM | POA: Diagnosis not present

## 2017-04-02 DIAGNOSIS — Z1283 Encounter for screening for malignant neoplasm of skin: Secondary | ICD-10-CM | POA: Diagnosis not present

## 2017-04-02 DIAGNOSIS — D485 Neoplasm of uncertain behavior of skin: Secondary | ICD-10-CM | POA: Diagnosis not present

## 2017-04-02 DIAGNOSIS — I8393 Asymptomatic varicose veins of bilateral lower extremities: Secondary | ICD-10-CM | POA: Diagnosis not present

## 2017-04-02 DIAGNOSIS — D1800 Hemangioma unspecified site: Secondary | ICD-10-CM | POA: Diagnosis not present

## 2017-04-02 DIAGNOSIS — L720 Epidermal cyst: Secondary | ICD-10-CM | POA: Diagnosis not present

## 2017-04-02 DIAGNOSIS — L821 Other seborrheic keratosis: Secondary | ICD-10-CM | POA: Diagnosis not present

## 2017-04-02 DIAGNOSIS — I781 Nevus, non-neoplastic: Secondary | ICD-10-CM | POA: Diagnosis not present

## 2017-04-03 ENCOUNTER — Telehealth: Payer: Self-pay | Admitting: Family Medicine

## 2017-04-03 ENCOUNTER — Ambulatory Visit
Admission: RE | Admit: 2017-04-03 | Discharge: 2017-04-03 | Disposition: A | Discharge status: Home / Self Care | Payer: Medicare Other | Source: Ambulatory Visit | Attending: Family Medicine | Admitting: Family Medicine

## 2017-04-03 DIAGNOSIS — M4316 Spondylolisthesis, lumbar region: Secondary | ICD-10-CM | POA: Diagnosis not present

## 2017-04-03 DIAGNOSIS — D734 Cyst of spleen: Secondary | ICD-10-CM | POA: Insufficient documentation

## 2017-04-03 NOTE — Telephone Encounter (Signed)
Phone call Discussed with patient previous discussion had with GI that splenic cyst is likely nothing and just repeat imaging again when CT scans done in a year or so.

## 2017-04-06 DIAGNOSIS — M4316 Spondylolisthesis, lumbar region: Secondary | ICD-10-CM | POA: Diagnosis not present

## 2017-04-11 DIAGNOSIS — M4316 Spondylolisthesis, lumbar region: Secondary | ICD-10-CM | POA: Diagnosis not present

## 2017-04-18 DIAGNOSIS — M4316 Spondylolisthesis, lumbar region: Secondary | ICD-10-CM | POA: Diagnosis not present

## 2017-04-20 DIAGNOSIS — M4316 Spondylolisthesis, lumbar region: Secondary | ICD-10-CM | POA: Diagnosis not present

## 2017-04-20 DIAGNOSIS — A4902 Methicillin resistant Staphylococcus aureus infection, unspecified site: Secondary | ICD-10-CM | POA: Diagnosis not present

## 2017-04-20 DIAGNOSIS — B965 Pseudomonas (aeruginosa) (mallei) (pseudomallei) as the cause of diseases classified elsewhere: Secondary | ICD-10-CM | POA: Diagnosis not present

## 2017-04-24 DIAGNOSIS — M4316 Spondylolisthesis, lumbar region: Secondary | ICD-10-CM | POA: Diagnosis not present

## 2017-04-25 ENCOUNTER — Other Ambulatory Visit: Payer: Self-pay

## 2017-04-25 NOTE — Patient Outreach (Signed)
[facility]) Care Management  04/25/2017  Tracy Espinoza Mar 17, 1946 [id number]     Telephone Screen  Referral Date: 04/24/17 Referral Source: EMMI Prevent Call Referral Reason:  "HTN" Insurance: Medicare   Outreach attempt # 1 to patient. Spoke with patient and screening completed.   Social: Patient resides in her home alone. She is independent with ADLs/IADLs. She drives herself to medical appts. Denies any recent falls. She reports that she goes to outpatient PT 2x/wk since she had complicated back surgery a few months ago.   Conditions: Per records she has PMH of splenic cyst, mx pulmonary nodules, HTN, MRSA, HLD, anxiety, arthritis, hiatal hernia and basal cell skin CA. Patient denies HTN and states she does not monitor BP. She voices that her medical issues are under control and she is knowledgeable regarding them.   Medications:Patient voices that she is taking about 20 meds. She reports that she has multiple questions and concerns regarding timing of when she should space out meds and take them. She also reports concerns regarding some SEs she is experiencing form meds. Patient states she has been on Cipro and Doxycyline for several months and was recently taken off Cipro. She continue to take the other antibiotic. She voices she is having diarrhea and nausea consistently after taking med. She reports does was recently adjusted but SEs persist. She states she has been told by MD's that she will "probably be on med the rest of her life." She voices that she has become "aggravated" with her OTC meds as she can't not decide on a schedule as to when and how to take them without them interfering with her prescription meds. She had stopped taking all OTC meds but recently decided that she should gradually resume taking some of them. Patient voices a lot of frustrations with her meds. She is filling her med planner weekly independently. However, she denies any issues with  being able to afford meds.    Appointments: She saw PCP on 03/13/17. Patient goes for outpatient physical therapy 2x/ wk. Next appts are on 04/27/17-2pm and 05/01/17 & 05/03/17.   Consent: [facility] services reviewed and discussed. Patient does not fel like she needs any RN or SW services. She is requested pharmacy assistance only.    Plan: RN CM will notify [facility] administrative assistant of case status. RN CM will send [facility] referral for polypharmacy med review and med questions assistance.   Enzo Montgomery, RN,BSN,CCM [facility] Telephonic Care Management Coordinator Direct Phone: [phone] Toll Free: [phone] Fax: [phone]

## 2017-04-27 DIAGNOSIS — M4316 Spondylolisthesis, lumbar region: Secondary | ICD-10-CM | POA: Diagnosis not present

## 2017-05-01 DIAGNOSIS — M4316 Spondylolisthesis, lumbar region: Secondary | ICD-10-CM | POA: Diagnosis not present

## 2017-05-03 ENCOUNTER — Other Ambulatory Visit: Payer: Self-pay | Admitting: Pharmacist

## 2017-05-03 DIAGNOSIS — M4316 Spondylolisthesis, lumbar region: Secondary | ICD-10-CM | POA: Diagnosis not present

## 2017-05-03 NOTE — Patient Outreach (Signed)
[facility]) Care Management  05/03/2017  Tracy Espinoza May 09, 1946 [id number]   71 year old female referred to [facility] for medication review by [facility] RN Kandra Nicolas [place].  Per referral, patient has questions regarding how to take her medications and medication side effects.   PMHx includes, but not limited to, HTN, HLD, anxiety, anemia, obesity, basal cell skin cancer, GERD, hiatal hernia, multiple pulmonary nodules with scheduled follow-up CT scan in August, and history of multiple back surgeries complicated by hospitalization in November 2017 with MRSA bacteremia, pseudomonas wound infection, UTI, and pneumonia now on chronic suppressive antibiotic therapy.    Unsuccessful initial outreach call to patient.  Left HIPAA compliant voicemail.   Plan: Follow-up with patient next week regarding her medications unless I hear back from patient beforehand.   Ralene Bathe, PharmD, [place] [phone]    s

## 2017-05-07 ENCOUNTER — Ambulatory Visit: Payer: Self-pay | Admitting: Pharmacist

## 2017-05-07 ENCOUNTER — Other Ambulatory Visit: Payer: Self-pay | Admitting: Pharmacist
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
## 2017-05-08 DIAGNOSIS — M4316 Spondylolisthesis, lumbar region: Secondary | ICD-10-CM | POA: Diagnosis not present

## 2017-05-11 ENCOUNTER — Other Ambulatory Visit: Payer: Self-pay | Admitting: Neurological Surgery

## 2017-05-11 DIAGNOSIS — M4316 Spondylolisthesis, lumbar region: Secondary | ICD-10-CM | POA: Diagnosis not present

## 2017-05-17 DIAGNOSIS — M7542 Impingement syndrome of left shoulder: Secondary | ICD-10-CM | POA: Diagnosis not present

## 2017-05-17 DIAGNOSIS — M7541 Impingement syndrome of right shoulder: Secondary | ICD-10-CM | POA: Diagnosis not present

## 2017-05-21 DIAGNOSIS — A4902 Methicillin resistant Staphylococcus aureus infection, unspecified site: Secondary | ICD-10-CM | POA: Diagnosis not present

## 2017-05-21 DIAGNOSIS — T148XXA Other injury of unspecified body region, initial encounter: Secondary | ICD-10-CM | POA: Diagnosis not present

## 2017-05-21 DIAGNOSIS — B965 Pseudomonas (aeruginosa) (mallei) (pseudomallei) as the cause of diseases classified elsewhere: Secondary | ICD-10-CM | POA: Diagnosis not present

## 2017-05-21 DIAGNOSIS — L089 Local infection of the skin and subcutaneous tissue, unspecified: Secondary | ICD-10-CM | POA: Diagnosis not present

## 2017-05-21 DIAGNOSIS — T814XXD Infection following a procedure, subsequent encounter: Secondary | ICD-10-CM | POA: Diagnosis not present

## 2017-05-24 ENCOUNTER — Ambulatory Visit
Admission: RE | Admit: 2017-05-24 | Discharge: 2017-05-24 | Disposition: A | Discharge status: Home / Self Care | Payer: Medicare Other | Source: Ambulatory Visit | Attending: Neurological Surgery | Admitting: Neurological Surgery

## 2017-05-24 DIAGNOSIS — M48061 Spinal stenosis, lumbar region without neurogenic claudication: Secondary | ICD-10-CM | POA: Insufficient documentation

## 2017-05-24 DIAGNOSIS — N289 Disorder of kidney and ureter, unspecified: Secondary | ICD-10-CM | POA: Insufficient documentation

## 2017-05-24 DIAGNOSIS — Q613 Polycystic kidney, unspecified: Secondary | ICD-10-CM | POA: Diagnosis not present

## 2017-05-24 DIAGNOSIS — Z9889 Other specified postprocedural states: Secondary | ICD-10-CM | POA: Diagnosis not present

## 2017-05-24 DIAGNOSIS — M4316 Spondylolisthesis, lumbar region: Secondary | ICD-10-CM | POA: Diagnosis not present

## 2017-05-28 DIAGNOSIS — T814XXD Infection following a procedure, subsequent encounter: Secondary | ICD-10-CM | POA: Diagnosis not present

## 2017-05-28 DIAGNOSIS — A4902 Methicillin resistant Staphylococcus aureus infection, unspecified site: Secondary | ICD-10-CM | POA: Diagnosis not present

## 2017-05-28 DIAGNOSIS — B965 Pseudomonas (aeruginosa) (mallei) (pseudomallei) as the cause of diseases classified elsewhere: Secondary | ICD-10-CM | POA: Diagnosis not present

## 2017-05-28 DIAGNOSIS — L089 Local infection of the skin and subcutaneous tissue, unspecified: Secondary | ICD-10-CM | POA: Diagnosis not present

## 2017-05-31 DIAGNOSIS — Z6841 Body Mass Index (BMI) 40.0 and over, adult: Secondary | ICD-10-CM | POA: Diagnosis not present

## 2017-05-31 DIAGNOSIS — I10 Essential (primary) hypertension: Secondary | ICD-10-CM | POA: Diagnosis not present

## 2017-05-31 DIAGNOSIS — M4316 Spondylolisthesis, lumbar region: Secondary | ICD-10-CM | POA: Diagnosis not present

## 2017-06-02 DIAGNOSIS — Z23 Encounter for immunization: Secondary | ICD-10-CM | POA: Diagnosis not present

## 2017-06-12 ENCOUNTER — Ambulatory Visit: Payer: Self-pay | Admitting: Pharmacist

## 2017-06-12 ENCOUNTER — Other Ambulatory Visit: Payer: Self-pay | Admitting: Pharmacist

## 2017-06-12 NOTE — Patient Outreach (Signed)
[facility]) Care Management  06/12/2017  Kelcie Currie Raker 1945-12-07 [id number]   Unsuccessful telephone outreach call to Tracy Espinoza to review her antibiotic, doxycycline.  I left a HIPAA compliant voicemail on her home phone.   Per review of [facility], Tracy Espinoza had an office visit with Dr. Ola Spurr, infectious disease provider, on 05/28/17, who continued doxycycline once daily with PRN ondansetron.  Recent CT shows diminished fluid collection at site of discectomy infusion with revision.    Plan: Follow-up with Tracy Espinoza later this week unless I hear back from her beforehand.   Ralene Bathe, PharmD, [employer] [phone]

## 2017-06-13 DIAGNOSIS — M7541 Impingement syndrome of right shoulder: Secondary | ICD-10-CM | POA: Diagnosis not present

## 2017-06-13 DIAGNOSIS — M7542 Impingement syndrome of left shoulder: Secondary | ICD-10-CM | POA: Diagnosis not present

## 2017-06-14 ENCOUNTER — Other Ambulatory Visit: Payer: Self-pay

## 2017-06-14 DIAGNOSIS — F419 Anxiety disorder, unspecified: Secondary | ICD-10-CM

## 2017-06-15 ENCOUNTER — Other Ambulatory Visit: Payer: Self-pay | Admitting: Pharmacist

## 2017-06-15 ENCOUNTER — Ambulatory Visit: Payer: Self-pay | Admitting: Pharmacist
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
## 2017-06-15 NOTE — Telephone Encounter (Signed)
This prescription was okay to fill.

## 2017-06-18 MED ORDER — LORAZEPAM 0.5 MG PO TABS: 0.5000 mg | ORAL_TABLET | Freq: Every day | ORAL | 0 refills | Status: DC | PRN

## 2017-06-18 NOTE — Telephone Encounter (Signed)
Routing to provider. MAC said it was ok to fill, but no rx in the chart.

## 2017-06-20 ENCOUNTER — Telehealth: Payer: Self-pay | Admitting: Family Medicine

## 2017-06-20 NOTE — Telephone Encounter (Signed)
Called and spoke to pharmacist. I gave him the reference number and he states that they just needed a new prescription. Written 06/18/17 by Merrie Roof, PA-C so I called it in verbally to the pharmacy.

## 2017-06-20 NOTE — Telephone Encounter (Signed)
Patient's prescription for lorazepam needing verbal preauthorization.    Reference number for [facility] Delivery: [id number]  Fax number: [phone]  Please Advise.  Thank you

## 2017-06-21 ENCOUNTER — Other Ambulatory Visit: Payer: Self-pay | Admitting: Pharmacist

## 2017-06-21 ENCOUNTER — Ambulatory Visit: Payer: Self-pay | Admitting: Pharmacist

## 2017-06-21 NOTE — Patient Outreach (Addendum)
[facility]) Care Management  06/21/2017  Tracy Espinoza [birth year] [id number]  Unsuccessful telephone call to [name] today.  I left a HIPPA compliant voicemail on her home phone.   Plan: Call patient again in the next week.   Ralene Bathe, PharmD, [place] [phone]    Addendum: I received an incoming call from [name]. HIPAA identifiers verified. [name] reports she continues on doxycycline once daily per her infectious disease physician.  She continues to have GI side effects from the antibiotic but she is managing with imodium PRN and zofran PRN.  She denies any further questions or concerns.   Plan: [facility] will close case at this time.  I am happy to assist in the future as needed.   Ralene Bathe, PharmD, [place] [phone]

## 2017-06-22 ENCOUNTER — Ambulatory Visit: Payer: Self-pay | Admitting: Pharmacist

## 2017-07-24 ENCOUNTER — Telehealth: Payer: Self-pay | Admitting: Family Medicine

## 2017-07-24 NOTE — Telephone Encounter (Signed)
Copied from [place] #[id number]. Topic: Quick Communication - See Telephone Encounter >> Jul 24, 2017  9:47 AM Neva Seat wrote: CRM for notification. See Telephone encounter for:  07/24/17.

## 2017-07-26 DIAGNOSIS — I890 Lymphedema, not elsewhere classified: Secondary | ICD-10-CM | POA: Diagnosis not present

## 2017-07-26 DIAGNOSIS — I878 Other specified disorders of veins: Secondary | ICD-10-CM | POA: Diagnosis not present

## 2017-08-07 ENCOUNTER — Encounter (INDEPENDENT_AMBULATORY_CARE_PROVIDER_SITE_OTHER): Payer: Self-pay | Admitting: Vascular Surgery

## 2017-08-07 ENCOUNTER — Ambulatory Visit (INDEPENDENT_AMBULATORY_CARE_PROVIDER_SITE_OTHER): Payer: Medicare Other | Admitting: Vascular Surgery

## 2017-08-07 VITALS — BP 155/86 | HR 66 | Resp 17 | Ht 62.0 in | Wt 271.0 lb

## 2017-08-07 DIAGNOSIS — M48061 Spinal stenosis, lumbar region without neurogenic claudication: Secondary | ICD-10-CM

## 2017-08-07 DIAGNOSIS — R600 Localized edema: Secondary | ICD-10-CM | POA: Diagnosis not present

## 2017-08-07 DIAGNOSIS — M5416 Radiculopathy, lumbar region: Secondary | ICD-10-CM

## 2017-08-07 NOTE — Progress Notes (Signed)
Subjective:    Patient ID: Tracy Espinoza, female    DOB: 25-Dec-1945, 71 y.o.   MRN: [id number] Chief Complaint  Patient presents with  . New Patient (Initial Visit)    BLE Lymphedema and venous stasis   Presents as a new patient at the request of Dr. Vickki Muff for evaluation of bilateral lower extremity pain and swelling. Patient notes a long-standing history of edema to the bilateral lower extremity however  Last few months she feels this has worsened. The patient underwent a recent spinal surgery and states that her swelling worsened after this. At this time, the patient does not engage in conservative therapy. The patient's bilateral lower extremity edema associated with discomfort. She notes her discomfort is mostly in her ankles and feet. She denies any claudication-like symptoms rest pain or ulceration to the lower extremity. He patient denies any surgery or trauma to the bilateral lower extremity. The patient denies any DVT history. The patient's discomfort has progressive the point that she is unable to function on a daily basis this is prompted her to seek medical attention. The patient denies any fever, nausea or vomiting.   Review of Systems  Constitutional: Negative.   HENT: Negative.   Eyes: Negative.   Respiratory: Negative.   Cardiovascular: Positive for leg swelling.       Bilateral lower extremity pain  Gastrointestinal: Negative.   Endocrine: Negative.   Genitourinary: Negative.   Musculoskeletal: Negative.   Skin: Negative.   Allergic/Immunologic: Negative.   Neurological: Negative.   Hematological: Negative.   Psychiatric/Behavioral: Negative.       Objective:   Physical Exam  Constitutional: She is oriented to person, place, and time. She appears well-developed and well-nourished. No distress.  HENT:  Head: Normocephalic and atraumatic.  Eyes: Conjunctivae are normal. Pupils are equal, round, and reactive to light.  Neck: Normal range of motion.    Cardiovascular: Normal rate, regular rhythm, normal heart sounds and intact distal pulses.  Pulses:      Radial pulses are 2+ on the right side, and 2+ on the left side.  Unable to palpate pedal pulses due to body habitus and edema  Pulmonary/Chest: Effort normal.  Musculoskeletal: Normal range of motion. She exhibits edema (moderate bilateral lower extremity nonpitting edema noted).  Neurological: She is alert and oriented to person, place, and time.  Skin: Skin is warm and dry. She is not diaphoretic.  Scattered less than 1 cm varicosities noted bilaterally. There is no cellulitis. There is no stasis dermatitis. There is no skin changes.  Psychiatric: She has a normal mood and affect. Her behavior is normal. Judgment and thought content normal.  Vitals reviewed.  BP (!) 155/86 (BP Location: Right Wrist)   Pulse 66   Resp 17   Ht 5\' 2"  (1.575 m)   Wt 271 lb (122.9 kg)   BMI 49.57 kg/m   Past Medical History:  Diagnosis Date  . Anxiety   . Arthritis   . Cancer ([place])    BASAL CELL SKIN CANCERS REMOVED  . GERD (gastroesophageal reflux disease)   . History of hiatal hernia   . Hypertension    Social History   Socioeconomic History  . Marital status: Divorced    Spouse name: Not on file  . Number of children: Not on file  . Years of education: Not on file  . Highest education level: Not on file  Social Needs  . Financial resource strain: Not on file  . Food insecurity - worry:  Not on file  . Food insecurity - inability: Not on file  . Transportation needs - medical: Not on file  . Transportation needs - non-medical: Not on file  Occupational History  . Occupation: retired  Tobacco Use  . Smoking status: Never Smoker  . Smokeless tobacco: Never Used  Substance and Sexual Activity  . Alcohol use: No  . Drug use: No  . Sexual activity: No  Other Topics Concern  . Not on file  Social History Narrative  . Not on file   Past Surgical History:  Procedure Laterality  Date  . ABDOMINAL HYSTERECTOMY    . APPENDECTOMY    . CHOLECYSTECTOMY    . FRACTURE SURGERY     BONE ON SIDE OF RIGHT FOOT  . HERNIA REPAIR     UMBILICAL X 2  . JOINT REPLACEMENT Bilateral    knees  . REPAIR OF LEFT URETER Left 40 YRS AGO  . ROTATOR CUFF REPAIR  2014  . TONSILLECTOMY    . TUBAL LIGATION     Family History  Problem Relation Age of Onset  . Hypertension Mother   . Stroke Mother   . Alzheimer's disease Mother   . Cancer Father        brain  . Hypertension Father   . Stroke Father    Allergies  Allergen Reactions  . Tape Itching, Dermatitis and Rash  . 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 Band-Aid Plus Antibiotic [Bacitracin-Polymyxin B] Other (See Comments)    Unknown  . Morphine Sulfate Itching  . Vancomycin Rash      Assessment & Plan:  Presents as a new patient at the request of Dr. Vickki Muff for evaluation of bilateral lower extremity pain and swelling. Patient notes a long-standing history of edema to the bilateral lower extremity however  Last few months she feels this has worsened. The patient underwent a recent spinal surgery and states that her swelling worsened after this. At this time, the patient does not engage in conservative therapy. The patient's bilateral lower extremity edema associated with discomfort. She notes her discomfort is mostly in her ankles and feet. She denies any claudication-like symptoms rest pain or ulceration to the lower extremity. He patient denies any surgery or trauma to the bilateral lower extremity. The patient denies any DVT history. The patient's discomfort has progressive the point that she is unable to function on a daily basis this is prompted her to seek medical attention. The patient denies any fever, nausea or vomiting.  1. Bilateral leg edema - New Presents with a long-standing history of bilateral lower extremity edema which has recently worsened. This edema has now  progressed to the point the patient is unable to function on a daily basis Unable to palpate pedal pulses on exam The patient has multiple risk factors for peripheral artery disease and therefore I will bring her back to undergo an ABI to assess for any peripheral artery disease that may be contributing to her discomfort The patient was encouraged to wear graduated compression stockings (20-30 mmHg) on a daily basis. The patient was instructed to begin wearing the stockings first thing in the morning and removing them in the evening. The patient was instructed specifically not to sleep in the stockings. Prescription given.  In addition, behavioral modification including elevation during the day will be initiated. Anti-inflammatories for pain. I will bring the patient back to undergo a bilateral venous duplex exam to rule out any  contributing reflux disease  The patient will follow up in one month to asses conservative management.  Information on chronic venous insufficiency and compression stockings was given to the patient. The patient was instructed to call the office in the interim if any worsening edema or ulcerations to the legs, feet or toes occurs. The patient expresses their understanding.  - VAS [place] ABI WITH/WO TBI; Future - VAS [place] LOWER EXTREMITY VENOUS REFLUX; Future  2. Spinal stenosis of lumbar region with radiculopathy - Stable This can be a significant factor in the patient's bilateral lower extremity pain  Current Outpatient Medications on File Prior to Visit  Medication Sig Dispense Refill  . acetaminophen (ACETAMINOPHEN 8 HOUR) 650 MG CR tablet Take 1,300 mg by mouth every 8 (eight) hours as needed for pain.    . Ascorbic Acid (VITAMIN C) 1000 MG tablet Take 1,000 mg by mouth daily.    Marland Kitchen aspirin EC 81 MG tablet Take 81 mg by mouth daily.    . Calcium Carb-Cholecalciferol (CALCIUM 600+D) 600-800 MG-UNIT TABS Take 1 tablet by mouth daily.     . cetirizine (ZYRTEC) 10 MG tablet  Take 10 mg by mouth daily.    Marland Kitchen CRANBERRY PO Take 3 capsules by mouth.     . doxycycline (VIBRAMYCIN) 100 MG capsule Take 100 mg by mouth at bedtime.     . hydrochlorothiazide (HYDRODIURIL) 25 MG tablet Take 1 tablet (25 mg total) by mouth daily. 90 tablet 4  . Loperamide HCl (IMODIUM PO) Take 2 capsules by mouth 2 (two) times daily as needed.    Marland Kitchen LORazepam (ATIVAN) 0.5 MG tablet Take 1 tablet (0.5 mg total) by mouth daily as needed for anxiety. Take only for bad days 30 tablet 0  . losartan (COZAAR) 100 MG tablet Take 1 tablet (100 mg total) by mouth daily. 90 tablet 2  . meloxicam (MOBIC) 15 MG tablet Take by mouth daily.     . metoprolol succinate (TOPROL-XL) 50 MG 24 hr tablet Take 1 tablet (50 mg total) by mouth daily. 90 tablet 4  . Multiple Vitamin (MULTIVITAMIN) tablet Take 1 tablet by mouth daily.    Marland Kitchen omeprazole (PRILOSEC) 40 MG capsule Take 1 capsule (40 mg total) by mouth 2 (two) times daily. 180 capsule 4  . Probiotic Product ([employer] DAILY PROBIOTIC PO) Take by mouth.    . traZODone (DESYREL) 50 MG tablet Take 1 tablet (50 mg total) by mouth at bedtime. 90 tablet 2  . Melatonin 10 MG CAPS Take 10 mg by mouth at bedtime.     . traMADol (ULTRAM) 50 MG tablet Take 1 tablet (50 mg total) by mouth 2 (two) times daily as needed. (Patient not taking: Reported on 08/07/2017) 60 tablet 2   No current facility-administered medications on file prior to visit.    There are no Patient Instructions on file for this visit. No Follow-up on file.  Flower Franko A Natalina Wieting, PA-C

## 2017-08-17 ENCOUNTER — Other Ambulatory Visit: Payer: Self-pay | Admitting: Family Medicine

## 2017-08-17 DIAGNOSIS — F419 Anxiety disorder, unspecified: Secondary | ICD-10-CM

## 2017-08-17 MED ORDER — LORAZEPAM 0.5 MG PO TABS: 0.5000 mg | ORAL_TABLET | Freq: Every day | ORAL | 0 refills | Status: DC | PRN

## 2017-08-28 DIAGNOSIS — A4902 Methicillin resistant Staphylococcus aureus infection, unspecified site: Secondary | ICD-10-CM | POA: Diagnosis not present

## 2017-08-28 DIAGNOSIS — T148XXA Other injury of unspecified body region, initial encounter: Secondary | ICD-10-CM | POA: Diagnosis not present

## 2017-08-28 DIAGNOSIS — L089 Local infection of the skin and subcutaneous tissue, unspecified: Secondary | ICD-10-CM | POA: Diagnosis not present

## 2017-08-28 DIAGNOSIS — B965 Pseudomonas (aeruginosa) (mallei) (pseudomallei) as the cause of diseases classified elsewhere: Secondary | ICD-10-CM | POA: Diagnosis not present

## 2017-09-12 ENCOUNTER — Ambulatory Visit: Payer: Medicare Other | Admitting: Family Medicine

## 2017-09-17 ENCOUNTER — Encounter: Payer: Self-pay | Admitting: Family Medicine

## 2017-09-17 ENCOUNTER — Ambulatory Visit (INDEPENDENT_AMBULATORY_CARE_PROVIDER_SITE_OTHER): Payer: Medicare Other | Admitting: Family Medicine

## 2017-09-17 VITALS — BP 148/90 | HR 69 | Wt 270.0 lb

## 2017-09-17 DIAGNOSIS — F419 Anxiety disorder, unspecified: Secondary | ICD-10-CM | POA: Diagnosis not present

## 2017-09-17 DIAGNOSIS — M545 Low back pain, unspecified: Secondary | ICD-10-CM

## 2017-09-17 DIAGNOSIS — I10 Essential (primary) hypertension: Secondary | ICD-10-CM

## 2017-09-17 MED ORDER — LORAZEPAM 0.5 MG PO TABS: 0.5000 mg | ORAL_TABLET | Freq: Every day | ORAL | 5 refills | Status: DC

## 2017-09-17 MED ORDER — MIRTAZAPINE 15 MG PO TABS: 15.0000 mg | ORAL_TABLET | Freq: Every day | ORAL | 6 refills | Status: DC

## 2017-09-17 NOTE — Progress Notes (Signed)
BP (!) 148/90 (BP Location: Left Arm)   Pulse 69   Wt 270 lb (122.5 kg)   SpO2 95%   BMI 49.38 kg/m    Subjective:    Patient ID: Tracy Espinoza, female    DOB: [birth year], 71 y.o.   MRN: [id number]  HPI: SUNNY AGUON is a 71 y.o. female  Chief Complaint  Patient presents with  . Hypertension  . Medication Problem    Wants Trazadone to be 100 mg. Wants 6 month supply of Ativan  Patient with ongoing multiple issues primarily poor sleep sleep until 3 4 a.m..  Wakes up 8 AM.  Trazodone does not seem to be doing much.  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 Interim medical history since our last visit reviewed. Allergies and medications reviewed and updated.  Review of Systems  Constitutional: Negative.   Respiratory: Negative.   Cardiovascular: Negative.     Per HPI unless specifically indicated above     Objective:    BP (!) 148/90 (BP Location: Left Arm)   Pulse 69   Wt 270 lb (122.5 kg)   SpO2 95%   BMI 49.38 kg/m   Wt Readings from Last 3 Encounters:  09/17/17 270 lb (122.5 kg)  08/07/17 271 lb (122.9 kg)  02/28/17 268 lb (121.6 kg)    Physical Exam  Constitutional: She is oriented to person, place, and time. She appears well-developed and well-nourished.  HENT:  Head: Normocephalic and atraumatic.  Eyes: Conjunctivae and EOM are normal.  Neck: Normal range of motion.  Cardiovascular: Normal rate, regular rhythm and normal heart sounds.  Pulmonary/Chest: Effort normal and breath sounds normal.  Musculoskeletal: Normal range of motion.  Neurological: She is alert and oriented to person, place, and time.  Skin: No erythema.  Psychiatric: She has a normal mood and affect. Her behavior is normal. Judgment and thought content normal.    Results for orders placed or performed in visit on 03/13/17    Microscopic Examination  Result Value Ref Range   WBC, UA >30 (A) 0 - 5 /hpf   RBC, UA 0-2 0 - 2 /hpf   Epithelial Cells (non renal) 0-10 0 - 10 /hpf   Bacteria, UA Many (A) None seen/Few  CBC with Differential/Platelet  Result Value Ref Range   WBC 9.5 3.4 - 10.8 x10E3/uL   RBC 4.46 3.77 - 5.28 x10E6/uL   Hemoglobin 12.5 11.1 - 15.9 g/dL   Hematocrit 39.0 34.0 - 46.6 %   MCV 87 79 - 97 fL   MCH 28.0 26.6 - 33.0 pg   MCHC 32.1 31.5 - 35.7 g/dL   RDW 14.9 12.3 - 15.4 %   Platelets 255 150 - 379 x10E3/uL   Neutrophils 44 Not Estab. %   Lymphs 47 Not Estab. %   Monocytes 6 Not Estab. %   Eos 2 Not Estab. %   Basos 1 Not Estab. %   Neutrophils Absolute 4.2 1.4 - 7.0 x10E3/uL   Lymphocytes Absolute 4.5 (H) 0.7 - 3.1 x10E3/uL   Monocytes Absolute 0.6 0.1 - 0.9 x10E3/uL   EOS (ABSOLUTE) 0.2 0.0 - 0.4 x10E3/uL   Basophils Absolute 0.1 0.0 - 0.2 x10E3/uL   Immature Granulocytes 0 Not Estab. %   Immature Grans (Abs) 0.0 0.0 - 0.1 x10E3/uL  Comprehensive metabolic panel  Result Value Ref Range  Glucose 111 (H) 65 - 99 mg/dL   BUN 23 8 - 27 mg/dL   Creatinine, Ser 0.96 0.57 - 1.00 mg/dL   GFR calc non Af Amer 59 (L) >59 mL/min/1.73   GFR calc Af Amer 68 >59 mL/min/1.73   BUN/Creatinine Ratio 24 12 - 28   Sodium 144 134 - 144 mmol/L   Potassium 4.0 3.5 - 5.2 mmol/L   Chloride 101 96 - 106 mmol/L   CO2 28 20 - 29 mmol/L   Calcium 10.1 8.7 - 10.3 mg/dL   Total Protein 6.6 6.0 - 8.5 g/dL   Albumin 3.9 3.5 - 4.8 g/dL   Globulin, Total 2.7 1.5 - 4.5 g/dL   Albumin/Globulin Ratio 1.4 1.2 - 2.2   Bilirubin Total 0.4 0.0 - 1.2 mg/dL   Alkaline Phosphatase 55 39 - 117 IU/L   AST 17 0 - 40 IU/L   ALT 14 0 - 32 IU/L  Lipid panel  Result Value Ref Range   Cholesterol, Total 153 100 - 199 mg/dL   Triglycerides 191 (H) 0 - 149 mg/dL   HDL 49 >39 mg/dL   VLDL Cholesterol Cal 38 5 - 40 mg/dL   LDL Calculated 66 0 - 99 mg/dL   Chol/HDL Ratio 3.1 0.0 - 4.4 ratio  TSH  Result Value Ref  Range   TSH 3.180 0.450 - 4.500 uIU/mL  Urinalysis, Routine w reflex microscopic  Result Value Ref Range   Specific Gravity, UA 1.015 1.005 - 1.030   pH, UA 5.5 5.0 - 7.5   Color, UA Yellow Yellow   Appearance Ur Turbid (A) Clear   Leukocytes, UA 3+ (A) Negative   Protein, UA Trace (A) Negative/Trace   Glucose, UA Negative Negative   Ketones, UA Negative Negative   RBC, UA 1+ (A) Negative   Bilirubin, UA Negative Negative   Urobilinogen, Ur 0.2 0.2 - 1.0 mg/dL   Nitrite, UA Positive (A) Negative   Microscopic Examination See below:   Hepatitis C Antibody  Result Value Ref Range   Hep C Virus Ab <0.1 0.0 - 0.9 s/co ratio      Assessment & Plan:   Problem List Items Addressed This Visit      Cardiovascular and Mediastinum   Hypertension - Primary    The current medical regimen is effective;  continue present plan and medications.       Relevant Orders   Basic metabolic panel     Other   Anxiety    The current medical regimen is effective;  continue present plan and medications.       Relevant Medications   mirtazapine (REMERON) 15 MG tablet   LORazepam (ATIVAN) 0.5 MG tablet   Back pain at L4-L5 level       Follow up plan: Return in about 6 months (around 03/18/2018).

## 2017-09-17 NOTE — Assessment & Plan Note (Signed)
The current medical regimen is effective;  continue present plan and medications.  

## 2017-09-18 ENCOUNTER — Encounter: Payer: Self-pay | Admitting: Family Medicine

## 2017-09-18 ENCOUNTER — Ambulatory Visit (INDEPENDENT_AMBULATORY_CARE_PROVIDER_SITE_OTHER): Payer: Medicare Other | Admitting: Vascular Surgery

## 2017-09-18 ENCOUNTER — Encounter (INDEPENDENT_AMBULATORY_CARE_PROVIDER_SITE_OTHER): Payer: Self-pay

## 2017-09-18 ENCOUNTER — Ambulatory Visit (INDEPENDENT_AMBULATORY_CARE_PROVIDER_SITE_OTHER): Payer: Medicare Other

## 2017-09-18 DIAGNOSIS — R600 Localized edema: Secondary | ICD-10-CM

## 2017-09-18 DIAGNOSIS — M5416 Radiculopathy, lumbar region: Principal | ICD-10-CM

## 2017-09-18 DIAGNOSIS — M48061 Spinal stenosis, lumbar region without neurogenic claudication: Secondary | ICD-10-CM

## 2017-09-18 LAB — BASIC METABOLIC PANEL
BUN/Creatinine Ratio: 38 — ABNORMAL HIGH (ref 12–28)
BUN: 27 mg/dL (ref 8–27)
CALCIUM: 9.6 mg/dL (ref 8.7–10.3)
CHLORIDE: 100 mmol/L (ref 96–106)
CO2: 26 mmol/L (ref 20–29)
Creatinine, Ser: 0.72 mg/dL (ref 0.57–1.00)
GFR, EST AFRICAN AMERICAN: 97 mL/min/{1.73_m2} (ref >59)
GFR, EST NON AFRICAN AMERICAN: 84 mL/min/{1.73_m2} (ref >59)
Glucose: 98 mg/dL (ref 65–99)
POTASSIUM: 3.8 mmol/L (ref 3.5–5.2)
SODIUM: 145 mmol/L — AB (ref 134–144)

## 2017-09-19 ENCOUNTER — Telehealth (INDEPENDENT_AMBULATORY_CARE_PROVIDER_SITE_OTHER): Payer: Self-pay | Admitting: Vascular Surgery

## 2017-09-19 NOTE — Telephone Encounter (Signed)
Left a message on the patient's voicemail asking for her to return my call in regard to discussing her ultrasound results from yesterday.

## 2017-09-20 ENCOUNTER — Telehealth: Payer: Self-pay | Admitting: Family Medicine

## 2017-09-20 DIAGNOSIS — M4316 Spondylolisthesis, lumbar region: Secondary | ICD-10-CM | POA: Diagnosis not present

## 2017-09-20 NOTE — Telephone Encounter (Signed)
Copied from [place]. Topic: Quick Communication - See Telephone Encounter >> Sep 20, 2017  9:46 AM Ether Griffins B wrote: CRM for notification. See Telephone encounter for:  Pt was prescribed mirtazapine and she cant take It. It is giving her bad dreams she would like something else called in to [facility]  09/20/17.

## 2017-09-20 NOTE — Telephone Encounter (Signed)
Please advise. Pt requesting medication to replace mirtazapine.

## 2017-09-21 MED ORDER — TRAZODONE HCL 50 MG PO TABS: 50.0000 mg | ORAL_TABLET | Freq: Every day | ORAL | 3 refills | Status: DC

## 2017-09-21 NOTE — Telephone Encounter (Signed)
Called and let patient know what Dr. Wynetta Emery said. Patient verbalized understanding and wants to know if she would be willing to write a prescription for trazodone until Dr. Jeananne Rama returns. Patient states that this is what she was was taking before the medication was changed. Patient would like it sent to [place].

## 2017-09-21 NOTE — Telephone Encounter (Signed)
Routing back to provider's box for him to access when he returns.

## 2017-09-21 NOTE — Telephone Encounter (Signed)
Please let her know that this will have to wait until Dr. Jeananne Rama returns.

## 2017-09-21 NOTE — Telephone Encounter (Signed)
Trazodone sent to her pharmacy

## 2017-09-21 NOTE — Telephone Encounter (Signed)
Called and let patient know that her RX was sent in as requested.

## 2017-09-24 NOTE — Telephone Encounter (Signed)
Call pt when I get back

## 2017-09-28 ENCOUNTER — Telehealth (INDEPENDENT_AMBULATORY_CARE_PROVIDER_SITE_OTHER): Payer: Self-pay | Admitting: Vascular Surgery

## 2017-09-28 NOTE — Telephone Encounter (Signed)
I called the patient on the 19th and left a message asking her to call the office to review her studies.

## 2017-09-28 NOTE — Telephone Encounter (Signed)
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  We initially saw her on August 07, 2017.  The patient will need to engage in conservative therapy (wearing medical grade 1 compression stockings, elevating her legs heart level or higher and remaining active) for approximately 3 months before we could consider applying to her insurance for any laser ablation.  The patient should follow-up in 3 months from her initial consultation date to discuss her progress with conservative therapy and if she would like to move forward with endovenous laser ablation.

## 2017-09-28 NOTE — Telephone Encounter (Signed)
I called the patient back and gave her the information as you advised in the note here. She states that she will follow up with her Infectious Disease doctor to see if the laser ablation is feasible due to her ongoing battle with MRSA. She agreed to wear the compression stockings and to elevate daily. She did not want to set up the 3 month appointment at this time.

## 2017-09-28 NOTE — Telephone Encounter (Signed)
I think she is just now returnong your call, maybe because of the holidays, I'm not sure Tracy Espinoza.

## 2017-09-28 NOTE — Telephone Encounter (Signed)
Patient seen on 12/18 and still has not heard anything as far as her Ultrasound. Would like a call with care plan and findings.

## 2017-10-02 DIAGNOSIS — C4491 Basal cell carcinoma of skin, unspecified: Secondary | ICD-10-CM

## 2017-10-02 HISTORY — DX: Basal cell carcinoma of skin, unspecified: C44.91

## 2017-10-05 ENCOUNTER — Other Ambulatory Visit: Payer: Self-pay | Admitting: Family Medicine

## 2017-10-05 DIAGNOSIS — M545 Low back pain, unspecified: Secondary | ICD-10-CM

## 2017-10-08 NOTE — Telephone Encounter (Signed)
Patient was transferred to provider for telephone conversation.   

## 2017-10-08 NOTE — Addendum Note (Signed)
Addended by: Golden Pop A on: 10/08/2017 12:18 PM   Modules accepted: Orders

## 2017-10-08 NOTE — Telephone Encounter (Signed)
Refill request

## 2017-10-10 ENCOUNTER — Other Ambulatory Visit: Payer: Self-pay | Admitting: Family Medicine

## 2017-10-10 DIAGNOSIS — M545 Low back pain, unspecified: Secondary | ICD-10-CM

## 2017-10-11 NOTE — Telephone Encounter (Signed)
Request for controlled substance 

## 2017-10-31 ENCOUNTER — Other Ambulatory Visit: Payer: Self-pay | Admitting: Family Medicine

## 2017-10-31 MED ORDER — MIRTAZAPINE 15 MG PO TBDP: 15.0000 mg | ORAL_TABLET | Freq: Every day | ORAL | 2 refills | Status: DC

## 2017-10-31 NOTE — Progress Notes (Signed)
Patient does not need tramadol has been taking trazodone for sleep and wants to change to another medication called in Remeron

## 2017-11-05 ENCOUNTER — Telehealth: Payer: Self-pay

## 2017-11-05 ENCOUNTER — Other Ambulatory Visit: Payer: Self-pay | Admitting: Family Medicine

## 2017-11-05 DIAGNOSIS — M545 Low back pain, unspecified: Secondary | ICD-10-CM

## 2017-11-05 NOTE — Telephone Encounter (Signed)
Copied from [place] [id number]. Topic: General - Other >> Nov 05, 2017  1:49 PM Carolyn Stare wrote:      Pt call to say she can not the following medicine  mirtazapine (REMERON SOL-TAB) 15 MG disintegrating tablet   she refused the med at the pharmacy   Pt is asking for TRAZODONE 100MG     would like a call back     [place]

## 2017-11-05 NOTE — Telephone Encounter (Signed)
Trazodone 50 mg refill request Last OV 09/17/17 It looks like there may be a treatment change in her medications.  Not sure so forwarding this to you.   Thanks. [facility] [employer], [place]

## 2017-11-06 MED ORDER — TRAZODONE HCL 50 MG PO TABS: 50.0000 mg | ORAL_TABLET | Freq: Every day | ORAL | 3 refills | Status: DC

## 2017-11-06 NOTE — Telephone Encounter (Signed)
rx sent

## 2017-11-27 DIAGNOSIS — B965 Pseudomonas (aeruginosa) (mallei) (pseudomallei) as the cause of diseases classified elsewhere: Secondary | ICD-10-CM | POA: Diagnosis not present

## 2017-11-27 DIAGNOSIS — L089 Local infection of the skin and subcutaneous tissue, unspecified: Secondary | ICD-10-CM | POA: Diagnosis not present

## 2017-11-27 DIAGNOSIS — A4902 Methicillin resistant Staphylococcus aureus infection, unspecified site: Secondary | ICD-10-CM | POA: Diagnosis not present

## 2017-11-27 DIAGNOSIS — T148XXA Other injury of unspecified body region, initial encounter: Secondary | ICD-10-CM | POA: Diagnosis not present

## 2017-11-27 DIAGNOSIS — T8144XD Sepsis following a procedure, subsequent encounter: Secondary | ICD-10-CM | POA: Diagnosis not present

## 2017-11-30 ENCOUNTER — Other Ambulatory Visit: Payer: Self-pay | Admitting: Neurological Surgery

## 2017-11-30 DIAGNOSIS — M4316 Spondylolisthesis, lumbar region: Secondary | ICD-10-CM

## 2017-12-04 ENCOUNTER — Other Ambulatory Visit: Payer: Self-pay | Admitting: Family Medicine

## 2017-12-04 DIAGNOSIS — I10 Essential (primary) hypertension: Secondary | ICD-10-CM

## 2017-12-04 NOTE — Telephone Encounter (Signed)
Losartan refill Last OV: 09/17/17 Last Refill:01/01/17 #90 2 RF PCP: Golden Pop [facility]acy:[facility]

## 2017-12-17 ENCOUNTER — Ambulatory Visit
Admission: RE | Admit: 2017-12-17 | Discharge: 2017-12-17 | Disposition: A | Discharge status: Home / Self Care | Payer: Medicare Other | Source: Ambulatory Visit | Attending: Neurological Surgery | Admitting: Neurological Surgery

## 2017-12-17 DIAGNOSIS — M48061 Spinal stenosis, lumbar region without neurogenic claudication: Secondary | ICD-10-CM | POA: Diagnosis not present

## 2017-12-17 DIAGNOSIS — M5126 Other intervertebral disc displacement, lumbar region: Secondary | ICD-10-CM | POA: Diagnosis not present

## 2017-12-17 DIAGNOSIS — M4316 Spondylolisthesis, lumbar region: Secondary | ICD-10-CM | POA: Insufficient documentation

## 2017-12-17 DIAGNOSIS — M47812 Spondylosis without myelopathy or radiculopathy, cervical region: Secondary | ICD-10-CM | POA: Diagnosis not present

## 2017-12-18 ENCOUNTER — Encounter: Payer: Self-pay | Admitting: Family Medicine

## 2017-12-18 ENCOUNTER — Ambulatory Visit (INDEPENDENT_AMBULATORY_CARE_PROVIDER_SITE_OTHER): Payer: Medicare Other | Admitting: Family Medicine

## 2017-12-18 ENCOUNTER — Other Ambulatory Visit: Payer: Self-pay | Admitting: Family Medicine

## 2017-12-18 DIAGNOSIS — I10 Essential (primary) hypertension: Secondary | ICD-10-CM | POA: Diagnosis not present

## 2017-12-18 DIAGNOSIS — R918 Other nonspecific abnormal finding of lung field: Secondary | ICD-10-CM

## 2017-12-18 DIAGNOSIS — R600 Localized edema: Secondary | ICD-10-CM

## 2017-12-18 DIAGNOSIS — G4700 Insomnia, unspecified: Secondary | ICD-10-CM | POA: Insufficient documentation

## 2017-12-18 DIAGNOSIS — F5101 Primary insomnia: Secondary | ICD-10-CM

## 2017-12-18 NOTE — Progress Notes (Signed)
There were no vitals taken for this visit.   Subjective:    Patient ID: Tracy Espinoza, female    DOB: 1946-03-29, 72 y.o.   MRN: [id number]  HPI: CLAIRISSA Espinoza is a 72 y.o. female  Patient with multiple conditions. Insomnia has been ongoing long-term.  Patient's tried trazodone and Remeron 15 mg with no real relief.  Also is taking lorazepam 0.5.  Patient states she will may be fall asleep around 3 a.m. to 5 AM and wake up 2 hours later for the day.  Does not sleep during the day is drowsy of course during the day.  Patient does not admit to any pain bothering her no observed or noted snoring or sleep apnea type behavior. Of concern especially with this patient is falling risk.  Patient's had unsuccessful back surgery with chronic infection and a fall could result in significant back damage and trauma.  Hypertension good control no issues with medications.  Anxiety uses lorazepam 0.5 close to every night does not seem to do much for sleep.  Going to see her back doctor tomorrow.  Head CT done yesterday.  Has chest CT with yearly follow-up a year ago recommended.  Will have reviewed by chest CT coordinator to make recommendation on appropriate CT scan to order.  Patient also had a question of spleen abnormality will review also.  Patient also bothered by especially left leg edema due to multiple factors unable to really elevate leg does do leg wraps has appointment with vascular clinic next week.  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  This is doing better by reports.   Relevant past medical, surgical, family and social history reviewed and updated as indicated. Interim medical history since our last visit reviewed. Allergies and medications reviewed and updated.  Review of Systems  Constitutional: Negative.   Respiratory: Negative.   Cardiovascular: Negative.     Per HPI unless specifically indicated above       Objective:    There were no vitals taken for this visit.  Wt Readings from Last 3 Encounters:  12/18/17 270 lb (122.5 kg)  09/17/17 270 lb (122.5 kg)  08/07/17 271 lb (122.9 kg)    Physical Exam  Constitutional: She is oriented to person, place, and time. She appears well-developed and well-nourished.  HENT:  Head: Normocephalic and atraumatic.  Eyes: Conjunctivae and EOM are normal.  Neck: Normal range of motion.  Cardiovascular: Normal rate, regular rhythm and normal heart sounds.  Pulmonary/Chest: Effort normal and breath sounds normal.  Musculoskeletal: Normal range of motion.  Neurological: She is alert and oriented to person, place, and time.  Skin: No erythema.  Marked edema especially left leg with wraps in place  Psychiatric: She has a normal mood and affect. Her behavior is normal. Judgment and thought content normal.    Results for orders placed or performed in visit on 16/07/37  Basic metabolic panel  Result Value Ref Range   Glucose 98 65 - 99 mg/dL   BUN 27 8 - 27 mg/dL   Creatinine, Ser 0.72 0.57 - 1.00 mg/dL   GFR calc non Af Amer 84 >59 mL/min/1.73   GFR calc Af Amer 97 >59 mL/min/1.73   BUN/Creatinine Ratio 38 (H) 12 - 28   Sodium 145 (H) 134 - 144 mmol/L   Potassium 3.8 3.5 - 5.2 mmol/L   Chloride 100 96 - 106 mmol/L   CO2 26 20 - 29 mmol/L  Calcium 9.6 8.7 - 10.3 mg/dL      Assessment & Plan:   Problem List Items Addressed This Visit      Cardiovascular and Mediastinum   Hypertension    The current medical regimen is effective;  continue present plan and medications.         Other   Bilateral leg edema    Has apt next week      Multiple pulmonary nodules    Pending CT chest          Follow up plan: No Follow-up on file.

## 2017-12-18 NOTE — Assessment & Plan Note (Signed)
Will refer for sleep specialist to further evaluate insomnia.

## 2017-12-18 NOTE — Assessment & Plan Note (Signed)
The current medical regimen is effective;  continue present plan and medications.  

## 2017-12-18 NOTE — Assessment & Plan Note (Signed)
Pending CT chest

## 2017-12-18 NOTE — Assessment & Plan Note (Signed)
Has apt next week

## 2017-12-18 NOTE — Addendum Note (Signed)
Addended by: Golden Pop A on: 12/18/2017 04:23 PM   Modules accepted: Orders

## 2017-12-18 NOTE — Progress Notes (Signed)
BP 137/79   Pulse 66   Ht 5\' 2"  (1.575 m)   Wt 270 lb (122.5 kg)   SpO2 98%   BMI 49.38 kg/m    Subjective:    Patient ID: Tracy Espinoza, female    DOB: 04-Dec-1945, 72 y.o.   MRN: [id number]  HPI: Tracy Espinoza is a 72 y.o. female  Chief Complaint  Patient presents with  . Follow-up  . Hypertension  . Anxiety  . Insomnia    Patient with multiple conditions. Insomnia has been ongoing long-term.  Patient's tried trazodone and Remeron 15 mg with no real relief.  Also is taking lorazepam 0.5.  Patient states she will may be fall asleep around 3 a.m. to 5 AM and wake up 2 hours later for the day.  Does not sleep during the day is drowsy of course during the day.  Patient does not admit to any pain bothering her no observed or noted snoring or sleep apnea type behavior. Of concern especially with this patient is falling risk.  Patient's had unsuccessful back surgery with chronic infection and a fall could result in significant back damage and trauma.  Hypertension good control no issues with medications.  Anxiety uses lorazepam 0.5 close to every night does not seem to do much for sleep.  Going to see her back doctor tomorrow.  Head CT done yesterday.  Has chest CT with yearly follow-up a year ago recommended.  Will have reviewed by chest CT coordinator to make recommendation on appropriate CT scan to order.  Patient also had a question of spleen abnormality will review also.  Patient also bothered by especially left leg edema due to multiple factors unable to really elevate leg does do leg wraps has appointment with vascular clinic next week.  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  This is doing better by reports.  Relevant past medical, surgical, family and social history reviewed and updated as indicated. Interim medical history since our last visit reviewed. Allergies and medications reviewed and updated.  Review of  Systems  Constitutional: Negative.   Respiratory: Negative.   Cardiovascular: Negative.     Per HPI unless specifically indicated above     Objective:    BP 137/79   Pulse 66   Ht 5\' 2"  (1.575 m)   Wt 270 lb (122.5 kg)   SpO2 98%   BMI 49.38 kg/m   Wt Readings from Last 3 Encounters:  12/18/17 270 lb (122.5 kg)  09/17/17 270 lb (122.5 kg)  08/07/17 271 lb (122.9 kg)    Physical Exam  Constitutional: She is oriented to person, place, and time. She appears well-developed and well-nourished.  HENT:  Head: Normocephalic and atraumatic.  Eyes: Conjunctivae and EOM are normal.  Neck: Normal range of motion.  Cardiovascular: Normal rate, regular rhythm and normal heart sounds.  Pulmonary/Chest: Effort normal and breath sounds normal.  Musculoskeletal: Normal range of motion.  Neurological: She is alert and oriented to person, place, and time.  Skin: No erythema.  Psychiatric: She has a normal mood and affect. Her behavior is normal. Judgment and thought content normal.  Marked edema especially left leg with wraps in place  Results for orders placed or performed in visit on 03/00/92  Basic metabolic panel  Result Value Ref Range   Glucose 98 65 - 99 mg/dL   BUN 27 8 - 27 mg/dL   Creatinine, Ser 0.72 0.57 - 1.00 mg/dL  GFR calc non Af Amer 84 >59 mL/min/1.73   GFR calc Af Amer 97 >59 mL/min/1.73   BUN/Creatinine Ratio 38 (H) 12 - 28   Sodium 145 (H) 134 - 144 mmol/L   Potassium 3.8 3.5 - 5.2 mmol/L   Chloride 100 96 - 106 mmol/L   CO2 26 20 - 29 mmol/L   Calcium 9.6 8.7 - 10.3 mg/dL      Assessment & Plan:   Problem List Items Addressed This Visit    None       Cardiovascular and Mediastinum   Hypertension    The current medical regimen is effective;  continue present plan and medications.         Other   Bilateral leg edema    Has apt next week      Multiple pulmonary nodules    Pending CT chest          Follow up plan: Return in about 3 months  (around 03/20/2018) for Physical Exam.

## 2017-12-18 NOTE — Assessment & Plan Note (Signed)
Chest CT pending 

## 2017-12-19 ENCOUNTER — Other Ambulatory Visit: Payer: Self-pay | Admitting: Family Medicine

## 2017-12-19 ENCOUNTER — Telehealth: Payer: Self-pay | Admitting: Family Medicine

## 2017-12-19 DIAGNOSIS — Z122 Encounter for screening for malignant neoplasm of respiratory organs: Secondary | ICD-10-CM

## 2017-12-19 DIAGNOSIS — R0789 Other chest pain: Secondary | ICD-10-CM

## 2017-12-19 DIAGNOSIS — S32009K Unspecified fracture of unspecified lumbar vertebra, subsequent encounter for fracture with nonunion: Secondary | ICD-10-CM | POA: Diagnosis not present

## 2017-12-19 NOTE — Telephone Encounter (Signed)
-----   Message from Lieutenant Diego, RN sent at 12/19/2017  9:28 AM EDT ----- Regarding: RE: chest CT Yes Sir, definitely do a repeat CT chest without contrast to follow up on the nodules. 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   Obviously I don't know the patient, but even if she is reluctant to pursue following the lung nodules because of not being willing or able to undergo a surgery, SBRT radiation is a good 2nd option with good results for curative treatment.  Thanks, Shawn ----- Message ----- From: Guadalupe Maple, MD Sent: 12/18/2017   4:04 PM To: Lieutenant Diego, RN Subject: chest CT                                       Shawn, Please review this patient's chest CT scan and tell me what and if I need to order a chest CT on this patient.

## 2017-12-25 ENCOUNTER — Encounter (INDEPENDENT_AMBULATORY_CARE_PROVIDER_SITE_OTHER): Payer: Self-pay | Admitting: Vascular Surgery

## 2017-12-25 ENCOUNTER — Ambulatory Visit (INDEPENDENT_AMBULATORY_CARE_PROVIDER_SITE_OTHER): Payer: Medicare Other | Admitting: Vascular Surgery

## 2017-12-25 VITALS — BP 152/80 | HR 75 | Resp 16 | Ht 62.0 in | Wt 277.6 lb

## 2017-12-25 DIAGNOSIS — I10 Essential (primary) hypertension: Secondary | ICD-10-CM

## 2017-12-25 DIAGNOSIS — I890 Lymphedema, not elsewhere classified: Secondary | ICD-10-CM

## 2017-12-25 DIAGNOSIS — R600 Localized edema: Secondary | ICD-10-CM

## 2017-12-25 DIAGNOSIS — Z6841 Body Mass Index (BMI) 40.0 and over, adult: Secondary | ICD-10-CM | POA: Diagnosis not present

## 2017-12-25 DIAGNOSIS — E7800 Pure hypercholesterolemia, unspecified: Secondary | ICD-10-CM

## 2017-12-25 NOTE — Assessment & Plan Note (Signed)
blood pressure control important in reducing the progression of atherosclerotic disease. On appropriate oral medications.  

## 2017-12-25 NOTE — Progress Notes (Signed)
MRN : [id number]  Tracy Espinoza is a 72 y.o. (1946/05/13) female who presents with chief complaint of  Chief Complaint  Patient presents with  . Follow-up    43month follow up  .  History of Present Illness: Patient returns today in follow up of leg swelling.  She has had some improvement with the daily use of compression devices which are a combination of stockings and the Velcro compression.  She still has fairly prominent swelling worse on the left than the right.  The spells over the ankle and has the typical clinical appearance of lymphedema.  No new ulceration or infection.  No fevers or chills.  Current Outpatient Medications  Medication Sig Dispense Refill  . acetaminophen (ACETAMINOPHEN 8 HOUR) 650 MG CR tablet Take 1,300 mg by mouth every 8 (eight) hours as needed for pain.    . Ascorbic Acid (VITAMIN C) 1000 MG tablet Take 1,000 mg by mouth daily.    Marland Kitchen aspirin EC 81 MG tablet Take 81 mg by mouth daily.    . Calcium Carb-Cholecalciferol (CALCIUM 600+D) 600-800 MG-UNIT TABS Take 1 tablet by mouth daily.     . cetirizine (ZYRTEC) 10 MG tablet Take 10 mg by mouth daily.    Marland Kitchen CRANBERRY PO Take 3 capsules by mouth.     . doxycycline (VIBRAMYCIN) 100 MG capsule Take 100 mg by mouth at bedtime.     . hydrochlorothiazide (HYDRODIURIL) 25 MG tablet Take 1 tablet (25 mg total) by mouth daily. 90 tablet 4  . Loperamide HCl (IMODIUM PO) Take 2 capsules by mouth 2 (two) times daily as needed.    Marland Kitchen LORazepam (ATIVAN) 0.5 MG tablet Take 1 tablet (0.5 mg total) by mouth daily. Take only for bad days 30 tablet 5  . losartan (COZAAR) 100 MG tablet TAKE 1 TABLET BY MOUTH  DAILY 90 tablet 2  . meloxicam (MOBIC) 15 MG tablet Take by mouth daily.     . metoprolol succinate (TOPROL-XL) 50 MG 24 hr tablet Take 1 tablet (50 mg total) by mouth daily. 90 tablet 4  . Multiple Vitamin (MULTIVITAMIN) tablet Take 1 tablet by mouth daily.    Marland Kitchen omeprazole (PRILOSEC) 40 MG capsule Take 1 capsule (40 mg total)  by mouth 2 (two) times daily. 180 capsule 4  . ondansetron (ZOFRAN) 8 MG tablet Take by mouth every 8 (eight) hours as needed for nausea or vomiting.    . Probiotic Product ([facility] DAILY PROBIOTIC PO) Take by mouth.    . traMADol (ULTRAM) 50 MG tablet TAKE 1 TABLET BY MOUTH TWICE A DAY AS NEEDED 60 tablet 1  . traZODone (DESYREL) 50 MG tablet Take 1-2 tablets (50-100 mg total) by mouth at bedtime. 60 tablet 3   No current facility-administered medications for this visit.     Past Medical History:  Diagnosis Date  . Anxiety   . Arthritis   . Cancer ([place])    BASAL CELL SKIN CANCERS REMOVED  . GERD (gastroesophageal reflux disease)   . History of hiatal hernia   . Hypertension     Past Surgical History:  Procedure Laterality Date  . ABDOMINAL HYSTERECTOMY    . APPENDECTOMY    . CHOLECYSTECTOMY    . FRACTURE SURGERY     BONE ON SIDE OF RIGHT FOOT  . HERNIA REPAIR     UMBILICAL X 2  . JOINT REPLACEMENT Bilateral    knees  . LUMBAR WOUND DEBRIDEMENT N/A 08/03/2016   Procedure: IRRIGATION AND DEBRIDEMENT  LUMBAR WOUND;  Surgeon: Eustace Moore, MD;  Location: [facility];  Service: Neurosurgery;  Laterality: N/A;  . REPAIR OF LEFT URETER Left 40 YRS AGO  . ROTATOR CUFF REPAIR  2014  . TONSILLECTOMY    . TUBAL LIGATION      Social History Social History   Tobacco Use  . Smoking status: Never Smoker  . Smokeless tobacco: Never Used  Substance Use Topics  . Alcohol use: No  . Drug use: No      Family History Family History  Problem Relation Age of Onset  . Hypertension Mother   . Stroke Mother   . Alzheimer's disease Mother   . Cancer Father        brain  . Hypertension Father   . Stroke Father      Allergies  Allergen Reactions  . Tape Itching, Dermatitis and Rash  . 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 Band-Aid Plus Antibiotic [Bacitracin-Polymyxin B] Other (See Comments)    Unknown  . Morphine Sulfate Itching  .  Vancomycin Rash     REVIEW OF SYSTEMS (Negative unless checked)  Constitutional: [] Weight loss  [] Fever  [] Chills Cardiac: [] Chest pain   [] Chest pressure   [] Palpitations   [] Shortness of breath when laying flat   [] Shortness of breath at rest   [] Shortness of breath with exertion. Vascular:  [] Pain in legs with walking   [] Pain in legs at rest   [] Pain in legs when laying flat   [] Claudication   [] Pain in feet when walking  [] Pain in feet at rest  [] Pain in feet when laying flat   [] History of DVT   [] Phlebitis   [x] Swelling in legs   [x] Varicose veins   [] Non-healing ulcers Pulmonary:   [] Uses home oxygen   [] Productive cough   [] Hemoptysis   [] Wheeze  [] COPD   [] Asthma Neurologic:  [] Dizziness  [] Blackouts   [] Seizures   [] History of stroke   [] History of TIA  [] Aphasia   [] Temporary blindness   [] Dysphagia   [] Weakness or numbness in arms   [] Weakness or numbness in legs Musculoskeletal:  [x] Arthritis   [] Joint swelling   [x] Joint pain   [x] Low back pain Hematologic:  [] Easy bruising  [] Easy bleeding   [] Hypercoagulable state   [] Anemic   Gastrointestinal:  [] Blood in stool   [] Vomiting blood  [x] Gastroesophageal reflux/heartburn   [] Abdominal pain Genitourinary:  [] Chronic kidney disease   [] Difficult urination  [] Frequent urination  [] Burning with urination   [] Hematuria Skin:  [] Rashes   [] Ulcers   [] Wounds Psychological:  [] History of anxiety   []  History of major depression.  Physical Examination  BP (!) 152/80 (BP Location: Right Arm)   Pulse 75   Resp 16   Ht 5\' 2"  (1.575 m)   Wt 277 lb 9.6 oz (125.9 kg)   BMI 50.77 kg/m  Gen:  WD/WN, NAD.  Obese Head: [place]/AT, No temporalis wasting. Ear/Nose/Throat: Hearing grossly intact, nares w/o erythema or drainage Eyes: Conjunctiva clear. Sclera non-icteric Neck: Supple.  Trachea midline Pulmonary:  Good air movement, no use of accessory muscles.  Cardiac: RRR, no JVD Vascular:  Vessel Right Left  Radial Palpable Palpable                           PT  trace palpable  not palpable  DP  1+ palpable  1+ palpable    Musculoskeletal: M/S 5/5 throughout.  No  deformity or atrophy.  1+ right lower extremity edema, 2+ left lower extremity edema. Neurologic: Sensation grossly intact in extremities.  Symmetrical.  Speech is fluent.  Psychiatric: Judgment intact, Mood & affect appropriate for pt's clinical situation. Dermatologic: No rashes or ulcers noted.  No cellulitis or open wounds.       Labs No results found for this or any previous visit (from the past 2160 hour(s)).  Radiology Ct Lumbar Spine Wo Contrast  Result Date: 12/17/2017 CLINICAL DATA:  History of L-spine fusion several months ago. Patient still complains of low back pain. EXAM: CT LUMBAR SPINE WITHOUT CONTRAST TECHNIQUE: Multidetector CT imaging of the lumbar spine was performed without intravenous contrast administration. Multiplanar CT image reconstructions were also generated. COMPARISON:  05/24/2017. FINDINGS: Segmentation: 5 lumbar type vertebrae. Alignment: 3 mm anterolisthesis L5-S1. 2 mm retrolisthesis L1-2, L2-3, and L3-4, compensatory. Slight degenerative scoliosis convex LEFT related asymmetric loss of interspace height at L3-4 on the RIGHT. Vertebrae: No solid arthrodesis across the L4-5 interspace, either anteroposterior. Sclerosis above and below the BILATERAL cage subsidence Paraspinal and other soft tissues: Increased body habitus without features of epidural lipomatosis. No significant undrained fluid collections. Aortic atherosclerosis. Cholecystectomy clips. Disc levels: L1-L2: 2 mm retrolisthesis. Central protrusion. Facet arthropathy. No impingement. L2-L3: 2 mm retrolisthesis. Central protrusion. Facet arthropathy. No impingement. L3-L4: 2 mm retrolisthesis. Central protrusion. Posterior element hypertrophy. Mild stenosis. RIGHT greater than LEFT L4, possibly RIGHT L3 nerve root impingement. L4-L5: Pseudarthrosis. Vacuum phenomenon within  the interspace indicating motion. Pedicle screws intact and appropriately placed, without significant loosening. Heterotopic ossification within the subarticular zone on the LEFT extends into the extraforaminal soft tissues. LEFT L5 and LEFT L4 nerve root impingement are possible. L5-S1: Facet vacuum phenomenon. Focal calcified protrusion in the midline. No impingement. 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 Cage subsidence with no solid posterior or interbody arthrodesis. Heterotopic bone within the subarticular zone at L4-5 on the LEFTwith additional extraforaminal ossification/spurring extending to the LEFT. LEFT L5 and LEFT L4 nerve root impingement are possible. Adjacent segment disease at L3-4, central protrusion with posterior element hypertrophy and 2 mm retrolisthesis. Mild stenosis with RIGHT greater than LEFT L4, possibly RIGHT L3 nerve root impingement. Electronically Signed   By: Staci Righter M.D.   On: 12/17/2017 13:44     Assessment/Plan  Hypertension blood pressure control important in reducing the progression of atherosclerotic disease. On appropriate oral medications.   BMI 50.0-93.8, adult Certainly worsens lower extremity swelling  Bilateral leg edema Although her swelling is improved somewhat with compression stockings and elevation, it is still significant.  I think a lymphedema pump at this point would be an excellent adjuvant therapy.  We will try to obtain that in the near future and she should begin using this twice daily in addition to her compression stockings and elevation.  I will see her back in 4-6 months to assess her response to therapy.  Lymphedema Although her swelling is improved somewhat with compression stockings and elevation, it is still significant.  I think a lymphedema pump at this point would be an excellent adjuvant therapy.  We will try to obtain that in the near future and she should begin using this twice  daily in addition to her compression stockings and elevation.  I will see her back in 4-6 months to assess her response to therapy.    Leotis Pain, MD  12/25/2017 2:46 PM    This note was created with Dragon medical transcription system.  Any  errors from dictation are purely unintentional

## 2017-12-25 NOTE — Assessment & Plan Note (Signed)
Although her swelling is improved somewhat with compression stockings and elevation, it is still significant.  I think a lymphedema pump at this point would be an excellent adjuvant therapy.  We will try to obtain that in the near future and she should begin using this twice daily in addition to her compression stockings and elevation.  I will see her back in 4-6 months to assess her response to therapy.

## 2017-12-25 NOTE — Assessment & Plan Note (Signed)
Certainly worsens lower extremity swelling

## 2017-12-25 NOTE — Patient Instructions (Signed)

## 2017-12-31 ENCOUNTER — Ambulatory Visit
Admission: RE | Admit: 2017-12-31 | Discharge: 2017-12-31 | Disposition: A | Discharge status: Home / Self Care | Payer: Medicare Other | Source: Ambulatory Visit | Attending: Family Medicine | Admitting: Family Medicine

## 2017-12-31 ENCOUNTER — Other Ambulatory Visit: Payer: Self-pay | Admitting: Family Medicine

## 2017-12-31 DIAGNOSIS — R0789 Other chest pain: Secondary | ICD-10-CM | POA: Insufficient documentation

## 2017-12-31 DIAGNOSIS — M545 Low back pain, unspecified: Secondary | ICD-10-CM

## 2017-12-31 DIAGNOSIS — R918 Other nonspecific abnormal finding of lung field: Secondary | ICD-10-CM | POA: Diagnosis not present

## 2017-12-31 DIAGNOSIS — I700 Atherosclerosis of aorta: Secondary | ICD-10-CM | POA: Insufficient documentation

## 2017-12-31 DIAGNOSIS — D739 Disease of spleen, unspecified: Secondary | ICD-10-CM | POA: Insufficient documentation

## 2017-12-31 DIAGNOSIS — D71 Functional disorders of polymorphonuclear neutrophils: Secondary | ICD-10-CM | POA: Diagnosis not present

## 2017-12-31 NOTE — Telephone Encounter (Signed)
RX  Request Ultram - Controlled  LOV 12/18/2017 Pharmacy on file

## 2018-01-07 ENCOUNTER — Telehealth: Payer: Self-pay | Admitting: Family Medicine

## 2018-01-07 NOTE — Telephone Encounter (Signed)
Phone call Discussed with patient CT showing stable will repeat 1 year to assess stability then will be done.

## 2018-01-07 NOTE — Telephone Encounter (Signed)
-----   Message from Amada Kingfisher, [place] sent at 01/07/2018  5:00 PM EDT ----- Patient was transferred to provider for telephone conversation.

## 2018-02-04 ENCOUNTER — Other Ambulatory Visit: Payer: Self-pay

## 2018-02-04 MED ORDER — TRAZODONE HCL 50 MG PO TABS: 50.0000 mg | ORAL_TABLET | Freq: Every day | ORAL | 0 refills | Status: DC

## 2018-02-05 ENCOUNTER — Other Ambulatory Visit: Payer: Self-pay | Admitting: Family Medicine

## 2018-02-05 DIAGNOSIS — Z1231 Encounter for screening mammogram for malignant neoplasm of breast: Secondary | ICD-10-CM

## 2018-02-06 DIAGNOSIS — H2513 Age-related nuclear cataract, bilateral: Secondary | ICD-10-CM | POA: Diagnosis not present

## 2018-02-11 DIAGNOSIS — G479 Sleep disorder, unspecified: Secondary | ICD-10-CM | POA: Diagnosis not present

## 2018-02-11 DIAGNOSIS — G4733 Obstructive sleep apnea (adult) (pediatric): Secondary | ICD-10-CM | POA: Diagnosis not present

## 2018-03-01 ENCOUNTER — Ambulatory Visit: Payer: Self-pay

## 2018-03-08 DIAGNOSIS — Z792 Long term (current) use of antibiotics: Secondary | ICD-10-CM | POA: Diagnosis not present

## 2018-03-08 DIAGNOSIS — A498 Other bacterial infections of unspecified site: Secondary | ICD-10-CM | POA: Diagnosis not present

## 2018-03-08 DIAGNOSIS — A4902 Methicillin resistant Staphylococcus aureus infection, unspecified site: Secondary | ICD-10-CM | POA: Diagnosis not present

## 2018-03-10 ENCOUNTER — Other Ambulatory Visit: Payer: Self-pay | Admitting: Family Medicine

## 2018-03-10 DIAGNOSIS — M545 Low back pain, unspecified: Secondary | ICD-10-CM

## 2018-03-11 NOTE — Telephone Encounter (Signed)
tramadol refill Last Refill:01/07/18 # 60 1 RF Last OV: 10/31/17 PCP: Dr. Jeananne Rama [facility]acy:[facility] [address]

## 2018-03-13 ENCOUNTER — Ambulatory Visit (INDEPENDENT_AMBULATORY_CARE_PROVIDER_SITE_OTHER): Payer: Medicare Other

## 2018-03-13 VITALS — BP 132/64 | HR 79 | Temp 98.4°F | Resp 18 | Ht 61.0 in | Wt 272.2 lb

## 2018-03-13 DIAGNOSIS — Z0000 Encounter for general adult medical examination without abnormal findings: Secondary | ICD-10-CM | POA: Diagnosis not present

## 2018-03-13 NOTE — Patient Instructions (Addendum)
Tracy Espinoza , Thank you for taking time to come for your Medicare Wellness Visit. I appreciate your ongoing commitment to your health goals. Please review the following plan we discussed and let me know if I can assist you in the future.   Screening recommendations/referrals: Colonoscopy: completed 06/16/2014 Mammogram: scheduled 03/25/2018 Bone Density: completed 03/31/2014 Recommended yearly ophthalmology/optometry visit for glaucoma screening and checkup Recommended yearly dental visit for hygiene and checkup  Vaccinations: Influenza vaccine: due 06/2018 Pneumococcal vaccine: completed Tdap vaccine: up to date Shingles vaccine: up to date   Advanced directives: Please bring a copy of your health care power of attorney and living will to the office at your convenience.  Conditions/risks identified: Recommend drinking at least 6-8 glasses of water a day   Next appointment: Follow up on 03/20/2018 at 2:00pm with Dr.Crissman. Follow up in one year for your medicare wellness exam.   Preventive Care 65 Years and Older, Female Preventive care refers to lifestyle choices and visits with your health care provider that can promote health and wellness. What does preventive care include?  A yearly physical exam. This is also called an annual well check.  Dental exams once or twice a year.  Routine eye exams. Ask your health care provider how often you should have your eyes checked.  Personal lifestyle choices, including:  Daily care of your teeth and gums.  Regular physical activity.  Eating a healthy diet.  Avoiding tobacco and drug use.  Limiting alcohol use.  Practicing safe sex.  Taking low-dose aspirin every day.  Taking vitamin and mineral supplements as recommended by your health care provider. What happens during an annual well check? The services and screenings done by your health care provider during your annual well check will depend on your age, overall health,  lifestyle risk factors, and family history of disease. Counseling  Your health care provider may ask you questions about your:  Alcohol use.  Tobacco use.  Drug use.  Emotional well-being.  Home and relationship well-being.  Sexual activity.  Eating habits.  History of falls.  Memory and ability to understand (cognition).  Work and work Statistician.  Reproductive health. Screening  You may have the following tests or measurements:  Height, weight, and BMI.  Blood pressure.  Lipid and cholesterol levels. These may be checked every 5 years, or more frequently if you are over 72 years old.  Skin check.  Lung cancer screening. You may have this screening every year starting at age 29 if you have a 30-pack-year history of smoking and currently smoke or have quit within the past 15 years.  Fecal occult blood test (FOBT) of the stool. You may have this test every year starting at age 66.  Flexible sigmoidoscopy or colonoscopy. You may have a sigmoidoscopy every 5 years or a colonoscopy every 10 years starting at age 58.  Hepatitis C blood test.  Hepatitis B blood test.  Sexually transmitted disease (STD) testing.  Diabetes screening. This is done by checking your blood sugar (glucose) after you have not eaten for a while (fasting). You may have this done every 1-3 years.  Bone density scan. This is done to screen for osteoporosis. You may have this done starting at age 52.  Mammogram. This may be done every 1-2 years. Talk to your health care provider about how often you should have regular mammograms. Talk with your health care provider about your test results, treatment options, and if necessary, the need for more tests. Vaccines  Your health care provider may recommend certain vaccines, such as:  Influenza vaccine. This is recommended every year.  Tetanus, diphtheria, and acellular pertussis (Tdap, Td) vaccine. You may need a Td booster every 10 years.  Zoster  vaccine. You may need this after age 66.  Pneumococcal 13-valent conjugate (PCV13) vaccine. One dose is recommended after age 21.  Pneumococcal polysaccharide (PPSV23) vaccine. One dose is recommended after age 70. Talk to your health care provider about which screenings and vaccines you need and how often you need them. This information is not intended to replace advice given to you by your health care provider. Make sure you discuss any questions you have with your health care provider. Document Released: 10/15/2015 Document Revised: 06/07/2016 Document Reviewed: 07/20/2015 Elsevier Interactive Patient Education  2017 [place] Prevention in the Home Falls can cause injuries. They can happen to people of all ages. There are many things you can do to make your home safe and to help prevent falls. What can I do on the outside of my home?  Regularly fix the edges of walkways and driveways and fix any cracks.  Remove anything that might make you trip as you walk through a door, such as a raised step or threshold.  Trim any bushes or trees on the path to your home.  Use bright outdoor lighting.  Clear any walking paths of anything that might make someone trip, such as rocks or tools.  Regularly check to see if handrails are loose or broken. Make sure that both sides of any steps have handrails.  Any raised decks and porches should have guardrails on the edges.  Have any leaves, snow, or ice cleared regularly.  Use sand or salt on walking paths during winter.  Clean up any spills in your garage right away. This includes oil or grease spills. What can I do in the bathroom?  Use night lights.  Install grab bars by the toilet and in the tub and shower. Do not use towel bars as grab bars.  Use non-skid mats or decals in the tub or shower.  If you need to sit down in the shower, use a plastic, non-slip stool.  Keep the floor dry. Clean up any water that spills on the  floor as soon as it happens.  Remove soap buildup in the tub or shower regularly.  Attach bath mats securely with double-sided non-slip rug tape.  Do not have throw rugs and other things on the floor that can make you trip. What can I do in the bedroom?  Use night lights.  Make sure that you have a light by your bed that is easy to reach.  Do not use any sheets or blankets that are too big for your bed. They should not hang down onto the floor.  Have a firm chair that has side arms. You can use this for support while you get dressed.  Do not have throw rugs and other things on the floor that can make you trip. What can I do in the kitchen?  Clean up any spills right away.  Avoid walking on wet floors.  Keep items that you use a lot in easy-to-reach places.  If you need to reach something above you, use a strong step stool that has a grab bar.  Keep electrical cords out of the way.  Do not use floor polish or wax that makes floors slippery. If you must use wax, use non-skid floor wax.  Do  not have throw rugs and other things on the floor that can make you trip. What can I do with my stairs?  Do not leave any items on the stairs.  Make sure that there are handrails on both sides of the stairs and use them. Fix handrails that are broken or loose. Make sure that handrails are as long as the stairways.  Check any carpeting to make sure that it is firmly attached to the stairs. Fix any carpet that is loose or worn.  Avoid having throw rugs at the top or bottom of the stairs. If you do have throw rugs, attach them to the floor with carpet tape.  Make sure that you have a light switch at the top of the stairs and the bottom of the stairs. If you do not have them, ask someone to add them for you. What else can I do to help prevent falls?  Wear shoes that:  Do not have high heels.  Have rubber bottoms.  Are comfortable and fit you well.  Are closed at the toe. Do not wear  sandals.  If you use a stepladder:  Make sure that it is fully opened. Do not climb a closed stepladder.  Make sure that both sides of the stepladder are locked into place.  Ask someone to hold it for you, if possible.  Clearly mark and make sure that you can see:  Any grab bars or handrails.  First and last steps.  Where the edge of each step is.  Use tools that help you move around (mobility aids) if they are needed. These include:  Canes.  Walkers.  Scooters.  Crutches.  Turn on the lights when you go into a dark area. Replace any light bulbs as soon as they burn out.  Set up your furniture so you have a clear path. Avoid moving your furniture around.  If any of your floors are uneven, fix them.  If there are any pets around you, be aware of where they are.  Review your medicines with your doctor. Some medicines can make you feel dizzy. This can increase your chance of falling. Ask your doctor what other things that you can do to help prevent falls. This information is not intended to replace advice given to you by your health care provider. Make sure you discuss any questions you have with your health care provider. Document Released: 07/15/2009 Document Revised: 02/24/2016 Document Reviewed: 10/23/2014 Elsevier Interactive Patient Education  2017 [employer].

## 2018-03-13 NOTE — Progress Notes (Signed)
Subjective:   Tracy Espinoza is a 72 y.o. female who presents for Medicare Annual (Subsequent) preventive examination.  Review of Systems:   Cardiac Risk Factors include: hypertension;advanced age (>53men, >21 women);obesity (BMI >30kg/m2);dyslipidemia     Objective:     Vitals: BP 132/64 (Patient Position: Sitting)   Pulse 79   Temp 98.4 F (36.9 C) (Temporal)   Resp 18   Ht 5\' 1"  (1.549 m)   Wt 272 lb 3.2 oz (123.5 kg)   SpO2 94%   BMI 51.43 kg/m   Body mass index is 51.43 kg/m.  Advanced Directives 04/25/2017 02/28/2017 08/22/2016 08/19/2016 08/03/2016 08/02/2016 07/10/2016  Does Patient Have a Medical Advance Directive? No Yes - Yes Yes No No  Type of Advance Directive - Living will;Healthcare Power of Attorney Living will [place];Living will - - -  Does patient want to make changes to medical advance directive? - - No - Patient declined - No - Patient declined - -  Copy of [place] in Chart? - No - copy requested - - - - -  Would patient like information on creating a medical advance directive? No - Patient declined - - - - No - patient declined information Yes Higher education careers adviser given    Tobacco Social History   Tobacco Use  Smoking Status Never Smoker  Smokeless Tobacco Never Used     Counseling given: Not Answered   Clinical Intake:  Pre-visit preparation completed: Yes  Pain : 0-10 Pain Score: 4  Pain Type: Chronic pain Pain Location: Generalized Pain Descriptors / Indicators: Aching Pain Onset: More than a month ago Pain Frequency: Constant     Nutritional Status: BMI > 30  Obese Nutritional Risks: None Diabetes: No  How often do you need to have someone help you when you read instructions, pamphlets, or other written materials from your doctor or pharmacy?: 1 - Never What is the last grade level you completed in school?: 1 year college   Interpreter Needed?: No  Information entered by :: Tiffany  Hill,LPN   Past Medical History:  Diagnosis Date  . Anxiety   . Arthritis   . Cancer ([place])    BASAL CELL SKIN CANCERS REMOVED  . GERD (gastroesophageal reflux disease)   . History of hiatal hernia   . Hypertension   . Lymphedema    Past Surgical History:  Procedure Laterality Date  . ABDOMINAL HYSTERECTOMY    . APPENDECTOMY    . CHOLECYSTECTOMY    . FRACTURE SURGERY     BONE ON SIDE OF RIGHT FOOT  . HERNIA REPAIR     UMBILICAL X 2  . JOINT REPLACEMENT Bilateral    knees  . LUMBAR WOUND DEBRIDEMENT N/A 08/03/2016   Procedure: IRRIGATION AND DEBRIDEMENT LUMBAR WOUND;  Surgeon: Eustace Moore, MD;  Location: [facility];  Service: Neurosurgery;  Laterality: N/A;  . REPAIR OF LEFT URETER Left 40 YRS AGO  . ROTATOR CUFF REPAIR  2014  . TONSILLECTOMY    . TUBAL LIGATION     Family History  Problem Relation Age of Onset  . Hypertension Mother   . Stroke Mother   . Alzheimer's disease Mother   . Cancer Father        brain  . Hypertension Father   . Stroke Father    Social History   Socioeconomic History  . Marital status: Divorced    Spouse name: Not on file  . Number of children: Not on  file  . Years of education: Not on file  . Highest education level: Not on file  Occupational History  . Occupation: retired  Scientific laboratory technician  . Financial resource strain: Not hard at all  . Food insecurity:    Worry: Never true    Inability: Never true  . Transportation needs:    Medical: No    Non-medical: No  Tobacco Use  . Smoking status: Never Smoker  . Smokeless tobacco: Never Used  Substance and Sexual Activity  . Alcohol use: No  . Drug use: No  . Sexual activity: Never  Lifestyle  . Physical activity:    Days per week: 0 days    Minutes per session: 0 min  . Stress: Not at all  Relationships  . Social connections:    Talks on phone: More than three times a week    Gets together: More than three times a week    Attends religious service: Never    Active member of club  or organization: No    Attends meetings of clubs or organizations: Never    Relationship status: Divorced  Other Topics Concern  . Not on file  Social History Narrative  . Not on file    Outpatient Encounter Medications as of 03/13/2018  Medication Sig  . aspirin EC 81 MG tablet Take 81 mg by mouth daily.  . Calcium Carb-Cholecalciferol (CALCIUM 600+D) 600-800 MG-UNIT TABS Take 1 tablet by mouth daily.   . cetirizine (ZYRTEC) 10 MG tablet Take 10 mg by mouth daily.  Marland Kitchen CRANBERRY PO Take 3 capsules by mouth.   . diclofenac sodium (VOLTAREN) 1 % GEL Apply topically 4 (four) times daily.  Marland Kitchen doxycycline (VIBRAMYCIN) 100 MG capsule Take 100 mg by mouth at bedtime.   . hydrochlorothiazide (HYDRODIURIL) 25 MG tablet Take 1 tablet (25 mg total) by mouth daily.  . Loperamide HCl (IMODIUM PO) Take 2 capsules by mouth 2 (two) times daily as needed.  Marland Kitchen LORazepam (ATIVAN) 0.5 MG tablet Take 1 tablet (0.5 mg total) by mouth daily. Take only for bad days  . losartan (COZAAR) 100 MG tablet TAKE 1 TABLET BY MOUTH  DAILY  . meloxicam (MOBIC) 15 MG tablet Take by mouth daily.   . metoprolol succinate (TOPROL-XL) 50 MG 24 hr tablet Take 1 tablet (50 mg total) by mouth daily.  . Multiple Vitamin (MULTIVITAMIN) tablet Take 1 tablet by mouth daily.  Marland Kitchen omeprazole (PRILOSEC) 40 MG capsule Take 1 capsule (40 mg total) by mouth 2 (two) times daily.  . Probiotic Product ([employer] DAILY PROBIOTIC PO) Take by mouth.  . traMADol (ULTRAM) 50 MG tablet TAKE 1 TABLET BY MOUTH TWICE A DAY AS NEEDED  . traZODone (DESYREL) 50 MG tablet Take 1-2 tablets (50-100 mg total) by mouth at bedtime.  Marland Kitchen acetaminophen (ACETAMINOPHEN 8 HOUR) 650 MG CR tablet Take 1,300 mg by mouth every 8 (eight) hours as needed for pain.  . Ascorbic Acid (VITAMIN C) 1000 MG tablet Take 1,000 mg by mouth daily.  . ondansetron (ZOFRAN) 8 MG tablet Take by mouth every 8 (eight) hours as needed for nausea or vomiting.   No facility-administered encounter  medications on file as of 03/13/2018.     Activities of Daily Living In your present state of health, do you have any difficulty performing the following activities: 03/13/2018 12/18/2017  Hearing? N N  Vision? Y N  Comment havig cataract surgery on july 1st -  Difficulty concentrating or making decisions? N N  Walking  or climbing stairs? Y Y  Dressing or bathing? N Y  Doing errands, shopping? N N  Preparing Food and eating ? N -  In the past six months, have you accidently leaked urine? Y -  Comment wears depends  -  Do you have problems with loss of bowel control? Y -  Comment wears depends just in case -  Managing your Medications? N -  Managing your Finances? N -  Housekeeping or managing your Housekeeping? N -  Some recent data might be hidden    Patient Care Team: Guadalupe Maple, MD as PCP - General (Family Medicine) Lucilla Lame, MD as Consulting Physician (Gastroenterology) Delana Meyer, Dolores Lory, MD (Vascular Surgery) Thornton Park, MD as Referring Physician (Orthopedic Surgery) Yolonda Kida, MD as Consulting Physician (Cardiology) Kristeen Miss, MD as Consulting Physician (Neurosurgery) Leonel Ramsay, MD (Infectious Diseases)    Assessment:   This is a routine wellness examination for [facility].  Exercise Activities and Dietary recommendations Current Exercise Habits: The patient does not participate in regular exercise at present, Exercise limited by: None identified  Goals    . DIET - INCREASE WATER INTAKE     Recommend drinking at least 6-8 glasses of water a day        Fall Risk Fall Risk  03/13/2018 12/18/2017 09/17/2017 04/25/2017 02/28/2017  Falls in the past year? No No No No No  Risk for fall due to : - - - - Impaired balance/gait   Is the patient's home free of loose throw rugs in walkways, pet beds, electrical cords, etc?   yes      Grab bars in the bathroom? yes      Handrails on the stairs?   yes      Adequate lighting?   yes  Timed Get  Up and Go performed: Completed in 11 seconds with use of assistive devices- walker, steady gait. No intervention needed at this time.   Depression Screen PHQ 2/9 Scores 03/13/2018 09/17/2017 04/25/2017 02/28/2017  PHQ - 2 Score 0 0 0 0  PHQ- 9 Score - - - 5     Cognitive Function     6CIT Screen 03/13/2018 02/28/2017  What Year? 0 points 0 points  What month? 0 points 0 points  What time? 0 points 0 points  Count back from 20 0 points 0 points  Months in reverse 0 points 0 points  Repeat phrase 0 points 2 points  Total Score 0 2    Immunization History  Administered Date(s) Administered  . Influenza, High Dose Seasonal PF 06/02/2017  . Influenza-Unspecified 06/08/2016  . Pneumococcal Conjugate-13 08/04/2014  . Pneumococcal-Unspecified 11/13/2007, 08/25/2013  . Td 08/04/2014  . Zoster 11/13/2007  . Zoster Recombinat (Shingrix) 11/02/2017    Qualifies for Shingles Vaccine?yes, discussed shingrix vaccine  Screening Tests Health Maintenance  Topic Date Due  . INFLUENZA VACCINE  05/02/2018  . MAMMOGRAM  03/17/2019  . COLONOSCOPY  06/16/2024  . TETANUS/TDAP  08/04/2024  . DEXA SCAN  Completed  . Hepatitis C Screening  Completed  . PNA vac Low Risk Adult  Completed    Cancer Screenings:  Lung: Low Dose CT Chest recommended if Age 21-80 years, 30 pack-year currently smoking OR have quit w/in 15years. Patient does not qualify. Breast:  Up to date on Mammogram? Yes  Scheduled for 03/25/2018 Up to date of Bone Density/Dexa? Yes 03/31/2014 Colorectal: completed 06/16/2014  Additional Screenings:  Hepatitis C Screening: completed 03/13/2017     Plan:  I have personally reviewed and addressed the Medicare Annual Wellness questionnaire and have noted the following in the patient's chart:  A. Medical and social history B. Use of alcohol, tobacco or illicit drugs  C. Current medications and supplements D. Functional ability and status E.  Nutritional status F.  Physical  activity G. Advance directives H. List of other physicians I.  Hospitalizations, surgeries, and ER visits in previous 12 months J.  [place] such as hearing and vision if needed, cognitive and depression L. Referrals and appointments   In addition, I have reviewed and discussed with patient certain preventive protocols, quality metrics, and best practice recommendations. A written personalized care plan for preventive services as well as general preventive health recommendations were provided to patient.   Signed,  Tyler Aas, LPN Nurse Health Advisor   Nurse Notes: none

## 2018-03-18 ENCOUNTER — Encounter: Payer: Self-pay | Admitting: Physician Assistant

## 2018-03-18 ENCOUNTER — Ambulatory Visit (INDEPENDENT_AMBULATORY_CARE_PROVIDER_SITE_OTHER): Payer: Medicare Other | Admitting: Physician Assistant

## 2018-03-18 VITALS — BP 117/66 | HR 66 | Temp 98.8°F

## 2018-03-18 DIAGNOSIS — R1030 Lower abdominal pain, unspecified: Secondary | ICD-10-CM | POA: Diagnosis not present

## 2018-03-18 DIAGNOSIS — N3090 Cystitis, unspecified without hematuria: Secondary | ICD-10-CM | POA: Diagnosis not present

## 2018-03-18 LAB — UA/M W/RFLX CULTURE, ROUTINE
Bilirubin, UA: NEGATIVE
Glucose, UA: NEGATIVE
Ketones, UA: NEGATIVE
Nitrite, UA: POSITIVE — AB
Specific Gravity, UA: 1.015 (ref 1.005–1.030)
Urobilinogen, Ur: 0.2 mg/dL (ref 0.2–1.0)
pH, UA: 6 (ref 5.0–7.5)

## 2018-03-18 LAB — MICROSCOPIC EXAMINATION: WBC, UA: >30 /hpf — AB (ref 0–5)

## 2018-03-18 MED ORDER — SULFAMETHOXAZOLE-TRIMETHOPRIM 800-160 MG PO TABS: 1.0000 | ORAL_TABLET | Freq: Two times a day (BID) | ORAL | 0 refills | Status: DC

## 2018-03-18 NOTE — Patient Instructions (Signed)

## 2018-03-19 NOTE — Progress Notes (Signed)
   Subjective:    Patient ID: Tracy Espinoza, female    DOB: [birth year], 72 y.o.   MRN: [id number]  Tracy Espinoza is a 72 y.o. female presenting on 03/18/2018 for Urinary Tract Infection (pt states that she has been having lower abdominal pain and dark urine since yesterday. States she took the azo home test and it came back with a positive result. )   HPI   History of MRSA infection, on daily doxycycline under direction of Dr. Ola Spurr. Has had urinary frequency, burning. Denies fevers, chills, flank pain.   Social History   Tobacco Use  . Smoking status: Never Smoker  . Smokeless tobacco: Never Used  Substance Use Topics  . Alcohol use: No  . Drug use: No    Review of Systems Per HPI unless specifically indicated above     Objective:    BP 117/66   Pulse 66   Temp 98.8 F (37.1 C) (Oral)   SpO2 94%   Wt Readings from Last 3 Encounters:  03/13/18 272 lb 3.2 oz (123.5 kg)  12/25/17 277 lb 9.6 oz (125.9 kg)  12/18/17 270 lb (122.5 kg)    Physical Exam  Constitutional: She is oriented to person, place, and time. She appears well-developed and well-nourished. No distress.  Cardiovascular: Normal rate and regular rhythm.  Pulmonary/Chest: Effort normal and breath sounds normal.  Abdominal: Soft. Bowel sounds are normal. She exhibits no distension. There is tenderness in the suprapubic area. There is no rebound, no guarding and no CVA tenderness.  Neurological: She is alert and oriented to person, place, and time.  Skin: Skin is warm and dry. She is not diaphoretic.  Psychiatric: She has a normal mood and affect. Her behavior is normal.   Results for orders placed or performed in visit on 03/18/18  Microscopic Examination  Result Value Ref Range   WBC, UA >30 (A) 0 - 5 /hpf   RBC, UA 11-30 (A) 0 - 2 /hpf   Epithelial Cells (non renal) 0-10 0 - 10 /hpf   Bacteria, UA Moderate (A) None seen/Few  UA/M w/rflx Culture, Routine  Result Value Ref Range   Specific Gravity, UA  1.015 1.005 - 1.030   pH, UA 6.0 5.0 - 7.5   Color, UA Yellow Yellow   Appearance Ur Turbid (A) Clear   Leukocytes, UA 1+ (A) Negative   Protein, UA 2+ (A) Negative/Trace   Glucose, UA Negative Negative   Ketones, UA Negative Negative   RBC, UA 3+ (A) Negative   Bilirubin, UA Negative Negative   Urobilinogen, Ur 0.2 0.2 - 1.0 mg/dL   Nitrite, UA Positive (A) Negative   Microscopic Examination See below:       Assessment & Plan:   1. Lower abdominal pain  Treat for cystitis with Bactrim based on prior cx sensitivity. Adjust pending culture.  - UA/M w/rflx Culture, Routine  2. Cystitis  - sulfamethoxazole-trimethoprim (BACTRIM DS) 800-160 MG tablet; Take 1 tablet by mouth 2 (two) times daily for 5 days.  Dispense: 10 tablet; Refill: 0 - Urine Culture    Follow up plan: Return if symptoms worsen or fail to improve.  Carles Collet, PA-C [facility] 03/19/2018, 11:43 AM

## 2018-03-20 ENCOUNTER — Ambulatory Visit (INDEPENDENT_AMBULATORY_CARE_PROVIDER_SITE_OTHER): Payer: Medicare Other | Admitting: Family Medicine

## 2018-03-20 ENCOUNTER — Encounter: Payer: Self-pay | Admitting: Family Medicine

## 2018-03-20 VITALS — BP 111/71 | HR 64 | Ht 61.0 in | Wt 272.0 lb

## 2018-03-20 DIAGNOSIS — Z131 Encounter for screening for diabetes mellitus: Secondary | ICD-10-CM | POA: Diagnosis not present

## 2018-03-20 DIAGNOSIS — I10 Essential (primary) hypertension: Secondary | ICD-10-CM

## 2018-03-20 DIAGNOSIS — M545 Low back pain, unspecified: Secondary | ICD-10-CM

## 2018-03-20 DIAGNOSIS — K219 Gastro-esophageal reflux disease without esophagitis: Secondary | ICD-10-CM | POA: Diagnosis not present

## 2018-03-20 DIAGNOSIS — Z7189 Other specified counseling: Secondary | ICD-10-CM | POA: Diagnosis not present

## 2018-03-20 DIAGNOSIS — I890 Lymphedema, not elsewhere classified: Secondary | ICD-10-CM | POA: Diagnosis not present

## 2018-03-20 DIAGNOSIS — Z1329 Encounter for screening for other suspected endocrine disorder: Secondary | ICD-10-CM

## 2018-03-20 DIAGNOSIS — E7800 Pure hypercholesterolemia, unspecified: Secondary | ICD-10-CM

## 2018-03-20 DIAGNOSIS — F419 Anxiety disorder, unspecified: Secondary | ICD-10-CM

## 2018-03-20 DIAGNOSIS — F5101 Primary insomnia: Secondary | ICD-10-CM

## 2018-03-20 LAB — URINALYSIS, ROUTINE W REFLEX MICROSCOPIC
BILIRUBIN UA: NEGATIVE
Glucose, UA: NEGATIVE
KETONES UA: NEGATIVE
NITRITE UA: NEGATIVE
Protein, UA: NEGATIVE
Specific Gravity, UA: 1.02 (ref 1.005–1.030)
UUROB: 0.2 mg/dL (ref 0.2–1.0)
pH, UA: 6 (ref 5.0–7.5)

## 2018-03-20 LAB — MICROSCOPIC EXAMINATION

## 2018-03-20 MED ORDER — TRAZODONE HCL 50 MG PO TABS: 50.0000 mg | ORAL_TABLET | Freq: Every day | ORAL | 4 refills | Status: DC

## 2018-03-20 MED ORDER — HYDROCHLOROTHIAZIDE 25 MG PO TABS: 25.0000 mg | ORAL_TABLET | Freq: Every day | ORAL | 4 refills | Status: DC

## 2018-03-20 MED ORDER — OMEPRAZOLE 40 MG PO CPDR: 40.0000 mg | DELAYED_RELEASE_CAPSULE | Freq: Two times a day (BID) | ORAL | 4 refills | Status: DC

## 2018-03-20 MED ORDER — LOSARTAN POTASSIUM 100 MG PO TABS: 100.0000 mg | ORAL_TABLET | Freq: Every day | ORAL | 4 refills | Status: DC

## 2018-03-20 MED ORDER — TRAMADOL HCL 50 MG PO TABS
50.0000 mg | ORAL_TABLET | Freq: Two times a day (BID) | ORAL | 1 refills | Status: DC | PRN
Start: 2018-03-20 — End: 2018-10-24

## 2018-03-20 MED ORDER — LORAZEPAM 0.5 MG PO TABS: 0.5000 mg | ORAL_TABLET | Freq: Every day | ORAL | 1 refills | Status: DC

## 2018-03-20 MED ORDER — METOPROLOL SUCCINATE ER 50 MG PO TB24: 50.0000 mg | ORAL_TABLET | Freq: Every day | ORAL | 4 refills | Status: DC

## 2018-03-20 NOTE — Progress Notes (Signed)
BP 111/71   Pulse 64   Ht 5\' 1"  (1.549 m)   Wt 272 lb (123.4 kg)   SpO2 97%   BMI 51.39 kg/m    Subjective:    Patient ID: Tracy Espinoza, female    DOB: [birth year], 72 y.o.   MRN: [id number]  HPI: Tracy Espinoza is a 72 y.o. female  Chief Complaint  Patient presents with  . Annual Exam   For the last 3 weeks or so has had some neck ache with crepitus.  Seems to radiate into her upper shoulder area and has decreased range of motion. Has used heat and other modalities without much relief.  Patient with continued disordered sleep has been to sleep clinic and tried may be adjusting medications but is uncomfortable with increased dosing will leave alone patient sleeps may be a little better and will leave alone for now. Patient's tramadol usage is 50 mg twice a day takes for various arthralgias in her hands back feet and legs.  This seems to be adequate for control mixes and Tylenol which helps somewhat. Patient takes lorazepam 0.5 generally at night to help with restless legs type symptoms in her feet helps calm things down.  Will need to have an extra on standby if has some anxiety during the day will increase prescription from 30tablets to 45 tablets. Blood pressure reflux also doing well with no complaints. Wears compression garments on her feet and legs which seem to help some.   Relevant past medical, surgical, family and social history reviewed and updated as indicated. Interim medical history since our last visit reviewed. Allergies and medications reviewed and updated.  Review of Systems  Constitutional: Negative.   HENT: Negative.   Eyes: Negative.   Respiratory: Negative.   Cardiovascular: Negative.   Gastrointestinal: Negative.   Endocrine: Negative.   Genitourinary: Negative.   Musculoskeletal: Negative.   Skin: Negative.   Allergic/Immunologic: Negative.   Neurological: Negative.   Hematological: Negative.   Psychiatric/Behavioral: Negative.     Per HPI  unless specifically indicated above     Objective:    BP 111/71   Pulse 64   Ht 5\' 1"  (1.549 m)   Wt 272 lb (123.4 kg)   SpO2 97%   BMI 51.39 kg/m   Wt Readings from Last 3 Encounters:  03/20/18 272 lb (123.4 kg)  03/13/18 272 lb 3.2 oz (123.5 kg)  12/25/17 277 lb 9.6 oz (125.9 kg)    Physical Exam  Constitutional: She is oriented to person, place, and time. She appears well-developed and well-nourished.  HENT:  Head: Normocephalic and atraumatic.  Right Ear: External ear normal.  Left Ear: External ear normal.  Nose: Nose normal.  Mouth/Throat: Oropharynx is clear and moist.  Eyes: Pupils are equal, round, and reactive to light. Conjunctivae and EOM are normal.  Neck: Normal range of motion. Neck supple. Carotid bruit is not present.  Cardiovascular: Normal rate, regular rhythm and normal heart sounds.  No murmur heard. Pulmonary/Chest: Effort normal and breath sounds normal. She exhibits no mass. Right breast exhibits no mass, no skin change and no tenderness. Left breast exhibits no mass, no skin change and no tenderness. Breasts are symmetrical.  Abdominal: Soft. Bowel sounds are normal. There is no hepatosplenomegaly.  Musculoskeletal: Normal range of motion.  Neurological: She is alert and oriented to person, place, and time.  Skin: No rash noted.  Psychiatric: She has a normal mood and affect. Her behavior is normal. Judgment and thought content  normal.    Results for orders placed or performed in visit on 03/18/18  Urine Culture  Result Value Ref Range   Urine Culture, Routine Preliminary report    Organism ID, Bacteria Comment   Microscopic Examination  Result Value Ref Range   WBC, UA >30 (A) 0 - 5 /hpf   RBC, UA 11-30 (A) 0 - 2 /hpf   Epithelial Cells (non renal) 0-10 0 - 10 /hpf   Bacteria, UA Moderate (A) None seen/Few  UA/M w/rflx Culture, Routine  Result Value Ref Range   Specific Gravity, UA 1.015 1.005 - 1.030   pH, UA 6.0 5.0 - 7.5   Color, UA  Yellow Yellow   Appearance Ur Turbid (A) Clear   Leukocytes, UA 1+ (A) Negative   Protein, UA 2+ (A) Negative/Trace   Glucose, UA Negative Negative   Ketones, UA Negative Negative   RBC, UA 3+ (A) Negative   Bilirubin, UA Negative Negative   Urobilinogen, Ur 0.2 0.2 - 1.0 mg/dL   Nitrite, UA Positive (A) Negative   Microscopic Examination See below:       Assessment & Plan:   Problem List Items Addressed This Visit      Cardiovascular and Mediastinum   Hypertension - Primary    The current medical regimen is effective;  continue present plan and medications.       Relevant Medications   metoprolol succinate (TOPROL-XL) 50 MG 24 hr tablet   losartan (COZAAR) 100 MG tablet   hydrochlorothiazide (HYDRODIURIL) 25 MG tablet   Other Relevant Orders   CBC with Differential/Platelet   Comprehensive metabolic panel   Lipid panel   Urinalysis, Routine w reflex microscopic     Digestive   GERD (gastroesophageal reflux disease)    The current medical regimen is effective;  continue present plan and medications.       Relevant Medications   omeprazole (PRILOSEC) 40 MG capsule     Other   Hypercholesteremia    The current medical regimen is effective;  continue present plan and medications.       Relevant Medications   metoprolol succinate (TOPROL-XL) 50 MG 24 hr tablet   losartan (COZAAR) 100 MG tablet   hydrochlorothiazide (HYDRODIURIL) 25 MG tablet   Other Relevant Orders   CBC with Differential/Platelet   Comprehensive metabolic panel   Lipid panel   Urinalysis, Routine w reflex microscopic   Anxiety    Discussed anxiety care and treatment will give 45 pills for 30 days.  Patient should have some leftover.      Relevant Medications   traZODone (DESYREL) 50 MG tablet   LORazepam (ATIVAN) 0.5 MG tablet   Back pain at L4-L5 level   Relevant Medications   traMADol (ULTRAM) 50 MG tablet   Advanced care planning/counseling discussion    A voluntary discussion about  advanced care planning including explanation and discussion of advanced directives was extentively discussed with the patient.  Explained about the healthcare proxy and living will was reviewed and packet with forms with expiration of how to fill them out was given.  Time spent: Encounter 16+ min individuals present: Patient      Insomnia    Review and consults patient will pretty much leave alone.  And continue current medication.      Lymphedema    The current medical regimen is effective;  continue present plan and medications.        Other Visit Diagnoses    Screening for diabetes mellitus (DM)  Relevant Orders   CBC with Differential/Platelet   Comprehensive metabolic panel   Lipid panel   Urinalysis, Routine w reflex microscopic   Thyroid disorder screen       Relevant Orders   TSH       Follow up plan: Return in about 6 months (around 09/19/2018) for BMP,  Lipids, ALT, AST.

## 2018-03-20 NOTE — Assessment & Plan Note (Signed)
The current medical regimen is effective;  continue present plan and medications.  

## 2018-03-20 NOTE — Assessment & Plan Note (Signed)
A voluntary discussion about advanced care planning including explanation and discussion of advanced directives was extentively discussed with the patient.  Explained about the healthcare proxy and living will was reviewed and packet with forms with expiration of how to fill them out was given.  Time spent: Encounter 16+ min individuals present: Patient 

## 2018-03-20 NOTE — Assessment & Plan Note (Signed)
Discussed anxiety care and treatment will give 45 pills for 30 days.  Patient should have some leftover.

## 2018-03-20 NOTE — Assessment & Plan Note (Signed)
Review and consults patient will pretty much leave alone.  And continue current medication.

## 2018-03-21 DIAGNOSIS — H2512 Age-related nuclear cataract, left eye: Secondary | ICD-10-CM | POA: Diagnosis not present

## 2018-03-21 LAB — COMPREHENSIVE METABOLIC PANEL WITH GFR
ALT: 15 [IU]/L (ref 0–32)
AST: 18 [IU]/L (ref 0–40)
Albumin/Globulin Ratio: 1.8 (ref 1.2–2.2)
Albumin: 4.1 g/dL (ref 3.5–4.8)
Alkaline Phosphatase: 47 [IU]/L (ref 39–117)
BUN/Creatinine Ratio: 26 (ref 12–28)
BUN: 25 mg/dL (ref 8–27)
Bilirubin Total: 0.3 mg/dL (ref 0.0–1.2)
CO2: 27 mmol/L (ref 20–29)
Calcium: 9.3 mg/dL (ref 8.7–10.3)
Chloride: 100 mmol/L (ref 96–106)
Creatinine, Ser: 0.98 mg/dL (ref 0.57–1.00)
GFR calc Af Amer: 66 mL/min/{1.73_m2}
GFR calc non Af Amer: 57 mL/min/{1.73_m2} — ABNORMAL LOW
Globulin, Total: 2.3 g/dL (ref 1.5–4.5)
Glucose: 101 mg/dL — ABNORMAL HIGH (ref 65–99)
Potassium: 4.5 mmol/L (ref 3.5–5.2)
Sodium: 142 mmol/L (ref 134–144)
Total Protein: 6.4 g/dL (ref 6.0–8.5)

## 2018-03-21 LAB — CBC WITH DIFFERENTIAL/PLATELET
BASOS ABS: 0.1 10*3/uL (ref 0.0–0.2)
BASOS: 1 %
EOS (ABSOLUTE): 0.3 10*3/uL (ref 0.0–0.4)
EOS: 3 %
HEMATOCRIT: 40.2 % (ref 34.0–46.6)
HEMOGLOBIN: 12.8 g/dL (ref 11.1–15.9)
IMMATURE GRANS (ABS): 0 10*3/uL (ref 0.0–0.1)
Immature Granulocytes: 0 %
LYMPHS: 42 %
Lymphocytes Absolute: 3.6 10*3/uL — ABNORMAL HIGH (ref 0.7–3.1)
MCH: 28.4 pg (ref 26.6–33.0)
MCHC: 31.8 g/dL (ref 31.5–35.7)
MCV: 89 fL (ref 79–97)
MONOCYTES: 7 %
Monocytes Absolute: 0.6 10*3/uL (ref 0.1–0.9)
NEUTROS ABS: 3.9 10*3/uL (ref 1.4–7.0)
Neutrophils: 47 %
Platelets: 189 10*3/uL (ref 150–450)
RBC: 4.5 x10E6/uL (ref 3.77–5.28)
RDW: 13.2 % (ref 12.3–15.4)
WBC: 8.6 10*3/uL (ref 3.4–10.8)

## 2018-03-21 LAB — LIPID PANEL
Chol/HDL Ratio: 3.6 ratio (ref 0.0–4.4)
Cholesterol, Total: 159 mg/dL (ref 100–199)
HDL: 44 mg/dL (ref >39)
LDL Calculated: 69 mg/dL (ref 0–99)
Triglycerides: 232 mg/dL — ABNORMAL HIGH (ref 0–149)
VLDL CHOLESTEROL CAL: 46 mg/dL — AB (ref 5–40)

## 2018-03-21 LAB — URINE CULTURE

## 2018-03-21 LAB — TSH: TSH: 2.31 u[IU]/mL (ref 0.450–4.500)

## 2018-03-21 MED ORDER — CIPROFLOXACIN HCL 250 MG PO TABS: 250.0000 mg | ORAL_TABLET | Freq: Two times a day (BID) | ORAL | 0 refills | Status: DC

## 2018-03-21 NOTE — Addendum Note (Signed)
Addended by: Trinna Post on: 03/21/2018 04:56 PM   Modules accepted: Orders

## 2018-03-25 ENCOUNTER — Ambulatory Visit: Payer: Medicare Other

## 2018-03-25 ENCOUNTER — Ambulatory Visit
Admission: RE | Admit: 2018-03-25 | Discharge: 2018-03-25 | Disposition: A | Discharge status: Home / Self Care | Payer: Medicare Other | Source: Ambulatory Visit | Attending: Family Medicine | Admitting: Family Medicine

## 2018-03-25 DIAGNOSIS — Z1231 Encounter for screening mammogram for malignant neoplasm of breast: Secondary | ICD-10-CM

## 2018-03-26 ENCOUNTER — Encounter: Payer: Self-pay | Admitting: Family Medicine

## 2018-03-26 ENCOUNTER — Encounter: Payer: Self-pay | Admitting: *Deleted

## 2018-03-28 ENCOUNTER — Encounter
Admission: RE | Disposition: A | Discharge status: Home / Self Care | Payer: Self-pay | Source: Ambulatory Visit | Attending: Ophthalmology

## 2018-03-28 ENCOUNTER — Encounter: Payer: Self-pay | Admitting: Emergency Medicine

## 2018-03-28 ENCOUNTER — Ambulatory Visit
Admission: RE | Admit: 2018-03-28 | Discharge: 2018-03-28 | Disposition: A | Discharge status: Home / Self Care | Payer: Medicare Other | Source: Ambulatory Visit | Attending: Ophthalmology | Admitting: Ophthalmology

## 2018-03-28 ENCOUNTER — Ambulatory Visit: Payer: Medicare Other | Admitting: Certified Registered"

## 2018-03-28 DIAGNOSIS — Z79899 Other long term (current) drug therapy: Secondary | ICD-10-CM | POA: Insufficient documentation

## 2018-03-28 DIAGNOSIS — I10 Essential (primary) hypertension: Secondary | ICD-10-CM | POA: Diagnosis not present

## 2018-03-28 DIAGNOSIS — Z7982 Long term (current) use of aspirin: Secondary | ICD-10-CM | POA: Insufficient documentation

## 2018-03-28 DIAGNOSIS — F329 Major depressive disorder, single episode, unspecified: Secondary | ICD-10-CM | POA: Insufficient documentation

## 2018-03-28 DIAGNOSIS — F419 Anxiety disorder, unspecified: Secondary | ICD-10-CM | POA: Diagnosis not present

## 2018-03-28 DIAGNOSIS — M1990 Unspecified osteoarthritis, unspecified site: Secondary | ICD-10-CM | POA: Insufficient documentation

## 2018-03-28 DIAGNOSIS — H2512 Age-related nuclear cataract, left eye: Secondary | ICD-10-CM | POA: Diagnosis not present

## 2018-03-28 DIAGNOSIS — Z885 Allergy status to narcotic agent status: Secondary | ICD-10-CM | POA: Insufficient documentation

## 2018-03-28 DIAGNOSIS — E7800 Pure hypercholesterolemia, unspecified: Secondary | ICD-10-CM | POA: Diagnosis not present

## 2018-03-28 DIAGNOSIS — F418 Other specified anxiety disorders: Secondary | ICD-10-CM | POA: Diagnosis not present

## 2018-03-28 DIAGNOSIS — Z888 Allergy status to other drugs, medicaments and biological substances status: Secondary | ICD-10-CM | POA: Diagnosis not present

## 2018-03-28 DIAGNOSIS — Z85828 Personal history of other malignant neoplasm of skin: Secondary | ICD-10-CM | POA: Diagnosis not present

## 2018-03-28 DIAGNOSIS — R011 Cardiac murmur, unspecified: Secondary | ICD-10-CM | POA: Insufficient documentation

## 2018-03-28 DIAGNOSIS — K219 Gastro-esophageal reflux disease without esophagitis: Secondary | ICD-10-CM | POA: Insufficient documentation

## 2018-03-28 DIAGNOSIS — Z6841 Body Mass Index (BMI) 40.0 and over, adult: Secondary | ICD-10-CM | POA: Insufficient documentation

## 2018-03-28 HISTORY — DX: Cardiac murmur, unspecified: R01.1

## 2018-03-28 HISTORY — PX: CATARACT EXTRACTION W/PHACO: SHX586

## 2018-03-28 HISTORY — DX: Major depressive disorder, single episode, unspecified: F32.9

## 2018-03-28 HISTORY — DX: Depression, unspecified: F32.A

## 2018-03-28 SURGERY — PHACOEMULSIFICATION, CATARACT, WITH IOL INSERTION
Anesthesia: Monitor Anesthesia Care | Site: Eye | Laterality: Left | Wound class: Clean

## 2018-03-28 MED ORDER — EPINEPHRINE PF 1 MG/ML IJ SOLN
INTRAOCULAR | Status: DC | PRN
  Administered 2018-03-28: 11:00:00 via OPHTHALMIC

## 2018-03-28 MED ORDER — POVIDONE-IODINE 5 % OP SOLN
OPHTHALMIC | Status: AC
  Filled 2018-03-28: qty 30

## 2018-03-28 MED ORDER — POVIDONE-IODINE 5 % OP SOLN
OPHTHALMIC | Status: DC | PRN
  Administered 2018-03-28: 1 via OPHTHALMIC

## 2018-03-28 MED ORDER — MIDAZOLAM HCL 2 MG/2ML IJ SOLN
INTRAMUSCULAR | Status: AC
  Filled 2018-03-28: qty 2

## 2018-03-28 MED ORDER — SODIUM HYALURONATE 10 MG/ML IO SOLN
INTRAOCULAR | Status: DC | PRN
  Administered 2018-03-28: 0.55 mL via INTRAOCULAR

## 2018-03-28 MED ORDER — FENTANYL CITRATE (PF) 100 MCG/2ML IJ SOLN: 25.0000 ug | INTRAMUSCULAR | Status: DC | PRN

## 2018-03-28 MED ORDER — EPINEPHRINE PF 1 MG/ML IJ SOLN
INTRAMUSCULAR | Status: AC
  Filled 2018-03-28: qty 2

## 2018-03-28 MED ORDER — MOXIFLOXACIN HCL 0.5 % OP SOLN
OPHTHALMIC | Status: DC | PRN
  Administered 2018-03-28: 0.2 mL via OPHTHALMIC

## 2018-03-28 MED ORDER — MIDAZOLAM HCL 2 MG/2ML IJ SOLN
INTRAMUSCULAR | Status: DC | PRN
  Administered 2018-03-28 (×2): 1 mg via INTRAVENOUS

## 2018-03-28 MED ORDER — ONDANSETRON HCL 4 MG/2ML IJ SOLN: 4.0000 mg | Freq: Once | INTRAMUSCULAR | Status: DC | PRN

## 2018-03-28 MED ORDER — ARMC OPHTHALMIC DILATING DROPS
1.0000 "application " | OPHTHALMIC | Status: AC
  Administered 2018-03-28 (×3): 1 via OPHTHALMIC

## 2018-03-28 MED ORDER — ARMC OPHTHALMIC DILATING DROPS
OPHTHALMIC | Status: AC
  Administered 2018-03-28: 1 via OPHTHALMIC
  Filled 2018-03-28: qty 0.4

## 2018-03-28 MED ORDER — SODIUM CHLORIDE 0.9 % IV SOLN
INTRAVENOUS | Status: DC
  Administered 2018-03-28: 10:00:00 via INTRAVENOUS

## 2018-03-28 MED ORDER — MOXIFLOXACIN HCL 0.5 % OP SOLN: 1.0000 [drp] | OPHTHALMIC | Status: DC | PRN

## 2018-03-28 MED ORDER — SODIUM HYALURONATE 23 MG/ML IO SOLN
INTRAOCULAR | Status: DC | PRN
  Administered 2018-03-28: 0.6 mL via INTRAOCULAR

## 2018-03-28 MED ORDER — MOXIFLOXACIN HCL 0.5 % OP SOLN
OPHTHALMIC | Status: AC
  Filled 2018-03-28: qty 3

## 2018-03-28 MED ORDER — LIDOCAINE HCL (PF) 4 % IJ SOLN
INTRAOCULAR | Status: DC | PRN
  Administered 2018-03-28: 4 mL via OPHTHALMIC

## 2018-03-28 MED ORDER — LIDOCAINE HCL (PF) 4 % IJ SOLN
INTRAMUSCULAR | Status: AC
  Filled 2018-03-28: qty 5

## 2018-03-28 SURGICAL SUPPLY — 16 items
DISSECTOR HYDRO NUCLEUS 50X22 (MISCELLANEOUS) ×3 IMPLANT
GLOVE BIO SURGEON STRL SZ8 (GLOVE) ×3 IMPLANT
GLOVE BIOGEL M 6.5 STRL (GLOVE) ×3 IMPLANT
GLOVE SURG LX 7.5 STRW (GLOVE) ×2
GLOVE SURG LX STRL 7.5 STRW (GLOVE) ×1 IMPLANT
GOWN STRL REUS W/ TWL LRG LVL3 (GOWN DISPOSABLE) ×2 IMPLANT
GOWN STRL REUS W/TWL LRG LVL3 (GOWN DISPOSABLE) ×6
LABEL CATARACT MEDS ST (LABEL) ×3 IMPLANT
LENS IOL TECNIS ITEC 16.5 (Intraocular Lens) ×2 IMPLANT
PACK CATARACT (MISCELLANEOUS) ×3 IMPLANT
PACK CATARACT KING (MISCELLANEOUS) ×3 IMPLANT
PACK EYE AFTER SURG (MISCELLANEOUS) ×3 IMPLANT
SOL BSS BAG (MISCELLANEOUS) ×3
SOLUTION BSS BAG (MISCELLANEOUS) ×1 IMPLANT
WATER STERILE IRR 250ML POUR (IV SOLUTION) ×3 IMPLANT
WIPE NON LINTING 3.25X3.25 (MISCELLANEOUS) ×3 IMPLANT

## 2018-03-28 NOTE — Anesthesia Postprocedure Evaluation (Signed)
Anesthesia Post Note  Patient: Tracy Espinoza  Procedure(s) Performed: CATARACT EXTRACTION PHACO AND INTRAOCULAR LENS PLACEMENT ([place]) (Left Eye)  Patient location during evaluation: PACU Anesthesia Type: MAC Level of consciousness: awake and alert and oriented Pain management: pain level controlled Vital Signs Assessment: post-procedure vital signs reviewed and stable Respiratory status: spontaneous breathing Cardiovascular status: blood pressure returned to baseline Anesthetic complications: no     Last Vitals:  Vitals:   03/28/18 1118 03/28/18 1122  BP: 125/68 109/70  Pulse: 70 73  Resp: 16 16  Temp: (!) 36 C   SpO2: 98%     Last Pain:  Vitals:   03/28/18 1118  TempSrc:   PainSc: 0-No pain                 Rowdy Guerrini

## 2018-03-28 NOTE — H&P (Signed)
The History and Physical notes are on paper, have been signed, and are to be scanned.   I have examined the patient and there are no changes to the H&P.   Benay Pillow 03/28/2018 10:46 AM

## 2018-03-28 NOTE — OR Nursing (Signed)
IV removed without difficulty. Site clear.

## 2018-03-28 NOTE — Op Note (Signed)
OPERATIVE NOTE  Tracy Espinoza [id number] 03/28/2018   PREOPERATIVE DIAGNOSIS:  Nuclear sclerotic cataract left eye.  H25.12   POSTOPERATIVE DIAGNOSIS:    Nuclear sclerotic cataract left eye.     PROCEDURE:  Phacoemusification with posterior chamber intraocular lens placement of the left eye   LENS:   Implant Name Type Inv. Item Serial No. Manufacturer Lot No. LRB No. Used  LENS IOL DIOP 16.5 - [id number] Intraocular Lens LENS IOL DIOP 16.5 [id number]  Left 1       PCB00 +16.5   ULTRASOUND TIME: 0 minutes 28 seconds.  CDE 2.21   SURGEON:  Benay Pillow, MD, MPH   ANESTHESIA:  Topical with tetracaine drops augmented with 1% preservative-free intracameral lidocaine.  ESTIMATED BLOOD LOSS: <1 mL   COMPLICATIONS:  None.   DESCRIPTION OF PROCEDURE:  The patient was identified in the holding room and transported to the operating room and placed in the supine position under the operating microscope.  The left eye was identified as the operative eye and it was prepped and draped in the usual sterile ophthalmic fashion.   A 1.0 millimeter clear-corneal paracentesis was made at the 5:00 position. 0.5 ml of preservative-free 1% lidocaine with epinephrine was injected into the anterior chamber.  The anterior chamber was filled with Healon 5 viscoelastic.  A 2.4 millimeter keratome was used to make a near-clear corneal incision at the 2:00 position.  A curvilinear capsulorrhexis was made with a cystotome and capsulorrhexis forceps.  Balanced salt solution was used to hydrodissect and hydrodelineate the nucleus.   Phacoemulsification was then used in stop and chop fashion to remove the lens nucleus and epinucleus.  The remaining cortex was then removed using the irrigation and aspiration handpiece. Healon was then placed into the capsular bag to distend it for lens placement.  A lens was then injected into the capsular bag.  The remaining viscoelastic was aspirated.   Wounds were hydrated  with balanced salt solution.  The anterior chamber was inflated to a physiologic pressure with balanced salt solution.  Intracameral vigamox 0.1 mL undiltued was injected into the eye and a drop placed onto the ocular surface.  No wound leaks were noted.  The patient was taken to the recovery room in stable condition without complications of anesthesia or surgery  Benay Pillow 03/28/2018, 11:15 AM

## 2018-03-28 NOTE — Discharge Instructions (Signed)
Eye Surgery Discharge Instructions    Expect mild scratchy sensation or mild soreness. DO NOT RUB YOUR EYE!  The day of surgery:  Minimal physical activity, but bed rest is not required  No reading, computer work, or close hand work  No bending, lifting, or straining.  May watch TV  For 24 hours:  No driving, legal decisions, or alcoholic beverages  Safety precautions  Eat anything you prefer: It is better to start with liquids, then soup then solid foods.  _____post op eye drop instructions discussed and given to pt  ____ Solar shield eyeglasses should be worn for comfort in the sunlight/patch while sleeping  Resume all regular medications including aspirin or Coumadin if these were discontinued prior to surgery. You may shower, bathe, shave, or wash your hair. Tylenol may be taken for mild discomfort.  Call your doctor if you experience significant pain, nausea, or vomiting, fever > 101 or other signs of infection. [phone] or [phone] Specific instructions:  Follow-up Information    Eulogio Bear, MD Follow up.   Specialty:  Ophthalmology Why:  June 28 at 10:35am in [facility] information: [place] [id number] [phone]

## 2018-03-28 NOTE — Transfer of Care (Signed)
Immediate Anesthesia Transfer of Care Note  Patient: Tracy Espinoza  Procedure(s) Performed: CATARACT EXTRACTION PHACO AND INTRAOCULAR LENS PLACEMENT (IOC) (Left Eye)  Patient Location: PACU  Anesthesia Type:MAC  Level of Consciousness: awake, alert  and oriented  Airway & Oxygen Therapy: Patient Spontanous Breathing  Post-op Assessment: Report given to RN and Post -op Vital signs reviewed and stable  Post vital signs: Reviewed and stable  Last Vitals:  Vitals Value Taken Time  BP    Temp    Pulse    Resp    SpO2      Last Pain:  Vitals:   03/28/18 0940  TempSrc: Oral  PainSc: 3          Complications: No apparent anesthesia complications

## 2018-03-28 NOTE — Anesthesia Post-op Follow-up Note (Signed)
Anesthesia QCDR form completed.        

## 2018-03-28 NOTE — Anesthesia Preprocedure Evaluation (Signed)
Anesthesia Evaluation  Patient identified by MRN, date of birth, ID band Patient awake    Reviewed: Allergy & Precautions, NPO status , Patient's Chart, lab work & pertinent test results, reviewed documented beta blocker date and time   Airway Mallampati: III  TM Distance: <3 FB Neck ROM: full    Dental  (+) Teeth Intact, Poor Dentition, Chipped, Dental Advisory Given,    Pulmonary neg pulmonary ROS,    breath sounds clear to auscultation       Cardiovascular hypertension, Pt. on medications and Pt. on home beta blockers + Valvular Problems/Murmurs  Rhythm:regular Rate:Normal     Neuro/Psych PSYCHIATRIC DISORDERS Anxiety Depression    GI/Hepatic hiatal hernia, GERD  Medicated and Controlled,  Endo/Other  Morbid obesityHypercholesterolemia  Renal/GU Renal disease     Musculoskeletal  (+) Arthritis , Osteoarthritis,  Spinal stenosis Spondylolisthesis L4-5 S/P L4-5 PLIF   Abdominal (+) + obese,   Peds  Hematology  (+) anemia , Sepsis   Anesthesia Other Findings Past Medical History: No date: Anxiety No date: Arthritis No date: Cancer ([place])     Comment:  BASAL CELL SKIN CANCERS REMOVED No date: Depression No date: GERD (gastroesophageal reflux disease) No date: Heart murmur No date: History of hiatal hernia No date: Hypertension No date: Lymphedema  Reproductive/Obstetrics                             Anesthesia Physical  Anesthesia Plan  ASA: III  Anesthesia Plan: MAC   Post-op Pain Management:    Induction: Intravenous  PONV Risk Score and Plan:   Airway Management Planned: Nasal Cannula  Additional Equipment:   Intra-op Plan:   Post-operative Plan:   Informed Consent: I have reviewed the patients History and Physical, chart, labs and discussed the procedure including the risks, benefits and alternatives for the proposed anesthesia with the patient or authorized  representative who has indicated his/her understanding and acceptance.   Dental advisory given  Plan Discussed with: CRNA, Anesthesiologist and Surgeon  Anesthesia Plan Comments:         Anesthesia Quick Evaluation

## 2018-03-29 ENCOUNTER — Encounter: Payer: Self-pay | Admitting: Ophthalmology

## 2018-03-29 NOTE — Addendum Note (Signed)
Addendum  created 03/29/18 1153 by Brailynn Salvage, CRNA   Charge Capture section accepted

## 2018-03-29 NOTE — Addendum Note (Signed)
Addendum  created 03/29/18 0930 by Ramey Schiff, Einar Grad, CRNA   Charge Capture section accepted

## 2018-04-01 ENCOUNTER — Ambulatory Visit: Admit: 2018-04-01 | Payer: Medicare Other | Admitting: Ophthalmology

## 2018-04-01 SURGERY — PHACOEMULSIFICATION, CATARACT, WITH IOL INSERTION
Anesthesia: Topical | Laterality: Left

## 2018-04-22 DIAGNOSIS — L820 Inflamed seborrheic keratosis: Secondary | ICD-10-CM | POA: Diagnosis not present

## 2018-04-22 DIAGNOSIS — L578 Other skin changes due to chronic exposure to nonionizing radiation: Secondary | ICD-10-CM | POA: Diagnosis not present

## 2018-04-22 DIAGNOSIS — D485 Neoplasm of uncertain behavior of skin: Secondary | ICD-10-CM | POA: Diagnosis not present

## 2018-04-22 DIAGNOSIS — C44519 Basal cell carcinoma of skin of other part of trunk: Secondary | ICD-10-CM | POA: Diagnosis not present

## 2018-04-22 DIAGNOSIS — D225 Melanocytic nevi of trunk: Secondary | ICD-10-CM | POA: Diagnosis not present

## 2018-04-22 DIAGNOSIS — D2230 Melanocytic nevi of unspecified part of face: Secondary | ICD-10-CM | POA: Diagnosis not present

## 2018-04-22 DIAGNOSIS — L821 Other seborrheic keratosis: Secondary | ICD-10-CM | POA: Diagnosis not present

## 2018-04-22 DIAGNOSIS — L720 Epidermal cyst: Secondary | ICD-10-CM | POA: Diagnosis not present

## 2018-04-22 DIAGNOSIS — L812 Freckles: Secondary | ICD-10-CM | POA: Diagnosis not present

## 2018-04-22 DIAGNOSIS — Z1283 Encounter for screening for malignant neoplasm of skin: Secondary | ICD-10-CM | POA: Diagnosis not present

## 2018-04-22 DIAGNOSIS — D229 Melanocytic nevi, unspecified: Secondary | ICD-10-CM | POA: Diagnosis not present

## 2018-04-22 DIAGNOSIS — D3617 Benign neoplasm of peripheral nerves and autonomic nervous system of trunk, unspecified: Secondary | ICD-10-CM | POA: Diagnosis not present

## 2018-04-22 DIAGNOSIS — C4441 Basal cell carcinoma of skin of scalp and neck: Secondary | ICD-10-CM | POA: Diagnosis not present

## 2018-05-06 ENCOUNTER — Other Ambulatory Visit: Payer: Self-pay | Admitting: Family Medicine

## 2018-05-06 NOTE — Telephone Encounter (Signed)
Rx refill request: Trazodone 50 mg        Last filled: 02/04/18   LOV: 03/20/18  PCP: [place]: verified

## 2018-05-07 DIAGNOSIS — H2511 Age-related nuclear cataract, right eye: Secondary | ICD-10-CM | POA: Diagnosis not present

## 2018-05-13 ENCOUNTER — Encounter: Payer: Self-pay | Admitting: *Deleted

## 2018-05-16 ENCOUNTER — Other Ambulatory Visit: Payer: Self-pay

## 2018-05-16 ENCOUNTER — Ambulatory Visit: Payer: Medicare Other | Admitting: Certified Registered"

## 2018-05-16 ENCOUNTER — Encounter: Payer: Self-pay | Admitting: *Deleted

## 2018-05-16 ENCOUNTER — Encounter
Admission: RE | Disposition: A | Discharge status: Home / Self Care | Payer: Self-pay | Source: Ambulatory Visit | Attending: Ophthalmology

## 2018-05-16 ENCOUNTER — Ambulatory Visit
Admission: RE | Admit: 2018-05-16 | Discharge: 2018-05-16 | Disposition: A | Discharge status: Home / Self Care | Payer: Medicare Other | Source: Ambulatory Visit | Attending: Ophthalmology | Admitting: Ophthalmology

## 2018-05-16 DIAGNOSIS — F419 Anxiety disorder, unspecified: Secondary | ICD-10-CM | POA: Insufficient documentation

## 2018-05-16 DIAGNOSIS — I890 Lymphedema, not elsewhere classified: Secondary | ICD-10-CM | POA: Insufficient documentation

## 2018-05-16 DIAGNOSIS — Z9842 Cataract extraction status, left eye: Secondary | ICD-10-CM | POA: Insufficient documentation

## 2018-05-16 DIAGNOSIS — Z885 Allergy status to narcotic agent status: Secondary | ICD-10-CM | POA: Insufficient documentation

## 2018-05-16 DIAGNOSIS — Z8614 Personal history of Methicillin resistant Staphylococcus aureus infection: Secondary | ICD-10-CM | POA: Diagnosis not present

## 2018-05-16 DIAGNOSIS — K219 Gastro-esophageal reflux disease without esophagitis: Secondary | ICD-10-CM | POA: Insufficient documentation

## 2018-05-16 DIAGNOSIS — Z91048 Other nonmedicinal substance allergy status: Secondary | ICD-10-CM | POA: Diagnosis not present

## 2018-05-16 DIAGNOSIS — Z85828 Personal history of other malignant neoplasm of skin: Secondary | ICD-10-CM | POA: Insufficient documentation

## 2018-05-16 DIAGNOSIS — F329 Major depressive disorder, single episode, unspecified: Secondary | ICD-10-CM | POA: Diagnosis not present

## 2018-05-16 DIAGNOSIS — Z791 Long term (current) use of non-steroidal anti-inflammatories (NSAID): Secondary | ICD-10-CM | POA: Diagnosis not present

## 2018-05-16 DIAGNOSIS — I10 Essential (primary) hypertension: Secondary | ICD-10-CM | POA: Diagnosis not present

## 2018-05-16 DIAGNOSIS — Z79899 Other long term (current) drug therapy: Secondary | ICD-10-CM | POA: Diagnosis not present

## 2018-05-16 DIAGNOSIS — H2511 Age-related nuclear cataract, right eye: Secondary | ICD-10-CM | POA: Diagnosis not present

## 2018-05-16 DIAGNOSIS — Z7982 Long term (current) use of aspirin: Secondary | ICD-10-CM | POA: Diagnosis not present

## 2018-05-16 HISTORY — DX: Edema, unspecified: R60.9

## 2018-05-16 HISTORY — DX: Carrier or suspected carrier of methicillin resistant Staphylococcus aureus: Z22.322

## 2018-05-16 HISTORY — PX: CATARACT EXTRACTION W/PHACO: SHX586

## 2018-05-16 SURGERY — PHACOEMULSIFICATION, CATARACT, WITH IOL INSERTION
Anesthesia: Monitor Anesthesia Care | Site: Eye | Laterality: Right | Wound class: Clean

## 2018-05-16 MED ORDER — SODIUM HYALURONATE 23 MG/ML IO SOLN
INTRAOCULAR | Status: AC
  Filled 2018-05-16: qty 0.6

## 2018-05-16 MED ORDER — SODIUM CHLORIDE 0.9 % IV SOLN
INTRAVENOUS | Status: DC
  Administered 2018-05-16: 08:00:00 via INTRAVENOUS

## 2018-05-16 MED ORDER — TETRACAINE HCL 0.5 % OP SOLN
1.0000 [drp] | Freq: Once | OPHTHALMIC | Status: AC
  Administered 2018-05-16: 1 [drp] via OPHTHALMIC

## 2018-05-16 MED ORDER — MOXIFLOXACIN HCL 0.5 % OP SOLN
OPHTHALMIC | Status: AC
  Filled 2018-05-16: qty 3

## 2018-05-16 MED ORDER — SODIUM HYALURONATE 23 MG/ML IO SOLN
INTRAOCULAR | Status: DC | PRN
  Administered 2018-05-16: 0.6 mL via INTRAOCULAR

## 2018-05-16 MED ORDER — GLYCOPYRROLATE 0.2 MG/ML IJ SOLN
INTRAMUSCULAR | Status: DC | PRN
  Administered 2018-05-16: 0.1 mg via INTRAVENOUS

## 2018-05-16 MED ORDER — MOXIFLOXACIN HCL 0.5 % OP SOLN: 1.0000 [drp] | OPHTHALMIC | Status: DC | PRN

## 2018-05-16 MED ORDER — CYCLOPENTOLATE HCL 2 % OP SOLN
1.0000 [drp] | OPHTHALMIC | Status: DC
  Administered 2018-05-16 (×3): 1 [drp] via OPHTHALMIC

## 2018-05-16 MED ORDER — CYCLOPENTOLATE HCL 2 % OP SOLN
OPHTHALMIC | Status: AC
  Filled 2018-05-16: qty 2

## 2018-05-16 MED ORDER — SODIUM HYALURONATE 10 MG/ML IO SOLN
INTRAOCULAR | Status: DC | PRN
  Administered 2018-05-16: 0.55 mL via INTRAOCULAR

## 2018-05-16 MED ORDER — PHENYLEPHRINE HCL 10 % OP SOLN
1.0000 [drp] | OPHTHALMIC | Status: DC
  Administered 2018-05-16 (×4): 1 [drp] via OPHTHALMIC

## 2018-05-16 MED ORDER — PHENYLEPHRINE HCL 10 % OP SOLN
OPHTHALMIC | Status: AC
  Administered 2018-05-16: 1 [drp] via OPHTHALMIC
  Filled 2018-05-16: qty 5

## 2018-05-16 MED ORDER — MOXIFLOXACIN HCL 0.5 % OP SOLN
OPHTHALMIC | Status: DC | PRN
  Administered 2018-05-16: 0.2 mL via OPHTHALMIC

## 2018-05-16 MED ORDER — GLYCOPYRROLATE 0.2 MG/ML IJ SOLN
INTRAMUSCULAR | Status: AC
  Filled 2018-05-16: qty 1

## 2018-05-16 MED ORDER — TETRACAINE HCL 0.5 % OP SOLN
OPHTHALMIC | Status: AC
  Administered 2018-05-16: 1 [drp] via OPHTHALMIC
  Filled 2018-05-16: qty 4

## 2018-05-16 MED ORDER — MIDAZOLAM HCL 2 MG/2ML IJ SOLN
INTRAMUSCULAR | Status: DC | PRN
  Administered 2018-05-16: 2 mg via INTRAVENOUS

## 2018-05-16 MED ORDER — DEXMEDETOMIDINE HCL 200 MCG/2ML IV SOLN
INTRAVENOUS | Status: DC | PRN
  Administered 2018-05-16: 8 ug via INTRAVENOUS
  Administered 2018-05-16: 12 ug via INTRAVENOUS

## 2018-05-16 MED ORDER — LIDOCAINE HCL (PF) 4 % IJ SOLN
INTRAMUSCULAR | Status: AC
  Filled 2018-05-16: qty 5

## 2018-05-16 MED ORDER — EPINEPHRINE PF 1 MG/ML IJ SOLN
INTRAMUSCULAR | Status: DC | PRN
  Administered 2018-05-16: 09:00:00 via OPHTHALMIC

## 2018-05-16 MED ORDER — POVIDONE-IODINE 5 % OP SOLN
OPHTHALMIC | Status: AC
  Filled 2018-05-16: qty 30

## 2018-05-16 MED ORDER — MIDAZOLAM HCL 2 MG/2ML IJ SOLN
INTRAMUSCULAR | Status: AC
  Filled 2018-05-16: qty 2

## 2018-05-16 MED ORDER — POVIDONE-IODINE 5 % OP SOLN
OPHTHALMIC | Status: DC | PRN
  Administered 2018-05-16: 1 via OPHTHALMIC

## 2018-05-16 MED ORDER — EPINEPHRINE PF 1 MG/ML IJ SOLN
INTRAMUSCULAR | Status: AC
  Filled 2018-05-16: qty 2

## 2018-05-16 MED ORDER — LIDOCAINE HCL (PF) 4 % IJ SOLN
INTRAMUSCULAR | Status: DC | PRN
  Administered 2018-05-16: 4 mL via OPHTHALMIC

## 2018-05-16 SURGICAL SUPPLY — 16 items

## 2018-05-16 NOTE — Transfer of Care (Signed)
Immediate Anesthesia Transfer of Care Note  Patient: Tracy Espinoza  Procedure(s) Performed: CATARACT EXTRACTION PHACO AND INTRAOCULAR LENS PLACEMENT (IOC) (Right Eye)  Patient Location: PACU  Anesthesia Type:MAC  Level of Consciousness: awake, alert  and oriented  Airway & Oxygen Therapy: Patient Spontanous Breathing  Post-op Assessment: Report given to RN and Post -op Vital signs reviewed and stable  Post vital signs: Reviewed and stable  Last Vitals:  Vitals Value Taken Time  BP 106/39 05/16/2018  9:16 AM  Temp 35.8 C 05/16/2018  9:16 AM  Pulse 62 05/16/2018  9:16 AM  Resp 15 05/16/2018  9:16 AM  SpO2 94 % 05/16/2018  9:16 AM    Last Pain:  Vitals:   05/16/18 0916  TempSrc: Temporal  PainSc:          Complications: No apparent anesthesia complications

## 2018-05-16 NOTE — Op Note (Signed)
OPERATIVE NOTE  Tracy Espinoza [id number] 05/16/2018   PREOPERATIVE DIAGNOSIS:  Nuclear sclerotic cataract right eye.  H25.11   POSTOPERATIVE DIAGNOSIS:    Nuclear sclerotic cataract right eye.     PROCEDURE:  Phacoemusification with posterior chamber intraocular lens placement of the right eye   LENS:   Implant Name Type Inv. Item Serial No. Manufacturer Lot No. LRB No. Used  LENS IOL DIOP 17.5 - [id number] Intraocular Lens LENS IOL DIOP 17.5 [id number] AMO  Right 1       PCB00 +17.5   ULTRASOUND TIME: 0 minutes 26 seconds.  CDE 2.01   SURGEON:  Benay Pillow, MD, MPH  ANESTHESIOLOGIST: Anesthesiologist: Emmie Niemann, MD CRNA: Nile Riggs, CRNA; Johnna Acosta, CRNA   ANESTHESIA:  Topical with tetracaine drops augmented with 1% preservative-free intracameral lidocaine.  ESTIMATED BLOOD LOSS: less than 1 mL.   COMPLICATIONS:  None.   DESCRIPTION OF PROCEDURE:  The patient was identified in the holding room and transported to the operating room and placed in the supine position under the operating microscope.  The right eye was identified as the operative eye and it was prepped and draped in the usual sterile ophthalmic fashion.   A 1.0 millimeter clear-corneal paracentesis was made at the 10:30 position. 0.5 ml of preservative-free 1% lidocaine with epinephrine was injected into the anterior chamber.  The anterior chamber was filled with Healon 5 viscoelastic.  A 2.4 millimeter keratome was used to make a near-clear corneal incision at the 8:00 position.  A curvilinear capsulorrhexis was made with a cystotome and capsulorrhexis forceps.  Balanced salt solution was used to hydrodissect and hydrodelineate the nucleus.   Phacoemulsification was then used in stop and chop fashion to remove the lens nucleus and epinucleus.  The remaining cortex was then removed using the irrigation and aspiration handpiece. Healon was then placed into the capsular bag to distend it for  lens placement.  A lens was then injected into the capsular bag.  The remaining viscoelastic was aspirated.   Wounds were hydrated with balanced salt solution.  The anterior chamber was inflated to a physiologic pressure with balanced salt solution.   Intracameral vigamox 0.1 mL undiluted was injected into the eye and a drop placed onto the ocular surface.  No wound leaks were noted.  The patient was taken to the recovery room in stable condition without complications of anesthesia or surgery  Benay Pillow 05/16/2018, 9:12 AM

## 2018-05-16 NOTE — Discharge Instructions (Signed)
Eye Surgery Discharge Instructions ° °Expect mild scratchy sensation or mild soreness. °DO NOT RUB YOUR EYE! ° °The day of surgery: °• Minimal physical activity, but bed rest is not required °• No reading, computer work, or close hand work °• No bending, lifting, or straining. °• May watch TV ° °For 24 hours: °• No driving, legal decisions, or alcoholic beverages °• Safety precautions °• Eat anything you prefer: It is better to start with liquids, then soup then solid foods. °• _____ Eye patch should be worn until postoperative exam tomorrow. °• ____ Solar shield eyeglasses should be worn for comfort in the sunlight/patch while sleeping ° °Resume all regular medications including aspirin or Coumadin if these were discontinued prior to surgery. °You may shower, bathe, shave, or wash your hair. °Tylenol may be taken for mild discomfort. ° °Call your doctor if you experience significant pain, nausea, or vomiting, fever > 101 or other signs of infection. [phone] or [phone] °Specific instructions: ° ° Eye Surgery Discharge Instructions ° °Expect mild scratchy sensation or mild soreness. °DO NOT RUB YOUR EYE! ° °The day of surgery: °• Minimal physical activity, but bed rest is not required °• No reading, computer work, or close hand work °• No bending, lifting, or straining. °• May watch TV ° °For 24 hours: °• No driving, legal decisions, or alcoholic beverages °• Safety precautions °• Eat anything you prefer: It is better to start with liquids, then soup then solid foods. °• _____ Eye patch should be worn until postoperative exam tomorrow. °• ____ Solar shield eyeglasses should be worn for comfort in the sunlight/patch while sleeping ° °Resume all regular medications including aspirin or Coumadin if these were discontinued prior to surgery. °You may shower, bathe, shave, or wash your hair. °Tylenol may be taken for mild discomfort. ° °Call your doctor if you experience significant pain, nausea, or vomiting, fever  > 101 or other signs of infection. [phone] or [phone] °Specific instructions: ° ° °

## 2018-05-16 NOTE — Anesthesia Postprocedure Evaluation (Signed)
Anesthesia Post Note  Patient: Tracy Espinoza  Procedure(s) Performed: CATARACT EXTRACTION PHACO AND INTRAOCULAR LENS PLACEMENT ([place]) (Right Eye)  Patient location during evaluation: PACU Anesthesia Type: MAC Level of consciousness: awake and alert and oriented Pain management: pain level controlled Vital Signs Assessment: post-procedure vital signs reviewed and stable Respiratory status: spontaneous breathing, nonlabored ventilation and respiratory function stable Cardiovascular status: blood pressure returned to baseline and stable Postop Assessment: no signs of nausea or vomiting Anesthetic complications: no     Last Vitals:  Vitals:   05/16/18 0916 05/16/18 0927  BP: (!) 106/39 (!) 110/57  Pulse: 62 61  Resp: 15   Temp: (!) 35.8 C   SpO2: 94% 95%    Last Pain:  Vitals:   05/16/18 0927  TempSrc:   PainSc: 0-No pain                 [name]

## 2018-05-16 NOTE — Anesthesia Post-op Follow-up Note (Signed)
Anesthesia QCDR form completed.        

## 2018-05-16 NOTE — H&P (Signed)
The History and Physical notes are on paper, have been signed, and are to be scanned.   I have examined the patient and there are no changes to the H&P.   Benay Pillow 05/16/2018 8:41 AM

## 2018-05-16 NOTE — Anesthesia Preprocedure Evaluation (Signed)
Anesthesia Evaluation  Patient identified by MRN, date of birth, ID band Patient awake    Reviewed: Allergy & Precautions, NPO status , Patient's Chart, lab work & pertinent test results  History of Anesthesia Complications Negative for: history of anesthetic complications  Airway Mallampati: II  TM Distance: >3 FB Neck ROM: Full    Dental no notable dental hx.    Pulmonary neg pulmonary ROS, neg sleep apnea, neg COPD,    breath sounds clear to auscultation- rhonchi (-) wheezing      Cardiovascular Exercise Tolerance: Good hypertension, Pt. on medications (-) CAD, (-) Past MI, (-) Cardiac Stents and (-) CABG  Rhythm:Regular Rate:Normal - Systolic murmurs and - Diastolic murmurs    Neuro/Psych PSYCHIATRIC DISORDERS Anxiety Depression negative neurological ROS     GI/Hepatic Neg liver ROS, hiatal hernia, GERD  ,  Endo/Other  negative endocrine ROSneg diabetes  Renal/GU negative Renal ROS     Musculoskeletal  (+) Arthritis ,   Abdominal (+) + obese,   Peds  Hematology  (+) anemia ,   Anesthesia Other Findings Past Medical History: No date: Anxiety No date: Arthritis No date: Cancer ([place])     Comment:  BASAL CELL SKIN CANCERS REMOVED No date: Depression No date: Edema     Comment:  FEET/LEGS No date: GERD (gastroesophageal reflux disease) No date: Heart murmur No date: History of hiatal hernia No date: Hypertension No date: Lymphedema No date: MRSA (methicillin resistant staph aureus) culture positive     Comment:  H/O   Reproductive/Obstetrics                             Anesthesia Physical Anesthesia Plan  ASA: II  Anesthesia Plan: MAC   Post-op Pain Management:    Induction: Intravenous  PONV Risk Score and Plan: 2 and Midazolam  Airway Management Planned: Natural Airway  Additional Equipment:   Intra-op Plan:   Post-operative Plan:   Informed Consent: I have  reviewed the patients History and Physical, chart, labs and discussed the procedure including the risks, benefits and alternatives for the proposed anesthesia with the patient or authorized representative who has indicated his/her understanding and acceptance.     Plan Discussed with: CRNA and Anesthesiologist  Anesthesia Plan Comments:         Anesthesia Quick Evaluation

## 2018-06-11 DIAGNOSIS — Z23 Encounter for immunization: Secondary | ICD-10-CM | POA: Diagnosis not present

## 2018-06-19 DIAGNOSIS — M898X9 Other specified disorders of bone, unspecified site: Secondary | ICD-10-CM | POA: Diagnosis not present

## 2018-06-19 DIAGNOSIS — S32009K Unspecified fracture of unspecified lumbar vertebra, subsequent encounter for fracture with nonunion: Secondary | ICD-10-CM | POA: Diagnosis not present

## 2018-06-24 DIAGNOSIS — R208 Other disturbances of skin sensation: Secondary | ICD-10-CM | POA: Diagnosis not present

## 2018-06-24 DIAGNOSIS — L720 Epidermal cyst: Secondary | ICD-10-CM | POA: Diagnosis not present

## 2018-06-24 DIAGNOSIS — L820 Inflamed seborrheic keratosis: Secondary | ICD-10-CM | POA: Diagnosis not present

## 2018-06-28 ENCOUNTER — Ambulatory Visit (INDEPENDENT_AMBULATORY_CARE_PROVIDER_SITE_OTHER): Payer: Medicare Other | Admitting: Vascular Surgery

## 2018-06-28 ENCOUNTER — Encounter (INDEPENDENT_AMBULATORY_CARE_PROVIDER_SITE_OTHER): Payer: Self-pay | Admitting: Vascular Surgery

## 2018-06-28 VITALS — BP 142/68 | HR 72 | Resp 19 | Ht 61.0 in | Wt 284.0 lb

## 2018-06-28 DIAGNOSIS — Z6841 Body Mass Index (BMI) 40.0 and over, adult: Secondary | ICD-10-CM

## 2018-06-28 DIAGNOSIS — I890 Lymphedema, not elsewhere classified: Secondary | ICD-10-CM

## 2018-06-28 DIAGNOSIS — I10 Essential (primary) hypertension: Secondary | ICD-10-CM

## 2018-06-28 NOTE — Assessment & Plan Note (Signed)
Relatively good control with the lymphedema pump and the Velcro compression system.  I would continue these daily.  We again discussed that this is not a process that we plan to cure, but as long as we keep maintaining good symptom relief that would be our goal of therapy.  I will plan to see her back in 1 year.

## 2018-06-28 NOTE — Progress Notes (Signed)
MRN : [id number]  Tracy Espinoza is a 71 y.o. ([birth year]) female who presents with chief complaint of  Chief Complaint  Patient presents with  . Follow-up    6 month no studies  .  History of Present Illness: Patient returns today in follow up of her leg swelling and lymphedema.  Although she still has some pain and swelling in her legs, the Velcro compression wraps and the regular use of the lymphedema pump has significantly improved her symptoms.  Her pain and swelling are generally better on a day-to-day basis.  She says she does still have flares where the swelling seems worse.  She may have a little more swelling in her thighs, but this does not hurt nearly as bad.  No ulceration or infection.  Current Outpatient Medications  Medication Sig Dispense Refill  . acetaminophen (ACETAMINOPHEN 8 HOUR) 650 MG CR tablet Take 650 mg by mouth every 8 (eight) hours as needed for pain.     . Ascorbic Acid (VITAMIN C) 1000 MG tablet Take 1,000 mg by mouth daily.    Marland Kitchen aspirin EC 81 MG tablet Take 81 mg by mouth daily.    . Calcium Carb-Cholecalciferol (CALCIUM 600+D) 600-800 MG-UNIT TABS Take 1 tablet by mouth daily.     . calcium carbonate (OS-CAL - DOSED IN MG OF ELEMENTAL CALCIUM) 1250 (500 Ca) MG tablet Take by mouth.    . cetirizine (ZYRTEC) 10 MG tablet Take 10 mg by mouth at bedtime.     . Cranberry 425 MG CAPS Take by mouth.    . diclofenac sodium (VOLTAREN) 1 % GEL Apply 1 application topically 4 (four) times daily as needed (for pain).     Marland Kitchen doxycycline (VIBRAMYCIN) 100 MG capsule Take 100 mg by mouth at bedtime.     . hydrochlorothiazide (HYDRODIURIL) 25 MG tablet Take 1 tablet (25 mg total) by mouth daily. 90 tablet 4  . loperamide (IMODIUM A-D) 2 MG tablet Take 2-4 mg by mouth 4 (four) times daily as needed for diarrhea or loose stools.    Marland Kitchen LORazepam (ATIVAN) 0.5 MG tablet Take 1 tablet (0.5 mg total) by mouth daily. Take only for bad days 135 tablet 1  . losartan (COZAAR) 100 MG  tablet Take 1 tablet (100 mg total) by mouth daily. 90 tablet 4  . metoprolol succinate (TOPROL-XL) 50 MG 24 hr tablet Take 1 tablet (50 mg total) by mouth daily. 90 tablet 4  . Multiple Vitamin (MULTIVITAMIN) tablet Take 1 tablet by mouth daily.    . Omega-3 Fatty Acids (OMEGA-3 FISH OIL PO) Take 2 capsules by mouth daily.     Marland Kitchen omeprazole (PRILOSEC) 40 MG capsule Take 1 capsule (40 mg total) by mouth 2 (two) times daily. 180 capsule 4  . Probiotic Product ([employer] DAILY PROBIOTIC PO) Take 1 capsule by mouth daily.     . traMADol (ULTRAM) 50 MG tablet Take 1 tablet (50 mg total) by mouth 2 (two) times daily as needed. (Patient taking differently: Take 50 mg by mouth 2 (two) times daily. ) 180 tablet 1  . traZODone (DESYREL) 50 MG tablet Take 1-2 tablets (50-100 mg total) by mouth at bedtime. (Patient taking differently: Take 100 mg by mouth at bedtime. ) 180 tablet 4   No current facility-administered medications for this visit.     Past Medical History:  Diagnosis Date  . Anxiety   . Arthritis   . Cancer ([place])    BASAL CELL SKIN CANCERS REMOVED  .  Depression   . Edema    FEET/LEGS  . GERD (gastroesophageal reflux disease)   . Heart murmur   . History of hiatal hernia   . Hypertension   . Lymphedema   . MRSA (methicillin resistant staph aureus) culture positive    H/O    Past Surgical History:  Procedure Laterality Date  . ABDOMINAL HYSTERECTOMY    . APPENDECTOMY    . BACK SURGERY    . CATARACT EXTRACTION W/PHACO Left 03/28/2018   Procedure: CATARACT EXTRACTION PHACO AND INTRAOCULAR LENS PLACEMENT (IOC);  Surgeon: Eulogio Bear, MD;  Location: [facility] ORS;  Service: Ophthalmology;  Laterality: Left;  [place] 00:28.2 AP% 7.9 CDE 2.21 Fluid pack lot # [id number]  . CATARACT EXTRACTION W/PHACO Right 05/16/2018   Procedure: CATARACT EXTRACTION PHACO AND INTRAOCULAR LENS PLACEMENT (IOC);  Surgeon: Eulogio Bear, MD;  Location: [facility] OR[facility];  Service: Ophthalmology;  Laterality: Right;  [place]  00:26.2 AP% 7.6 CDE 2.01 Fluid pack lot # [id number]  . CHOLECYSTECTOMY    . COLONOSCOPY    . FRACTURE SURGERY     BONE ON SIDE OF RIGHT FOOT  . HERNIA REPAIR     UMBILICAL X 2  . JOINT REPLACEMENT Bilateral    knees  . KIDNEY SURGERY    . LUMBAR WOUND DEBRIDEMENT N/A 08/03/2016   Procedure: IRRIGATION AND DEBRIDEMENT LUMBAR WOUND;  Surgeon: Eustace Moore, MD;  Location: [facility];  Service: Neurosurgery;  Laterality: N/A;  . OVARIAN CYST SURGERY    . REPAIR OF LEFT URETER Left 40 YRS AGO  . ROTATOR CUFF REPAIR  2014  . TONSILLECTOMY    . TUBAL LIGATION      Social History Social History   Tobacco Use  . Smoking status: Never Smoker  . Smokeless tobacco: Never Used  Substance Use Topics  . Alcohol use: No  . Drug use: No     Family History Family History  Problem Relation Age of Onset  . Hypertension Mother   . Stroke Mother   . Alzheimer's disease Mother   . Cancer Father        brain  . Hypertension Father   . Stroke Father     Allergies  Allergen Reactions  . Tape Itching, Dermatitis and Rash  . 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 Band-Aid Plus Antibiotic [Bacitracin-Polymyxin B] Other (See Comments)    Unknown  . Iodinated Diagnostic Agents Itching  . Morphine Sulfate Itching  . Vancomycin Rash    REVIEW OF SYSTEMS (Negative unless checked)  Constitutional: [] Weight loss  [] Fever  [] Chills Cardiac: [] Chest pain   [] Chest pressure   [] Palpitations   [] Shortness of breath when laying flat   [] Shortness of breath at rest   [] Shortness of breath with exertion. Vascular:  [] Pain in legs with walking   [] Pain in legs at rest   [] Pain in legs when laying flat   [] Claudication   [] Pain in feet when walking  [] Pain in feet at rest  [] Pain in feet when laying flat   [] History of DVT   [] Phlebitis   [x] Swelling in legs   [x] Varicose veins   [] Non-healing ulcers Pulmonary:   [] Uses home oxygen   [] Productive cough    [] Hemoptysis   [] Wheeze  [] COPD   [] Asthma Neurologic:  [] Dizziness  [] Blackouts   [] Seizures   [] History of stroke   [] History of TIA  [] Aphasia   [] Temporary blindness   [] Dysphagia   [] Weakness or numbness  in arms   [] Weakness or numbness in legs Musculoskeletal:  [x] Arthritis   [] Joint swelling   [x] Joint pain   [x] Low back pain Hematologic:  [] Easy bruising  [] Easy bleeding   [] Hypercoagulable state   [] Anemic   Gastrointestinal:  [] Blood in stool   [] Vomiting blood  [x] Gastroesophageal reflux/heartburn   [] Abdominal pain Genitourinary:  [] Chronic kidney disease   [] Difficult urination  [] Frequent urination  [] Burning with urination   [] Hematuria Skin:  [] Rashes   [] Ulcers   [] Wounds Psychological:  [] History of anxiety   []  History of major depression.    Physical Examination  BP (!) 142/68   Pulse 72   Resp 19   Ht 5\' 1"  (1.549 m)   Wt 284 lb (128.8 kg)   BMI 53.66 kg/m  Gen:  WD/WN, NAD Head: [place]/AT, No temporalis wasting. Ear/Nose/Throat: Hearing grossly intact, nares w/o erythema or drainage Eyes: Conjunctiva clear. Sclera non-icteric Neck: Supple.  Trachea midline Pulmonary:  Good air movement, no use of accessory muscles.  Cardiac: RRR, no JVD Vascular:  Vessel Right Left  Radial Palpable Palpable                                    Musculoskeletal: M/S 5/5 throughout. Uses a walker. 1+ RLE edema, 1-2+ LLE edema. Neurologic: Sensation grossly intact in extremities.  Symmetrical.  Speech is fluent.  Psychiatric: Judgment intact, Mood & affect appropriate for pt's clinical situation. Dermatologic: No rashes or ulcers noted.  No cellulitis or open wounds.       Labs No results found for this or any previous visit (from the past 2160 hour(s)).  Radiology No results found.  Assessment/Plan Hypertension blood pressure control important in reducing the progression of atherosclerotic disease. On appropriate oral medications.   BMI 78.5-88.5,  adult Certainly worsens lower extremity swelling  Lymphedema Relatively good control with the lymphedema pump and the Velcro compression system.  I would continue these daily.  We again discussed that this is not a process that we plan to cure, but as long as we keep maintaining good symptom relief that would be our goal of therapy.  I will plan to see her back in 1 year.    Leotis Pain, MD  06/28/2018 1:43 PM    This note was created with Dragon medical transcription system.  Any errors from dictation are purely unintentional

## 2018-06-28 NOTE — Patient Instructions (Signed)

## 2018-07-01 DIAGNOSIS — M19072 Primary osteoarthritis, left ankle and foot: Secondary | ICD-10-CM | POA: Diagnosis not present

## 2018-07-01 DIAGNOSIS — M79672 Pain in left foot: Secondary | ICD-10-CM | POA: Diagnosis not present

## 2018-07-12 ENCOUNTER — Other Ambulatory Visit: Payer: Self-pay

## 2018-07-12 DIAGNOSIS — F419 Anxiety disorder, unspecified: Secondary | ICD-10-CM

## 2018-07-24 ENCOUNTER — Encounter: Payer: Self-pay | Admitting: Obstetrics and Gynecology

## 2018-07-24 ENCOUNTER — Ambulatory Visit (INDEPENDENT_AMBULATORY_CARE_PROVIDER_SITE_OTHER): Payer: Medicare Other | Admitting: Obstetrics and Gynecology

## 2018-07-24 VITALS — BP 134/71 | HR 66 | Ht 61.0 in | Wt 259.0 lb

## 2018-07-24 DIAGNOSIS — A630 Anogenital (venereal) warts: Secondary | ICD-10-CM | POA: Diagnosis not present

## 2018-07-24 DIAGNOSIS — N9089 Other specified noninflammatory disorders of vulva and perineum: Secondary | ICD-10-CM | POA: Diagnosis not present

## 2018-07-24 NOTE — Progress Notes (Signed)
Pt here today for vaginal bumps. Pt states they have been there for years, now they seem to be spreading. The bumps are "tender feeling and irritated from time to time".
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
## 2018-08-20 ENCOUNTER — Encounter: Payer: Self-pay | Admitting: Infectious Diseases

## 2018-08-20 ENCOUNTER — Ambulatory Visit: Payer: Medicare Other | Attending: Infectious Diseases | Admitting: Infectious Diseases

## 2018-08-20 VITALS — BP 145/89 | HR 71 | Temp 97.8°F

## 2018-08-20 DIAGNOSIS — Z881 Allergy status to other antibiotic agents status: Secondary | ICD-10-CM

## 2018-08-20 DIAGNOSIS — Z792 Long term (current) use of antibiotics: Secondary | ICD-10-CM

## 2018-08-20 DIAGNOSIS — Z8614 Personal history of Methicillin resistant Staphylococcus aureus infection: Secondary | ICD-10-CM

## 2018-08-20 DIAGNOSIS — M869 Osteomyelitis, unspecified: Principal | ICD-10-CM

## 2018-08-20 DIAGNOSIS — Z91041 Radiographic dye allergy status: Secondary | ICD-10-CM

## 2018-08-20 DIAGNOSIS — Z09 Encounter for follow-up examination after completed treatment for conditions other than malignant neoplasm: Secondary | ICD-10-CM

## 2018-08-20 DIAGNOSIS — Z981 Arthrodesis status: Secondary | ICD-10-CM | POA: Diagnosis not present

## 2018-08-20 DIAGNOSIS — Z885 Allergy status to narcotic agent status: Secondary | ICD-10-CM

## 2018-08-20 DIAGNOSIS — R197 Diarrhea, unspecified: Secondary | ICD-10-CM

## 2018-08-20 DIAGNOSIS — M4626 Osteomyelitis of vertebra, lumbar region: Secondary | ICD-10-CM

## 2018-08-20 DIAGNOSIS — Z8619 Personal history of other infectious and parasitic diseases: Secondary | ICD-10-CM | POA: Diagnosis not present

## 2018-08-20 DIAGNOSIS — I890 Lymphedema, not elsewhere classified: Secondary | ICD-10-CM

## 2018-08-20 DIAGNOSIS — Z91048 Other nonmedicinal substance allergy status: Secondary | ICD-10-CM

## 2018-08-20 DIAGNOSIS — Z96653 Presence of artificial knee joint, bilateral: Secondary | ICD-10-CM | POA: Diagnosis not present

## 2018-08-20 NOTE — Patient Instructions (Addendum)
You are here for follow up of a lumbar spine infection after  surgery which you had in 2017 and has been treated and currently on suppressive therapy with Doxy 177m once a day as you have hardware and the infection was due to MRSA. You were followed by Dr.Fitzgerald before. Today we will check ESR, CMP- you will continue Doxy indefinitely 101monce a day.

## 2018-08-20 NOTE — Progress Notes (Signed)
NAME: Tracy Espinoza  DOB: 1946/09/23  MRN: [id number]  Date/Time: 08/20/2018 9:16 AM Subjective:  REASON FOR CONSULT: MRSA lumbar spine infection ? Tracy Espinoza is a 72 y.o. female with a history of lumbar spine surgery in 6967, complicated by MRSA infection , treated with IV vanco/Dapto/vanco along with surgical debridement and is now on suppressive doxy therapy is here to engage in care with me as her previous ID provider Dr.Fitzgerald is not in the country now. Pt is doing well Back pain is much better But she says when she stops doxy the pain returns in 2 days She last Saw Dr.Esner in sept 2019 and the xray done that time had shown fusion of the bones at the site of the surgery. She has a little diarrhea and uses imodium as needed. She doe snot go out in the sun. Has b/l TKA which has not bothered her and she has lymphedema legs and wears compression stocking. Last Esr 11 from feb 2019  Medical history On 07/10/16 she had L4-L5 decompressive laminectomy decompression of L4 and L5 nerve roots, posterior lumbar interbody arthrodesis with peek spacers local autograft and allograft, pedicle screw fixation L4-L5, posterior lateral arthrodesis L4-L5  By Dr.Esner Last seen Dr.Elsner  Nov 2nd 2017 was admitted with fever chills and drainage from the site and had an abscess drained and it was MRSA and peptostreptococcus. Her blood cultures grew MRSA. She was initially placed on vancomycin but developed a perceived reaction with some burning at the site on the administration through a peripheral IV. It was decided to change her to daptomycin. She did have a transthoracic echocardiogram which was negative for endocarditis. She was at a facility receiving daptomycin when she was admitted with this pneumonia. She had multifocal infiltrates. She also had cultures of her wound which grew Pseudomonas and a urinary culture with E. coli. She was initially in the intensive care unit but improved.the infiltrates in  the lung were thought to be due to daptomycin. ESR then was 85. She went back on vancomycin and after completion of IV she was on cipro and doxy for a few more weeks and eventually on Doxy as chronic suppressive therapy.     Past Medical History:  Diagnosis Date  . Anxiety   . Arthritis   . Cancer ([place])    BASAL CELL SKIN CANCERS REMOVED  . Depression   . Edema    FEET/LEGS  . GERD (gastroesophageal reflux disease)   . Heart murmur   . History of hiatal hernia   . Hypertension   . Lymphedema   . MRSA (methicillin resistant staph aureus) culture positive    H/O    Past Surgical History:  Procedure Laterality Date  . ABDOMINAL HYSTERECTOMY    . APPENDECTOMY    . BACK SURGERY    . CATARACT EXTRACTION W/PHACO Left 03/28/2018   Procedure: CATARACT EXTRACTION PHACO AND INTRAOCULAR LENS PLACEMENT (IOC);  Surgeon: Eulogio Bear, MD;  Location: [facility];  Service: Ophthalmology;  Laterality: Left;  [place] 00:28.2 AP% 7.9 CDE 2.21 Fluid pack lot # [id number]  . CATARACT EXTRACTION W/PHACO Right 05/16/2018   Procedure: CATARACT EXTRACTION PHACO AND INTRAOCULAR LENS PLACEMENT (IOC);  Surgeon: Eulogio Bear, MD;  Location: [facility] ORS;  Service: Ophthalmology;  Laterality: Right;  [place] 00:26.2 AP% 7.6 CDE 2.01 Fluid pack lot # [id number]  . CHOLECYSTECTOMY    . COLONOSCOPY    . FRACTURE SURGERY     BONE ON SIDE OF RIGHT FOOT  .  HERNIA REPAIR     UMBILICAL X 2  . JOINT REPLACEMENT Bilateral    knees  . KIDNEY SURGERY    . LUMBAR WOUND DEBRIDEMENT N/A 08/03/2016   Procedure: IRRIGATION AND DEBRIDEMENT LUMBAR WOUND;  Surgeon: Eustace Moore, MD;  Location: [facility];  Service: Neurosurgery;  Laterality: N/A;  . OVARIAN CYST SURGERY    . REPAIR OF LEFT URETER Left 40 YRS AGO  . ROTATOR CUFF REPAIR  2014  . TONSILLECTOMY    . TUBAL LIGATION       SH Lives on her own Non smoker No alcohol   Family History  Problem Relation Age of Onset  . Hypertension Mother   . Stroke Mother   .  Alzheimer's disease Mother   . Cancer Father        brain  . Hypertension Father   . Stroke Father    Allergies  Allergen Reactions  . Tape Itching, Dermatitis and Rash  . 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 Band-Aid Plus Antibiotic [Bacitracin-Polymyxin B] Other (See Comments)    Unknown  . Iodinated Diagnostic Agents Itching  . Morphine Sulfate Itching  . Vancomycin Rash   Current Outpatient Medications  Medication Sig Dispense Refill  . acetaminophen (ACETAMINOPHEN 8 HOUR) 650 MG CR tablet Take 650 mg by mouth every 8 (eight) hours as needed for pain.     . Ascorbic Acid (VITAMIN C) 1000 MG tablet Take 1,000 mg by mouth daily.    Marland Kitchen aspirin EC 81 MG tablet Take 81 mg by mouth daily.    . Calcium Carb-Cholecalciferol (CALCIUM 600+D) 600-800 MG-UNIT TABS Take 1 tablet by mouth daily.     . calcium carbonate (OS-CAL - DOSED IN MG OF ELEMENTAL CALCIUM) 1250 (500 Ca) MG tablet Take by mouth.    . cetirizine (ZYRTEC) 10 MG tablet Take 10 mg by mouth at bedtime.     . Cranberry 425 MG CAPS Take by mouth.    . diclofenac sodium (VOLTAREN) 1 % GEL Apply 1 application topically 4 (four) times daily as needed (for pain).     Marland Kitchen doxycycline (VIBRAMYCIN) 100 MG capsule Take 100 mg by mouth at bedtime.     . hydrochlorothiazide (HYDRODIURIL) 25 MG tablet Take 1 tablet (25 mg total) by mouth daily. 90 tablet 4  . loperamide (IMODIUM A-D) 2 MG tablet Take 2-4 mg by mouth 4 (four) times daily as needed for diarrhea or loose stools.    Marland Kitchen LORazepam (ATIVAN) 0.5 MG tablet Take 1 tablet (0.5 mg total) by mouth daily. Take only for bad days 135 tablet 1  . losartan (COZAAR) 100 MG tablet Take 1 tablet (100 mg total) by mouth daily. 90 tablet 4  . metoprolol succinate (TOPROL-XL) 50 MG 24 hr tablet Take 1 tablet (50 mg total) by mouth daily. 90 tablet 4  . Multiple Vitamin (MULTIVITAMIN) tablet Take 1 tablet by mouth daily.    . Omega-3 Fatty Acids (OMEGA-3  FISH OIL PO) Take 2 capsules by mouth daily.     Marland Kitchen omeprazole (PRILOSEC) 40 MG capsule Take 1 capsule (40 mg total) by mouth 2 (two) times daily. 180 capsule 4  . Probiotic Product ([employer]S DAILY PROBIOTIC PO) Take 1 capsule by mouth daily.     . traMADol (ULTRAM) 50 MG tablet Take 1 tablet (50 mg total) by mouth 2 (two) times daily as needed. (Patient taking differently: Take 50 mg by mouth 2 (two) times daily. )  180 tablet 1  . traZODone (DESYREL) 50 MG tablet Take 1-2 tablets (50-100 mg total) by mouth at bedtime. (Patient taking differently: Take 100 mg by mouth at bedtime. ) 180 tablet 4   No current facility-administered medications for this visit.      Abtx:  Anti-infectives (From admission, onward)   None      REVIEW OF SYSTEMS:  Const: negative fever, negative chills, negative weight loss Eyes: negative diplopia or visual changes, negative eye pain ENT: negative coryza, negative sore throat Resp: negative cough, hemoptysis, dyspnea Cards: negative for chest pain, palpitations, lower extremity edema GU: negative for frequency, dysuria and hematuria GI: Negative for abdominal pain, has some  diarrhea, bleeding, constipation Skin: negative for rash and pruritus Heme: negative for easy bruising and gum/nose bleeding MS: has , back pain and muscle weakness, swelling legs Neurolo:negative for headaches, dizziness, vertigo, memory problems  Psych: negative for feelings of anxiety, depression  Endocrine: no polyuria or polydipsia Allergy/Immunology- medication allergies noted?  Objective:  VITALS: 145/89, pulse 71, Temp 97.8 PHYSICAL EXAM:  General: Alert, cooperative, no distress, appears stated age. obese Head: Normocephalic, without obvious abnormality, atraumatic. Eyes: Conjunctivae clear, anicteric sclerae. Pupils are equal ENT Nares normal. No drainage or sinus tenderness. Lips, mucosa, and tongue normal. No Thrush Neck: Supple, symmetrical, no adenopathy, thyroid: non  tender no carotid bruit and no JVD. Back: not examined Lungs: Clear to auscultation bilaterally. No Wheezing or Rhonchi. No rales. Heart: Regular rate and rhythm, no murmur, rub or gallop. Abdomen:did not examine Extremities:lymph edema legs-has compression bandages Skin: No rashes or lesions. Or bruising Lymph: Cervical, supraclavicular normal. Neurologic: Grossly non-focal Pertinent Labs Lab Results CBC    Component Value Date/Time   WBC 8.6 03/20/2018 1505   WBC 8.7 08/22/2016 0543   RBC 4.50 03/20/2018 1505   RBC 3.15 (L) 08/22/2016 0543   HGB 12.8 03/20/2018 1505   HCT 40.2 03/20/2018 1505   PLT 189 03/20/2018 1505   MCV 89 03/20/2018 1505   MCV 89 10/23/2012 1520   MCH 28.4 03/20/2018 1505   MCH 28.4 08/22/2016 0543   MCHC 31.8 03/20/2018 1505   MCHC 32.4 08/22/2016 0543   RDW 13.2 03/20/2018 1505   RDW 13.4 10/23/2012 1520   LYMPHSABS 3.6 (H) 03/20/2018 1505   MONOABS 1.0 (H) 08/19/2016 0055   EOSABS 0.3 03/20/2018 1505   BASOSABS 0.1 03/20/2018 1505    CMP Latest Ref Rng & Units 03/20/2018 09/17/2017 03/13/2017  Glucose 65 - 99 mg/dL 101(H) 98 111(H)  BUN 8 - 27 mg/dL 25 27 23   Creatinine 0.57 - 1.00 mg/dL 0.98 0.72 0.96  Sodium 134 - 144 mmol/L 142 145(H) 144  Potassium 3.5 - 5.2 mmol/L 4.5 3.8 4.0  Chloride 96 - 106 mmol/L 100 100 101  CO2 20 - 29 mmol/L 27 26 28   Calcium 8.7 - 10.3 mg/dL 9.3 9.6 10.1  Total Protein 6.0 - 8.5 g/dL 6.4 - 6.6  Total Bilirubin 0.0 - 1.2 mg/dL 0.3 - 0.4  Alkaline Phos 39 - 117 IU/L 47 - 55  AST 0 - 40 IU/L 18 - 17  ALT 0 - 32 IU/L 15 - 14      Microbiology: No results found for this or any previous visit (from the past 240 hour(s)). 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 Cage subsidence with no solid posterior or interbody arthrodesis. Heterotopic bone within the subarticular zone at L4-5 on the LEFTwith additional extraforaminal ossification/spurring extending to the LEFT. LEFT L5 and LEFT L4 nerve root  impingement are possible.  Adjacent segment disease at L3-4, central protrusion with posterior element hypertrophy and 2 mm retrolisthesis. Mild stenosis with RIGHT greater than LEFT L4, possibly RIGHT L3 nerve root Impingement.?  Impression/Recommendation 72 y.o. female with a history of lumbar spine surgery in 7981, complicated by MRSA infection , treated with IV vanco/Dapto/vanco along with surgical debridement and is now on suppressive doxy therapy is here to engage in care with me as her previous ID provider Dr.Fitzgerald is not in the country now.? ? 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 Has been treated and now on suppressive doxycycline 167m once a day indefintely because of hardware and also she says on stopping antibiotic the pain returns. The lumber spine surgery  Has  healed completely and the bone has fused as per Dr.Elsners note from her last visit  If Dr.Crissman ( PCP) can refill the prescription fo Doxycycline she will not need to see me unless there is worsening of her symptoms- ESR has been normal for the past year and there is no need to check it again- The other labs like CBC and CMP along with other labs as per PCP  Lymph edema legs- getting treatment  B/l TKA-  ___________________________________________________ Discussed with patient in great detail

## 2018-09-02 ENCOUNTER — Ambulatory Visit: Payer: Medicare Other | Admitting: Infectious Diseases

## 2018-09-03 ENCOUNTER — Ambulatory Visit: Payer: Medicare Other | Admitting: Infectious Diseases

## 2018-09-09 ENCOUNTER — Encounter: Payer: Self-pay | Admitting: Family Medicine

## 2018-09-19 ENCOUNTER — Other Ambulatory Visit: Payer: Self-pay | Admitting: Family Medicine

## 2018-09-19 ENCOUNTER — Ambulatory Visit: Payer: No Typology Code available for payment source | Admitting: Family Medicine

## 2018-09-19 DIAGNOSIS — F419 Anxiety disorder, unspecified: Secondary | ICD-10-CM

## 2018-09-19 NOTE — Telephone Encounter (Signed)
Requested medication (s) are due for refill today: Yes  Requested medication (s) are on the active medication list: Yes  Last refill:  03/20/18  Future visit scheduled: Yes  Notes to clinic:  Unable to refill per protocol     Requested Prescriptions  Pending Prescriptions Disp Refills   LORazepam (ATIVAN) 0.5 MG tablet [Pharmacy Med Name: LORAZEPAM 0.5 MG TABLET] 90 tablet     Sig: Take 1 tablet (0.5 mg total) by mouth daily. Take only for bad days     Not Delegated - Psychiatry:  Anxiolytics/Hypnotics Failed - 09/19/2018  5:35 PM      Failed - This refill cannot be delegated      Failed - Urine Drug Screen completed in last 360 days.      Failed - Valid encounter within last 6 months    Recent Outpatient Visits          6 months ago Essential hypertension   [facility] Crissman, Jeannette How, MD   6 months ago Lower abdominal pain   [facility] Carles Collet M, [place]   9 months ago Essential hypertension   [place], Jeannette How, MD   1 year ago Essential hypertension   [facility] Crissman, Jeannette How, MD   1 year ago Annual physical exam   [place], [place]      Future Appointments            In 1 month Crissman, Jeannette How, MD [place], [place]   In 6 months  [employer], [place]

## 2018-09-23 ENCOUNTER — Other Ambulatory Visit: Payer: Self-pay | Admitting: Family Medicine

## 2018-09-23 DIAGNOSIS — F419 Anxiety disorder, unspecified: Secondary | ICD-10-CM

## 2018-09-23 NOTE — Telephone Encounter (Signed)
Called pharmacy. Prescription was not received , can we please resend?

## 2018-09-23 NOTE — Telephone Encounter (Signed)
Pharmacy is requesting a new Rx. Please review for non- delegated Rx.

## 2018-09-23 NOTE — Telephone Encounter (Signed)
Please confirm that the pharmacy did not receive script for lorazepam sent 12/20 and if they didn't I will re-send

## 2018-10-09 ENCOUNTER — Ambulatory Visit: Payer: Medicare Other | Admitting: Family Medicine

## 2018-10-13 ENCOUNTER — Other Ambulatory Visit: Payer: Self-pay | Admitting: Family Medicine

## 2018-10-14 NOTE — Telephone Encounter (Signed)
Courtesy refill until appointment 10/24/18

## 2018-10-24 ENCOUNTER — Other Ambulatory Visit: Payer: Self-pay

## 2018-10-24 ENCOUNTER — Ambulatory Visit (INDEPENDENT_AMBULATORY_CARE_PROVIDER_SITE_OTHER): Payer: Medicare Other | Admitting: Family Medicine

## 2018-10-24 ENCOUNTER — Encounter: Payer: Self-pay | Admitting: Family Medicine

## 2018-10-24 VITALS — BP 140/75 | HR 60 | Temp 97.5°F | Ht 61.0 in | Wt 285.0 lb

## 2018-10-24 DIAGNOSIS — Z6841 Body Mass Index (BMI) 40.0 and over, adult: Secondary | ICD-10-CM | POA: Diagnosis not present

## 2018-10-24 DIAGNOSIS — M4626 Osteomyelitis of vertebra, lumbar region: Secondary | ICD-10-CM | POA: Insufficient documentation

## 2018-10-24 DIAGNOSIS — G2581 Restless legs syndrome: Secondary | ICD-10-CM | POA: Insufficient documentation

## 2018-10-24 DIAGNOSIS — I10 Essential (primary) hypertension: Secondary | ICD-10-CM | POA: Diagnosis not present

## 2018-10-24 DIAGNOSIS — M545 Low back pain, unspecified: Secondary | ICD-10-CM

## 2018-10-24 DIAGNOSIS — F419 Anxiety disorder, unspecified: Secondary | ICD-10-CM | POA: Diagnosis not present

## 2018-10-24 DIAGNOSIS — E7800 Pure hypercholesterolemia, unspecified: Secondary | ICD-10-CM

## 2018-10-24 DIAGNOSIS — R600 Localized edema: Secondary | ICD-10-CM

## 2018-10-24 LAB — LP+ALT+AST PICCOLO, WAIVED
ALT (SGPT) Piccolo, Waived: 21 U/L (ref 10–47)
AST (SGOT) Piccolo, Waived: 28 U/L (ref 11–38)
Chol/HDL Ratio Piccolo,Waive: 3.1 mg/dL
Cholesterol Piccolo, Waived: 174 mg/dL (ref <200)
HDL Chol Piccolo, Waived: 57 mg/dL — ABNORMAL LOW (ref >59)
LDL Chol Calc Piccolo Waived: 74 mg/dL (ref <100)
Triglycerides Piccolo,Waived: 215 mg/dL — ABNORMAL HIGH (ref <150)
VLDL Chol Calc Piccolo,Waive: 43 mg/dL — ABNORMAL HIGH (ref <30)

## 2018-10-24 MED ORDER — OMEPRAZOLE 40 MG PO CPDR: 40.0000 mg | DELAYED_RELEASE_CAPSULE | Freq: Two times a day (BID) | ORAL | 3 refills | Status: DC

## 2018-10-24 MED ORDER — FLUTICASONE PROPIONATE 50 MCG/ACT NA SUSP: 2.0000 | Freq: Every day | NASAL | 6 refills | Status: DC

## 2018-10-24 MED ORDER — LORATADINE 10 MG PO TABS: 10.0000 mg | ORAL_TABLET | Freq: Every day | ORAL | 3 refills | Status: DC

## 2018-10-24 MED ORDER — TRAMADOL HCL 50 MG PO TABS: 50.0000 mg | ORAL_TABLET | Freq: Two times a day (BID) | ORAL | 1 refills | Status: DC | PRN

## 2018-10-24 MED ORDER — LOSARTAN POTASSIUM 100 MG PO TABS: 100.0000 mg | ORAL_TABLET | Freq: Every day | ORAL | 3 refills | Status: DC

## 2018-10-24 MED ORDER — HYDROCHLOROTHIAZIDE 25 MG PO TABS: 25.0000 mg | ORAL_TABLET | Freq: Every day | ORAL | 3 refills | Status: DC

## 2018-10-24 MED ORDER — GABAPENTIN 300 MG PO CAPS: 600.0000 mg | ORAL_CAPSULE | Freq: Two times a day (BID) | ORAL | 5 refills | Status: DC

## 2018-10-24 MED ORDER — DOXYCYCLINE HYCLATE 100 MG PO CAPS: 100.0000 mg | ORAL_CAPSULE | Freq: Every day | ORAL | 3 refills | Status: DC

## 2018-10-24 MED ORDER — LORAZEPAM 0.5 MG PO TABS: 0.5000 mg | ORAL_TABLET | Freq: Every day | ORAL | 1 refills | Status: DC

## 2018-10-24 MED ORDER — METOPROLOL SUCCINATE ER 50 MG PO TB24: 50.0000 mg | ORAL_TABLET | Freq: Every day | ORAL | 3 refills | Status: DC

## 2018-10-24 MED ORDER — TRAZODONE HCL 50 MG PO TABS: 50.0000 mg | ORAL_TABLET | Freq: Every day | ORAL | 1 refills | Status: DC

## 2018-10-24 NOTE — Assessment & Plan Note (Signed)
Patient faithfully uses compression and lymphedema pump

## 2018-10-24 NOTE — Assessment & Plan Note (Signed)
Discussed gabapentin use and treatment will increase by 1 a week as tolerated

## 2018-10-24 NOTE — Assessment & Plan Note (Signed)
The current medical regimen is effective;  continue present plan and medications.  

## 2018-10-24 NOTE — Progress Notes (Signed)
BP 140/75   Pulse 60   Temp (!) 97.5 F (36.4 C) (Oral)   Ht _0  (1.549 m)   Wt 285 lb (129.3 kg)   SpO2 95%   BMI 53.85 kg/m    Subjective:    Patient ID: Tracy Espinoza, female    DOB: 1945-10-14, 73 y.o.   MRN: [id number]  HPI: Tracy Espinoza is a 73 y.o. female  Chief Complaint  Patient presents with  . Hypertension    f/u pt states woul like to discuss about omeprazole  . Hyperlipidemia  Patient all in all doing okay chronic back infection stable has had 3 different doctor says she needs to stay on doxycycline 100 mg once a day.  Patient tolerates this dose without problems or issues.  Has tried stopping and tracking sed rate and CRP with out doxycycline CRP sed rate increased dramatically and went back to single digits  on medication. Blood pressure doing well no complaints from medications. Have some allergies is tried Claritin and Flonase which helped once a prescription for that. Bothered by restless legs at night wanted to have some medication discussed gabapentin.  Relevant past medical, surgical, family and social history reviewed and updated as indicated. Interim medical history since our last visit reviewed. Allergies and medications reviewed and updated.  Review of Systems  Constitutional: Negative.        Using a walker  Respiratory: Negative.   Cardiovascular: Negative.     Per HPI unless specifically indicated above     Objective:    BP 140/75   Pulse 60   Temp (!) 97.5 F (36.4 C) (Oral)   Ht _1  (1.549 m)   Wt 285 lb (129.3 kg)   SpO2 95%   BMI 53.85 kg/m   Wt Readings from Last 3 Encounters:  10/24/18 285 lb (129.3 kg)  07/24/18 259 lb (117.5 kg)  06/28/18 284 lb (128.8 kg)    Physical Exam Constitutional:      Appearance: She is well-developed.  HENT:     Head: Normocephalic and atraumatic.  Eyes:     Conjunctiva/sclera: Conjunctivae normal.  Neck:     Musculoskeletal: Normal range of motion.  Cardiovascular:     Rate and  Rhythm: Normal rate and regular rhythm.     Heart sounds: Normal heart sounds.  Pulmonary:     Effort: Pulmonary effort is normal.     Breath sounds: Normal breath sounds.  Musculoskeletal: Normal range of motion.  Skin:    Findings: No erythema.  Neurological:     Mental Status: She is alert and oriented to person, place, and time.  Psychiatric:        Behavior: Behavior normal.        Thought Content: Thought content normal.        Judgment: Judgment normal.     Results for orders placed or performed in visit on 03/20/18  Microscopic Examination  Result Value Ref Range   WBC, UA >30 (A) 0 - 5 /hpf   RBC, UA 0-2 0 - 2 /hpf   Epithelial Cells (non renal) 0-10 0 - 10 /hpf   Renal Epithel, UA 0-10 (A) None seen /hpf   Bacteria, UA Few None seen/Few  CBC with Differential/Platelet  Result Value Ref Range   WBC 8.6 3.4 - 10.8 x10E3/uL   RBC 4.50 3.77 - 5.28 x10E6/uL   Hemoglobin 12.8 11.1 - 15.9 g/dL   Hematocrit 40.2 34.0 - 46.6 %   MCV  89 79 - 97 fL   MCH 28.4 26.6 - 33.0 pg   MCHC 31.8 31.5 - 35.7 g/dL   RDW 13.2 12.3 - 15.4 %   Platelets 189 150 - 450 x10E3/uL   Neutrophils 47 Not Estab. %   Lymphs 42 Not Estab. %   Monocytes 7 Not Estab. %   Eos 3 Not Estab. %   Basos 1 Not Estab. %   Neutrophils Absolute 3.9 1.4 - 7.0 x10E3/uL   Lymphocytes Absolute 3.6 (H) 0.7 - 3.1 x10E3/uL   Monocytes Absolute 0.6 0.1 - 0.9 x10E3/uL   EOS (ABSOLUTE) 0.3 0.0 - 0.4 x10E3/uL   Basophils Absolute 0.1 0.0 - 0.2 x10E3/uL   Immature Granulocytes 0 Not Estab. %   Immature Grans (Abs) 0.0 0.0 - 0.1 x10E3/uL  Comprehensive metabolic panel  Result Value Ref Range   Glucose 101 (H) 65 - 99 mg/dL   BUN 25 8 - 27 mg/dL   Creatinine, Ser 0.98 0.57 - 1.00 mg/dL   GFR calc non Af Amer 57 (L) >59 mL/min/1.73   GFR calc Af Amer 66 >59 mL/min/1.73   BUN/Creatinine Ratio 26 12 - 28   Sodium 142 134 - 144 mmol/L   Potassium 4.5 3.5 - 5.2 mmol/L   Chloride 100 96 - 106 mmol/L   CO2 27 20 - 29  mmol/L   Calcium 9.3 8.7 - 10.3 mg/dL   Total Protein 6.4 6.0 - 8.5 g/dL   Albumin 4.1 3.5 - 4.8 g/dL   Globulin, Total 2.3 1.5 - 4.5 g/dL   Albumin/Globulin Ratio 1.8 1.2 - 2.2   Bilirubin Total 0.3 0.0 - 1.2 mg/dL   Alkaline Phosphatase 47 39 - 117 IU/L   AST 18 0 - 40 IU/L   ALT 15 0 - 32 IU/L  Lipid panel  Result Value Ref Range   Cholesterol, Total 159 100 - 199 mg/dL   Triglycerides 232 (H) 0 - 149 mg/dL   HDL 44 >39 mg/dL   VLDL Cholesterol Cal 46 (H) 5 - 40 mg/dL   LDL Calculated 69 0 - 99 mg/dL   Chol/HDL Ratio 3.6 0.0 - 4.4 ratio  TSH  Result Value Ref Range   TSH 2.310 0.450 - 4.500 uIU/mL  Urinalysis, Routine w reflex microscopic  Result Value Ref Range   Specific Gravity, UA 1.020 1.005 - 1.030   pH, UA 6.0 5.0 - 7.5   Color, UA Orange Yellow   Appearance Ur Turbid (A) Clear   Leukocytes, UA 2+ (A) Negative   Protein, UA Negative Negative/Trace   Glucose, UA Negative Negative   Ketones, UA Negative Negative   RBC, UA Trace (A) Negative   Bilirubin, UA Negative Negative   Urobilinogen, Ur 0.2 0.2 - 1.0 mg/dL   Nitrite, UA Negative Negative   Microscopic Examination See below:       Assessment & Plan:   Problem List Items Addressed This Visit      Cardiovascular and Mediastinum   Hypertension - Primary    The current medical regimen is effective;  continue present plan and medications.       Relevant Medications   metoprolol succinate (TOPROL-XL) 50 MG 24 hr tablet   hydrochlorothiazide (HYDRODIURIL) 25 MG tablet   losartan (COZAAR) 100 MG tablet   Other Relevant Orders   Basic metabolic panel   LP+ALT+AST Piccolo, Waived     Musculoskeletal and Integument   Osteomyelitis of lumbar vertebra (HCC)    Continuous use of doxycycline 100 mg daily  Relevant Orders   Sed Rate (ESR)   Comprehensive metabolic panel     Other   Hypercholesteremia   Relevant Medications   metoprolol succinate (TOPROL-XL) 50 MG 24 hr tablet   hydrochlorothiazide  (HYDRODIURIL) 25 MG tablet   losartan (COZAAR) 100 MG tablet   Other Relevant Orders   Basic metabolic panel   LP+ALT+AST Piccolo, Waived   BMI 45.0-49.9, adult (HCC)    Discussed diet weight loss      Anxiety   Relevant Medications   traZODone (DESYREL) 50 MG tablet   LORazepam (ATIVAN) 0.5 MG tablet   Bilateral leg edema    Patient faithfully uses compression and lymphedema pump      Back pain at L4-L5 level   Relevant Medications   traMADol (ULTRAM) 50 MG tablet   Restless legs    Discussed gabapentin use and treatment will increase by 1 a week as tolerated          Follow up plan: Return in about 6 months (around 04/24/2019) for Physical Exam.

## 2018-10-24 NOTE — Assessment & Plan Note (Signed)
Discussed diet weight loss 

## 2018-10-24 NOTE — Assessment & Plan Note (Signed)
Continuous use of doxycycline 100 mg daily

## 2018-10-25 LAB — COMPREHENSIVE METABOLIC PANEL
ALK PHOS: 43 IU/L (ref 39–117)
ALT: 17 IU/L (ref 0–32)
AST: 18 IU/L (ref 0–40)
Albumin/Globulin Ratio: 1.5 (ref 1.2–2.2)
Albumin: 4.1 g/dL (ref 3.7–4.7)
BUN/Creatinine Ratio: 35 — ABNORMAL HIGH (ref 12–28)
BUN: 26 mg/dL (ref 8–27)
Bilirubin Total: 0.5 mg/dL (ref 0.0–1.2)
CO2: 26 mmol/L (ref 20–29)
Calcium: 9.9 mg/dL (ref 8.7–10.3)
Chloride: 99 mmol/L (ref 96–106)
Creatinine, Ser: 0.74 mg/dL (ref 0.57–1.00)
GFR calc Af Amer: 93 mL/min/{1.73_m2} (ref >59)
GFR calc non Af Amer: 81 mL/min/{1.73_m2} (ref >59)
GLUCOSE: 98 mg/dL (ref 65–99)
Globulin, Total: 2.8 g/dL (ref 1.5–4.5)
Potassium: 3.6 mmol/L (ref 3.5–5.2)
Sodium: 145 mmol/L — ABNORMAL HIGH (ref 134–144)
Total Protein: 6.9 g/dL (ref 6.0–8.5)

## 2018-10-25 LAB — SEDIMENTATION RATE: Sed Rate: 9 mm/hr (ref 0–40)

## 2018-10-28 ENCOUNTER — Encounter: Payer: Self-pay | Admitting: Family Medicine

## 2018-11-24 ENCOUNTER — Other Ambulatory Visit: Payer: Self-pay | Admitting: Family Medicine

## 2018-12-06 ENCOUNTER — Other Ambulatory Visit: Payer: Self-pay | Admitting: Family Medicine

## 2018-12-12 ENCOUNTER — Other Ambulatory Visit: Payer: Self-pay | Admitting: Family Medicine

## 2018-12-12 NOTE — Telephone Encounter (Signed)
Patient returned call and says she didn't ask for Trazodone, but wanted the refill of Lorazepam. I advised Lorazepam was sent to [facility] on 10/24/18 #90/1 refill, advised if she's taking it daily as prescribed she should have enough supply on hand until 01/23/19, then there is 1 refill at the pharmacy, she verbalized understanding. I asked her if she uses [facility] and [facility], she verbalized yes. I asked if she received Trazodone from [facility], she looked in the bag received and says it's in the bag. She says she is ok on her medications right now.

## 2018-12-12 NOTE — Telephone Encounter (Signed)
Patient called, left VM to return call to the office to discuss refill request. Last refill sent on 11/25/18 #90 (30 day supply if using 2 pills/night) 1 refill sent to [facility]. Is the patient still using [facility] or now using [facility]? She should have enough supply from [facility] until 12/24/18, if they sent it to her.

## 2019-01-23 ENCOUNTER — Other Ambulatory Visit: Payer: Self-pay | Admitting: Family Medicine

## 2019-02-10 ENCOUNTER — Other Ambulatory Visit: Payer: Self-pay

## 2019-02-10 ENCOUNTER — Ambulatory Visit (INDEPENDENT_AMBULATORY_CARE_PROVIDER_SITE_OTHER): Payer: Medicare Other | Admitting: Family Medicine

## 2019-02-10 ENCOUNTER — Encounter: Payer: Self-pay | Admitting: Family Medicine

## 2019-02-10 DIAGNOSIS — N3000 Acute cystitis without hematuria: Secondary | ICD-10-CM

## 2019-02-10 MED ORDER — CIPROFLOXACIN HCL 250 MG PO TABS: 250.0000 mg | ORAL_TABLET | Freq: Two times a day (BID) | ORAL | 0 refills | Status: DC

## 2019-02-10 NOTE — Progress Notes (Signed)
There were no vitals taken for this visit.   Subjective:    Patient ID: Tracy Espinoza, female    DOB: Dec 26, 1945, 73 y.o.   MRN: [id number]  HPI: Tracy Espinoza is a 73 y.o. female  UTI sx   Telemedicine using audio/video telecommunications for a synchronous communication visit. Today's visit due to COVID-19 isolation precautions I connected with and verified that I am speaking with the correct person using two identifiers.   I discussed the limitations, risks, security and privacy concerns of performing an evaluation and management service by telecommunication and the availability of in person appointments. I also discussed with the patient that there may be a patient responsible charge related to this service. The patient expressed understanding and agreed to proceed. The patient's location is home. I am at home.   Patient follow-up having UTI symptoms had multiple UTIs in the past only responsive to Cipro.  Discussed other conditions and concerns patient with none staying inside the house and doing social distancing without problems.  Relevant past medical, surgical, family and social history reviewed and updated as indicated. Interim medical history since our last visit reviewed. Allergies and medications reviewed and updated.  Review of Systems  Constitutional: Negative.   Respiratory: Negative.   Cardiovascular: Negative.     Per HPI unless specifically indicated above     Objective:    There were no vitals taken for this visit.  Wt Readings from Last 3 Encounters:  10/24/18 285 lb (129.3 kg)  07/24/18 259 lb (117.5 kg)  06/28/18 284 lb (128.8 kg)    Physical Exam  Results for orders placed or performed in visit on 10/24/18  LP+ALT+AST Piccolo, Waived  Result Value Ref Range   ALT (SGPT) Piccolo, Waived 21 10 - 47 U/L   AST (SGOT) Piccolo, Waived 28 11 - 38 U/L   Cholesterol Piccolo, Waived 174 <200 mg/dL   HDL Chol Piccolo, Waived 57 (L) >59 mg/dL   Triglycerides Piccolo,Waived 215 (H) <150 mg/dL   Chol/HDL Ratio Piccolo,Waive 3.1 mg/dL   LDL Chol Calc Piccolo Waived 74 <100 mg/dL   VLDL Chol Calc Piccolo,Waive 43 (H) <30 mg/dL  Sed Rate (ESR)  Result Value Ref Range   Sed Rate 9 0 - 40 mm/hr  Comprehensive metabolic panel  Result Value Ref Range   Glucose 98 65 - 99 mg/dL   BUN 26 8 - 27 mg/dL   Creatinine, Ser 0.74 0.57 - 1.00 mg/dL   GFR calc non Af Amer 81 >59 mL/min/1.73   GFR calc Af Amer 93 >59 mL/min/1.73   BUN/Creatinine Ratio 35 (H) 12 - 28   Sodium 145 (H) 134 - 144 mmol/L   Potassium 3.6 3.5 - 5.2 mmol/L   Chloride 99 96 - 106 mmol/L   CO2 26 20 - 29 mmol/L   Calcium 9.9 8.7 - 10.3 mg/dL   Total Protein 6.9 6.0 - 8.5 g/dL   Albumin 4.1 3.7 - 4.7 g/dL   Globulin, Total 2.8 1.5 - 4.5 g/dL   Albumin/Globulin Ratio 1.5 1.2 - 2.2   Bilirubin Total 0.5 0.0 - 1.2 mg/dL   Alkaline Phosphatase 43 39 - 117 IU/L   AST 18 0 - 40 IU/L   ALT 17 0 - 32 IU/L      Assessment & Plan:   Problem List Items Addressed This Visit    None    Visit Diagnoses    Acute cystitis without hematuria    -  Primary  Discussed UTI treatment and concerns about using Cipro patient states she is received bonded only to Cipro in the past understands its lower dose shorter-term and is willing to accept risks.  I discussed the assessment and treatment plan with the patient. The patient was provided an opportunity to ask questions and all were answered. The patient agreed with the plan and demonstrated an understanding of the instructions.   The patient was advised to call back or seek an in-person evaluation if the symptoms worsen or if the condition fails to improve as anticipated.   I provided 21+ minutes of time during this encounter. Follow up plan: Return if symptoms worsen or fail to improve.

## 2019-02-17 ENCOUNTER — Other Ambulatory Visit: Payer: Self-pay | Admitting: Family Medicine

## 2019-02-17 NOTE — Telephone Encounter (Signed)
Requested medication (s) are due for refill today: Yes  Requested medication (s) are on the active medication list: Yes  Last refill:  02/10/19  Future visit scheduled: Yes  Notes to clinic:  Unable to refill, not assigned to a protocol     Requested Prescriptions  Pending Prescriptions Disp Refills   ciprofloxacin (CIPRO) 250 MG tablet [Pharmacy Med Name: CIPROFLOXACIN HCL 250 MG TAB] 6 tablet 0    Sig: TAKE 1 TABLET BY MOUTH TWICE A DAY     Off-Protocol Failed - 02/17/2019  1:38 PM      Failed - Medication not assigned to a protocol, review manually.      Passed - Valid encounter within last 12 months    Recent Outpatient Visits          1 week ago Acute cystitis without hematuria   [facility] Rama, Jeannette How, MD   3 months ago Essential hypertension   [place] Crissman, Jeannette How, MD   11 months ago Essential hypertension   [place] Crissman, Jeannette How, MD   11 months ago Lower abdominal pain   [facility] [place] Trinna Post, PA-C   1 year ago Essential hypertension   [place], Jeannette How, MD      Future Appointments            In 1 month  [place]   In 2 months Crissman, Jeannette How, MD [facility], [place]

## 2019-02-27 ENCOUNTER — Other Ambulatory Visit: Payer: Self-pay | Admitting: Family Medicine

## 2019-03-19 ENCOUNTER — Ambulatory Visit: Payer: No Typology Code available for payment source

## 2019-03-22 ENCOUNTER — Other Ambulatory Visit: Payer: Self-pay | Admitting: Family Medicine

## 2019-03-24 NOTE — Telephone Encounter (Signed)
Please advise 

## 2019-03-26 ENCOUNTER — Ambulatory Visit (INDEPENDENT_AMBULATORY_CARE_PROVIDER_SITE_OTHER): Payer: Medicare Other

## 2019-03-26 DIAGNOSIS — Z0000 Encounter for general adult medical examination without abnormal findings: Secondary | ICD-10-CM | POA: Diagnosis not present

## 2019-03-26 NOTE — Patient Instructions (Signed)
[name] , Thank you for taking time to come for your Medicare Wellness Visit. I appreciate your ongoing commitment to your health goals. Please review the following plan we discussed and let me know if I can assist you in the future.   Screening recommendations/referrals: Colonoscopy: up to date Mammogram: due, please call and schedule with the breast center Bone Density: up to date Recommended yearly ophthalmology/optometry visit for glaucoma screening and checkup Recommended yearly dental visit for hygiene and checkup  Vaccinations: Influenza vaccine: up to date Pneumococcal vaccine: up to date Tdap vaccine: up to date Shingles vaccine: up to date    Advanced directives: Please bring a copy of your health care power of attorney and living will to the office at your convenience.    Next appointment: follow up in one year for your annual wellness exam.    Preventive Care 65 Years and Older, Female Preventive care refers to lifestyle choices and visits with your health care provider that can promote health and wellness. What does preventive care include?  A yearly physical exam. This is also called an annual well check.  Dental exams once or twice a year.  Routine eye exams. Ask your health care provider how often you should have your eyes checked.  Personal lifestyle choices, including:  Daily care of your teeth and gums.  Regular physical activity.  Eating a healthy diet.  Avoiding tobacco and drug use.  Limiting alcohol use.  Practicing safe sex.  Taking low-dose aspirin every day.  Taking vitamin and mineral supplements as recommended by your health care provider. What happens during an annual well check? The services and screenings done by your health care provider during your annual well check will depend on your age, overall health, lifestyle risk factors, and family history of disease. Counseling  Your health care provider may ask you questions about your:   Alcohol use.  Tobacco use.  Drug use.  Emotional well-being.  Home and relationship well-being.  Sexual activity.  Eating habits.  History of falls.  Memory and ability to understand (cognition).  Work and work Statistician.  Reproductive health. Screening  You may have the following tests or measurements:  Height, weight, and BMI.  Blood pressure.  Lipid and cholesterol levels. These may be checked every 5 years, or more frequently if you are over 64 years old.  Skin check.  Lung cancer screening. You may have this screening every year starting at age 81 if you have a 30-pack-year history of smoking and currently smoke or have quit within the past 15 years.  Fecal occult blood test (FOBT) of the stool. You may have this test every year starting at age 34.  Flexible sigmoidoscopy or colonoscopy. You may have a sigmoidoscopy every 5 years or a colonoscopy every 10 years starting at age 30.  Hepatitis C blood test.  Hepatitis B blood test.  Sexually transmitted disease (STD) testing.  Diabetes screening. This is done by checking your blood sugar (glucose) after you have not eaten for a while (fasting). You may have this done every 1-3 years.  Bone density scan. This is done to screen for osteoporosis. You may have this done starting at age 68.  Mammogram. This may be done every 1-2 years. Talk to your health care provider about how often you should have regular mammograms. Talk with your health care provider about your test results, treatment options, and if necessary, the need for more tests. Vaccines  Your health care provider may recommend  certain vaccines, such as:  Influenza vaccine. This is recommended every year.  Tetanus, diphtheria, and acellular pertussis (Tdap, Td) vaccine. You may need a Td booster every 10 years.  Zoster vaccine. You may need this after age 60.  Pneumococcal 13-valent conjugate (PCV13) vaccine. One dose is recommended after age  59.  Pneumococcal polysaccharide (PPSV23) vaccine. One dose is recommended after age 34. Talk to your health care provider about which screenings and vaccines you need and how often you need them. This information is not intended to replace advice given to you by your health care provider. Make sure you discuss any questions you have with your health care provider. Document Released: 10/15/2015 Document Revised: 06/07/2016 Document Reviewed: 07/20/2015 Elsevier Interactive Patient Education  2017 [place] Prevention in the Home Falls can cause injuries. They can happen to people of all ages. There are many things you can do to make your home safe and to help prevent falls. What can I do on the outside of my home?  Regularly fix the edges of walkways and driveways and fix any cracks.  Remove anything that might make you trip as you walk through a door, such as a raised step or threshold.  Trim any bushes or trees on the path to your home.  Use bright outdoor lighting.  Clear any walking paths of anything that might make someone trip, such as rocks or tools.  Regularly check to see if handrails are loose or broken. Make sure that both sides of any steps have handrails.  Any raised decks and porches should have guardrails on the edges.  Have any leaves, snow, or ice cleared regularly.  Use sand or salt on walking paths during winter.  Clean up any spills in your garage right away. This includes oil or grease spills. What can I do in the bathroom?  Use night lights.  Install grab bars by the toilet and in the tub and shower. Do not use towel bars as grab bars.  Use non-skid mats or decals in the tub or shower.  If you need to sit down in the shower, use a plastic, non-slip stool.  Keep the floor dry. Clean up any water that spills on the floor as soon as it happens.  Remove soap buildup in the tub or shower regularly.  Attach bath mats securely with double-sided  non-slip rug tape.  Do not have throw rugs and other things on the floor that can make you trip. What can I do in the bedroom?  Use night lights.  Make sure that you have a light by your bed that is easy to reach.  Do not use any sheets or blankets that are too big for your bed. They should not hang down onto the floor.  Have a firm chair that has side arms. You can use this for support while you get dressed.  Do not have throw rugs and other things on the floor that can make you trip. What can I do in the kitchen?  Clean up any spills right away.  Avoid walking on wet floors.  Keep items that you use a lot in easy-to-reach places.  If you need to reach something above you, use a strong step stool that has a grab bar.  Keep electrical cords out of the way.  Do not use floor polish or wax that makes floors slippery. If you must use wax, use non-skid floor wax.  Do not have throw rugs and other  things on the floor that can make you trip. What can I do with my stairs?  Do not leave any items on the stairs.  Make sure that there are handrails on both sides of the stairs and use them. Fix handrails that are broken or loose. Make sure that handrails are as long as the stairways.  Check any carpeting to make sure that it is firmly attached to the stairs. Fix any carpet that is loose or worn.  Avoid having throw rugs at the top or bottom of the stairs. If you do have throw rugs, attach them to the floor with carpet tape.  Make sure that you have a light switch at the top of the stairs and the bottom of the stairs. If you do not have them, ask someone to add them for you. What else can I do to help prevent falls?  Wear shoes that:  Do not have high heels.  Have rubber bottoms.  Are comfortable and fit you well.  Are closed at the toe. Do not wear sandals.  If you use a stepladder:  Make sure that it is fully opened. Do not climb a closed stepladder.  Make sure that both  sides of the stepladder are locked into place.  Ask someone to hold it for you, if possible.  Clearly mark and make sure that you can see:  Any grab bars or handrails.  First and last steps.  Where the edge of each step is.  Use tools that help you move around (mobility aids) if they are needed. These include:  Canes.  Walkers.  Scooters.  Crutches.  Turn on the lights when you go into a dark area. Replace any light bulbs as soon as they burn out.  Set up your furniture so you have a clear path. Avoid moving your furniture around.  If any of your floors are uneven, fix them.  If there are any pets around you, be aware of where they are.  Review your medicines with your doctor. Some medicines can make you feel dizzy. This can increase your chance of falling. Ask your doctor what other things that you can do to help prevent falls. This information is not intended to replace advice given to you by your health care provider. Make sure you discuss any questions you have with your health care provider. Document Released: 07/15/2009 Document Revised: 02/24/2016 Document Reviewed: 10/23/2014 Elsevier Interactive Patient Education  2017 [employer].

## 2019-03-26 NOTE — Progress Notes (Signed)
Subjective:   JASHLEY YELLIN is a 73 y.o. female who presents for Medicare Annual (Subsequent) preventive examination.  This visit is being conducted via phone call  - after an attmept to do on video chat - due to the COVID-19 pandemic. This patient has given me verbal consent via phone to conduct this visit, patient states they are participating from their home address. Some vital signs may be absent or patient reported.   Patient identification: identified by name, DOB, and current address.    Review of Systems:   Cardiac Risk Factors include: Other (see comment);advanced age (>62men, >35 women);dyslipidemia;hypertension, Risk factor comments: lymphedema     Objective:     Vitals: There were no vitals taken for this visit.  There is no height or weight on file to calculate BMI.  Advanced Directives 03/26/2019 03/28/2018 04/25/2017 02/28/2017 08/22/2016 08/19/2016 08/03/2016  Does Patient Have a Medical Advance Directive? Yes Yes No Yes - Yes Yes  Type of Advance Directive Living will;Healthcare Power of Attorney Living will;Healthcare Power of Attorney - Living will;Healthcare Power of Attorney Living will [place];Living will -  Does patient want to make changes to medical advance directive? - No - Patient declined - - No - Patient declined - No - Patient declined  Copy of [place] in Chart? No - copy requested No - copy requested - No - copy requested - - -  Would patient like information on creating a medical advance directive? - - No - Patient declined - - - -    Tobacco Social History   Tobacco Use  Smoking Status Never Smoker  Smokeless Tobacco Never Used     Counseling given: Not Answered   Clinical Intake:  Pre-visit preparation completed: Yes  Pain : No/denies pain     Nutritional Risks: None Diabetes: No  How often do you need to have someone help you when you read instructions, pamphlets, or other written materials from  your doctor or pharmacy?: 1 - Never What is the last grade level you completed in school?: 1 year college  Interpreter Needed?: No  Information entered by :: Jahmere Bramel,LPN  Past Medical History:  Diagnosis Date  . Anxiety   . Arthritis   . Cancer ([place])    BASAL CELL SKIN CANCERS REMOVED  . Depression   . Edema    FEET/LEGS  . GERD (gastroesophageal reflux disease)   . Heart murmur   . History of hiatal hernia   . Hypertension   . Lymphedema   . MRSA (methicillin resistant staph aureus) culture positive    H/O   Past Surgical History:  Procedure Laterality Date  . ABDOMINAL HYSTERECTOMY    . APPENDECTOMY    . BACK SURGERY    . CATARACT EXTRACTION W/PHACO Left 03/28/2018   Procedure: CATARACT EXTRACTION PHACO AND INTRAOCULAR LENS PLACEMENT (IOC);  Surgeon: Eulogio Bear, MD;  Location: [facility];  Service: Ophthalmology;  Laterality: Left;  [place] 00:28.2 AP% 7.9 CDE 2.21 Fluid pack lot # [id number]  . CATARACT EXTRACTION W/PHACO Right 05/16/2018   Procedure: CATARACT EXTRACTION PHACO AND INTRAOCULAR LENS PLACEMENT (IOC);  Surgeon: Eulogio Bear, MD;  Location: [facility];  Service: Ophthalmology;  Laterality: Right;  [place] 00:26.2 AP% 7.6 CDE 2.01 Fluid pack lot # [id number]  . CHOLECYSTECTOMY    . COLONOSCOPY    . FRACTURE SURGERY     BONE ON SIDE OF RIGHT FOOT  . HERNIA REPAIR     UMBILICAL  X 2  . JOINT REPLACEMENT Bilateral    knees  . KIDNEY SURGERY    . LUMBAR WOUND DEBRIDEMENT N/A 08/03/2016   Procedure: IRRIGATION AND DEBRIDEMENT LUMBAR WOUND;  Surgeon: Eustace Moore, MD;  Location: [place];  Service: Neurosurgery;  Laterality: N/A;  . OVARIAN CYST SURGERY    . REPAIR OF LEFT URETER Left 40 YRS AGO  . ROTATOR CUFF REPAIR  2014  . TONSILLECTOMY    . TUBAL LIGATION     Family History  Problem Relation Age of Onset  . Hypertension Mother   . Stroke Mother   . Alzheimer's disease Mother   . Cancer Father        brain  . Hypertension Father   . Stroke Father     Social History   Socioeconomic History  . Marital status: Widowed    Spouse name: Not on file  . Number of children: Not on file  . Years of education: 25   . Highest education level: Some college, no degree  Occupational History  . Occupation: retired  Scientific laboratory technician  . Financial resource strain: Not hard at all  . Food insecurity    Worry: Never true    Inability: Never true  . Transportation needs    Medical: No    Non-medical: No  Tobacco Use  . Smoking status: Never Smoker  . Smokeless tobacco: Never Used  Substance and Sexual Activity  . Alcohol use: Yes    Alcohol/week: 1.0 standard drinks    Types: 1 Glasses of wine per week    Comment: white wine occasionally 1 glass a week   . Drug use: No  . Sexual activity: Never  Lifestyle  . Physical activity    Days per week: 0 days    Minutes per session: 0 min  . Stress: Not at all  Relationships  . Social connections    Talks on phone: More than three times a week    Gets together: More than three times a week    Attends religious service: Never    Active member of club or organization: No    Attends meetings of clubs or organizations: Never    Relationship status: Divorced  Other Topics Concern  . Not on file  Social History Narrative  . Not on file    Outpatient Encounter Medications as of 03/26/2019  Medication Sig  . acetaminophen (ACETAMINOPHEN 8 HOUR) 650 MG CR tablet Take 650 mg by mouth every 8 (eight) hours as needed for pain.   . Ascorbic Acid (VITAMIN C) 1000 MG tablet Take 1,000 mg by mouth daily.  Marland Kitchen aspirin EC 81 MG tablet Take 81 mg by mouth daily.  . Calcium Carb-Cholecalciferol (CALCIUM 600+D) 600-800 MG-UNIT TABS Take 1 tablet by mouth daily.   . calcium carbonate (OS-CAL - DOSED IN MG OF ELEMENTAL CALCIUM) 1250 (500 Ca) MG tablet Take by mouth.  . cetirizine (ZYRTEC) 10 MG tablet Take 10 mg by mouth at bedtime.   . Cranberry 425 MG CAPS Take by mouth.  . diclofenac sodium (VOLTAREN) 1 % GEL  Apply 1 application topically 4 (four) times daily as needed (for pain).   Marland Kitchen doxycycline (VIBRAMYCIN) 100 MG capsule Take 1 capsule (100 mg total) by mouth at bedtime.  . fluticasone (FLONASE) 50 MCG/ACT nasal spray Place 2 sprays into both nostrils daily.  Marland Kitchen gabapentin (NEURONTIN) 300 MG capsule TAKE 2 CAPSULES (600 MG TOTAL) BY MOUTH 2 (TWO) TIMES DAILY.  . hydrochlorothiazide (HYDRODIURIL) 25  MG tablet Take 1 tablet (25 mg total) by mouth daily.  Marland Kitchen loperamide (IMODIUM A-D) 2 MG tablet Take 2-4 mg by mouth 4 (four) times daily as needed for diarrhea or loose stools. [facility] brand  . loratadine (CLARITIN) 10 MG tablet Take 1 tablet (10 mg total) by mouth daily.  Marland Kitchen LORazepam (ATIVAN) 0.5 MG tablet Take 1 tablet (0.5 mg total) by mouth daily. Take only for bad days  . losartan (COZAAR) 100 MG tablet Take 1 tablet (100 mg total) by mouth daily.  . metoprolol succinate (TOPROL-XL) 50 MG 24 hr tablet Take 1 tablet (50 mg total) by mouth daily.  . Multiple Vitamin (MULTIVITAMIN) tablet Take 1 tablet by mouth daily.  . Omega-3 Fatty Acids (OMEGA-3 FISH OIL PO) Take 2 capsules by mouth daily.   Marland Kitchen omeprazole (PRILOSEC) 40 MG capsule Take 1 capsule (40 mg total) by mouth 2 (two) times daily.  . Probiotic Product ([facility] DAILY PROBIOTIC PO) Take 1 capsule by mouth daily.   . traMADol (ULTRAM) 50 MG tablet Take 1 tablet (50 mg total) by mouth 2 (two) times daily as needed.  . traZODone (DESYREL) 50 MG tablet TAKE 1-2 TABLETS (50-100 MG TOTAL) BY MOUTH AT BEDTIME.  . [DISCONTINUED] ciprofloxacin (CIPRO) 250 MG tablet TAKE 1 TABLET BY MOUTH TWICE A DAY (Patient not taking: Reported on 03/26/2019)   No facility-administered encounter medications on file as of 03/26/2019.     Activities of Daily Living In your present state of health, do you have any difficulty performing the following activities: 03/26/2019  Hearing? N  Vision? Y  Difficulty concentrating or making decisions? N  Walking or climbing stairs? Y   Dressing or bathing? N  Doing errands, shopping? N  Preparing Food and eating ? N  Using the Toilet? N  In the past six months, have you accidently leaked urine? Y  Comment after using her leg pump.  Do you have problems with loss of bowel control? Y  Comment occasionally- taking antidiarrheal due to continuous use of doxy  Managing your Medications? N  Managing your Finances? N  Housekeeping or managing your Housekeeping? N  Some recent data might be hidden    Patient Care Team: Guadalupe Maple, MD as PCP - General (Family Medicine) Lucilla Lame, MD as Consulting Physician (Gastroenterology) Delana Meyer, Dolores Lory, MD (Vascular Surgery) Thornton Park, MD as Referring Physician (Orthopedic Surgery) Yolonda Kida, MD as Consulting Physician (Cardiology) Kristeen Miss, MD as Consulting Physician (Neurosurgery) Leonel Ramsay, MD (Infectious Diseases)    Assessment:   This is a routine wellness examination for [facility].  Exercise Activities and Dietary recommendations Current Exercise Habits: The patient does not participate in regular exercise at present, Exercise limited by: None identified  Goals    . DIET - INCREASE WATER INTAKE     Recommend drinking at least 6-8 glasses of water a day     . Increase water intake     Recommend drinking 3-4 glasses of water a day.       Fall Risk: Fall Risk  03/26/2019 03/13/2018 12/18/2017 09/17/2017 04/25/2017  Falls in the past year? 0 No No No No  Risk for fall due to : - - - - -    FALL RISK PREVENTION PERTAINING TO THE HOME:  Any stairs in or around the home? No  ramp with handrail  If so, are there any without handrails? n/a  Home free of loose throw rugs in walkways, pet beds, electrical cords, etc? Yes  Adequate lighting in your home to reduce risk of falls? Yes   ASSISTIVE DEVICES UTILIZED TO PREVENT FALLS:  Life alert? Yes  Use of a cane, walker or w/c? Yes walker  Grab bars in the bathroom? Yes  Shower chair  or bench in shower? Yes  Elevated toilet seat or a handicapped toilet? Yes   DME ORDERS:  DME order needed?  No   TIMED UP AND GO:  Unable to perform   Depression Screen PHQ 2/9 Scores 03/26/2019 10/24/2018 03/13/2018 09/17/2017  PHQ - 2 Score 3 0 0 0  PHQ- 9 Score 7 4 - -    declined follow up with Dr.Crissman, states she has had a lot going on this past week with her daughter and feels this week will be better. Will call if needed prior to next appt 7/29   Cognitive Function     6CIT Screen 03/13/2018 02/28/2017  What Year? 0 points 0 points  What month? 0 points 0 points  What time? 0 points 0 points  Count back from 20 0 points 0 points  Months in reverse 0 points 0 points  Repeat phrase 0 points 2 points  Total Score 0 2    Immunization History  Administered Date(s) Administered  . Influenza, High Dose Seasonal PF 06/02/2017, 06/11/2018  . Influenza-Unspecified 06/08/2016  . Pneumococcal Conjugate-13 08/04/2014  . Pneumococcal-Unspecified 11/13/2007, 08/25/2013  . Td 08/04/2014  . Zoster 11/13/2007  . Zoster Recombinat (Shingrix) 11/02/2017, 03/18/2018    Qualifies for Shingles Vaccine? Yes  Zostavax completed shingrix series  Tdap: up to date   Flu Vaccine: up to date .  Pneumococcal Vaccine: up to date   Screening Tests Health Maintenance  Topic Date Due  . INFLUENZA VACCINE  05/03/2019  . MAMMOGRAM  03/25/2020  . COLONOSCOPY  06/16/2024  . TETANUS/TDAP  08/04/2024  . DEXA SCAN  Completed  . Hepatitis C Screening  Completed  . PNA vac Low Risk Adult  Completed    Cancer Screenings:  Colorectal Screening: Completed 06/16/2014. Repeat every 10 years  Mammogram: Completed 03/16/2018, patient waiting for covid-19  Bone Density: Completed 03/31/2014   Lung Cancer Screening: (Low Dose CT Chest recommended if Age 43-80 years, 30 pack-year currently smoking OR have quit w/in 15years.) does not qualify.   Additional Screening:  Hepatitis C Screening:  does qualify; Completed 03/13/2017  Vision Screening: Recommended annual ophthalmology exams for early detection of glaucoma and other disorders of the eye. Is the patient up to date with their annual eye exam?  Yes  Who is the provider or what is the name of the office in which the pt attends annual eye exams? [facility]   Dental Screening: Recommended annual dental exams for proper oral hygiene  Community Resource Referral:  CRR required this visit?  No       Plan:  I have personally reviewed and addressed the Medicare Annual Wellness questionnaire and have noted the following in the patient's chart:  A. Medical and social history B. Use of alcohol, tobacco or illicit drugs  C. Current medications and supplements D. Functional ability and status E.  Nutritional status F.  Physical activity G. Advance directives H. List of other physicians I.  Hospitalizations, surgeries, and ER visits in previous 12 months J.  [place] such as hearing and vision if needed, cognitive and depression L. Referrals and appointments   In addition, I have reviewed and discussed with patient certain preventive protocols, quality metrics, and best practice  recommendations. A written personalized care plan for preventive services as well as general preventive health recommendations were provided to patient. Nurse Health Advisor  Signed,    Higginson, Caro Hight, [place]  05/04/2335 Nurse Health Advisor   Nurse Notes: none

## 2019-04-22 ENCOUNTER — Other Ambulatory Visit: Payer: Self-pay | Admitting: Family Medicine

## 2019-04-22 NOTE — Telephone Encounter (Signed)
Patient scheduled for follow up 04/30/2019.

## 2019-04-30 ENCOUNTER — Ambulatory Visit (INDEPENDENT_AMBULATORY_CARE_PROVIDER_SITE_OTHER): Payer: Medicare Other | Admitting: Family Medicine

## 2019-04-30 ENCOUNTER — Other Ambulatory Visit: Payer: Self-pay

## 2019-04-30 ENCOUNTER — Encounter: Payer: Self-pay | Admitting: Family Medicine

## 2019-04-30 DIAGNOSIS — M545 Low back pain, unspecified: Secondary | ICD-10-CM

## 2019-04-30 DIAGNOSIS — I10 Essential (primary) hypertension: Secondary | ICD-10-CM

## 2019-04-30 DIAGNOSIS — Z7189 Other specified counseling: Secondary | ICD-10-CM | POA: Diagnosis not present

## 2019-04-30 DIAGNOSIS — F419 Anxiety disorder, unspecified: Secondary | ICD-10-CM | POA: Diagnosis not present

## 2019-04-30 DIAGNOSIS — F5101 Primary insomnia: Secondary | ICD-10-CM

## 2019-04-30 DIAGNOSIS — M4626 Osteomyelitis of vertebra, lumbar region: Secondary | ICD-10-CM | POA: Diagnosis not present

## 2019-04-30 DIAGNOSIS — E7800 Pure hypercholesterolemia, unspecified: Secondary | ICD-10-CM

## 2019-04-30 DIAGNOSIS — R600 Localized edema: Secondary | ICD-10-CM

## 2019-04-30 MED ORDER — TRAZODONE HCL 100 MG PO TABS: 100.0000 mg | ORAL_TABLET | Freq: Every day | ORAL | 4 refills | Status: DC

## 2019-04-30 MED ORDER — DOXYCYCLINE HYCLATE 100 MG PO CAPS: 100.0000 mg | ORAL_CAPSULE | Freq: Every day | ORAL | 4 refills | Status: DC

## 2019-04-30 MED ORDER — HYDROCHLOROTHIAZIDE 25 MG PO TABS: 25.0000 mg | ORAL_TABLET | Freq: Every day | ORAL | 4 refills | Status: DC

## 2019-04-30 MED ORDER — LOSARTAN POTASSIUM 100 MG PO TABS: 100.0000 mg | ORAL_TABLET | Freq: Every day | ORAL | 4 refills | Status: DC

## 2019-04-30 MED ORDER — METOPROLOL SUCCINATE ER 50 MG PO TB24: 50.0000 mg | ORAL_TABLET | Freq: Every day | ORAL | 4 refills | Status: DC

## 2019-04-30 MED ORDER — OMEPRAZOLE 40 MG PO CPDR: 40.0000 mg | DELAYED_RELEASE_CAPSULE | Freq: Every day | ORAL | 4 refills | Status: DC

## 2019-04-30 MED ORDER — TRAMADOL HCL 50 MG PO TABS: 50.0000 mg | ORAL_TABLET | Freq: Two times a day (BID) | ORAL | 1 refills | Status: DC | PRN

## 2019-04-30 MED ORDER — LORAZEPAM 0.5 MG PO TABS: 0.5000 mg | ORAL_TABLET | Freq: Every day | ORAL | 1 refills | Status: DC

## 2019-04-30 MED ORDER — GABAPENTIN 300 MG PO CAPS: 600.0000 mg | ORAL_CAPSULE | Freq: Two times a day (BID) | ORAL | 4 refills | Status: DC

## 2019-04-30 NOTE — Assessment & Plan Note (Signed)
The current medical regimen is effective;  continue present plan and medications.  

## 2019-04-30 NOTE — Assessment & Plan Note (Signed)
A voluntary discussion about advanced care planning including explanation and discussion of advanced directives was extentively discussed with the patient.  Explained about the healthcare proxy and living will was reviewed and packet with forms with expiration of how to fill them out was given.  Time spent: Encounter 16+ min individuals present: Patient 

## 2019-04-30 NOTE — Progress Notes (Signed)
There were no vitals taken for this visit.   Subjective:    Patient ID: Tracy Espinoza, female    DOB: Aug 08, 1946, 73 y.o.   MRN: [id number]  HPI: Tracy Espinoza is a 73 y.o. female  meds check Discussed with patient all in all doing well being careful with COVID-19 and limited getting out and about. Also been very diligent with lymphedema using lymph flow pump daily. Reviewed other medical issues problems refills and doing well.  With cholesterol blood pressure etc.   Relevant past medical, surgical, family and social history reviewed and updated as indicated. Interim medical history since our last visit reviewed. Allergies and medications reviewed and updated.  Review of Systems  Constitutional: Negative.   HENT: Negative.   Eyes: Negative.   Respiratory: Negative.   Cardiovascular: Negative.   Gastrointestinal: Negative.   Endocrine: Negative.   Genitourinary: Negative.   Musculoskeletal: Negative.   Skin: Negative.   Allergic/Immunologic: Negative.   Neurological: Negative.   Hematological: Negative.   Psychiatric/Behavioral: Negative.     Per HPI unless specifically indicated above     Objective:    There were no vitals taken for this visit.  Wt Readings from Last 3 Encounters:  10/24/18 285 lb (129.3 kg)  07/24/18 259 lb (117.5 kg)  06/28/18 284 lb (128.8 kg)    Physical Exam  Results for orders placed or performed in visit on 10/24/18  LP+ALT+AST Piccolo, Waived  Result Value Ref Range   ALT (SGPT) Piccolo, Waived 21 10 - 47 U/L   AST (SGOT) Piccolo, Waived 28 11 - 38 U/L   Cholesterol Piccolo, Waived 174 <200 mg/dL   HDL Chol Piccolo, Waived 57 (L) >59 mg/dL   Triglycerides Piccolo,Waived 215 (H) <150 mg/dL   Chol/HDL Ratio Piccolo,Waive 3.1 mg/dL   LDL Chol Calc Piccolo Waived 74 <100 mg/dL   VLDL Chol Calc Piccolo,Waive 43 (H) <30 mg/dL  Sed Rate (ESR)  Result Value Ref Range   Sed Rate 9 0 - 40 mm/hr  Comprehensive metabolic panel  Result  Value Ref Range   Glucose 98 65 - 99 mg/dL   BUN 26 8 - 27 mg/dL   Creatinine, Ser 0.74 0.57 - 1.00 mg/dL   GFR calc non Af Amer 81 >59 mL/min/1.73   GFR calc Af Amer 93 >59 mL/min/1.73   BUN/Creatinine Ratio 35 (H) 12 - 28   Sodium 145 (H) 134 - 144 mmol/L   Potassium 3.6 3.5 - 5.2 mmol/L   Chloride 99 96 - 106 mmol/L   CO2 26 20 - 29 mmol/L   Calcium 9.9 8.7 - 10.3 mg/dL   Total Protein 6.9 6.0 - 8.5 g/dL   Albumin 4.1 3.7 - 4.7 g/dL   Globulin, Total 2.8 1.5 - 4.5 g/dL   Albumin/Globulin Ratio 1.5 1.2 - 2.2   Bilirubin Total 0.5 0.0 - 1.2 mg/dL   Alkaline Phosphatase 43 39 - 117 IU/L   AST 18 0 - 40 IU/L   ALT 17 0 - 32 IU/L      Assessment & Plan:   Problem List Items Addressed This Visit      Cardiovascular and Mediastinum   Hypertension    The current medical regimen is effective;  continue present plan and medications.       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       Relevant Medications   losartan (COZAAR) 100 MG tablet   hydrochlorothiazide (HYDRODIURIL) 25 MG tablet   metoprolol succinate (TOPROL-XL) 50 MG 24 hr tablet   Anxiety    The current medical regimen is effective;  continue present plan and medications.       Relevant Medications   traZODone (DESYREL) 100 MG tablet   LORazepam (ATIVAN) 0.5 MG tablet   Bilateral leg edema    The current medical regimen is effective;  continue present plan and medications.       Back pain at L4-L5 level   Relevant Medications   traMADol (ULTRAM) 50 MG tablet   Advanced care planning/counseling discussion    A voluntary discussion about advanced care planning including explanation and  discussion of advanced directives was extentively discussed with the patient.  Explained about the healthcare proxy and living will was reviewed and packet with forms with expiration of how to fill them out was given.  Time spent: Encounter 16+ min individuals present: Patient      Insomnia    The current medical regimen is effective;  continue present plan and medications.          Telemedicine using audio/video telecommunications for a synchronous communication visit. Today's visit due to COVID-19 isolation precautions I connected with and verified that I am speaking with the correct person using two identifiers.   I discussed the limitations, risks, security and privacy concerns of performing an evaluation and management service by telecommunication and the availability of in person appointments. I also discussed with the patient that there may be a patient responsible charge related to this service. The patient expressed understanding and agreed to proceed. The patient's location is home. I am at home.   I discussed the assessment and treatment plan with the patient. The patient was provided an opportunity to ask questions and all were answered. The patient agreed with the plan and demonstrated an understanding of the instructions.   The patient was advised to call back or seek an in-person evaluation if the symptoms worsen or if the condition fails to improve as anticipated.   I provided 21+ minutes of time during this encounter. Follow up plan: Return in about 6 months (around 10/31/2019) for BMP,  Lipids, ALT, AST.

## 2019-05-08 ENCOUNTER — Other Ambulatory Visit: Payer: Medicare Other

## 2019-05-08 ENCOUNTER — Other Ambulatory Visit: Payer: Self-pay

## 2019-05-08 DIAGNOSIS — E7800 Pure hypercholesterolemia, unspecified: Secondary | ICD-10-CM | POA: Diagnosis not present

## 2019-05-08 DIAGNOSIS — I10 Essential (primary) hypertension: Secondary | ICD-10-CM | POA: Diagnosis not present

## 2019-05-08 DIAGNOSIS — M4626 Osteomyelitis of vertebra, lumbar region: Secondary | ICD-10-CM | POA: Diagnosis not present

## 2019-05-08 LAB — URINALYSIS, ROUTINE W REFLEX MICROSCOPIC
Bilirubin, UA: NEGATIVE
Glucose, UA: NEGATIVE
Ketones, UA: NEGATIVE
Nitrite, UA: POSITIVE — AB
Protein,UA: NEGATIVE
Specific Gravity, UA: 1.015 (ref 1.005–1.030)
Urobilinogen, Ur: 0.2 mg/dL (ref 0.2–1.0)
pH, UA: 6 (ref 5.0–7.5)

## 2019-05-08 LAB — MICROSCOPIC EXAMINATION: WBC, UA: >30 /hpf — AB (ref 0–5)

## 2019-05-09 LAB — COMPREHENSIVE METABOLIC PANEL
ALT: 8 IU/L (ref 0–32)
AST: 19 IU/L (ref 0–40)
Albumin/Globulin Ratio: 1.8 (ref 1.2–2.2)
Albumin: 4.2 g/dL (ref 3.7–4.7)
Alkaline Phosphatase: 36 IU/L — ABNORMAL LOW (ref 39–117)
BUN/Creatinine Ratio: 32 — ABNORMAL HIGH (ref 12–28)
BUN: 28 mg/dL — ABNORMAL HIGH (ref 8–27)
Bilirubin Total: 0.5 mg/dL (ref 0.0–1.2)
CO2: 31 mmol/L — ABNORMAL HIGH (ref 20–29)
Calcium: 10.2 mg/dL (ref 8.7–10.3)
Chloride: 98 mmol/L (ref 96–106)
Creatinine, Ser: 0.88 mg/dL (ref 0.57–1.00)
GFR calc Af Amer: 75 mL/min/{1.73_m2} (ref >59)
GFR calc non Af Amer: 65 mL/min/{1.73_m2} (ref >59)
Globulin, Total: 2.3 g/dL (ref 1.5–4.5)
Glucose: 126 mg/dL — ABNORMAL HIGH (ref 65–99)
Potassium: 4.3 mmol/L (ref 3.5–5.2)
Sodium: 144 mmol/L (ref 134–144)
Total Protein: 6.5 g/dL (ref 6.0–8.5)

## 2019-05-09 LAB — CBC WITH DIFFERENTIAL/PLATELET
Basophils Absolute: 0.1 10*3/uL (ref 0.0–0.2)
Basos: 1 %
EOS (ABSOLUTE): 0.2 10*3/uL (ref 0.0–0.4)
Eos: 2 %
Hematocrit: 44.2 % (ref 34.0–46.6)
Hemoglobin: 13.8 g/dL (ref 11.1–15.9)
Immature Grans (Abs): 0 10*3/uL (ref 0.0–0.1)
Immature Granulocytes: 0 %
Lymphocytes Absolute: 5.2 10*3/uL — ABNORMAL HIGH (ref 0.7–3.1)
Lymphs: 56 %
MCH: 29.6 pg (ref 26.6–33.0)
MCHC: 31.2 g/dL — ABNORMAL LOW (ref 31.5–35.7)
MCV: 95 fL (ref 79–97)
Monocytes Absolute: 0.5 10*3/uL (ref 0.1–0.9)
Monocytes: 5 %
Neutrophils Absolute: 3.4 10*3/uL (ref 1.4–7.0)
Neutrophils: 36 %
Platelets: 171 10*3/uL (ref 150–450)
RBC: 4.66 x10E6/uL (ref 3.77–5.28)
RDW: 13 % (ref 11.7–15.4)
WBC: 9.4 10*3/uL (ref 3.4–10.8)

## 2019-05-09 LAB — LIPID PANEL
Chol/HDL Ratio: 3 ratio (ref 0.0–4.4)
Cholesterol, Total: 146 mg/dL (ref 100–199)
HDL: 48 mg/dL (ref >39)
LDL Calculated: 56 mg/dL (ref 0–99)
Triglycerides: 210 mg/dL — ABNORMAL HIGH (ref 0–149)
VLDL Cholesterol Cal: 42 mg/dL — ABNORMAL HIGH (ref 5–40)

## 2019-05-09 LAB — SEDIMENTATION RATE: Sed Rate: 7 mm/hr (ref 0–40)

## 2019-05-09 LAB — TSH: TSH: 1.83 u[IU]/mL (ref 0.450–4.500)

## 2019-05-15 ENCOUNTER — Telehealth: Payer: Self-pay | Admitting: Family Medicine

## 2019-05-15 NOTE — Telephone Encounter (Signed)
Pt has been trying to treat her UTI with AZO but would like Dr. Jeananne Rama to call in Cipro since it is not getting any better/ please advise

## 2019-05-17 MED ORDER — CIPROFLOXACIN HCL 250 MG PO TABS: 250.0000 mg | ORAL_TABLET | Freq: Two times a day (BID) | ORAL | 0 refills | Status: DC

## 2019-05-17 NOTE — Telephone Encounter (Signed)
Call pt rx sent

## 2019-05-19 NOTE — Telephone Encounter (Signed)
Patient notified

## 2019-05-30 ENCOUNTER — Telehealth (INDEPENDENT_AMBULATORY_CARE_PROVIDER_SITE_OTHER): Payer: Self-pay | Admitting: Vascular Surgery

## 2019-05-30 NOTE — Telephone Encounter (Signed)
Unfortunately, with Lymphedema we need to be able to physically assess leg swelling.  I certainly understand the concern with Covid 19.  If she is not having any issues at this time, we can push the appointment out 6 months to see if the Covid 19 situation has improved somewhat.  However if she is having issues or wishes to discuss some things, we can discuss all of the additional precautions that we have taken in the office to protect staff and patients.

## 2019-05-30 NOTE — Telephone Encounter (Signed)
See Tracy Espinoza recommendation below.

## 2019-05-30 NOTE — Telephone Encounter (Signed)
Can this patient see you for a virtual visit.

## 2019-06-17 ENCOUNTER — Other Ambulatory Visit: Payer: Self-pay

## 2019-06-17 ENCOUNTER — Ambulatory Visit (INDEPENDENT_AMBULATORY_CARE_PROVIDER_SITE_OTHER): Payer: Medicare Other

## 2019-06-17 DIAGNOSIS — Z23 Encounter for immunization: Secondary | ICD-10-CM

## 2019-06-23 ENCOUNTER — Other Ambulatory Visit: Payer: Self-pay | Admitting: Family Medicine

## 2019-06-23 NOTE — Telephone Encounter (Signed)
Copied from [place] [phone]. Topic: Quick Communication - Rx Refill/Question >> Jun 23, 2019 10:33 AM Rainey Pines A wrote: Medication: ciprofloxacin (CIPRO) 250 MG tablet (Patient stated that she needs medication refilled again for her urinary tract infection.)  Has the patient contacted their pharmacy?yES (Agent: If no, request that the patient contact the pharmacy for the refill.) (Agent: If yes, when and what did the pharmacy advise?)Contact PCP  Preferred Pharmacy (with phone number or street name): [id number] - [place], [place] [address] [phone] (Phone) [phone] (Fax)    Agent: Please be advised that RX refills may take up to 3 business days. We ask that you follow-up with your pharmacy.

## 2019-06-27 ENCOUNTER — Ambulatory Visit (INDEPENDENT_AMBULATORY_CARE_PROVIDER_SITE_OTHER): Payer: Medicare Other | Admitting: Vascular Surgery

## 2019-07-03 ENCOUNTER — Other Ambulatory Visit: Payer: Self-pay | Admitting: Family Medicine

## 2019-07-09 ENCOUNTER — Other Ambulatory Visit: Payer: Self-pay | Admitting: Family Medicine

## 2019-07-09 DIAGNOSIS — Z1231 Encounter for screening mammogram for malignant neoplasm of breast: Secondary | ICD-10-CM

## 2019-07-14 ENCOUNTER — Other Ambulatory Visit: Payer: Self-pay

## 2019-07-14 ENCOUNTER — Ambulatory Visit
Admission: RE | Admit: 2019-07-14 | Discharge: 2019-07-14 | Disposition: A | Discharge status: Home / Self Care | Payer: Medicare Other | Source: Ambulatory Visit | Attending: Family Medicine | Admitting: Family Medicine

## 2019-07-14 DIAGNOSIS — Z1231 Encounter for screening mammogram for malignant neoplasm of breast: Secondary | ICD-10-CM

## 2019-07-16 ENCOUNTER — Encounter: Payer: Self-pay | Admitting: Family Medicine

## 2019-07-29 ENCOUNTER — Telehealth: Payer: Self-pay | Admitting: Family Medicine

## 2019-07-29 NOTE — Telephone Encounter (Signed)
losartan (COZAAR) 100 MG tablet  Pt need 6day of this medication until [facility] can send it to her   Send to [facility]

## 2019-07-29 NOTE — Telephone Encounter (Signed)
Routing to provider  

## 2019-07-30 DIAGNOSIS — M7542 Impingement syndrome of left shoulder: Secondary | ICD-10-CM | POA: Diagnosis not present

## 2019-07-30 DIAGNOSIS — M7541 Impingement syndrome of right shoulder: Secondary | ICD-10-CM | POA: Diagnosis not present

## 2019-07-30 MED ORDER — LOSARTAN POTASSIUM 100 MG PO TABS: 100.0000 mg | ORAL_TABLET | Freq: Every day | ORAL | 3 refills | Status: DC

## 2019-07-30 NOTE — Telephone Encounter (Signed)
Received a fax also.

## 2019-07-30 NOTE — Telephone Encounter (Signed)
Done

## 2019-08-14 ENCOUNTER — Ambulatory Visit (INDEPENDENT_AMBULATORY_CARE_PROVIDER_SITE_OTHER): Payer: Medicare Other | Admitting: Family Medicine

## 2019-08-14 ENCOUNTER — Other Ambulatory Visit: Payer: Self-pay

## 2019-08-14 ENCOUNTER — Encounter: Payer: Self-pay | Admitting: Family Medicine

## 2019-08-14 DIAGNOSIS — R300 Dysuria: Secondary | ICD-10-CM | POA: Diagnosis not present

## 2019-08-14 MED ORDER — NITROFURANTOIN MONOHYD MACRO 100 MG PO CAPS: 100.0000 mg | ORAL_CAPSULE | Freq: Two times a day (BID) | ORAL | 0 refills | Status: DC

## 2019-08-14 NOTE — Progress Notes (Signed)
There were no vitals taken for this visit.   Subjective:    Patient ID: Tracy Espinoza, female    DOB: [birth year], 73 y.o.   MRN: [id number]  HPI: Tracy Espinoza is a 73 y.o. female  Chief Complaint  Patient presents with  . Urinary Tract Infection   URINARY SYMPTOMS Duration: Last night Dysuria: no Urinary frequency: yes Urgency: yes Small volume voids: no Symptom severity: moderate Urinary incontinence: yes Foul odor: no Hematuria: no Abdominal pain: yes- R side Back pain: no Suprapubic pain/pressure: no Flank pain: no Fever:  no Vomiting: no Relief with cranberry juice: no Relief with pyridium: no Status: worse Previous urinary tract infection: no Recurrent urinary tract infection: no History of sexually transmitted disease: no Vaginal discharge: no Treatments attempted: increasing fluids   Relevant past medical, surgical, family and social history reviewed and updated as indicated. Interim medical history since our last visit reviewed. Allergies and medications reviewed and updated.  Review of Systems  Constitutional: Negative.   Respiratory: Negative.   Cardiovascular: Negative.   Gastrointestinal: Positive for abdominal pain. Negative for abdominal distention, anal bleeding, blood in stool, constipation, diarrhea, nausea, rectal pain and vomiting.  Genitourinary: Positive for frequency and urgency. Negative for decreased urine volume, difficulty urinating, dyspareunia, dysuria, enuresis, flank pain, genital sores, hematuria, menstrual problem, pelvic pain, vaginal bleeding, vaginal discharge and vaginal pain.  Musculoskeletal: Negative.   Skin: Negative.   Psychiatric/Behavioral: Negative.     Per HPI unless specifically indicated above     Objective:    There were no vitals taken for this visit.  Wt Readings from Last 3 Encounters:  10/24/18 285 lb (129.3 kg)  07/24/18 259 lb (117.5 kg)  06/28/18 284 lb (128.8 kg)    Physical Exam Vitals signs  and nursing note reviewed.  Pulmonary:     Effort: Pulmonary effort is normal. No respiratory distress.     Comments: Speaking in full sentences Neurological:     Mental Status: She is alert.  Psychiatric:        Mood and Affect: Mood normal.        Behavior: Behavior normal.        Thought Content: Thought content normal.        Judgment: Judgment normal.     Results for orders placed or performed in visit on 05/08/19  Microscopic Examination   URINE  Result Value Ref Range   WBC, UA >30 (A) 0 - 5 /hpf   RBC 3-10 (A) 0 - 2 /hpf   Epithelial Cells (non renal) 0-10 0 - 10 /hpf   Bacteria, UA Many (A) None seen/Few  Sed Rate (ESR)  Result Value Ref Range   Sed Rate 7 0 - 40 mm/hr  Urinalysis, Routine w reflex microscopic  Result Value Ref Range   Specific Gravity, UA 1.015 1.005 - 1.030   pH, UA 6.0 5.0 - 7.5   Color, UA Yellow Yellow   Appearance Ur Cloudy (A) Clear   Leukocytes,UA 3+ (A) Negative   Protein,UA Negative Negative/Trace   Glucose, UA Negative Negative   Ketones, UA Negative Negative   RBC, UA 2+ (A) Negative   Bilirubin, UA Negative Negative   Urobilinogen, Ur 0.2 0.2 - 1.0 mg/dL   Nitrite, UA Positive (A) Negative   Microscopic Examination See below:   TSH  Result Value Ref Range   TSH 1.830 0.450 - 4.500 uIU/mL  CBC with Differential/Platelet  Result Value Ref Range   WBC 9.4 3.4 -  10.8 x10E3/uL   RBC 4.66 3.77 - 5.28 x10E6/uL   Hemoglobin 13.8 11.1 - 15.9 g/dL   Hematocrit 44.2 34.0 - 46.6 %   MCV 95 79 - 97 fL   MCH 29.6 26.6 - 33.0 pg   MCHC 31.2 (L) 31.5 - 35.7 g/dL   RDW 13.0 11.7 - 15.4 %   Platelets 171 150 - 450 x10E3/uL   Neutrophils 36 Not Estab. %   Lymphs 56 Not Estab. %   Monocytes 5 Not Estab. %   Eos 2 Not Estab. %   Basos 1 Not Estab. %   Neutrophils Absolute 3.4 1.4 - 7.0 x10E3/uL   Lymphocytes Absolute 5.2 (H) 0.7 - 3.1 x10E3/uL   Monocytes Absolute 0.5 0.1 - 0.9 x10E3/uL   EOS (ABSOLUTE) 0.2 0.0 - 0.4 x10E3/uL    Basophils Absolute 0.1 0.0 - 0.2 x10E3/uL   Immature Granulocytes 0 Not Estab. %   Immature Grans (Abs) 0.0 0.0 - 0.1 x10E3/uL  Lipid panel  Result Value Ref Range   Cholesterol, Total 146 100 - 199 mg/dL   Triglycerides 210 (H) 0 - 149 mg/dL   HDL 48 >39 mg/dL   VLDL Cholesterol Cal 42 (H) 5 - 40 mg/dL   LDL Calculated 56 0 - 99 mg/dL   Chol/HDL Ratio 3.0 0.0 - 4.4 ratio  Comprehensive metabolic panel  Result Value Ref Range   Glucose 126 (H) 65 - 99 mg/dL   BUN 28 (H) 8 - 27 mg/dL   Creatinine, Ser 0.88 0.57 - 1.00 mg/dL   GFR calc non Af Amer 65 >59 mL/min/1.73   GFR calc Af Amer 75 >59 mL/min/1.73   BUN/Creatinine Ratio 32 (H) 12 - 28   Sodium 144 134 - 144 mmol/L   Potassium 4.3 3.5 - 5.2 mmol/L   Chloride 98 96 - 106 mmol/L   CO2 31 (H) 20 - 29 mmol/L   Calcium 10.2 8.7 - 10.3 mg/dL   Total Protein 6.5 6.0 - 8.5 g/dL   Albumin 4.2 3.7 - 4.7 g/dL   Globulin, Total 2.3 1.5 - 4.5 g/dL   Albumin/Globulin Ratio 1.8 1.2 - 2.2   Bilirubin Total 0.5 0.0 - 1.2 mg/dL   Alkaline Phosphatase 36 (L) 39 - 117 IU/L   AST 19 0 - 40 IU/L   ALT 8 0 - 32 IU/L      Assessment & Plan:   Problem List Items Addressed This Visit    None    Visit Diagnoses    Dysuria    -  Primary   concern for UTI- cannot drop off urine right now due to rain. Will await sample and start macrobid. Call if not getting better or getting worse.    Relevant Orders   UA/M w/rflx Culture, Routine       Follow up plan: Return if symptoms worsen or fail to improve.   . This visit was completed via telephone due to the restrictions of the COVID-19 pandemic. All issues as above were discussed and addressed but no physical exam was performed. If it was felt that the patient should be evaluated in the office, they were directed there. The patient verbally consented to this visit. Patient was unable to complete an audio/visual visit due to Lack of equipment. Due to the catastrophic nature of the COVID-19 pandemic,  this visit was done through audio contact only. . Location of the patient: home . Location of the provider: home . Those involved with this call:  . Provider:  Park Liter, DO . CMA: Tiffany Reel, CMA . Front Desk/Registration: Don Perking  . Time spent on call: 21 minutes on the phone discussing health concerns. 23 minutes total spent in review of patient's record and preparation of their chart.

## 2019-08-19 ENCOUNTER — Other Ambulatory Visit: Payer: Self-pay | Admitting: Family Medicine

## 2019-08-19 MED ORDER — TRAZODONE HCL 100 MG PO TABS: 100.0000 mg | ORAL_TABLET | Freq: Every day | ORAL | 0 refills | Status: DC

## 2019-08-19 NOTE — Telephone Encounter (Signed)
Medication Refill - Medication: traZODone (DESYREL) 100 MG tablet  Has the patient contacted their pharmacy? Yes - pharmacy states they are not getting a response to their requests (Agent: If no, request that the patient contact the pharmacy for the refill.) (Agent: If yes, when and what did the pharmacy advise?)  Preferred Pharmacy (with phone number or street name):  [id number] - [place], [place] [address] [phone] (Phone) [phone] (Fax)   Agent: Please be advised that RX refills may take up to 3 business days. We ask that you follow-up with your pharmacy.

## 2019-08-27 ENCOUNTER — Other Ambulatory Visit: Payer: Self-pay

## 2019-09-29 ENCOUNTER — Other Ambulatory Visit: Payer: Self-pay

## 2019-09-29 ENCOUNTER — Encounter: Payer: Self-pay | Admitting: Family Medicine

## 2019-09-29 ENCOUNTER — Ambulatory Visit (INDEPENDENT_AMBULATORY_CARE_PROVIDER_SITE_OTHER): Payer: Medicare Other | Admitting: Family Medicine

## 2019-09-29 VITALS — BP 112/75 | HR 69 | Temp 97.7°F | Ht 61.0 in | Wt 285.0 lb

## 2019-09-29 DIAGNOSIS — N390 Urinary tract infection, site not specified: Secondary | ICD-10-CM | POA: Diagnosis not present

## 2019-09-29 MED ORDER — NITROFURANTOIN MONOHYD MACRO 100 MG PO CAPS: 100.0000 mg | ORAL_CAPSULE | Freq: Two times a day (BID) | ORAL | 0 refills | Status: DC

## 2019-09-29 NOTE — Progress Notes (Signed)
BP 112/75   Pulse 69   Temp 97.7 F (36.5 C) (Oral)   Ht 5\' 1"  (1.549 m)   Wt 285 lb (129.3 kg)   SpO2 93%   BMI 53.85 kg/m    Subjective:    Patient ID: Tracy Espinoza, female    DOB: March 21, 1946, 73 y.o.   MRN: [id number]  HPI: Tracy Espinoza is a 73 y.o. female  Chief Complaint  Patient presents with  . Back Pain    lower. x about a week  . Abdominal Pain    lower right side.  . Urinary Frequency    dark brown urine   Over a week of right low back pain, lower abdominal pain, and dark brown urine with increased frequency. Home UTI test was positive last week. Has not tried anything OTC for sxs. Denies fever, chills, N/V/D.   Relevant past medical, surgical, family and social history reviewed and updated as indicated. Interim medical history since our last visit reviewed. Allergies and medications reviewed and updated.  Review of Systems  Per HPI unless specifically indicated above     Objective:    BP 112/75   Pulse 69   Temp 97.7 F (36.5 C) (Oral)   Ht 5\' 1"  (1.549 m)   Wt 285 lb (129.3 kg)   SpO2 93%   BMI 53.85 kg/m   Wt Readings from Last 3 Encounters:  09/29/19 285 lb (129.3 kg)  10/24/18 285 lb (129.3 kg)  07/24/18 259 lb (117.5 kg)    Physical Exam Vitals and nursing note reviewed.  Constitutional:      Appearance: Normal appearance. She is not ill-appearing.  HENT:     Head: Atraumatic.  Eyes:     Extraocular Movements: Extraocular movements intact.     Conjunctiva/sclera: Conjunctivae normal.  Cardiovascular:     Rate and Rhythm: Normal rate and regular rhythm.     Heart sounds: Normal heart sounds.  Pulmonary:     Effort: Pulmonary effort is normal.     Breath sounds: Normal breath sounds.  Abdominal:     General: Bowel sounds are normal.     Palpations: Abdomen is soft.     Tenderness: There is no abdominal tenderness. There is no right CVA tenderness, left CVA tenderness or guarding.  Musculoskeletal:        General: Normal range  of motion.     Cervical back: Normal range of motion and neck supple.  Skin:    General: Skin is warm and dry.  Neurological:     Mental Status: She is alert and oriented to person, place, and time.  Psychiatric:        Mood and Affect: Mood normal.        Thought Content: Thought content normal.        Judgment: Judgment normal.     Results for orders placed or performed in visit on 09/29/19  Microscopic Examination   URINE  Result Value Ref Range   WBC, UA >30 (A) 0 - 5 /hpf   RBC 11-30 (A) 0 - 2 /hpf   Epithelial Cells (non renal) 0-10 0 - 10 /hpf   Bacteria, UA Many (A) None seen/Few  Urine Culture, Reflex   URINE  Result Value Ref Range   Urine Culture, Routine Preliminary report (A)    Organism ID, Bacteria Escherichia coli (A)   UA/M w/rflx Culture, Routine   Specimen: Urine   URINE  Result Value Ref Range   Specific Gravity,  UA 1.015 1.005 - 1.030   pH, UA 6.0 5.0 - 7.5   Color, UA Yellow Yellow   Appearance Ur Cloudy (A) Clear   Leukocytes,UA 2+ (A) Negative   Protein,UA 2+ (A) Negative/Trace   Glucose, UA Negative Negative   Ketones, UA Negative Negative   RBC, UA 3+ (A) Negative   Bilirubin, UA Negative Negative   Urobilinogen, Ur 0.2 0.2 - 1.0 mg/dL   Nitrite, UA Positive (A) Negative   Microscopic Examination See below:    Urinalysis Reflex Comment       Assessment & Plan:   Problem List Items Addressed This Visit    None    Visit Diagnoses    Acute lower UTI    -  Primary   Tx with macrobid, await cx. Push fluids, probiotics. F/u if not improving   Relevant Medications   nitrofurantoin, macrocrystal-monohydrate, (MACROBID) 100 MG capsule   Other Relevant Orders   UA/M w/rflx Culture, Routine (Completed)       Follow up plan: Return if symptoms worsen or fail to improve.

## 2019-10-04 LAB — URINE CULTURE, REFLEX

## 2019-10-04 LAB — MICROSCOPIC EXAMINATION: WBC, UA: >30 /hpf — AB (ref 0–5)

## 2019-10-04 LAB — UA/M W/RFLX CULTURE, ROUTINE
Bilirubin, UA: NEGATIVE
Glucose, UA: NEGATIVE
Ketones, UA: NEGATIVE
Nitrite, UA: POSITIVE — AB
Specific Gravity, UA: 1.015 (ref 1.005–1.030)
Urobilinogen, Ur: 0.2 mg/dL (ref 0.2–1.0)
pH, UA: 6 (ref 5.0–7.5)

## 2019-10-08 ENCOUNTER — Telehealth: Payer: Self-pay | Admitting: Family Medicine

## 2019-10-08 MED ORDER — CIPROFLOXACIN HCL 250 MG PO TABS: 250.0000 mg | ORAL_TABLET | Freq: Two times a day (BID) | ORAL | 0 refills | Status: DC

## 2019-10-08 NOTE — Telephone Encounter (Signed)
Per her urine culture, the macrobid should have taken care of the infection but I sent over a different antibiotic that should also work for her to take. If still not better will need to leave another specimen so we can recheck things

## 2019-10-08 NOTE — Telephone Encounter (Signed)
Patient notified and verbalized understanding. 

## 2019-10-08 NOTE — Telephone Encounter (Signed)
Copied from [place] [id number]. Topic: General - Other >> Oct 08, 2019  2:37 PM Celene Kras wrote: Reason for CRM: Pt called stating that she took all of the medication for the uti. PT states she is still having symptoms. Please advise.   [id number] [place],  - [address] [place] [address] Phone: [phone] Fax: [phone] Not a 24 hour pharmacy; exact hours not known.

## 2019-10-28 ENCOUNTER — Other Ambulatory Visit: Payer: Self-pay

## 2019-10-28 DIAGNOSIS — M545 Low back pain, unspecified: Secondary | ICD-10-CM

## 2019-10-28 MED ORDER — TRAZODONE HCL 100 MG PO TABS: 100.0000 mg | ORAL_TABLET | Freq: Every day | ORAL | 0 refills | Status: DC

## 2019-10-28 NOTE — Telephone Encounter (Signed)
She should not be due until 1/29. Please see if she has enough to make it to her appointment with Apolonio Schneiders next week.

## 2019-10-28 NOTE — Telephone Encounter (Signed)
Refill request for Trazodone. LOV 09/29/2019 Next OV 11/05/2019 both with Apolonio Schneiders

## 2019-10-28 NOTE — Telephone Encounter (Signed)
Called patient. She states that she has enough until her appointment next week.

## 2019-10-28 NOTE — Telephone Encounter (Signed)
Refill request for Tramadol. LOV 09/29/2019 Next Appt: 11/05/2019 both with Merrie Roof.

## 2019-10-30 ENCOUNTER — Other Ambulatory Visit: Payer: Self-pay

## 2019-10-30 DIAGNOSIS — M545 Low back pain, unspecified: Secondary | ICD-10-CM

## 2019-10-30 NOTE — Telephone Encounter (Signed)
Rx request for traMADol (ULTRAM) 50 MG tablet LOV: 09/29/2019 Next OV: 11/05/2019 with Merrie Roof PA-C

## 2019-10-31 MED ORDER — TRAMADOL HCL 50 MG PO TABS: 50.0000 mg | ORAL_TABLET | Freq: Two times a day (BID) | ORAL | 0 refills | Status: DC | PRN

## 2019-11-05 ENCOUNTER — Ambulatory Visit (INDEPENDENT_AMBULATORY_CARE_PROVIDER_SITE_OTHER): Payer: Medicare Other | Admitting: Family Medicine

## 2019-11-05 ENCOUNTER — Ambulatory Visit: Payer: Medicare Other | Admitting: Family Medicine

## 2019-11-05 ENCOUNTER — Other Ambulatory Visit: Payer: Self-pay

## 2019-11-05 ENCOUNTER — Encounter: Payer: Self-pay | Admitting: Family Medicine

## 2019-11-05 ENCOUNTER — Ambulatory Visit: Payer: Self-pay | Admitting: Family Medicine

## 2019-11-05 VITALS — BP 138/82 | HR 68 | Temp 98.1°F | Ht 61.0 in | Wt 283.0 lb

## 2019-11-05 DIAGNOSIS — R21 Rash and other nonspecific skin eruption: Secondary | ICD-10-CM | POA: Diagnosis not present

## 2019-11-05 DIAGNOSIS — M545 Low back pain, unspecified: Secondary | ICD-10-CM

## 2019-11-05 DIAGNOSIS — E7800 Pure hypercholesterolemia, unspecified: Secondary | ICD-10-CM | POA: Diagnosis not present

## 2019-11-05 DIAGNOSIS — F419 Anxiety disorder, unspecified: Secondary | ICD-10-CM

## 2019-11-05 DIAGNOSIS — R918 Other nonspecific abnormal finding of lung field: Secondary | ICD-10-CM

## 2019-11-05 DIAGNOSIS — I10 Essential (primary) hypertension: Secondary | ICD-10-CM | POA: Diagnosis not present

## 2019-11-05 MED ORDER — SERTRALINE HCL 50 MG PO TABS: 50.0000 mg | ORAL_TABLET | Freq: Every day | ORAL | 0 refills | Status: DC

## 2019-11-05 MED ORDER — GABAPENTIN 400 MG PO CAPS: ORAL_CAPSULE | ORAL | 1 refills | Status: DC

## 2019-11-05 MED ORDER — HYDROCHLOROTHIAZIDE 25 MG PO TABS: 25.0000 mg | ORAL_TABLET | Freq: Every day | ORAL | 4 refills | Status: DC

## 2019-11-05 MED ORDER — METOPROLOL SUCCINATE ER 50 MG PO TB24: 50.0000 mg | ORAL_TABLET | Freq: Every day | ORAL | 4 refills | Status: DC

## 2019-11-05 MED ORDER — OMEPRAZOLE 40 MG PO CPDR: 40.0000 mg | DELAYED_RELEASE_CAPSULE | Freq: Every day | ORAL | 4 refills | Status: DC

## 2019-11-05 MED ORDER — LOSARTAN POTASSIUM 100 MG PO TABS: 100.0000 mg | ORAL_TABLET | Freq: Every day | ORAL | 1 refills | Status: DC

## 2019-11-05 MED ORDER — LORAZEPAM 0.5 MG PO TABS: 0.5000 mg | ORAL_TABLET | Freq: Every day | ORAL | 0 refills | Status: DC

## 2019-11-05 MED ORDER — TRAZODONE HCL 100 MG PO TABS: 100.0000 mg | ORAL_TABLET | Freq: Every day | ORAL | 0 refills | Status: DC

## 2019-11-05 MED ORDER — NYSTATIN 100000 UNIT/GM EX CREA: 1.0000 "application " | TOPICAL_CREAM | Freq: Two times a day (BID) | CUTANEOUS | 1 refills | Status: AC

## 2019-11-05 MED ORDER — NYSTATIN 100000 UNIT/GM EX POWD: 1.0000 "application " | Freq: Two times a day (BID) | CUTANEOUS | 1 refills | Status: AC

## 2019-11-05 NOTE — Progress Notes (Signed)
BP 138/82   Pulse 68   Temp 98.1 F (36.7 C) (Oral)   Ht 5\' 1"  (1.549 m)   Wt 283 lb (128.4 kg)   SpO2 95%   BMI 53.47 kg/m    Subjective:    Patient ID: Tracy Espinoza, female    DOB: 09/17/1946, 74 y.o.   MRN: [id number]  HPI: Tracy Espinoza is a 74 y.o. female  Chief Complaint  Patient presents with  . Hypertension  . Hyperlipidemia  . Anxiety   Patient presenting today for 6 month f/u chronic conditions.   HTN - Taking medications faithfully without side effects. Denies CP, SOB, HAs, dizziness.   HLD - currently diet controlled. Does not follow strict diet, unable to exercise due to chronic pain.   Several years ago was having numerous UTIs. Went to Urology and it was found that there was a spot on left lung and spleen in addition to the urologic issues she was having. States she's due for repeat screening CT. No breathing sxs.   Gabapentin helping quite a bit with her back pain , has been taking 4 capsules daily which works very well in addition to her BID tramadol regimen.   Having a really hard time with anxiety at this time with some family issues. Taking ativan daily right now with this going on. Still having significant anxiety consistently. Crying often as well due to these issues. Denies SI/HI.   Insomnia - on trazodone regimen, which is working well for her.   Rash under breasts - itchy, red. Has happened in the past from time to time. Trying powders with minimal relief.   Depression screen [facility] 2/9 11/05/2019 03/26/2019 10/24/2018  Decreased Interest 1 0 0  Down, Depressed, Hopeless 2 3 0  PHQ - 2 Score 3 3 0  Altered sleeping 0 3 3  Tired, decreased energy 2 0 1  Change in appetite 1 0 0  Feeling bad or failure about yourself  0 1 0  Trouble concentrating 0 0 0  Moving slowly or fidgety/restless 0 0 0  Suicidal thoughts 0 0 0  PHQ-9 Score 6 7 4   Difficult doing work/chores - Not difficult at all -   GAD 7 : Generalized Anxiety Score 11/05/2019  Nervous,  Anxious, on Edge 1  Control/stop worrying 1  Worry too much - different things 0  Trouble relaxing 1  Restless 0  Easily annoyed or irritable 0  Afraid - awful might happen 1  Total GAD 7 Score 4  Anxiety Difficulty Somewhat difficult    Relevant past medical, surgical, family and social history reviewed and updated as indicated. Interim medical history since our last visit reviewed. Allergies and medications reviewed and updated.  Review of Systems  Per HPI unless specifically indicated above     Objective:    BP 138/82   Pulse 68   Temp 98.1 F (36.7 C) (Oral)   Ht 5\' 1"  (1.549 m)   Wt 283 lb (128.4 kg)   SpO2 95%   BMI 53.47 kg/m   Wt Readings from Last 3 Encounters:  11/05/19 283 lb (128.4 kg)  09/29/19 285 lb (129.3 kg)  10/24/18 285 lb (129.3 kg)    Physical Exam Vitals and nursing note reviewed.  Constitutional:      Appearance: Normal appearance. She is not ill-appearing.  HENT:     Head: Atraumatic.  Eyes:     Extraocular Movements: Extraocular movements intact.     Conjunctiva/sclera: Conjunctivae normal.  Cardiovascular:     Rate and Rhythm: Normal rate and regular rhythm.     Heart sounds: Normal heart sounds.  Pulmonary:     Effort: Pulmonary effort is normal.     Breath sounds: Normal breath sounds.  Abdominal:     General: Bowel sounds are normal. There is no distension.     Palpations: Abdomen is soft.     Tenderness: There is no abdominal tenderness.  Musculoskeletal:        General: Normal range of motion.     Cervical back: Normal range of motion and neck supple.  Skin:    General: Skin is warm.     Comments: Erythematous macerated rash under b/l breasts  Neurological:     Mental Status: She is alert and oriented to person, place, and time.  Psychiatric:        Mood and Affect: Mood normal.        Thought Content: Thought content normal.        Judgment: Judgment normal.     Results for orders placed or performed in visit on  11/05/19  Comprehensive metabolic panel  Result Value Ref Range   Glucose 103 (H) 65 - 99 mg/dL   BUN 24 8 - 27 mg/dL   Creatinine, Ser 0.90 0.57 - 1.00 mg/dL   GFR calc non Af Amer 63 >59 mL/min/1.73   GFR calc Af Amer 73 >59 mL/min/1.73   BUN/Creatinine Ratio 27 12 - 28   Sodium 143 134 - 144 mmol/L   Potassium 3.8 3.5 - 5.2 mmol/L   Chloride 97 96 - 106 mmol/L   CO2 27 20 - 29 mmol/L   Calcium 9.9 8.7 - 10.3 mg/dL   Total Protein 6.6 6.0 - 8.5 g/dL   Albumin 4.4 3.7 - 4.7 g/dL   Globulin, Total 2.2 1.5 - 4.5 g/dL   Albumin/Globulin Ratio 2.0 1.2 - 2.2   Bilirubin Total 0.7 0.0 - 1.2 mg/dL   Alkaline Phosphatase 44 39 - 117 IU/L   AST 20 0 - 40 IU/L   ALT 15 0 - 32 IU/L  Lipid Panel w/o Chol/HDL Ratio  Result Value Ref Range   Cholesterol, Total 161 100 - 199 mg/dL   Triglycerides 202 (H) 0 - 149 mg/dL   HDL 51 >39 mg/dL   VLDL Cholesterol Cal 34 5 - 40 mg/dL   LDL Chol Calc (NIH) 76 0 - 99 mg/dL      Assessment & Plan:   Problem List Items Addressed This Visit      Cardiovascular and Mediastinum   Hypertension - Primary    BPs stable and under good control, continue current regimen      Relevant Medications   losartan (COZAAR) 100 MG tablet   metoprolol succinate (TOPROL-XL) 50 MG 24 hr tablet   hydrochlorothiazide (HYDRODIURIL) 25 MG tablet   Other Relevant Orders   Comprehensive metabolic panel (Completed)     Other   Hypercholesteremia    Recheck labs, continue working on lifestyle modifications and adjust as needed      Relevant Medications   losartan (COZAAR) 100 MG tablet   metoprolol succinate (TOPROL-XL) 50 MG 24 hr tablet   hydrochlorothiazide (HYDRODIURIL) 25 MG tablet   Other Relevant Orders   Lipid Panel w/o Chol/HDL Ratio (Completed)   Anxiety    Exacerbated by family stressors. Start daily zoloft, continue ativan regimen with close monitoring      Relevant Medications   traZODone (DESYREL) 100 MG tablet  sertraline (ZOLOFT) 50 MG  tablet   LORazepam (ATIVAN) 0.5 MG tablet   Back pain at L4-L5 level    Will refer to Pain Clinic for further management given chronic controlled pain medication use. Continue gabapentin and muscle relaxers also      Relevant Orders   Ambulatory referral to Pain Clinic   Multiple pulmonary nodules    Will order repeat CT for screening      Relevant Orders   CT Chest Wo Contrast    Other Visit Diagnoses    Rash       Tx with nystatin cream, powder. Keep clean and dry       Follow up plan: Return in about 3 months (around 02/02/2020) for anxiety and pain f/u.

## 2019-11-06 LAB — COMPREHENSIVE METABOLIC PANEL
ALT: 15 IU/L (ref 0–32)
AST: 20 IU/L (ref 0–40)
Albumin/Globulin Ratio: 2 (ref 1.2–2.2)
Albumin: 4.4 g/dL (ref 3.7–4.7)
Alkaline Phosphatase: 44 IU/L (ref 39–117)
BUN/Creatinine Ratio: 27 (ref 12–28)
BUN: 24 mg/dL (ref 8–27)
Bilirubin Total: 0.7 mg/dL (ref 0.0–1.2)
CO2: 27 mmol/L (ref 20–29)
Calcium: 9.9 mg/dL (ref 8.7–10.3)
Chloride: 97 mmol/L (ref 96–106)
Creatinine, Ser: 0.9 mg/dL (ref 0.57–1.00)
GFR calc Af Amer: 73 mL/min/{1.73_m2} (ref >59)
GFR calc non Af Amer: 63 mL/min/{1.73_m2} (ref >59)
Globulin, Total: 2.2 g/dL (ref 1.5–4.5)
Glucose: 103 mg/dL — ABNORMAL HIGH (ref 65–99)
Potassium: 3.8 mmol/L (ref 3.5–5.2)
Sodium: 143 mmol/L (ref 134–144)
Total Protein: 6.6 g/dL (ref 6.0–8.5)

## 2019-11-06 LAB — LIPID PANEL W/O CHOL/HDL RATIO
Cholesterol, Total: 161 mg/dL (ref 100–199)
HDL: 51 mg/dL (ref >39)
LDL Chol Calc (NIH): 76 mg/dL (ref 0–99)
Triglycerides: 202 mg/dL — ABNORMAL HIGH (ref 0–149)
VLDL Cholesterol Cal: 34 mg/dL (ref 5–40)

## 2019-11-07 ENCOUNTER — Encounter: Payer: Self-pay | Admitting: Family Medicine

## 2019-11-20 NOTE — Assessment & Plan Note (Signed)
Recheck labs, continue working on lifestyle modifications and adjust as needed

## 2019-11-20 NOTE — Assessment & Plan Note (Signed)
Exacerbated by family stressors. Start daily zoloft, continue ativan regimen with close monitoring

## 2019-11-20 NOTE — Assessment & Plan Note (Signed)
Will refer to Pain Clinic for further management given chronic controlled pain medication use. Continue gabapentin and muscle relaxers also

## 2019-11-20 NOTE — Assessment & Plan Note (Signed)
BPs stable and under good control, continue current regimen 

## 2019-11-20 NOTE — Assessment & Plan Note (Signed)
Will order repeat CT for screening

## 2019-12-02 ENCOUNTER — Other Ambulatory Visit: Payer: Self-pay

## 2019-12-02 ENCOUNTER — Ambulatory Visit
Admission: RE | Admit: 2019-12-02 | Discharge: 2019-12-02 | Disposition: A | Discharge status: Home / Self Care | Payer: Medicare Other | Source: Ambulatory Visit | Attending: Family Medicine | Admitting: Family Medicine

## 2019-12-02 DIAGNOSIS — R918 Other nonspecific abnormal finding of lung field: Secondary | ICD-10-CM | POA: Diagnosis not present

## 2019-12-04 ENCOUNTER — Other Ambulatory Visit: Payer: Self-pay | Admitting: Family Medicine

## 2019-12-04 NOTE — Telephone Encounter (Signed)
Routing to provider  

## 2019-12-04 NOTE — Telephone Encounter (Signed)
Pt requesting a year's supply of SERTRALINE HCL 50MG  TABLET.

## 2019-12-12 ENCOUNTER — Ambulatory Visit: Payer: Self-pay

## 2019-12-12 ENCOUNTER — Telehealth: Payer: Self-pay | Admitting: Family Medicine

## 2019-12-12 NOTE — Telephone Encounter (Signed)
Returned call to patient.  She states that she has had ongoing diarrhea since February. She had OV 11/05/19 and was told to use extra fiber.  She called stating that it was not working.  She reports loose water yellow mucus 2-3 times per day. She has no abdominal pain.  She states she has tried rice applesauce and bananas. She takes daily antibiotic. Per protocol patient should be seen.  I tried to schedule but when I suggested that her stool may need tested she stopped and would like a call back on Monday.  She states it is difficult for her to get around and feels she would like to have a call on Monday from the office. She does not want to make 2 trips stating she can't collect stool while in the office.   Reason for Disposition . [1] MILD diarrhea (e.g., 1-3 or more stools than normal in past 24 hours) without known cause AND [2] present >  7 days  Answer Assessment - Initial Assessment Questions 1. DIARRHEA SEVERITY: "How bad is the diarrhea?" "How many extra stools have you had in the past 24 hours than normal?"    - NO DIARRHEA (SCALE 0)   - MILD (SCALE 1-3): Few loose or mushy BMs; increase of 1-3 stools over normal daily number of stools; mild increase in ostomy output.   -  MODERATE (SCALE 4-7): Increase of 4-6 stools daily over normal; moderate increase in ostomy output. * SEVERE (SCALE 8-10; OR 'WORST POSSIBLE'): Increase of 7 or more stools daily over normal; moderate increase in ostomy output; incontinence.     2-3 a day 2. ONSET: "When did the diarrhea begin?"      Since Febuary 3. BM CONSISTENCY: "How loose or watery is the diarrhea?"     Loose watery yellow mucus 4. VOMITING: "Are you also vomiting?" If so, ask: "How many times in the past 24 hours?"      no 5. ABDOMINAL PAIN: "Are you having any abdominal pain?" If yes: "What does it feel like?" (e.g., crampy, dull, intermittent, constant)     no 6. ABDOMINAL PAIN SEVERITY: If present, ask: "How bad is the pain?"  (e.g., Scale  1-10; mild, moderate, or severe)   - MILD (1-3): doesn't interfere with normal activities, abdomen soft and not tender to touch    - MODERATE (4-7): interferes with normal activities or awakens from sleep, tender to touch    - SEVERE (8-10): excruciating pain, doubled over, unable to do any normal activities      0 7. ORAL INTAKE: If vomiting, "Have you been able to drink liquids?" "How much fluids have you had in the past 24 hours?"     Crystal lite  Several glasses mountain dew 8. HYDRATION: "Any signs of dehydration?" (e.g., dry mouth [not just dry lips], too weak to stand, dizziness, new weight loss) "When did you last urinate?"    No problem 9. EXPOSURE: "Have you traveled to a foreign country recently?" "Have you been exposed to anyone with diarrhea?" "Could you have eaten any food that was spoiled?"     no 10. ANTIBIOTIC USE: "Are you taking antibiotics now or have you taken antibiotics in the past 2 months?"      Use antibiotic every day 11. OTHER SYMPTOMS: "Do you have any other symptoms?" (e.g., fever, blood in stool)      no 12. PREGNANCY: "Is there any chance you are pregnant?" "When was your last menstrual period?"  N/A  Protocols used: DIARRHEA-A-AH

## 2019-12-12 NOTE — Telephone Encounter (Signed)
error 

## 2019-12-15 NOTE — Telephone Encounter (Signed)
Looks like Tracy Espinoza is open this afternoon, can we see if patient will do virtual due to symptoms.

## 2019-12-15 NOTE — Telephone Encounter (Signed)
Lvm for pt to call back. 

## 2019-12-17 ENCOUNTER — Encounter: Payer: Self-pay | Admitting: Family Medicine

## 2019-12-17 ENCOUNTER — Other Ambulatory Visit: Payer: Self-pay

## 2019-12-17 ENCOUNTER — Ambulatory Visit (INDEPENDENT_AMBULATORY_CARE_PROVIDER_SITE_OTHER): Payer: Medicare Other | Admitting: Family Medicine

## 2019-12-17 VITALS — Ht 61.0 in

## 2019-12-17 DIAGNOSIS — R197 Diarrhea, unspecified: Secondary | ICD-10-CM

## 2019-12-17 MED ORDER — DICYCLOMINE HCL 10 MG PO CAPS: 10.0000 mg | ORAL_CAPSULE | Freq: Three times a day (TID) | ORAL | 0 refills | Status: DC

## 2019-12-17 NOTE — Progress Notes (Signed)
Ht 5\' 1"  (1.549 m)   BMI 53.47 kg/m    Subjective:    Patient ID: Tracy Espinoza, female    DOB: Jun 23, 1946, 74 y.o.   MRN: [id number]  HPI: Tracy Espinoza is a 74 y.o. female  Chief Complaint  Patient presents with  . Diarrhea    x about 2 months. has tried OTC medication. did not help  . Other    pt would like to discuss about the CT scan done on 12/02/2019    . This visit was completed via telephone due to the restrictions of the COVID-19 pandemic. All issues as above were discussed and addressed. Physical exam was done as above through visual confirmation on telephone. If it was felt that the patient should be evaluated in the office, they were directed there. The patient verbally consented to this visit. . Location of the patient: home . Location of the provider: work . Those involved with this call:  . Provider: Merrie Roof, PA-C . CMA: Lesle Chris, [place] . Front Desk/Registration: Jill Side  . Time spent on call: 20 minutes on the phone discussing health concerns. 5 minutes total spent in review of patient's record and preparation of their chart. I verified patient identity using two factors (patient name and date of birth). Patient consents verbally to being seen via telemedicine visit today.   Several bouts of diarrhea per day, triggered mostly by eating. Watery explosive stools. Has tried bland foods, fiber supplement and imodium without relief. No abdominal pain, but some bloating. This seemed to have started about 2-3 months ago. No bloody stools, fevers, medication or diet changes, recent travel hx of GI disorders. UTD on colonoscopy.  Relevant past medical, surgical, family and social history reviewed and updated as indicated. Interim medical history since our last visit reviewed. Allergies and medications reviewed and updated.  Review of Systems  Per HPI unless specifically indicated above     Objective:    Ht 5\' 1"  (1.549 m)   BMI 53.47 kg/m   Wt  Readings from Last 3 Encounters:  11/05/19 283 lb (128.4 kg)  09/29/19 285 lb (129.3 kg)  10/24/18 285 lb (129.3 kg)    Physical Exam  Unable to perform PE due to patient lack of access to video technology for today's visit  Results for orders placed or performed in visit on 11/05/19  Comprehensive metabolic panel  Result Value Ref Range   Glucose 103 (H) 65 - 99 mg/dL   BUN 24 8 - 27 mg/dL   Creatinine, Ser 0.90 0.57 - 1.00 mg/dL   GFR calc non Af Amer 63 >59 mL/min/1.73   GFR calc Af Amer 73 >59 mL/min/1.73   BUN/Creatinine Ratio 27 12 - 28   Sodium 143 134 - 144 mmol/L   Potassium 3.8 3.5 - 5.2 mmol/L   Chloride 97 96 - 106 mmol/L   CO2 27 20 - 29 mmol/L   Calcium 9.9 8.7 - 10.3 mg/dL   Total Protein 6.6 6.0 - 8.5 g/dL   Albumin 4.4 3.7 - 4.7 g/dL   Globulin, Total 2.2 1.5 - 4.5 g/dL   Albumin/Globulin Ratio 2.0 1.2 - 2.2   Bilirubin Total 0.7 0.0 - 1.2 mg/dL   Alkaline Phosphatase 44 39 - 117 IU/L   AST 20 0 - 40 IU/L   ALT 15 0 - 32 IU/L  Lipid Panel w/o Chol/HDL Ratio  Result Value Ref Range   Cholesterol, Total 161 100 - 199 mg/dL   Triglycerides  202 (H) 0 - 149 mg/dL   HDL 51 >39 mg/dL   VLDL Cholesterol Cal 34 5 - 40 mg/dL   LDL Chol Calc (NIH) 76 0 - 99 mg/dL      Assessment & Plan:   Problem List Items Addressed This Visit    None    Visit Diagnoses    Diarrhea, unspecified type    -  Primary   ?IBS vs infectious - will run stool studies and basic labs, start bentyl prn. Can try diet changes as well. GI referral if no benefit and sxs persistent   Relevant Orders   CBC with Differential/Platelet   Comprehensive metabolic panel   Stool C-Diff Toxin Assay   Fecal leukocytes   Ova and parasite examination       Follow up plan: Return in about 2 weeks (around 12/31/2019) for diarrhea f/u.

## 2019-12-19 ENCOUNTER — Telehealth: Payer: Self-pay | Admitting: Family Medicine

## 2019-12-19 NOTE — Telephone Encounter (Signed)
-----   Message from Georgina Peer, [place] sent at 12/19/2019  7:42 AM EDT -----  ----- Message ----- From: Volney American, PA-C Sent: 12/19/2019   5:19 AM EDT To: [facility]  2 weeks for diarrhea

## 2019-12-19 NOTE — Telephone Encounter (Signed)
Called pt to schedule f/u, no answer, left vm

## 2019-12-25 ENCOUNTER — Other Ambulatory Visit: Payer: Medicare Other

## 2019-12-25 ENCOUNTER — Other Ambulatory Visit: Payer: Self-pay

## 2019-12-25 DIAGNOSIS — R197 Diarrhea, unspecified: Secondary | ICD-10-CM | POA: Diagnosis not present

## 2019-12-26 ENCOUNTER — Other Ambulatory Visit: Payer: Medicare Other

## 2019-12-26 DIAGNOSIS — R197 Diarrhea, unspecified: Secondary | ICD-10-CM | POA: Diagnosis not present

## 2019-12-26 LAB — CBC WITH DIFFERENTIAL/PLATELET
Basophils Absolute: 0.1 10*3/uL (ref 0.0–0.2)
Basos: 1 %
EOS (ABSOLUTE): 0.1 10*3/uL (ref 0.0–0.4)
Eos: 1 %
Hematocrit: 46.6 % (ref 34.0–46.6)
Hemoglobin: 15 g/dL (ref 11.1–15.9)
Immature Grans (Abs): 0 10*3/uL (ref 0.0–0.1)
Immature Granulocytes: 0 %
Lymphocytes Absolute: 5.5 10*3/uL — ABNORMAL HIGH (ref 0.7–3.1)
Lymphs: 49 %
MCH: 30.9 pg (ref 26.6–33.0)
MCHC: 32.2 g/dL (ref 31.5–35.7)
MCV: 96 fL (ref 79–97)
Monocytes Absolute: 0.6 10*3/uL (ref 0.1–0.9)
Monocytes: 6 %
Neutrophils Absolute: 4.8 10*3/uL (ref 1.4–7.0)
Neutrophils: 43 %
Platelets: 180 10*3/uL (ref 150–450)
RBC: 4.85 x10E6/uL (ref 3.77–5.28)
RDW: 12.1 % (ref 11.7–15.4)
WBC: 11.1 10*3/uL — ABNORMAL HIGH (ref 3.4–10.8)

## 2019-12-26 LAB — COMPREHENSIVE METABOLIC PANEL
ALT: 17 IU/L (ref 0–32)
AST: 20 IU/L (ref 0–40)
Albumin/Globulin Ratio: 2.1 (ref 1.2–2.2)
Albumin: 4.5 g/dL (ref 3.7–4.7)
Alkaline Phosphatase: 36 IU/L — ABNORMAL LOW (ref 39–117)
BUN/Creatinine Ratio: 27 (ref 12–28)
BUN: 21 mg/dL (ref 8–27)
Bilirubin Total: 0.6 mg/dL (ref 0.0–1.2)
CO2: 27 mmol/L (ref 20–29)
Calcium: 9.7 mg/dL (ref 8.7–10.3)
Chloride: 102 mmol/L (ref 96–106)
Creatinine, Ser: 0.77 mg/dL (ref 0.57–1.00)
GFR calc Af Amer: 88 mL/min/{1.73_m2} (ref >59)
GFR calc non Af Amer: 76 mL/min/{1.73_m2} (ref >59)
Globulin, Total: 2.1 g/dL (ref 1.5–4.5)
Glucose: 97 mg/dL (ref 65–99)
Potassium: 3.8 mmol/L (ref 3.5–5.2)
Sodium: 145 mmol/L — ABNORMAL HIGH (ref 134–144)
Total Protein: 6.6 g/dL (ref 6.0–8.5)

## 2019-12-28 LAB — CLOSTRIDIUM DIFFICILE EIA: C difficile Toxins A+B, EIA: NEGATIVE

## 2019-12-29 ENCOUNTER — Other Ambulatory Visit: Payer: Self-pay | Admitting: Family Medicine

## 2019-12-29 DIAGNOSIS — R197 Diarrhea, unspecified: Secondary | ICD-10-CM

## 2019-12-29 DIAGNOSIS — D72829 Elevated white blood cell count, unspecified: Secondary | ICD-10-CM

## 2019-12-30 ENCOUNTER — Ambulatory Visit (INDEPENDENT_AMBULATORY_CARE_PROVIDER_SITE_OTHER): Payer: Medicare Other | Admitting: Vascular Surgery

## 2019-12-30 ENCOUNTER — Other Ambulatory Visit: Payer: Self-pay

## 2019-12-30 ENCOUNTER — Encounter (INDEPENDENT_AMBULATORY_CARE_PROVIDER_SITE_OTHER): Payer: Self-pay | Admitting: Vascular Surgery

## 2019-12-30 VITALS — BP 140/78 | HR 58 | Ht 61.0 in | Wt 273.0 lb

## 2019-12-30 DIAGNOSIS — Z6841 Body Mass Index (BMI) 40.0 and over, adult: Secondary | ICD-10-CM | POA: Diagnosis not present

## 2019-12-30 DIAGNOSIS — I10 Essential (primary) hypertension: Secondary | ICD-10-CM | POA: Diagnosis not present

## 2019-12-30 DIAGNOSIS — I890 Lymphedema, not elsewhere classified: Secondary | ICD-10-CM | POA: Diagnosis not present

## 2019-12-30 LAB — OVA AND PARASITE EXAMINATION

## 2019-12-30 NOTE — Assessment & Plan Note (Signed)
Her symptoms are currently well controlled with a Velcro compression system and the lymphedema pump.  Continue these regimens.  At this point I can either follow her annually or as needed if symptoms worsen.  She prefers the latter so we will make that our plan.

## 2019-12-30 NOTE — Progress Notes (Signed)
MRN : [id number]  Tracy Espinoza is a 74 y.o. (1946-06-28) female who presents with chief complaint of  Chief Complaint  Patient presents with  . Follow-up    1 yr no studies  .  History of Present Illness: Patient returns today in follow up of her leg swelling and lymphedema.  Her legs are doing quite well.  She is using the lymphedema pump for up to 90 minutes twice a day.  She has no ulceration or infection.  Her leg is really not hurting her much.  She is using the Velcro compression system and that is working well for her.  Current Outpatient Medications  Medication Sig Dispense Refill  . acetaminophen (ACETAMINOPHEN 8 HOUR) 650 MG CR tablet Take 650 mg by mouth every 8 (eight) hours as needed for pain.     Marland Kitchen aspirin EC 81 MG tablet Take 81 mg by mouth daily.    . Calcium Carb-Cholecalciferol (CALCIUM 600+D) 600-800 MG-UNIT TABS Take 1 tablet by mouth daily.     . calcium carbonate (OS-CAL - DOSED IN MG OF ELEMENTAL CALCIUM) 1250 (500 Ca) MG tablet Take by mouth.    . cetirizine (ZYRTEC) 10 MG tablet Take 10 mg by mouth at bedtime.     . Cranberry 425 MG CAPS Take by mouth.    . diclofenac sodium (VOLTAREN) 1 % GEL Apply 1 application topically 4 (four) times daily as needed (for pain).     Marland Kitchen doxycycline (VIBRAMYCIN) 100 MG capsule Take 1 capsule (100 mg total) by mouth at bedtime. 90 capsule 4  . gabapentin (NEURONTIN) 400 MG capsule Take up to 4 capsules daily as needed 360 capsule 1  . hydrochlorothiazide (HYDRODIURIL) 25 MG tablet Take 1 tablet (25 mg total) by mouth daily. 90 tablet 4  . loperamide (IMODIUM A-D) 2 MG tablet Take 2-4 mg by mouth 4 (four) times daily as needed for diarrhea or loose stools. [facility] brand    . LORazepam (ATIVAN) 0.5 MG tablet Take 1 tablet (0.5 mg total) by mouth daily. Take only for bad days 90 tablet 0  . losartan (COZAAR) 100 MG tablet Take 1 tablet (100 mg total) by mouth daily. 90 tablet 1  . metoprolol succinate (TOPROL-XL) 50 MG 24 hr tablet  Take 1 tablet (50 mg total) by mouth daily. 90 tablet 4  . Multiple Vitamin (MULTIVITAMIN) tablet Take 1 tablet by mouth daily.    . nitrofurantoin, macrocrystal-monohydrate, (MACROBID) 100 MG capsule Take 1 capsule (100 mg total) by mouth 2 (two) times daily. 14 capsule 0  . nystatin (MYCOSTATIN/NYSTOP) powder Apply 1 application topically 2 (two) times daily. 60 g 1  . nystatin cream (MYCOSTATIN) Apply 1 application topically 2 (two) times daily. 60 g 1  . Omega-3 Fatty Acids (OMEGA-3 FISH OIL PO) Take 2 capsules by mouth daily.     Marland Kitchen omeprazole (PRILOSEC) 40 MG capsule Take 1 capsule (40 mg total) by mouth daily. 90 capsule 4  . Probiotic Product ([facility] DAILY PROBIOTIC PO) Take 1 capsule by mouth daily.     . traMADol (ULTRAM) 50 MG tablet Take 1 tablet (50 mg total) by mouth 2 (two) times daily as needed. 60 tablet 0  . traZODone (DESYREL) 100 MG tablet Take 1 tablet (100 mg total) by mouth at bedtime. 90 tablet 0  . Ascorbic Acid (VITAMIN C) 1000 MG tablet Take 1,000 mg by mouth daily.    Marland Kitchen dicyclomine (BENTYL) 10 MG capsule Take 1 capsule (10 mg total) by  mouth 4 (four) times daily -  before meals and at bedtime. (Patient not taking: Reported on 12/30/2019) 120 capsule 0  . fluticasone (FLONASE) 50 MCG/ACT nasal spray Place into both nostrils daily.     . sertraline (ZOLOFT) 50 MG tablet TAKE 1 TABLET BY MOUTH  DAILY 90 tablet 0   No current facility-administered medications for this visit.    Past Medical History:  Diagnosis Date  . Anxiety   . Arthritis   . Cancer ([place])    BASAL CELL SKIN CANCERS REMOVED  . Depression   . Edema    FEET/LEGS  . GERD (gastroesophageal reflux disease)   . Heart murmur   . History of hiatal hernia   . Hypertension   . Lymphedema   . MRSA (methicillin resistant staph aureus) culture positive    H/O    Past Surgical History:  Procedure Laterality Date  . ABDOMINAL HYSTERECTOMY    . APPENDECTOMY    . BACK SURGERY    . CATARACT EXTRACTION  W/PHACO Left 03/28/2018   Procedure: CATARACT EXTRACTION PHACO AND INTRAOCULAR LENS PLACEMENT (IOC);  Surgeon: Eulogio Bear, MD;  Location: [facility];  Service: Ophthalmology;  Laterality: Left;  [place] 00:28.2 AP% 7.9 CDE 2.21 Fluid pack lot # [id number]  . CATARACT EXTRACTION W/PHACO Right 05/16/2018   Procedure: CATARACT EXTRACTION PHACO AND INTRAOCULAR LENS PLACEMENT (IOC);  Surgeon: Eulogio Bear, MD;  Location: [facility];  Service: Ophthalmology;  Laterality: Right;  [place] 00:26.2 AP% 7.6 CDE 2.01 Fluid pack lot # [id number]  . CHOLECYSTECTOMY    . COLONOSCOPY    . FRACTURE SURGERY     BONE ON SIDE OF RIGHT FOOT  . HERNIA REPAIR     UMBILICAL X 2  . JOINT REPLACEMENT Bilateral    knees  . KIDNEY SURGERY    . LUMBAR WOUND DEBRIDEMENT N/A 08/03/2016   Procedure: IRRIGATION AND DEBRIDEMENT LUMBAR WOUND;  Surgeon: Eustace Moore, MD;  Location: [facility];  Service: Neurosurgery;  Laterality: N/A;  . OVARIAN CYST SURGERY    . REPAIR OF LEFT URETER Left 40 YRS AGO  . ROTATOR CUFF REPAIR  2014  . TONSILLECTOMY    . TUBAL LIGATION      Social History   Tobacco Use  . Smoking status: Never Smoker  . Smokeless tobacco: Never Used  Substance Use Topics  . Alcohol use: Yes    Alcohol/week: 1.0 standard drinks    Types: 1 Glasses of wine per week    Comment: white wine occasionally 1 glass a week   . Drug use: No    Family History  Problem Relation Age of Onset  . Hypertension Mother   . Stroke Mother   . Alzheimer's disease Mother   . Cancer Father        brain  . Hypertension Father   . Stroke Father      Allergies  Allergen Reactions  . Tape Itching, Dermatitis and Rash  . 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 Band-Aid Plus Antibiotic [Bacitracin-Polymyxin B] Other (See Comments)    Unknown  . Iodinated Diagnostic Agents Itching  . Morphine Sulfate Itching  . Vancomycin Rash    REVIEW OF SYSTEMS(Negative unless  checked)  Constitutional: [] ?Weight loss[] ?Fever[] ?Chills Cardiac:[] ?Chest pain[] ?Chest pressure[] ?Palpitations [] ?Shortness of breath when laying flat [] ?Shortness of breath at rest [] ?Shortness of breath with exertion. Vascular: [] ?Pain in legs with walking[] ?Pain in legsat rest[] ?Pain in legs when laying flat [] ?Claudication [] ?Pain in feet  when walking [] ?Pain in feet at rest [] ?Pain in feet when laying flat [] ?History of DVT [] ?Phlebitis [x] ?Swelling in legs [x] ?Varicose veins [] ?Non-healing ulcers Pulmonary: [] ?Uses home oxygen [] ?Productive cough[] ?Hemoptysis [] ?Wheeze [] ?COPD [] ?Asthma Neurologic: [] ?Dizziness [] ?Blackouts [] ?Seizures [] ?History of stroke [] ?History of TIA[] ?Aphasia [] ?Temporary blindness[] ?Dysphagia [] ?Weaknessor numbness in arms [] ?Weakness or numbnessin legs Musculoskeletal: [x] ?Arthritis [] ?Joint swelling [x] ?Joint pain [x] ?Low back pain Hematologic:[] ?Easy bruising[] ?Easy bleeding [] ?Hypercoagulable state [] ?Anemic  Gastrointestinal:[] ?Blood in stool[] ?Vomiting blood[x] ?Gastroesophageal reflux/heartburn[] ?Abdominal pain Genitourinary: [] ?Chronic kidney disease [] ?Difficulturination [] ?Frequenturination [] ?Burning with urination[] ?Hematuria Skin: [] ?Rashes [] ?Ulcers [] ?Wounds Psychological: [] ?History of anxiety[] ?History of major depression.  Physical Examination  BP 140/78   Pulse (!) 58   Ht 5\' 1"  (1.549 m)   Wt 273 lb (123.8 kg)   BMI 51.58 kg/m  Gen:  WD/WN, NAD Head: [place]/AT, No temporalis wasting. Ear/Nose/Throat: Hearing grossly intact, nares w/o erythema or drainage Eyes: Conjunctiva clear. Sclera non-icteric Neck: Supple.  Trachea midline Pulmonary:  Good air movement, no use of accessory muscles.  Cardiac: RRR, no JVD Vascular:  Vessel Right Left  Radial Palpable Palpable               Musculoskeletal: M/S 5/5 throughout.   No deformity or atrophy.  Mild bilateral lower extremity edema. Neurologic: Sensation grossly intact in extremities.  Symmetrical.  Speech is fluent.  Psychiatric: Judgment intact, Mood & affect appropriate for pt's clinical situation. Dermatologic: No rashes or ulcers noted.  No cellulitis or open wounds.       Labs Recent Results (from the past 2160 hour(s))  Comprehensive metabolic panel     Status: Abnormal   Collection Time: 11/05/19  3:39 PM  Result Value Ref Range   Glucose 103 (H) 65 - 99 mg/dL   BUN 24 8 - 27 mg/dL   Creatinine, Ser 0.90 0.57 - 1.00 mg/dL   GFR calc non Af Amer 63 >59 mL/min/1.73   GFR calc Af Amer 73 >59 mL/min/1.73   BUN/Creatinine Ratio 27 12 - 28   Sodium 143 134 - 144 mmol/L   Potassium 3.8 3.5 - 5.2 mmol/L   Chloride 97 96 - 106 mmol/L   CO2 27 20 - 29 mmol/L   Calcium 9.9 8.7 - 10.3 mg/dL   Total Protein 6.6 6.0 - 8.5 g/dL   Albumin 4.4 3.7 - 4.7 g/dL   Globulin, Total 2.2 1.5 - 4.5 g/dL   Albumin/Globulin Ratio 2.0 1.2 - 2.2   Bilirubin Total 0.7 0.0 - 1.2 mg/dL   Alkaline Phosphatase 44 39 - 117 IU/L   AST 20 0 - 40 IU/L   ALT 15 0 - 32 IU/L  Lipid Panel w/o Chol/HDL Ratio     Status: Abnormal   Collection Time: 11/05/19  3:39 PM  Result Value Ref Range   Cholesterol, Total 161 100 - 199 mg/dL   Triglycerides 202 (H) 0 - 149 mg/dL   HDL 51 >39 mg/dL   VLDL Cholesterol Cal 34 5 - 40 mg/dL   LDL Chol Calc (NIH) 76 0 - 99 mg/dL  Comprehensive metabolic panel     Status: Abnormal   Collection Time: 12/25/19  2:50 PM  Result Value Ref Range   Glucose 97 65 - 99 mg/dL   BUN 21 8 - 27 mg/dL   Creatinine, Ser 0.77 0.57 - 1.00 mg/dL   GFR calc non Af Amer 76 >59 mL/min/1.73   GFR calc Af Amer 88 >59 mL/min/1.73   BUN/Creatinine Ratio 27 12 - 28   Sodium 145 (H) 134 - 144 mmol/L   Potassium 3.8  3.5 - 5.2 mmol/L   Chloride 102 96 - 106 mmol/L   CO2 27 20 - 29 mmol/L   Calcium 9.7 8.7 - 10.3 mg/dL   Total Protein 6.6 6.0 - 8.5 g/dL    Albumin 4.5 3.7 - 4.7 g/dL   Globulin, Total 2.1 1.5 - 4.5 g/dL   Albumin/Globulin Ratio 2.1 1.2 - 2.2   Bilirubin Total 0.6 0.0 - 1.2 mg/dL   Alkaline Phosphatase 36 (L) 39 - 117 IU/L   AST 20 0 - 40 IU/L   ALT 17 0 - 32 IU/L  CBC with Differential/Platelet     Status: Abnormal   Collection Time: 12/25/19  2:50 PM  Result Value Ref Range   WBC 11.1 (H) 3.4 - 10.8 x10E3/uL   RBC 4.85 3.77 - 5.28 x10E6/uL   Hemoglobin 15.0 11.1 - 15.9 g/dL   Hematocrit 46.6 34.0 - 46.6 %   MCV 96 79 - 97 fL   MCH 30.9 26.6 - 33.0 pg   MCHC 32.2 31.5 - 35.7 g/dL   RDW 12.1 11.7 - 15.4 %   Platelets 180 150 - 450 x10E3/uL   Neutrophils 43 Not Estab. %   Lymphs 49 Not Estab. %   Monocytes 6 Not Estab. %   Eos 1 Not Estab. %   Basos 1 Not Estab. %   Neutrophils Absolute 4.8 1.4 - 7.0 x10E3/uL   Lymphocytes Absolute 5.5 (H) 0.7 - 3.1 x10E3/uL   Monocytes Absolute 0.6 0.1 - 0.9 x10E3/uL   EOS (ABSOLUTE) 0.1 0.0 - 0.4 x10E3/uL   Basophils Absolute 0.1 0.0 - 0.2 x10E3/uL   Immature Granulocytes 0 Not Estab. %   Immature Grans (Abs) 0.0 0.0 - 0.1 x10E3/uL  Ova and parasite examination     Status: None   Collection Time: 12/26/19  3:14 PM   Specimen: Stool   ST  Result Value Ref Range   OVA + PARASITE EXAM Final report     Comment: These results were obtained using wet preparation(s) and trichrome stained smear. This test does not include testing for Cryptosporidium parvum, Cyclospora, or Microsporidia.    O&P result 1 Comment     Comment: No ova, cysts, or parasites seen. One negative specimen does not rule out the possibility of a parasitic infection.   Stool C-Diff Toxin Assay     Status: None   Collection Time: 12/26/19  3:14 PM   Specimen: Stool   ST  Result Value Ref Range   C difficile Toxins A+B, EIA Negative Negative    Radiology CT Chest Wo Contrast  Result Date: 12/02/2019 CLINICAL DATA:  Follow-up parenchymal lung nodules EXAM: CT CHEST WITHOUT CONTRAST TECHNIQUE:  Multidetector CT imaging of the chest was performed following the standard protocol without IV contrast. COMPARISON:  12/31/2017 CT, 04/03/2017 ultrasound of the abdomen FINDINGS: Cardiovascular: Somewhat limited due to lack of IV contrast. Aortic calcifications are noted. Mild coronary calcifications are seen. No cardiac enlargement is seen. The pulmonary artery as visualized is within normal limits although no contrast is administered. Mediastinum/Nodes: Thoracic inlet is within normal limits. The thyroid is unremarkable. No sizable hilar or mediastinal adenopathy is noted. Multiple calcified lymph nodes are seen consistent with prior granulomatous disease. These are stable in appearance from the prior study. The esophagus as visualized is within normal limits. Lungs/Pleura: The lungs are well aerated bilaterally. Multiple nodules are again seen within the left lung. The largest of these is best visualized on image number 102 of series 3 measuring approximately 11  mm stable in appearance from the prior exam. Two slightly less prominent nodules are noted within the left apex best seen on images 17 and 18 of series 3 also stable in appearance from the prior exam. No new focal parenchymal nodules are noted. No focal infiltrate or sizable effusion is seen. Upper Abdomen: Visualized upper abdomen again demonstrates vague hypodensities within the spleen decreased in size when compared with the prior exam. These again likely represent mildly complicated cysts given findings on prior ultrasounds. Gallbladder has been removed. Previously seen hepatic hypodensity along the medial aspect of the right lobe of the liver inferiorly as well as within the medial segment of the left lobe of the liver again likely represent cysts. Musculoskeletal: Degenerative changes of the thoracic spine are noted. No acute bony abnormality is seen. IMPRESSION: Changes of prior granulomatous disease. Stable nodules primarily within the left lung  unchanged from the prior exam as well as previous CT examinations dating back to 2018. Given their long-term stability they are felt to be benign in etiology. No further follow-up is recommended. Stable 2 hypodensities within the spleen consistent with complicated cysts slightly smaller when compare with the prior exam. Aortic Atherosclerosis (ICD10-I70.0). Electronically Signed   By: Inez Catalina M.D.   On: 12/02/2019 20:05    Assessment/Plan Hypertension blood pressure control important in reducing the progression of atherosclerotic disease. On appropriate oral medications.   BMI [id number], adult Certainly worsens lower extremity swelling  Lymphedema Her symptoms are currently well controlled with a Velcro compression system and the lymphedema pump.  Continue these regimens.  At this point I can either follow her annually or as needed if symptoms worsen.  She prefers the latter so we will make that our plan.    Leotis Pain, MD  12/30/2019 2:25 PM    This note was created with Dragon medical transcription system.  Any errors from dictation are purely unintentional

## 2019-12-31 LAB — FECAL OCCULT BLOOD, IMMUNOCHEMICAL: Fecal Occult Bld: NEGATIVE

## 2020-01-01 LAB — FECAL LEUKOCYTES

## 2020-01-05 ENCOUNTER — Other Ambulatory Visit: Payer: Self-pay

## 2020-01-05 ENCOUNTER — Ambulatory Visit (INDEPENDENT_AMBULATORY_CARE_PROVIDER_SITE_OTHER): Payer: Medicare Other | Admitting: Family Medicine

## 2020-01-05 ENCOUNTER — Encounter: Payer: Self-pay | Admitting: Family Medicine

## 2020-01-05 VITALS — BP 135/77 | HR 62 | Temp 98.0°F | Wt 278.0 lb

## 2020-01-05 DIAGNOSIS — R197 Diarrhea, unspecified: Secondary | ICD-10-CM | POA: Diagnosis not present

## 2020-01-05 MED ORDER — DICYCLOMINE HCL 10 MG PO CAPS: 10.0000 mg | ORAL_CAPSULE | Freq: Three times a day (TID) | ORAL | 1 refills | Status: DC

## 2020-01-05 NOTE — Progress Notes (Signed)
   BP 135/77 Comment: left lower arm  Pulse 62   Temp 98 F (36.7 C) (Oral)   Wt 278 lb (126.1 kg)   SpO2 94%   BMI 52.53 kg/m    Subjective:    Patient ID: Tracy Espinoza, female    DOB: 01-Feb-1946, 74 y.o.   MRN: [id number]  HPI: Tracy Espinoza is a 74 y.o. female  Chief Complaint  Patient presents with  . Diarrhea   Patient presenting today for diarrhea f/u. Bentyl decreased the urgency some but still having several accidents from fecal incontinence daily. Only solution she's found thus far is avoiding eating anything. Has tried cutting out meats, dairy, gluten, etc all without noticeable benefit. Denies fever, chills, weight loss, syncope, dizziness, abdominal pain. UTD on colonoscopy  Relevant past medical, surgical, family and social history reviewed and updated as indicated. Interim medical history since our last visit reviewed. Allergies and medications reviewed and updated.  Review of Systems  Per HPI unless specifically indicated above     Objective:    BP 135/77 Comment: left lower arm  Pulse 62   Temp 98 F (36.7 C) (Oral)   Wt 278 lb (126.1 kg)   SpO2 94%   BMI 52.53 kg/m   Wt Readings from Last 3 Encounters:  01/05/20 278 lb (126.1 kg)  12/30/19 273 lb (123.8 kg)  11/05/19 283 lb (128.4 kg)    Physical Exam Vitals and nursing note reviewed.  Constitutional:      Appearance: Normal appearance. She is not ill-appearing.  HENT:     Head: Atraumatic.  Eyes:     Extraocular Movements: Extraocular movements intact.     Conjunctiva/sclera: Conjunctivae normal.  Cardiovascular:     Rate and Rhythm: Normal rate and regular rhythm.     Heart sounds: Normal heart sounds.  Pulmonary:     Effort: Pulmonary effort is normal.     Breath sounds: Normal breath sounds.  Abdominal:     General: Bowel sounds are normal. There is no distension.     Palpations: Abdomen is soft.     Tenderness: There is no abdominal tenderness. There is no right CVA tenderness, left  CVA tenderness or guarding.  Musculoskeletal:        General: Normal range of motion.     Cervical back: Normal range of motion and neck supple.  Skin:    General: Skin is warm and dry.  Neurological:     Mental Status: She is alert and oriented to person, place, and time.  Psychiatric:        Mood and Affect: Mood normal.        Thought Content: Thought content normal.        Judgment: Judgment normal.     Results for orders placed or performed in visit on 12/29/19  Fecal occult blood, imunochemical([facility])   ST  Result Value Ref Range   Fecal Occult Bld Negative Negative      Assessment & Plan:   Problem List Items Addressed This Visit    None    Visit Diagnoses    Diarrhea, unspecified type    -  Primary   Continue bentyl, prn imodium and push fluids. Refer to GI as no obvious etiology found and becoming quality of life issue for her   Relevant Orders   Ambulatory referral to Gastroenterology       Follow up plan: Return for as scheduled.

## 2020-01-13 ENCOUNTER — Encounter: Payer: Self-pay | Admitting: Gastroenterology

## 2020-01-14 ENCOUNTER — Encounter: Payer: Self-pay | Admitting: Gastroenterology

## 2020-01-14 ENCOUNTER — Ambulatory Visit (INDEPENDENT_AMBULATORY_CARE_PROVIDER_SITE_OTHER): Payer: Medicare Other | Admitting: Gastroenterology

## 2020-01-14 DIAGNOSIS — R197 Diarrhea, unspecified: Secondary | ICD-10-CM | POA: Diagnosis not present

## 2020-01-14 DIAGNOSIS — K909 Intestinal malabsorption, unspecified: Secondary | ICD-10-CM | POA: Diagnosis not present

## 2020-01-14 DIAGNOSIS — R159 Full incontinence of feces: Secondary | ICD-10-CM

## 2020-01-14 MED ORDER — METAMUCIL 28 % PO PACK: 1.0000 | PACK | Freq: Every day | ORAL | 2 refills | Status: AC

## 2020-01-14 NOTE — Patient Instructions (Signed)
Lactose-Free Diet, Adult If you have lactose intolerance, you are not able to digest lactose. Lactose is a natural sugar found mainly in dairy milk and dairy products. You may need to avoid all foods and beverages that contain lactose. A lactose-free diet can help you do this. Which foods have lactose? Lactose is found in dairy milk and dairy products, such as:  Yogurt.  Cheese.  Butter.  Margarine.  Sour cream.  Cream.  Whipped toppings and nondairy creamers.  Ice cream and other dairy-based desserts. Lactose is also found in foods or products made with dairy milk or milk ingredients. To find out whether a food contains dairy milk or a milk ingredient, look at the ingredients list. Avoid foods with the statement "May contain milk" and foods that contain:  Milk powder.  Whey.  Curd.  Caseinate.  Lactose.  Lactalbumin.  Lactoglobulin. What are alternatives to dairy milk and foods made with milk products?  Lactose-free milk.  Soy milk with added calcium and vitamin D.  Almond milk, coconut milk, rice milk, or other nondairy milk alternatives with added calcium and vitamin D. Note that these are low in protein.  Soy products, such as soy yogurt, soy cheese, soy ice cream, and soy-based sour cream.  Other nut milk products, such as almond yogurt, almond cheese, cashew yogurt, cashew cheese, cashew ice cream, coconut yogurt, and coconut ice cream. What are tips for following this plan?  Do not consume foods, beverages, vitamins, minerals, or medicines containing lactose. Read ingredient lists carefully.  Look for the words "lactose-free" on labels.  Use lactase enzyme drops or tablets as directed by your health care provider.  Use lactose-free milk or a milk alternative, such as soy milk or almond milk, for drinking and cooking.  Make sure you get enough calcium and vitamin D in your diet. A lactose-free eating plan can be lacking in these important  nutrients.  Take calcium and vitamin D supplements as directed by your health care provider. Talk to your health care provider about supplements if you are not able to get enough calcium and vitamin D from food. What foods can I eat?  Fruits All fresh, canned, frozen, or dried fruits that are not processed with lactose. Vegetables All fresh, frozen, and canned vegetables without cheese, cream, or butter sauces. Grains Any that are not made with dairy milk or dairy products. Meats and other proteins Any meat, fish, poultry, and other protein sources that are not made with dairy milk or dairy products. Soy cheese and yogurt. Fats and oils Any that are not made with dairy milk or dairy products. Beverages Lactose-free milk. Soy, rice, or almond milk with added calcium and vitamin D. Fruit and vegetable juices. Sweets and desserts Any that are not made with dairy milk or dairy products. Seasonings and condiments Any that are not made with dairy milk or dairy products. Calcium Calcium is found in many foods that contain lactose and is important for bone health. The amount of calcium you need depends on your age:  Adults younger than 50 years: 1,000 mg of calcium a day.  Adults older than 50 years: 1,200 mg of calcium a day. If you are not getting enough calcium, you may get it from other sources, including:  Orange juice with calcium added. There are 300-350 mg of calcium in 1 cup of orange juice.  Calcium-fortified soy milk. There are 300-400 mg of calcium in 1 cup of calcium-fortified soy milk.  Calcium-fortified rice or almond milk.   There are 300 mg of calcium in 1 cup of calcium-fortified rice or almond milk.  Calcium-fortified breakfast cereals. There are 100-1,000 mg of calcium in calcium-fortified breakfast cereals.  Spinach, cooked. There are 145 mg of calcium in  cup of cooked spinach.  Edamame, cooked. There are 130 mg of calcium in  cup of cooked edamame.  Collard  greens, cooked. There are 125 mg of calcium in  cup of cooked collard greens.  Kale, frozen or cooked. There are 90 mg of calcium in  cup of cooked or frozen kale.  Almonds. There are 95 mg of calcium in  cup of almonds.  Broccoli, cooked. There are 60 mg of calcium in 1 cup of cooked broccoli. The items listed above may not be a complete list of recommended foods and beverages. Contact a dietitian for more options. What foods are not recommended? Fruits None, unless they are made with dairy milk or dairy products. Vegetables None, unless they are made with dairy milk or dairy products. Grains Any grains that are made with dairy milk or dairy products. Meats and other proteins None, unless they are made with dairy milk or dairy products. Dairy All dairy products, including milk, goat's milk, buttermilk, kefir, acidophilus milk, flavored milk, evaporated milk, condensed milk, dulce de leche, eggnog, yogurt, cheese, and cheese spreads. Fats and oils Any that are made with milk or milk products. Margarines and salad dressings that contain milk or cheese. Cream. Half and half. Cream cheese. Sour cream. Chip dips made with sour cream or yogurt. Beverages Hot chocolate. Cocoa with lactose. Instant iced teas. Powdered fruit drinks. Smoothies made with dairy milk or yogurt. Sweets and desserts Any that are made with milk or milk products. Seasonings and condiments Chewing gum that has lactose. Spice blends if they contain lactose. Artificial sweeteners that contain lactose. Nondairy creamers. The items listed above may not be a complete list of foods and beverages to avoid. Contact a dietitian for more information. Summary  If you are lactose intolerant, it means that you have a hard time digesting lactose, a natural sugar found in milk and milk products.  Following a lactose-free diet can help you manage this condition.  Calcium is important for bone health and is found in many foods  that contain lactose. Talk with your health care provider about other sources of calcium. This information is not intended to replace advice given to you by your health care provider. Make sure you discuss any questions you have with your health care provider. Document Revised: 10/16/2017 Document Reviewed: 10/16/2017 Elsevier Patient Education  2020 [employer] Inc[employer]  

## 2020-01-14 NOTE — Addendum Note (Signed)
Addended by: Ulyess Blossom L on: 01/14/2020 01:14 PM   Modules accepted: Orders

## 2020-01-14 NOTE — Progress Notes (Signed)
Tracy Espinoza, [place] Espino[place] [id number]  Main: [phone]  Fax: [phone]   Gastroenterology Consultation  Referring Provider:     Volney American,* Primary Care Physician:  Guadalupe Maple, MD Reason for Consultation:     Diarrhea        HPI:   Virtual Visit via Video Note  I connected with patient on 01/14/20 at 11:15 AM EDT by video ([email or web address]) and verified that I am speaking with the correct person using two identifiers.   I discussed the limitations, risks, security and privacy concerns of performing an evaluation and management service by video and the availability of in person appointments. I also discussed with the patient that there may be a patient responsible charge related to this service. The patient expressed understanding and agreed to proceed.  Location of the patient: Home Location of provider: Home Participating persons: Patient and provider only (Nursing staff checked in patient via phone but were not physically involved in the video interaction - see their notes)   History of Present Illness: Chief Complaint  Patient presents with  . New Patient (Initial Visit)  . Diarrhea    Patient states she has abdominal pain with bowel movements. Patient states if she eats meat and anything spicy she has mild abominal pain. She has diarrhea 3-5 times a day     Tracy Espinoza is a 74 y.o. y/o female referred for consultation & management  by Dr. Jeananne Rama, Jeannette How, MD.  Patient reports diarrhea for about 4 months.  4-5 loose bowel movements a day.  No blood.  Also has previous history of bladder incontinence and has been having fecal incontinence as well.  No weight loss.  No abdominal pain.  No nausea or vomiting.  No dysphagia.  Denies any diet or medication changes.  Takes omeprazole 80 mg daily for acid reflux for years.  Drinks diet [employer] daily and uses [employer].  Occasional caffeine intake.  2015  colonoscopy showed diverticulosis  Past Medical History:  Diagnosis Date  . Anxiety   . Arthritis   . Cancer ([place])    BASAL CELL SKIN CANCERS REMOVED  . Depression   . Edema    FEET/LEGS  . GERD (gastroesophageal reflux disease)   . Heart murmur   . History of hiatal hernia   . Hypertension   . Lymphedema   . MRSA (methicillin resistant staph aureus) culture positive    H/O    Past Surgical History:  Procedure Laterality Date  . ABDOMINAL HYSTERECTOMY    . APPENDECTOMY    . BACK SURGERY    . CATARACT EXTRACTION W/PHACO Left 03/28/2018   Procedure: CATARACT EXTRACTION PHACO AND INTRAOCULAR LENS PLACEMENT (IOC);  Surgeon: Eulogio Bear, MD;  Location: [facility] ORS;  Service: Ophthalmology;  Laterality: Left;  [place] 00:28.2 AP% 7.9 CDE 2.21 Fluid pack lot # [id number]  . CATARACT EXTRACTION W/PHACO Right 05/16/2018   Procedure: CATARACT EXTRACTION PHACO AND INTRAOCULAR LENS PLACEMENT (IOC);  Surgeon: Eulogio Bear, MD;  Location: [facility];  Service: Ophthalmology;  Laterality: Right;  [place] 00:26.2 AP% 7.6 CDE 2.01 Fluid pack lot # [id number]  . CHOLECYSTECTOMY    . COLONOSCOPY    . FRACTURE SURGERY     BONE ON SIDE OF RIGHT FOOT  . HERNIA REPAIR     UMBILICAL X 2  . JOINT REPLACEMENT Bilateral    knees  . KIDNEY SURGERY    . LUMBAR WOUND  DEBRIDEMENT N/A 08/03/2016   Procedure: IRRIGATION AND DEBRIDEMENT LUMBAR WOUND;  Surgeon: Eustace Moore, MD;  Location: [facility];  Service: Neurosurgery;  Laterality: N/A;  . OVARIAN CYST SURGERY    . REPAIR OF LEFT URETER Left 40 YRS AGO  . ROTATOR CUFF REPAIR  2014  . TONSILLECTOMY    . TUBAL LIGATION      Prior to Admission medications   Medication Sig Start Date End Date Taking? Authorizing Provider  acetaminophen (ACETAMINOPHEN 8 HOUR) 650 MG CR tablet Take 650 mg by mouth every 8 (eight) hours as needed for pain.    Yes [provider]  Ascorbic Acid (VITAMIN C) 1000 MG tablet Take 1,000 mg by mouth daily.   Yes [provider]  aspirin EC 81 MG tablet Take 81 mg by mouth daily.   Yes [provider]  Calcium Carb-Cholecalciferol (CALCIUM 600+D) 600-800 MG-UNIT TABS Take 1 tablet by mouth daily.    Yes [provider]  calcium carbonate (OS-CAL - DOSED IN MG OF ELEMENTAL CALCIUM) 1250 (500 Ca) MG tablet Take by mouth.   Yes [provider]  Cranberry 425 MG CAPS Take by mouth.   Yes [provider]  dicyclomine (BENTYL) 10 MG capsule Take 1 capsule (10 mg total) by mouth 4 (four) times daily -  before meals and at bedtime. 01/05/20  Yes Volney American, PA-C  doxycycline (VIBRAMYCIN) 100 MG capsule Take 1 capsule (100 mg total) by mouth at bedtime. 04/30/19  Yes Guadalupe Maple, MD  gabapentin (NEURONTIN) 400 MG capsule Take up to 4 capsules daily as needed 11/05/19  Yes Volney American, PA-C  hydrochlorothiazide (HYDRODIURIL) 25 MG tablet Take 1 tablet (25 mg total) by mouth daily. 11/05/19  Yes Volney American, PA-C  LORazepam (ATIVAN) 0.5 MG tablet Take 1 tablet (0.5 mg total) by mouth daily. Take only for bad days 11/05/19  Yes Volney American, PA-C  losartan (COZAAR) 100 MG tablet Take 1 tablet (100 mg total) by mouth daily. 11/05/19  Yes Volney American, PA-C  metoprolol succinate (TOPROL-XL) 50 MG 24 hr tablet Take 1 tablet (50 mg total) by mouth daily. 11/05/19  Yes Volney American, PA-C  Multiple Vitamin (MULTIVITAMIN) tablet Take 1 tablet by mouth daily.   Yes [provider]  nystatin (MYCOSTATIN/NYSTOP) powder Apply 1 application topically 2 (two) times daily. 11/05/19  Yes Volney American, PA-C  nystatin cream (MYCOSTATIN) Apply 1 application topically 2 (two) times daily. 11/05/19  Yes Volney American, PA-C  Omega-3 Fatty Acids (OMEGA-3 FISH OIL PO) Take 2 capsules by mouth daily.    Yes [provider]  omeprazole (PRILOSEC) 40 MG capsule Take 1 capsule (40 mg total) by mouth daily. 11/05/19  Yes Volney American, PA-C  Probiotic Product ([facility] DAILY PROBIOTIC PO) Take 1 capsule by mouth daily.    Yes [provider]  sertraline (ZOLOFT) 50 MG tablet TAKE 1 TABLET BY MOUTH  DAILY 12/04/19  Yes Volney American, PA-C  traMADol (ULTRAM) 50 MG tablet Take 1 tablet (50 mg total) by mouth 2 (two) times daily as needed. 10/31/19  Yes Volney American, PA-C  traZODone (DESYREL) 100 MG tablet Take 1 tablet (100 mg total) by mouth at bedtime. 11/05/19  Yes Volney American, PA-C  psyllium (METAMUCIL) 28 % packet Take 1 packet by mouth daily. 01/14/20   Virgel Manifold, MD    Family History  Problem Relation Age of Onset  .  Hypertension Mother   . Stroke Mother   . Alzheimer's disease Mother   . Cancer Father        brain  . Hypertension Father   . Stroke Father      Social History   Tobacco Use  . Smoking status: Never Smoker  . Smokeless tobacco: Never Used  Substance Use Topics  . Alcohol use: Yes    Alcohol/week: 5.0 standard drinks    Types: 5 Glasses of wine per week    Comment: white wine occasionally 1 glass a day   . Drug use: No    Allergies as of 01/14/2020 - Review Complete 01/14/2020  Allergen Reaction Noted  . Tape Itching, Dermatitis, and Rash 11/10/2015  . Daptomycin Other (See Comments) 08/21/2016  . Band-aid plus antibiotic [bacitracin-polymyxin b] Other (See Comments) 08/06/2015  . Iodinated diagnostic agents Itching 10/08/2015  . Morphine sulfate Itching 01/22/2015  . Vancomycin Rash 08/03/2016    Review of Systems:    All systems reviewed and negative except where noted in HPI.   Observations/Objective:  Labs: CBC    Component Value Date/Time   WBC 11.1 (H) 12/25/2019 1450   WBC 8.7 08/22/2016 0543   RBC 4.85 12/25/2019 1450   RBC 3.15 (L) 08/22/2016 0543   HGB 15.0 12/25/2019 1450   HCT 46.6 12/25/2019 1450   PLT 180 12/25/2019 1450   MCV 96 12/25/2019 1450   MCV 89 10/23/2012 1520   MCH 30.9 12/25/2019 1450    MCH 28.4 08/22/2016 0543   MCHC 32.2 12/25/2019 1450   MCHC 32.4 08/22/2016 0543   RDW 12.1 12/25/2019 1450   RDW 13.4 10/23/2012 1520   LYMPHSABS 5.5 (H) 12/25/2019 1450   MONOABS 1.0 (H) 08/19/2016 0055   EOSABS 0.1 12/25/2019 1450   BASOSABS 0.1 12/25/2019 1450   CMP     Component Value Date/Time   NA 145 (H) 12/25/2019 1450   NA 138 10/23/2012 1520   K 3.8 12/25/2019 1450   K 3.9 10/23/2012 1520   CL 102 12/25/2019 1450   CL 101 10/23/2012 1520   CO2 27 12/25/2019 1450   CO2 32 10/23/2012 1520   GLUCOSE 97 12/25/2019 1450   GLUCOSE 110 (H) 08/22/2016 0543   GLUCOSE 101 (H) 10/23/2012 1520   BUN 21 12/25/2019 1450   BUN 28 (H) 10/23/2012 1520   CREATININE 0.77 12/25/2019 1450   CREATININE 0.81 10/23/2012 1520   CALCIUM 9.7 12/25/2019 1450   CALCIUM 9.8 10/23/2012 1520   PROT 6.6 12/25/2019 1450   ALBUMIN 4.5 12/25/2019 1450   AST 20 12/25/2019 1450   AST 28 10/24/2018 1136   ALT 17 12/25/2019 1450   ALT 21 10/24/2018 1136   ALKPHOS 36 (L) 12/25/2019 1450   BILITOT 0.6 12/25/2019 1450   GFRNONAA 76 12/25/2019 1450   GFRNONAA >60 10/23/2012 1520   GFRAA 88 12/25/2019 1450   GFRAA >60 10/23/2012 1520    Imaging Studies: No results found.  Assessment and Plan:   RAUL BROADUS is a 74 y.o. y/o female has been referred for diarrhea  Assessment and Plan: Given that she has bladder incontinence, has history of 3 vaginal deliveries, some of her diarrhea symptoms can be attributed to fecal incontinence from pelvic floor muscle weakness  Patient agreeable to pelvic floor physical therapy referral  Infectious work-up done by primary care provider was negative  We discussed decreasing omeprazole as that can attribute to diarrhea as well.  Decreased to omeprazole 40 mg once daily instead  of the 80 mg once daily that she has been taking  Patient advised to avoid foods or beverages with sugar or sweeteners including the diet [employer] and [employer] that she has  been using  Patient advised to avoid all dairy products for 2 weeks to see if this helps improve her symptoms as well  Test for celiac disease and fecal pancreatic elastase  If above measures do not lead to improvement in symptoms, consider colonoscopy to evaluate for microscopic colitis.  Patient states she would like to put the colonoscopy scheduling at the back burner for now and try the above measures  Metamucil to help bulk stool   Imodium as needed on days that she has to be out of the house, only up to 4 times that day  Follow Up Instructions:   I discussed the assessment and treatment plan with the patient. The patient was provided an opportunity to ask questions and all were answered. The patient agreed with the plan and demonstrated an understanding of the instructions.   The patient was advised to call back or seek an in-person evaluation if the symptoms worsen or if the condition fails to improve as anticipated.  I provided 20 minutes of face-to-face time via video software during this encounter.  Additional time was spent in reviewing patient's chart, placing orders etc.   Virgel Manifold, MD  Speech recognition software was used to dictate the above note.

## 2020-01-22 DIAGNOSIS — R197 Diarrhea, unspecified: Secondary | ICD-10-CM | POA: Diagnosis not present

## 2020-01-22 DIAGNOSIS — K909 Intestinal malabsorption, unspecified: Secondary | ICD-10-CM | POA: Diagnosis not present

## 2020-01-28 ENCOUNTER — Telehealth: Payer: Self-pay

## 2020-01-28 LAB — CELIAC DISEASE AB SCREEN W/RFX
Antigliadin Abs, IgA: 4 units (ref 0–19)
IgA/Immunoglobulin A, Serum: 230 mg/dL (ref 64–422)
Transglutaminase IgA: <2 U/mL (ref 0–3)

## 2020-01-28 LAB — PANCREATIC ELASTASE, FECAL: Pancreatic Elastase, Fecal: 461 ug Elast./g (ref >200)

## 2020-01-28 NOTE — Telephone Encounter (Signed)
Called patient and  She verbalized understanding

## 2020-01-28 NOTE — Telephone Encounter (Signed)
-----   Message from Virgel Manifold, MD sent at 01/28/2020  2:31 PM EDT ----- Caryl Pina please let the patient know, her stool test and labwork was normal.

## 2020-02-04 ENCOUNTER — Ambulatory Visit: Payer: No Typology Code available for payment source | Admitting: Family Medicine

## 2020-02-04 ENCOUNTER — Other Ambulatory Visit: Payer: Self-pay

## 2020-02-04 ENCOUNTER — Other Ambulatory Visit: Payer: Self-pay | Admitting: Family Medicine

## 2020-02-04 ENCOUNTER — Ambulatory Visit: Payer: Medicare Other | Attending: Gastroenterology

## 2020-02-04 DIAGNOSIS — M4125 Other idiopathic scoliosis, thoracolumbar region: Secondary | ICD-10-CM | POA: Diagnosis not present

## 2020-02-04 DIAGNOSIS — R278 Other lack of coordination: Secondary | ICD-10-CM | POA: Insufficient documentation

## 2020-02-04 DIAGNOSIS — M62838 Other muscle spasm: Secondary | ICD-10-CM | POA: Diagnosis not present

## 2020-02-04 DIAGNOSIS — M533 Sacrococcygeal disorders, not elsewhere classified: Secondary | ICD-10-CM | POA: Insufficient documentation
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
## 2020-02-04 NOTE — Telephone Encounter (Signed)
Requested Prescriptions  Pending Prescriptions Disp Refills  . sertraline (ZOLOFT) 50 MG tablet [Pharmacy Med Name: SERTRALINE HCL 50MG  TABLET] 90 tablet 1    Sig: TAKE 1 TABLET BY MOUTH  DAILY     Psychiatry:  Antidepressants - SSRI Passed - 02/04/2020  5:20 AM      Passed - Valid encounter within last 6 months    Recent Outpatient Visits          1 month ago Diarrhea, unspecified type   [facility] [place], [place]   1 month ago Diarrhea, unspecified type   [facility], Lilia Argue, [place]   3 months ago Essential hypertension   [place], [place], [place]   4 months ago Acute lower UTI   [facility], [place], [place]   5 months ago [place], Barb Merino, DO      Future Appointments            In 5 days Orene Desanctis, Lilia [place], [place], [place]   In 1 month Virgel Manifold, MD [place]

## 2020-02-06 ENCOUNTER — Other Ambulatory Visit: Payer: Self-pay | Admitting: Family Medicine

## 2020-02-09 ENCOUNTER — Encounter: Payer: Self-pay | Admitting: Family Medicine

## 2020-02-09 ENCOUNTER — Ambulatory Visit (INDEPENDENT_AMBULATORY_CARE_PROVIDER_SITE_OTHER): Payer: Medicare Other | Admitting: Family Medicine

## 2020-02-09 ENCOUNTER — Other Ambulatory Visit: Payer: Self-pay

## 2020-02-09 VITALS — BP 159/74 | HR 65 | Temp 98.0°F | Ht 61.0 in | Wt 274.0 lb

## 2020-02-09 DIAGNOSIS — R197 Diarrhea, unspecified: Secondary | ICD-10-CM

## 2020-02-09 MED ORDER — DICYCLOMINE HCL 10 MG PO CAPS: 10.0000 mg | ORAL_CAPSULE | Freq: Three times a day (TID) | ORAL | 1 refills | Status: DC

## 2020-02-09 NOTE — Progress Notes (Signed)
BP (!) 159/74   Pulse 65   Temp 98 F (36.7 C)   Ht 5\' 1"  (1.549 m)   Wt 274 lb (124.3 kg)   SpO2 92%   BMI 51.77 kg/m    Subjective:    Patient ID: Tracy Espinoza, female    DOB: December 11, 1945, 74 y.o.   MRN: [id number]  HPI: Tracy Espinoza is a 74 y.o. female  Chief Complaint  Patient presents with  . Diarrhea   Here today for diarrhea follow up. Has started metamucil, cut back on dairy and trying to cut back on sodas. Was recommended to see a PT to work on pelvic floor exercises which she is now doing once to twice per week. So far having mild relief as far as liquid stools but still having poor control. Following with GI now for this. Does feel like the bentyl helped some and wanting to continue it prn. Denies fever, chills, blood in stools, significant abdominal pain.   Relevant past medical, surgical, family and social history reviewed and updated as indicated. Interim medical history since our last visit reviewed. Allergies and medications reviewed and updated.  Review of Systems  Per HPI unless specifically indicated above     Objective:    BP (!) 159/74   Pulse 65   Temp 98 F (36.7 C)   Ht 5\' 1"  (1.549 m)   Wt 274 lb (124.3 kg)   SpO2 92%   BMI 51.77 kg/m   Wt Readings from Last 3 Encounters:  02/09/20 274 lb (124.3 kg)  01/05/20 278 lb (126.1 kg)  12/30/19 273 lb (123.8 kg)    Physical Exam Vitals and nursing note reviewed.  Constitutional:      Appearance: Normal appearance. She is not ill-appearing.  HENT:     Head: Atraumatic.  Eyes:     Extraocular Movements: Extraocular movements intact.     Conjunctiva/sclera: Conjunctivae normal.  Cardiovascular:     Rate and Rhythm: Normal rate and regular rhythm.     Heart sounds: Normal heart sounds.  Pulmonary:     Effort: Pulmonary effort is normal.     Breath sounds: Normal breath sounds.  Abdominal:     General: Bowel sounds are normal. There is no distension.     Palpations: Abdomen is soft.   Tenderness: There is no abdominal tenderness. There is no guarding.  Musculoskeletal:        General: Normal range of motion.     Cervical back: Normal range of motion and neck supple.  Skin:    General: Skin is warm and dry.  Neurological:     Mental Status: She is alert and oriented to person, place, and time.  Psychiatric:        Mood and Affect: Mood normal.        Thought Content: Thought content normal.        Judgment: Judgment normal.     Results for orders placed or performed in visit on 01/14/20  Pancreatic elastase, fecal  Result Value Ref Range   Pancreatic Elastase, Fecal 461 >200 ug Elast./g  Celiac Disease Ab Screen w/Rfx  Result Value Ref Range   Antigliadin Abs, IgA 4 0 - 19 units   Transglutaminase IgA <2 0 - 3 U/mL   IgA/Immunoglobulin A, Serum 230 64 - 422 mg/dL      Assessment & Plan:   Problem List Items Addressed This Visit    None    Visit Diagnoses  Diarrhea, unspecified type    -  Primary   Mild improvement with bentyl, fiber, and pelvic floor PT. Continue current regimen with close monitoring through GI       Follow up plan: Return for as scheduled.

## 2020-02-11 ENCOUNTER — Ambulatory Visit: Payer: Medicare Other

## 2020-02-11 ENCOUNTER — Other Ambulatory Visit: Payer: Self-pay

## 2020-02-11 DIAGNOSIS — M62838 Other muscle spasm: Secondary | ICD-10-CM | POA: Diagnosis not present

## 2020-02-11 DIAGNOSIS — R278 Other lack of coordination: Secondary | ICD-10-CM

## 2020-02-11 DIAGNOSIS — M533 Sacrococcygeal disorders, not elsewhere classified: Secondary | ICD-10-CM

## 2020-02-11 DIAGNOSIS — M4125 Other idiopathic scoliosis, thoracolumbar region: Secondary | ICD-10-CM | POA: Diagnosis not present

## 2020-02-11 NOTE — Therapy (Signed)
[place] MAIN [facility] SERVICES [place], [place], [address] Phone: [phone]   Fax:  [phone]  Physical Therapy Treatment  Pelvic Floor Physical Therapy Treatment Note  SCREENING  Changes in medications, allergies, or medical history?: none    SUBJECTIVE  Patient reports: She had issue filling out the sexual activity portion of the FOTO. Drinking 1 diet Mtn. Dew and 1 diet caffiene free Mtn. Dew daily. Loves coffee but feels like it increases her diarrhea and reflux. Drinks Crystal light the rest of the day, will drink a glass of wine occasionally if she "feels inclined". Has Diarrhea every time she eats and occasionally even if she had not.   Precautions:  Abdominal hysterectomy, Lymphedema (diagnosed ~ 3 years ago) (needs to be wearing pressure boots) L4-5 fusion in 2017.  Location of pain: LB, Hips, pelvis Current pain: 0/10  Max pain: 6/10 Least pain: 0/10 Nature of pain:"grinding" sore/achy  Patient Goals: Would like to get a handle on her diarrhea so it is not so liquid and she can control it better.  Would like to be able to go out to shop or go to church without fear of FI. Not have to change clothes more than 1 time per day due to leakage.    OBJECTIVE  Changes in: Posture/Observations:  Hyperlordosis/kyphosis, FHP, R anterior/L posterior pelvic obliquity and R thoracic scoliosis.  Range of Motion/Flexibilty:    Strength/MMT:  LE MMT: Pt. Able to stand from sitting with Max to MOD (with practice) assist when using exhale and no arm-rest.  Pelvic floor: Pt. Unable to isolate PFM contraction, using low back and rectus abdominus to try "stopping the flow"  Abdominal:  10 finger diastasis, narrows to 8 with exhale/posterior pelvic tilt.  Palpation:  Gait Analysis:  INTERVENTIONS THIS SESSION: Theract: educated on and practiced exhaling with supine-to-sit, sit-to-stand, and bed mobility to decrease  valsalva and protect POP/ decrease FI and SUI.   NM re-ed: educated on and practiced diaphragmatic breathing and coordination of pelvic tilt with breathing in sitting and hook-lying. Used Exhale with pursed lips to encourage TA and PFM  recruitment and decrease diastasis recti.  Self-care: educated on fight-or-flight vs. Rest-and-digest modes and the role of diaphragmatic breathing in helping prevent loss of bowel/bladdre contents by decreasing fight-or-flight response.  Total time: 36   Patient Details  Name: Tracy Espinoza MRN: [id number] Date of Birth: 1946-03-24 No data recorded  Encounter Date: 02/11/2020  PT End of Session - 02/11/20 1500    Visit Number  2    Number of Visits  20    Date for PT Re-Evaluation  04/14/20    Authorization Type  Medicare    Authorization Time Period  5/5 through 7/14    Authorization - Visit Number  2    Authorization - Number of Visits  20    Progress Note Due on Visit  10    PT Start Time  1330    PT Stop Time  1450    PT Time Calculation (min)  80 min    Activity Tolerance  Patient tolerated treatment well;No increased pain    Behavior During Therapy  WFL for tasks assessed/performed       Past Medical History:  Diagnosis Date  . Anxiety   . Arthritis   . Cancer ([place])    BASAL CELL SKIN CANCERS REMOVED  . Depression   . Edema    FEET/LEGS  . GERD (gastroesophageal reflux disease)   .  Heart murmur   . History of hiatal hernia   . Hypertension   . Lymphedema   . MRSA (methicillin resistant staph aureus) culture positive    H/O    Past Surgical History:  Procedure Laterality Date  . ABDOMINAL HYSTERECTOMY    . APPENDECTOMY    . BACK SURGERY    . CATARACT EXTRACTION W/PHACO Left 03/28/2018   Procedure: CATARACT EXTRACTION PHACO AND INTRAOCULAR LENS PLACEMENT (IOC);  Surgeon: Eulogio Bear, MD;  Location: [facility];  Service: Ophthalmology;  Laterality: Left;  [place] 00:28.2 AP% 7.9 CDE 2.21 Fluid pack lot # [id number]  .  CATARACT EXTRACTION W/PHACO Right 05/16/2018   Procedure: CATARACT EXTRACTION PHACO AND INTRAOCULAR LENS PLACEMENT (IOC);  Surgeon: Eulogio Bear, MD;  Location: [facility] OR[facility];  Service: Ophthalmology;  Laterality: Right;  [place] 00:26.2 AP% 7.6 CDE 2.01 Fluid pack lot # [id number]  . CHOLECYSTECTOMY    . COLONOSCOPY    . FRACTURE SURGERY     BONE ON SIDE OF RIGHT FOOT  . HERNIA REPAIR     UMBILICAL X 2  . JOINT REPLACEMENT Bilateral    knees  . KIDNEY SURGERY    . LUMBAR WOUND DEBRIDEMENT N/A 08/03/2016   Procedure: IRRIGATION AND DEBRIDEMENT LUMBAR WOUND;  Surgeon: Eustace Moore, MD;  Location: [place];  Service: Neurosurgery;  Laterality: N/A;  . OVARIAN CYST SURGERY    . REPAIR OF LEFT URETER Left 40 YRS AGO  . ROTATOR CUFF REPAIR  2014  . TONSILLECTOMY    . TUBAL LIGATION      There were no vitals filed for this visit.                               PT Short Term Goals - 02/04/20 1421      PT SHORT TERM GOAL #1   Title  Patient will demonstrate functional recruitment of TA with breathing, sit-to-stand, squatting/lifting, and walking to allow for improved pelvic brace coordination, improved balance, and decreased downward pressure on the pelvic organs.    Baseline  Pt. demonstrates dysfunctional breathing and poor TA recruitment/awareness.    Time  5    Period  Weeks    Status  New    Target Date  03/10/20      PT SHORT TERM GOAL #2   Title  Patient will demonstrate improved pelvic alignment and balance of musculature surrounding the pelvis to facilitate decreased PFM spasms and decrease pelvic pain.    Baseline  R anterior/L posterior pelvic obliquity and R thoracic scoliotic curve.    Time  10    Period  Weeks    Status  New    Target Date  04/14/20      PT SHORT TERM GOAL #3   Title  Patient will demonstrate HEP x1 in the clinic to demonstrate understanding and proper form to allow for further improvement.    Baseline  Pt. lacks knowledge of which  therapeutic exercises can help decrease her pain/Sx.    Time  5    Period  Weeks    Status  New    Target Date  03/10/20      PT SHORT TERM GOAL #4   Title  Pt. will demonstrate implementation of behavioral modifications such as fluid intake and use of Urge suppression technique to allow for decreased UUI/frequency.    Baseline  Pt. having UUI daily, drinking a lot of Mtn. Dew  Time  5    Period  Weeks    Status  New    Target Date  03/10/20        PT Long Term Goals - 02/04/20 1417      PT LONG TERM GOAL #1   Title  Patient will report no episodes of Fecal Incontinence over the past two weeks to demonstrate improved function and quality of life and to allow her to participate in going to church and shopping as part of her occupation.    Baseline  Pt. having to change clothes 3 times per day and does not eat if leaving the house due to [place]    Time  20    Period  Weeks    Status  New    Target Date  06/23/20      PT LONG TERM GOAL #2   Title  Patient will describe pain no greater than 3/10 during standing or walking up to 30 min. to demonstrate improved functional ability.    Baseline  pain increases to 6/10 (enough to warrant resting) after 15 min. of standing or walking.    Time  10    Period  Weeks    Status  New    Target Date  04/14/20      PT LONG TERM GOAL #3   Title  Pt. will improve in FOTO score by 15 points in each category to demonstrate improved function.    Baseline  FOTO PFDI Prolapse: 54 PFDI Bowel: 63, Urinary Problem: 44, Bowel Leakage 46, PFDI Urinary: 54, PFDI Pain 46.    Time  20    Period  Weeks    Status  New    Target Date  06/23/20            Plan - 02/11/20 1500    Clinical Impression Statement  Pt. responded well to education and demonstrated understanding and correct performance of all education and exercises today. She demonstrated mild orthostatic dizziness upon returning to sitting and standing following treatment today but recovered  with rest, breathing and water. It took maximal verbla, tactile, and visual cueing but she was able to coordinate a pelvic tilt both in sitting and hook-lying but will require practice and more cueing to improve recruitment of the TA and glutes/obliques.    PT Next Visit Plan  Review success with breathing and other education, Begin correction for R anterior/L posterior rotation, give seated pelvic tilts and hip-flexor stretch.    PT Home Exercise Plan  soda-can theory, belly breathing, urge suppression (breathing only), sit-to-stand with exhale, supine-to-sit.    Consulted and Agree with Plan of Care  Patient       Patient will benefit from skilled therapeutic intervention in order to improve the following deficits and impairments:     Visit Diagnosis: Other idiopathic scoliosis, thoracolumbar region  Other muscle spasm  Other lack of coordination  Sacrococcygeal disorders, not elsewhere classified     Problem List Patient Active Problem List   Diagnosis Date Noted  . Restless legs 10/24/2018  . 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 GERD (gastroesophageal reflux disease) 03/20/2018  . Lymphedema 12/25/2017  . Insomnia 12/18/2017  . Lung mass 03/13/2017  . Coin lesion 03/13/2017  . Advanced care planning/counseling discussion 03/13/2017  . Back pain at L4-L5 level 01/01/2017  . Multiple pulmonary nodules 01/01/2017  . Incidental pulmonary nodule, greater than or equal to 40mm 01/01/2017  . Bilateral leg edema 09/19/2016  . Normocytic normochromic  anemia 08/03/2016  . Spondylolisthesis at L4-L5 level 07/10/2016  . Spinal stenosis of lumbar region with radiculopathy 04/27/2016  . Anxiety 04/27/2016  . Trochanteric bursitis of right hip 10/08/2015  . Hypertension 03/29/2015  . Hypercholesteremia 03/29/2015  . BMI 45.0-49.9, adult ([place]) 03/29/2015  . Arthralgia of hands, bilateral 03/29/2015   Willa Rough DPT, ATC Willa Rough 02/11/2020, 3:28 PM  [place] MAIN [facility] SERVICES [address] [place], [place], [address] Phone: [phone]   Fax:  [phone]  Name: Tracy Espinoza MRN: [id number] Date of Birth: 1945/10/21

## 2020-02-11 NOTE — Patient Instructions (Signed)
   EXhale on EXertion!!!!! (pushing, pulling, lifting, standing, etc.)  Whenever you exert force you need to exhale to allow the pressure to escape out of your vocal cords rather than be pressed down through your pelvic floor. Exhaling also helps engage your deep-core muscles to protect your back and pelvic floor.  Getting In/out of bed     Lying on back, bend left knee and place left arm across chest. Roll all in one movement to the right. Reverse to roll to the left. Always move as one unit.     Once you are lying on you side, move legs to edge of bed. Pull in the pelvic floor and lower tummy and push down with both hands while moving legs off bed to reach sitting position.  *Reverse sequence to return to lying down.  Copyright  VHI. All rights reserved.     Sit, feet flat, scoot forward to the edge of the chair. Inhale as you bend forward at hips, begin to exhale just before and while you stand, contracting the glutes, lower tummy muscles and pelvic floor as if stopping urination as you stand up.   * Do this every time you sit or stand! If you catch yourself doing it "wrong, re-set and do it again so it can become habit!    Urge supression technique:  1) Take a deep breath to convince yourself that you are in control and calm the nervous system.  (ONLY THIS STEP FOR NOW) 2) Do 5 "quick-flick" kegels (pelvic floor muscle contractions) and re-assess the urge. Repeat another set if urge is still present. 3) Once the urge has decreased, start walking calmly to the the bathroom. Stop and repeat steps 1 and 2 as many times as needed until you can successfully get to the toilet. 4) Only once seated, take a deep breath and allow the pelvic floor muscles to relax and allow for the urine to flow.    Do not be discouraged if you are not successful the first couple times, this is normal and it will take practice but remember that YOU are in control. Start by practicing this at home where  you do not have to worry as much if there were to be an accident. Allowing yourself to get rushed or nervous puts the bladder back in control and will not allow the technique to work.   Stabilization: Diaphragmatic Breathing    Lie with knees bent, feet flat. Place one hand on stomach, other on chest. Breathe deeply through nose, lifting belly hand without any motion of hand on chest.  Do this for ~ 5 min. Per night and throughout the day when you feel anxious/nervous/stressed and when you feel a strong urge to go to the bathroom

## 2020-02-17 ENCOUNTER — Other Ambulatory Visit: Payer: Self-pay

## 2020-02-17 ENCOUNTER — Ambulatory Visit: Payer: Medicare Other

## 2020-02-17 DIAGNOSIS — M4125 Other idiopathic scoliosis, thoracolumbar region: Secondary | ICD-10-CM

## 2020-02-17 DIAGNOSIS — M62838 Other muscle spasm: Secondary | ICD-10-CM | POA: Diagnosis not present

## 2020-02-17 DIAGNOSIS — M533 Sacrococcygeal disorders, not elsewhere classified: Secondary | ICD-10-CM | POA: Diagnosis not present

## 2020-02-17 DIAGNOSIS — R278 Other lack of coordination: Secondary | ICD-10-CM | POA: Diagnosis not present

## 2020-02-17 NOTE — Therapy (Signed)
[place] MAIN [facility] SERVICES [address] [place], [place], [address] Phone: [phone]   Fax:  [phone]  Physical Therapy Treatment  The patient has been informed of current processes in place at Outpatient Rehab to protect patients from Covid-19 exposure including social distancing, schedule modifications, and new cleaning procedures. After discussing their particular risk with a therapist based on the patient's personal risk factors, the patient has decided to proceed with in-person therapy.   Patient Details  Name: Tracy Espinoza MRN: [id number] Date of Birth: [birth year] No data recorded  Encounter Date: 02/17/2020  PT End of Session - 02/18/20 1329    Visit Number  3    Number of Visits  20    Date for PT Re-Evaluation  04/14/20    Authorization Type  Medicare    Authorization Time Period  5/5 through 7/14    Authorization - Visit Number  3    Authorization - Number of Visits  20    Progress Note Due on Visit  10    PT Start Time  1500    PT Stop Time  1600    PT Time Calculation (min)  60 min    Activity Tolerance  Patient tolerated treatment well;No increased pain    Behavior During Therapy  WFL for tasks assessed/performed       Past Medical History:  Diagnosis Date  . Anxiety   . Arthritis   . Cancer ([place])    BASAL CELL SKIN CANCERS REMOVED  . Depression   . Edema    FEET/LEGS  . GERD (gastroesophageal reflux disease)   . Heart murmur   . History of hiatal hernia   . Hypertension   . Lymphedema   . MRSA (methicillin resistant staph aureus) culture positive    H/O    Past Surgical History:  Procedure Laterality Date  . ABDOMINAL HYSTERECTOMY    . APPENDECTOMY    . BACK SURGERY    . CATARACT EXTRACTION W/PHACO Left 03/28/2018   Procedure: CATARACT EXTRACTION PHACO AND INTRAOCULAR LENS PLACEMENT (IOC);  Surgeon: Eulogio Bear, MD;  Location: [facility] OR[facility];  Service: Ophthalmology;  Laterality: Left;  [place]  00:28.2 AP% 7.9 CDE 2.21 Fluid pack lot # [id number]  . CATARACT EXTRACTION W/PHACO Right 05/16/2018   Procedure: CATARACT EXTRACTION PHACO AND INTRAOCULAR LENS PLACEMENT (IOC);  Surgeon: Eulogio Bear, MD;  Location: [facility] OR[facility];  Service: Ophthalmology;  Laterality: Right;  [place] 00:26.2 AP% 7.6 CDE 2.01 Fluid pack lot # [id number]  . CHOLECYSTECTOMY    . COLONOSCOPY    . FRACTURE SURGERY     BONE ON SIDE OF RIGHT FOOT  . HERNIA REPAIR     UMBILICAL X 2  . JOINT REPLACEMENT Bilateral    knees  . KIDNEY SURGERY    . LUMBAR WOUND DEBRIDEMENT N/A 08/03/2016   Procedure: IRRIGATION AND DEBRIDEMENT LUMBAR WOUND;  Surgeon: Eustace Moore, MD;  Location: [place];  Service: Neurosurgery;  Laterality: N/A;  . OVARIAN CYST SURGERY    . REPAIR OF LEFT URETER Left 40 YRS AGO  . ROTATOR CUFF REPAIR  2014  . TONSILLECTOMY    . TUBAL LIGATION      There were no vitals filed for this visit.  Pelvic Floor Physical Therapy Treatment Note  SCREENING  Changes in medications, allergies, or medical history?: none    SUBJECTIVE  Patient reports: Has been getting out of the bed the right way but cannot figure out how to  use it to get into bed. Has been able to use the belly breathing to help decrease UUI and is successful ~ 80% of the time.  Has been trying to  Stand the way we talked about (but not exhaling). Pain not bad today because she slept in, has not done much today.    Precautions:  Abdominal hysterectomy, Lymphedema (diagnosed ~ 3 years ago) (needs to be wearing pressure boots) L4-5 fusion in 2017.  Location of pain: LB, Hips, pelvis Current pain: 0/10  Max pain: 5/10 Least pain: 0/10 Nature of pain:"grinding" sore/achy  Patient Goals: Would like to get a handle on her diarrhea so it is not so liquid and she can control it better.  Would like to be able to go out to shop or go to church without fear of FI. Not have to change clothes more than 1 time per day due to leakage. Stool  does seem to be more solid than it was, Thinks that the mucinex is making the difference. Urine incontinence has been terrible the last two days. When she wakes up from sleep/nap she has as a large accident. The worst is when she gets up from doing her "pressure boots".    OBJECTIVE  Changes in: Posture/Observations:  Hyperlordosis/kyphosis, FHP, R anterior/L posterior pelvic obliquity and R thoracic scoliosis.  Range of Motion/Flexibilty:    Strength/MMT:  LE MMT: Pt. Able to stand from sitting with mod to min (with practice) assist when using exhale and no arm-rest.  Pelvic floor: Pt. States she can feel PFM contracting when she performs slight posterior pelvic tilt first.  Abdominal:  10 finger diastasis, narrows to 8 with exhale/posterior pelvic tilt. (from prior session)  Today: Pt. Has difficulty coordination posterior pelvic tilt in hook-lying but performs with ~ 50% efficacy in seated.   Palpation: TTP to R Iliacus  Gait Analysis:  INTERVENTIONS THIS SESSION: Manual: Performed TP release and STM to R iliacus to decrease spasm and pain and allow for improved balance of musculature for improved function and decreased symptoms.  Dry-needle: Performed TPDN with a .30x174mm needle and standard approach as described below to decrease spasm and pain and allow for improved balance of musculature for improved function and decreased symptoms.  Therex: Reviewed and practiced seated pelvic tilts with emphasis on feeling what is happening with the TA and PFM to begin coordination of the deep-core to allow for improved urge-suppression technique success and to eventually strengthen PFM for decreased SUI and FI.  Total time: 65                     Trigger Point Dry Needling - 02/18/20 0001    Consent Given?  Yes    Education Handout Provided  No    Muscles Treated Back/Hip  Iliacus    Dry Needling Comments  right    Iliacus Response  Twitch response  elicited;Palpable increased muscle length             PT Short Term Goals - 02/04/20 1421      PT SHORT TERM GOAL #1   Title  Patient will demonstrate functional recruitment of TA with breathing, sit-to-stand, squatting/lifting, and walking to allow for improved pelvic brace coordination, improved balance, and decreased downward pressure on the pelvic organs.    Baseline  Pt. demonstrates dysfunctional breathing and poor TA recruitment/awareness.    Time  5    Period  Weeks    Status  New    Target Date  03/10/20      PT SHORT TERM GOAL #2   Title  Patient will demonstrate improved pelvic alignment and balance of musculature surrounding the pelvis to facilitate decreased PFM spasms and decrease pelvic pain.    Baseline  R anterior/L posterior pelvic obliquity and R thoracic scoliotic curve.    Time  10    Period  Weeks    Status  New    Target Date  04/14/20      PT SHORT TERM GOAL #3   Title  Patient will demonstrate HEP x1 in the clinic to demonstrate understanding and proper form to allow for further improvement.    Baseline  Pt. lacks knowledge of which therapeutic exercises can help decrease her pain/Sx.    Time  5    Period  Weeks    Status  New    Target Date  03/10/20      PT SHORT TERM GOAL #4   Title  Pt. will demonstrate implementation of behavioral modifications such as fluid intake and use of Urge suppression technique to allow for decreased UUI/frequency.    Baseline  Pt. having UUI daily, drinking a lot of Mtn. Dew    Time  5    Period  Weeks    Status  New    Target Date  03/10/20        PT Long Term Goals - 02/04/20 1417      PT LONG TERM GOAL #1   Title  Patient will report no episodes of Fecal Incontinence over the past two weeks to demonstrate improved function and quality of life and to allow her to participate in going to church and shopping as part of her occupation.    Baseline  Pt. having to change clothes 3 times per day and does not eat if  leaving the house due to [place]    Time  20    Period  Weeks    Status  New    Target Date  06/23/20      PT LONG TERM GOAL #2   Title  Patient will describe pain no greater than 3/10 during standing or walking up to 30 min. to demonstrate improved functional ability.    Baseline  pain increases to 6/10 (enough to warrant resting) after 15 min. of standing or walking.    Time  10    Period  Weeks    Status  New    Target Date  04/14/20      PT LONG TERM GOAL #3   Title  Pt. will improve in FOTO score by 15 points in each category to demonstrate improved function.    Baseline  FOTO PFDI Prolapse: 54 PFDI Bowel: 63, Urinary Problem: 44, Bowel Leakage 46, PFDI Urinary: 54, PFDI Pain 46.    Time  20    Period  Weeks    Status  New    Target Date  06/23/20            Plan - 02/18/20 1329    Clinical Impression Statement  Pt. Responded well to all interventions today, demonstrating improved coordination and recruitment of muscles surrounding the pelvis in seated, decreased muscle spasm and TTP in R hip, as well as understanding and correct performance of all education and exercises provided today. They will continue to benefit from skilled physical therapy to work toward remaining goals and maximize function as well as decrease likelihood of symptom increase or recurrence.     PT Next  Visit Plan  pelvic tilts and add kegels to urge-suppression if appropriate. Continue TPDN and correct for R anterior/L posterior rotation, give hip-flexor, side, and LB stretches.    PT Home Exercise Plan  soda-can theory, belly breathing, urge suppression (breathing only), sit-to-stand with exhale, supine-to-sit, pelvic tilt with PFM and TA.    Consulted and Agree with Plan of Care  Patient       Patient will benefit from skilled therapeutic intervention in order to improve the following deficits and impairments:     Visit Diagnosis: Other idiopathic scoliosis, thoracolumbar region  Other muscle  spasm  Other lack of coordination  Sacrococcygeal disorders, not elsewhere classified     Problem List Patient Active Problem List   Diagnosis Date Noted  . Restless legs 10/24/2018  . 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 GERD (gastroesophageal reflux disease) 03/20/2018  . Lymphedema 12/25/2017  . Insomnia 12/18/2017  . Lung mass 03/13/2017  . Coin lesion 03/13/2017  . Advanced care planning/counseling discussion 03/13/2017  . Back pain at L4-L5 level 01/01/2017  . Multiple pulmonary nodules 01/01/2017  . Incidental pulmonary nodule, greater than or equal to 68mm 01/01/2017  . Bilateral leg edema 09/19/2016  . Normocytic normochromic anemia 08/03/2016  . Spondylolisthesis at L4-L5 level 07/10/2016  . Spinal stenosis of lumbar region with radiculopathy 04/27/2016  . Anxiety 04/27/2016  . Trochanteric bursitis of right hip 10/08/2015  . Hypertension 03/29/2015  . Hypercholesteremia 03/29/2015  . BMI 45.0-49.9, adult ([place]) 03/29/2015  . Arthralgia of hands, bilateral 03/29/2015   Willa Rough DPT, ATC Willa Rough 02/18/2020, 1:32 PM  [place] MAIN [facility] SERVICES [address] [place], [place], [address] Phone: [phone]   Fax:  [phone]  Name: Tracy Espinoza MRN: [id number] Date of Birth: Dec 18, 1945

## 2020-02-17 NOTE — Patient Instructions (Addendum)
   Do 4 sets of 15 tilts per day. You can split them up as much as you want. Breathe in when you are resting (A) and breathe out just before and while you tuck under (B). Feel the Low tummy muscles pull in first, followed by your pelvic floor (kegel)

## 2020-02-18 ENCOUNTER — Ambulatory Visit: Payer: Medicare Other

## 2020-02-25 ENCOUNTER — Other Ambulatory Visit: Payer: Self-pay

## 2020-02-25 ENCOUNTER — Ambulatory Visit: Payer: Medicare Other

## 2020-02-25 DIAGNOSIS — R278 Other lack of coordination: Secondary | ICD-10-CM | POA: Diagnosis not present

## 2020-02-25 DIAGNOSIS — M533 Sacrococcygeal disorders, not elsewhere classified: Secondary | ICD-10-CM

## 2020-02-25 DIAGNOSIS — M62838 Other muscle spasm: Secondary | ICD-10-CM | POA: Diagnosis not present

## 2020-02-25 DIAGNOSIS — M4125 Other idiopathic scoliosis, thoracolumbar region: Secondary | ICD-10-CM

## 2020-02-25 NOTE — Therapy (Signed)
[place] MAIN [facility] SERVICES [address] [place], [place], [address] Phone: [phone]   Fax:  [phone]  Physical Therapy Treatment  The patient has been informed of current processes in place at Outpatient Rehab to protect patients from Covid-19 exposure including social distancing, schedule modifications, and new cleaning procedures. After discussing their particular risk with a therapist based on the patient's personal risk factors, the patient has decided to proceed with in-person therapy.  Patient Details  Name: Tracy Espinoza MRN: [id number] Date of Birth: 02-26-1946 No data recorded  Encounter Date: 02/25/2020  PT End of Session - 02/25/20 1737    Visit Number  4    Number of Visits  20    Date for PT Re-Evaluation  04/14/20    Authorization Type  Medicare    Authorization Time Period  5/5 through 7/14    Authorization - Visit Number  4    Authorization - Number of Visits  20    Progress Note Due on Visit  10    PT Start Time  1330    PT Stop Time  1430    PT Time Calculation (min)  60 min    Activity Tolerance  Patient tolerated treatment well;No increased pain    Behavior During Therapy  WFL for tasks assessed/performed       Past Medical History:  Diagnosis Date  . Anxiety   . Arthritis   . Cancer ([place])    BASAL CELL SKIN CANCERS REMOVED  . Depression   . Edema    FEET/LEGS  . GERD (gastroesophageal reflux disease)   . Heart murmur   . History of hiatal hernia   . Hypertension   . Lymphedema   . MRSA (methicillin resistant staph aureus) culture positive    H/O    Past Surgical History:  Procedure Laterality Date  . ABDOMINAL HYSTERECTOMY    . APPENDECTOMY    . BACK SURGERY    . CATARACT EXTRACTION W/PHACO Left 03/28/2018   Procedure: CATARACT EXTRACTION PHACO AND INTRAOCULAR LENS PLACEMENT (IOC);  Surgeon: Eulogio Bear, MD;  Location: [facility] ORS;  Service: Ophthalmology;  Laterality: Left;  [place]  00:28.2 AP% 7.9 CDE 2.21 Fluid pack lot # [id number]  . CATARACT EXTRACTION W/PHACO Right 05/16/2018   Procedure: CATARACT EXTRACTION PHACO AND INTRAOCULAR LENS PLACEMENT (IOC);  Surgeon: Eulogio Bear, MD;  Location: [facility] ORS;  Service: Ophthalmology;  Laterality: Right;  [place] 00:26.2 AP% 7.6 CDE 2.01 Fluid pack lot # [id number]  . CHOLECYSTECTOMY    . COLONOSCOPY    . FRACTURE SURGERY     BONE ON SIDE OF RIGHT FOOT  . HERNIA REPAIR     UMBILICAL X 2  . JOINT REPLACEMENT Bilateral    knees  . KIDNEY SURGERY    . LUMBAR WOUND DEBRIDEMENT N/A 08/03/2016   Procedure: IRRIGATION AND DEBRIDEMENT LUMBAR WOUND;  Surgeon: Eustace Moore, MD;  Location: [place];  Service: Neurosurgery;  Laterality: N/A;  . OVARIAN CYST SURGERY    . REPAIR OF LEFT URETER Left 40 YRS AGO  . ROTATOR CUFF REPAIR  2014  . TONSILLECTOMY    . TUBAL LIGATION      There were no vitals filed for this visit.   Pelvic Floor Physical Therapy Treatment Note  SCREENING  Changes in medications, allergies, or medical history?: none    SUBJECTIVE  Patient reports: Her R hip has not hurt at all since the last visit. Her low back has  bothered her some, "I guess it's a little bit better" but some days are not. Consistency of BM's are a little more solid which is easier to clean up. Is sometimes able to get all the way, sometimes gets ~ 1/2 way to the bathroom before her bladder just empties when she is using urge suppression technique.   Precautions:  Abdominal hysterectomy, Lymphedema (diagnosed ~ 3 years ago) (needs to be wearing pressure boots) L4-5 fusion in 2017.  Location of pain: LB, L Hip, pelvis Current pain: 0/10  Max pain: 3/10 Least pain: 0/10 Nature of pain:"grinding" sore/achy  Patient Goals: Would like to get a handle on her diarrhea so it is not so liquid and she can control it better. Would like to be able to go out to shop or go to church without fear of FI. Not have to change clothes more  than 1 time per day due to leakage. Stool does seem to be more solid than it was, Thinks that the mucinex is making the difference. Urine incontinence has been terrible the last two days. When she wakes up from sleep/nap she has as a large accident. The worst is when she gets up from doing her "pressure boots".    OBJECTIVE  Changes in: Posture/Observations:  Hyperlordosis/kyphosis, FHP, R anterior/L posterior pelvic obliquity and R thoracic scoliosis.  Range of Motion/Flexibilty:    Strength/MMT:  LE MMT: Pt. Able to stand from sitting with SBA x2 at end of session.  Pelvic floor: Pt. States she can feel PFM contracting when she performs slight posterior pelvic tilt first.  Abdominal:  10 finger diastasis, narrows to 8 with exhale/posterior pelvic tilt. (from prior session)  Today: Pt. Needed visual cue to perform seated pelvic tilts correctly but was able to repeat correctly.  Palpation: TTP to L [place] and B ~ L 4-5 Multifidus and ~ L3-4 paraspinals.  Gait Analysis:  INTERVENTIONS THIS SESSION: Manual: Performed TP release and STM to L Ill[employer]us and B ~ L 4-5 Multifidus and ~ L3-4 paraspinals to decrease spasm and pain and allow for improved balance of musculature for improved function and decreased symptoms.  Dry-needle: Performed TPDN with a .30x130mm needle and standard approach as described below to decrease spasm and pain and allow for improved balance of musculature for improved function and decreased symptoms.  Therex: Reviewed and practiced seated pelvic tilts with visual cues and recorded Pt. performing to further improve HEP efficacy and coordination of the deep-core to allow for improved urge-suppression technique success and to eventually strengthen PFM for decreased SUI and FI.  Total time: 60                      Trigger Point Dry Needling - 02/25/20 0001    Consent Given?  Yes    Education Handout Provided  No    Muscles Treated Back/Hip   Iliacus;Erector spinae;Lumbar multifidi    Dry Needling Comments  Left iliacus, B for others    Iliacus Response  Twitch response elicited;Palpable increased muscle length    Erector spinae Response  Twitch response elicited;Palpable increased muscle length    Lumbar multifidi Response  Twitch response elicited;Palpable increased muscle length             PT Short Term Goals - 02/04/20 1421      PT SHORT TERM GOAL #1   Title  Patient will demonstrate functional recruitment of TA with breathing, sit-to-stand, squatting/lifting, and walking to allow for improved pelvic brace coordination,  improved balance, and decreased downward pressure on the pelvic organs.    Baseline  Pt. demonstrates dysfunctional breathing and poor TA recruitment/awareness.    Time  5    Period  Weeks    Status  New    Target Date  03/10/20      PT SHORT TERM GOAL #2   Title  Patient will demonstrate improved pelvic alignment and balance of musculature surrounding the pelvis to facilitate decreased PFM spasms and decrease pelvic pain.    Baseline  R anterior/L posterior pelvic obliquity and R thoracic scoliotic curve.    Time  10    Period  Weeks    Status  New    Target Date  04/14/20      PT SHORT TERM GOAL #3   Title  Patient will demonstrate HEP x1 in the clinic to demonstrate understanding and proper form to allow for further improvement.    Baseline  Pt. lacks knowledge of which therapeutic exercises can help decrease her pain/Sx.    Time  5    Period  Weeks    Status  New    Target Date  03/10/20      PT SHORT TERM GOAL #4   Title  Pt. will demonstrate implementation of behavioral modifications such as fluid intake and use of Urge suppression technique to allow for decreased UUI/frequency.    Baseline  Pt. having UUI daily, drinking a lot of Mtn. Dew    Time  5    Period  Weeks    Status  New    Target Date  03/10/20        PT Long Term Goals - 02/04/20 1417      PT LONG TERM GOAL #1    Title  Patient will report no episodes of Fecal Incontinence over the past two weeks to demonstrate improved function and quality of life and to allow her to participate in going to church and shopping as part of her occupation.    Baseline  Pt. having to change clothes 3 times per day and does not eat if leaving the house due to [place]    Time  20    Period  Weeks    Status  New    Target Date  06/23/20      PT LONG TERM GOAL #2   Title  Patient will describe pain no greater than 3/10 during standing or walking up to 30 min. to demonstrate improved functional ability.    Baseline  pain increases to 6/10 (enough to warrant resting) after 15 min. of standing or walking.    Time  10    Period  Weeks    Status  New    Target Date  04/14/20      PT LONG TERM GOAL #3   Title  Pt. will improve in FOTO score by 15 points in each category to demonstrate improved function.    Baseline  FOTO PFDI Prolapse: 54 PFDI Bowel: 63, Urinary Problem: 44, Bowel Leakage 46, PFDI Urinary: 54, PFDI Pain 46.    Time  20    Period  Weeks    Status  New    Target Date  06/23/20            Plan - 02/25/20 1744    Clinical Impression Statement  Pt. Responded well to all interventions today, demonstrating improved deep-core coordination and resolution of pain with posterior pelvic tilt, ability to stand without UE support, as well  as understanding and correct performance of all education and exercises provided today. They will continue to benefit from skilled physical therapy to work toward remaining goals and maximize function as well as decrease likelihood of symptom increase or recurrence.     PT Next Visit Plan  review pelvic tilts in seated and supine and add kegels to urge-suppression if appropriate. Continue TPDN and correct for R anterior/L posterior rotation, give hip-flexor, side, and LB stretches.    PT Home Exercise Plan  soda-can theory, belly breathing, urge suppression (breathing only), sit-to-stand  with exhale, supine-to-sit, pelvic tilt with PFM and TA.    Consulted and Agree with Plan of Care  Patient       Patient will benefit from skilled therapeutic intervention in order to improve the following deficits and impairments:     Visit Diagnosis: Other idiopathic scoliosis, thoracolumbar region  Other muscle spasm  Other lack of coordination  Sacrococcygeal disorders, not elsewhere classified     Problem List Patient Active Problem List   Diagnosis Date Noted  . Restless legs 10/24/2018  . 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 GERD (gastroesophageal reflux disease) 03/20/2018  . Lymphedema 12/25/2017  . Insomnia 12/18/2017  . Lung mass 03/13/2017  . Coin lesion 03/13/2017  . Advanced care planning/counseling discussion 03/13/2017  . Back pain at L4-L5 level 01/01/2017  . Multiple pulmonary nodules 01/01/2017  . Incidental pulmonary nodule, greater than or equal to 14mm 01/01/2017  . Bilateral leg edema 09/19/2016  . Normocytic normochromic anemia 08/03/2016  . Spondylolisthesis at L4-L5 level 07/10/2016  . Spinal stenosis of lumbar region with radiculopathy 04/27/2016  . Anxiety 04/27/2016  . Trochanteric bursitis of right hip 10/08/2015  . Hypertension 03/29/2015  . Hypercholesteremia 03/29/2015  . BMI 45.0-49.9, adult ([place]) 03/29/2015  . Arthralgia of hands, bilateral 03/29/2015   Willa Rough DPT, ATC Willa Rough 02/25/2020, 5:50 PM  [place] MAIN [facility] SERVICES [address] [place], [place], [address] Phone: [phone]   Fax:  [phone]  Name: Tracy Espinoza MRN: [id number] Date of Birth: [birth year]

## 2020-03-03 ENCOUNTER — Ambulatory Visit: Payer: Medicare Other | Attending: Gastroenterology

## 2020-03-03 ENCOUNTER — Other Ambulatory Visit: Payer: Self-pay

## 2020-03-03 DIAGNOSIS — M4125 Other idiopathic scoliosis, thoracolumbar region: Secondary | ICD-10-CM | POA: Insufficient documentation

## 2020-03-03 DIAGNOSIS — M533 Sacrococcygeal disorders, not elsewhere classified: Secondary | ICD-10-CM | POA: Diagnosis present

## 2020-03-03 DIAGNOSIS — M62838 Other muscle spasm: Secondary | ICD-10-CM | POA: Insufficient documentation

## 2020-03-03 DIAGNOSIS — R278 Other lack of coordination: Secondary | ICD-10-CM | POA: Diagnosis present

## 2020-03-03 NOTE — Therapy (Signed)
[place] MAIN [facility] SERVICES [address] [place], [place], [address] Phone: [phone]   Fax:  [phone]  Physical Therapy Treatment  The patient has been informed of current processes in place at Outpatient Rehab to protect patients from Covid-19 exposure including social distancing, schedule modifications, and new cleaning procedures. After discussing their particular risk with a therapist based on the patient's personal risk factors, the patient has decided to proceed with in-person therapy.   Patient Details  Name: Tracy Espinoza MRN: [id number] Date of Birth: [birth year] No data recorded  Encounter Date: 03/03/2020  PT End of Session - 03/03/20 1506    Visit Number  5    Number of Visits  20    Date for PT Re-Evaluation  04/14/20    Authorization Type  Medicare    Authorization Time Period  5/5 through 7/14    Authorization - Visit Number  5    Authorization - Number of Visits  20    Progress Note Due on Visit  10    PT Start Time  1330    PT Stop Time  1430    PT Time Calculation (min)  60 min    Activity Tolerance  Patient tolerated treatment well;No increased pain    Behavior During Therapy  WFL for tasks assessed/performed       Past Medical History:  Diagnosis Date  . Anxiety   . Arthritis   . Cancer ([place])    BASAL CELL SKIN CANCERS REMOVED  . Depression   . Edema    FEET/LEGS  . GERD (gastroesophageal reflux disease)   . Heart murmur   . History of hiatal hernia   . Hypertension   . Lymphedema   . MRSA (methicillin resistant staph aureus) culture positive    H/O    Past Surgical History:  Procedure Laterality Date  . ABDOMINAL HYSTERECTOMY    . APPENDECTOMY    . BACK SURGERY    . CATARACT EXTRACTION W/PHACO Left 03/28/2018   Procedure: CATARACT EXTRACTION PHACO AND INTRAOCULAR LENS PLACEMENT (IOC);  Surgeon: Eulogio Bear, MD;  Location: [facility] ORS;  Service: Ophthalmology;  Laterality: Left;  [place]  00:28.2 AP% 7.9 CDE 2.21 Fluid pack lot # [id number]  . CATARACT EXTRACTION W/PHACO Right 05/16/2018   Procedure: CATARACT EXTRACTION PHACO AND INTRAOCULAR LENS PLACEMENT (IOC);  Surgeon: Eulogio Bear, MD;  Location: [facility] ORS;  Service: Ophthalmology;  Laterality: Right;  [place] 00:26.2 AP% 7.6 CDE 2.01 Fluid pack lot # [id number]  . CHOLECYSTECTOMY    . COLONOSCOPY    . FRACTURE SURGERY     BONE ON SIDE OF RIGHT FOOT  . HERNIA REPAIR     UMBILICAL X 2  . JOINT REPLACEMENT Bilateral    knees  . KIDNEY SURGERY    . LUMBAR WOUND DEBRIDEMENT N/A 08/03/2016   Procedure: IRRIGATION AND DEBRIDEMENT LUMBAR WOUND;  Surgeon: Eustace Moore, MD;  Location: [place];  Service: Neurosurgery;  Laterality: N/A;  . OVARIAN CYST SURGERY    . REPAIR OF LEFT URETER Left 40 YRS AGO  . ROTATOR CUFF REPAIR  2014  . TONSILLECTOMY    . TUBAL LIGATION      There were no vitals filed for this visit.  Pelvic Floor Physical Therapy Treatment Note  SCREENING  Changes in medications, allergies, or medical history?: none    SUBJECTIVE  Patient reports: Her legs feel "infinately better" but she still gets pain some when standing for a while  to do dishes or fold laundry. The back also hurts a little when she does her tilts. Seems to have days where she can "handle it pretty well and days where it is a blowout". Still not eating much throughout the day. Had ~ 2/7 "good days". Has days where the bladder is worse and then it is worse all day.   Precautions:  Abdominal hysterectomy, Lymphedema (diagnosed ~ 3 years ago) (needs to be wearing pressure boots) L4-5 fusion in 2017.  Location of pain: LB, L Hip, pelvis Current pain: 2/10  Max pain: 4/10 Least pain: 0/10 Nature of pain:"grinding" sore/achy  Patient Goals: Would like to get a handle on her diarrhea so it is not so liquid and she can control it better. Would like to be able to go out to shop or go to church without fear of FI. Not have to change  clothes more than 1 time per day due to leakage. Stool does seem to be more solid than it was, Thinks that the mucinex is making the difference. Urine incontinence has been terrible the last two days. When she wakes up from sleep/nap she has as a large accident. The worst is when she gets up from doing her "pressure boots".    OBJECTIVE  Changes in: Posture/Observations:  Hyperlordosis/kyphosis, FHP, R anterior/L posterior pelvic obliquity and R thoracic scoliosis.  Range of Motion/Flexibilty:  Decreased motion at L SIJ compared to R  Strength/MMT:  LE MMT: Pt. Able to bring her RLE up onto the mat independently today and stood from mat without hands or support!  R glutes do not want to fire due to spasm, improved recruitment following release  Pelvic floor: Pt. States she can feel PFM contracting when she performs slight posterior pelvic tilt first.  Abdominal:  10 finger diastasis, narrows to 8 with exhale/posterior pelvic tilt. (from prior session)  Today: Pt. Needed visual cue to perform seated pelvic tilts correctly but was able to repeat correctly. In supine, pt. Able to perform correctly 90+% of the time independently.  Palpation: TTP to R Psoas, Glute Max, Med, Min,OI  Gait Analysis: Pt. Spends increased stance time on RLE with mild trendelenburg, more motion at R SIJ, decreased motion at L  INTERVENTIONS THIS SESSION: Manual: Performed TP release to R Psoas, Glute Max, Med, Min,OI to decrease spasm and pain and allow for improved balance of musculature for improved function and decreased symptoms.  Therex: Reviewed and practiced seated and hook-lying pelvic tilts with tactile cues and educated on and practiced heel-presses with RLE only to help decrease R anterior innominate rotation and increase coordination of the deep-core to allow for improved urge-suppression technique success and to eventually strengthen PFM for decreased SUI and FI .  Total time:  60                             PT Short Term Goals - 02/04/20 1421      PT SHORT TERM GOAL #1   Title  Patient will demonstrate functional recruitment of TA with breathing, sit-to-stand, squatting/lifting, and walking to allow for improved pelvic brace coordination, improved balance, and decreased downward pressure on the pelvic organs.    Baseline  Pt. demonstrates dysfunctional breathing and poor TA recruitment/awareness.    Time  5    Period  Weeks    Status  New    Target Date  03/10/20      PT SHORT TERM GOAL #2  Title  Patient will demonstrate improved pelvic alignment and balance of musculature surrounding the pelvis to facilitate decreased PFM spasms and decrease pelvic pain.    Baseline  R anterior/L posterior pelvic obliquity and R thoracic scoliotic curve.    Time  10    Period  Weeks    Status  New    Target Date  04/14/20      PT SHORT TERM GOAL #3   Title  Patient will demonstrate HEP x1 in the clinic to demonstrate understanding and proper form to allow for further improvement.    Baseline  Pt. lacks knowledge of which therapeutic exercises can help decrease her pain/Sx.    Time  5    Period  Weeks    Status  New    Target Date  03/10/20      PT SHORT TERM GOAL #4   Title  Pt. will demonstrate implementation of behavioral modifications such as fluid intake and use of Urge suppression technique to allow for decreased UUI/frequency.    Baseline  Pt. having UUI daily, drinking a lot of Mtn. Dew    Time  5    Period  Weeks    Status  New    Target Date  03/10/20        PT Long Term Goals - 02/04/20 1417      PT LONG TERM GOAL #1   Title  Patient will report no episodes of Fecal Incontinence over the past two weeks to demonstrate improved function and quality of life and to allow her to participate in going to church and shopping as part of her occupation.    Baseline  Pt. having to change clothes 3 times per day and does not eat if  leaving the house due to [place]    Time  20    Period  Weeks    Status  New    Target Date  06/23/20      PT LONG TERM GOAL #2   Title  Patient will describe pain no greater than 3/10 during standing or walking up to 30 min. to demonstrate improved functional ability.    Baseline  pain increases to 6/10 (enough to warrant resting) after 15 min. of standing or walking.    Time  10    Period  Weeks    Status  New    Target Date  04/14/20      PT LONG TERM GOAL #3   Title  Pt. will improve in FOTO score by 15 points in each category to demonstrate improved function.    Baseline  FOTO PFDI Prolapse: 54 PFDI Bowel: 63, Urinary Problem: 44, Bowel Leakage 46, PFDI Urinary: 54, PFDI Pain 46.    Time  20    Period  Weeks    Status  New    Target Date  06/23/20            Plan - 03/03/20 1507    Clinical Impression Statement  Pt. Responded well to all interventions today, demonstrating decreased spasm and TTP, improved glute recruitment and steadiness in standing, as well as understanding and correct performance of all education and exercises provided today. They will continue to benefit from skilled physical therapy to work toward remaining goals and maximize function as well as decrease likelihood of symptom increase or recurrence.    PT Next Visit Plan  TP release to R TFL and vastus lateralis, deep hip ER's,review pelvic tilts in supine and heel press and  add kegels to urge-suppression if appropriate. Continue TPDN and correct for R anterior/L posterior rotation, give hip-flexor, side, and LB stretches.    PT Home Exercise Plan  soda-can theory, belly breathing, urge suppression (breathing only), sit-to-stand with exhale, supine-to-sit, pelvic tilt with PFM and TA, hook-lying pelvic tilts, R hip EXT/heel press.    Consulted and Agree with Plan of Care  Patient       Patient will benefit from skilled therapeutic intervention in order to improve the following deficits and impairments:      Visit Diagnosis: Other idiopathic scoliosis, thoracolumbar region  Other muscle spasm  Other lack of coordination  Sacrococcygeal disorders, not elsewhere classified     Problem List Patient Active Problem List   Diagnosis Date Noted  . Restless legs 10/24/2018  . 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 GERD (gastroesophageal reflux disease) 03/20/2018  . Lymphedema 12/25/2017  . Insomnia 12/18/2017  . Lung mass 03/13/2017  . Coin lesion 03/13/2017  . Advanced care planning/counseling discussion 03/13/2017  . Back pain at L4-L5 level 01/01/2017  . Multiple pulmonary nodules 01/01/2017  . Incidental pulmonary nodule, greater than or equal to 61mm 01/01/2017  . Bilateral leg edema 09/19/2016  . Normocytic normochromic anemia 08/03/2016  . Spondylolisthesis at L4-L5 level 07/10/2016  . Spinal stenosis of lumbar region with radiculopathy 04/27/2016  . Anxiety 04/27/2016  . Trochanteric bursitis of right hip 10/08/2015  . Hypertension 03/29/2015  . Hypercholesteremia 03/29/2015  . BMI 45.0-49.9, adult ([place]) 03/29/2015  . Arthralgia of hands, bilateral 03/29/2015   Willa Rough DPT, ATC Willa Rough 03/03/2020, 3:09 PM  [place] MAIN [facility] SERVICES [address] [place], [place], [address] Phone: [phone]   Fax:  [phone]  Name: GUADLUPE HOLTAN MRN: [id number] Date of Birth: 01-09-1946

## 2020-03-03 NOTE — Patient Instructions (Signed)
Pelvic Tilt With Pelvic Floor (Hook-Lying)        Lie with hips and knees bent. Squeeze pelvic floor and flatten low back while breathing out so that pelvis tilts. Repeat _10x3__ times. Do _1-2__ times a day.   Follow up your pelvic tilts with doing 5 heel presses on the right-side ONLY where you will tuck slightly and squeeze the right butt cheek as you push the heel into the bed. Make sure the back is getting flatter NOT ARCHING. Hold for 5 seconds (5 presses, holding 5 seconds each) do this 1-2 times per day.

## 2020-03-08 ENCOUNTER — Other Ambulatory Visit: Payer: Self-pay

## 2020-03-08 ENCOUNTER — Ambulatory Visit: Payer: Medicare Other

## 2020-03-08 DIAGNOSIS — M4125 Other idiopathic scoliosis, thoracolumbar region: Secondary | ICD-10-CM | POA: Diagnosis not present

## 2020-03-08 DIAGNOSIS — R278 Other lack of coordination: Secondary | ICD-10-CM

## 2020-03-08 DIAGNOSIS — M62838 Other muscle spasm: Secondary | ICD-10-CM

## 2020-03-08 DIAGNOSIS — M533 Sacrococcygeal disorders, not elsewhere classified: Secondary | ICD-10-CM

## 2020-03-08 NOTE — Therapy (Signed)
[place] MAIN [facility] SERVICES [address] [place], [place], [address] Phone: [phone]   Fax:  [phone]  Physical Therapy Treatment  The patient has been informed of current processes in place at Outpatient Rehab to protect patients from Covid-19 exposure including social distancing, schedule modifications, and new cleaning procedures. After discussing their particular risk with a therapist based on the patient's personal risk factors, the patient has decided to proceed with in-person therapy.  Patient Details  Name: Tracy Espinoza MRN: [id number] Date of Birth: 1946/06/21 No data recorded  Encounter Date: 03/08/2020  PT End of Session - 03/09/20 1554    Visit Number  6    Number of Visits  20    Date for PT Re-Evaluation  04/14/20    Authorization Type  Medicare    Authorization Time Period  5/5 through 7/14    Authorization - Visit Number  6    Authorization - Number of Visits  20    Progress Note Due on Visit  10    PT Start Time  1400    PT Stop Time  1500    PT Time Calculation (min)  60 min    Activity Tolerance  Patient tolerated treatment well;No increased pain    Behavior During Therapy  WFL for tasks assessed/performed       Past Medical History:  Diagnosis Date   Anxiety    Arthritis    Cancer ([place])    BASAL CELL SKIN CANCERS REMOVED   Depression    Edema    FEET/LEGS   GERD (gastroesophageal reflux disease)    Heart murmur    History of hiatal hernia    Hypertension    Lymphedema    MRSA (methicillin resistant staph aureus) culture positive    H/O    Past Surgical History:  Procedure Laterality Date   ABDOMINAL HYSTERECTOMY     APPENDECTOMY     BACK SURGERY     CATARACT EXTRACTION W/PHACO Left 03/28/2018   Procedure: CATARACT EXTRACTION PHACO AND INTRAOCULAR LENS PLACEMENT ([place]);  Surgeon: Eulogio Bear, MD;  Location: [facility];  Service: Ophthalmology;  Laterality: Left;  [place] 00:28.2 AP%  7.9 CDE 2.21 Fluid pack lot # [id number]   CATARACT EXTRACTION W/PHACO Right 05/16/2018   Procedure: CATARACT EXTRACTION PHACO AND INTRAOCULAR LENS PLACEMENT ([place]);  Surgeon: Eulogio Bear, MD;  Location: [facility] ORS;  Service: Ophthalmology;  Laterality: Right;  [place] 00:26.2 AP% 7.6 CDE 2.01 Fluid pack lot # [id number]   CHOLECYSTECTOMY     COLONOSCOPY     FRACTURE SURGERY     BONE ON SIDE OF RIGHT FOOT   HERNIA REPAIR     UMBILICAL X 2   JOINT REPLACEMENT Bilateral    knees   KIDNEY SURGERY     LUMBAR WOUND DEBRIDEMENT N/A 08/03/2016   Procedure: IRRIGATION AND DEBRIDEMENT LUMBAR WOUND;  Surgeon: Eustace Moore, MD;  Location: [place];  Service: Neurosurgery;  Laterality: N/A;   OVARIAN CYST SURGERY     REPAIR OF LEFT URETER Left 40 YRS AGO   ROTATOR CUFF REPAIR  2014   TONSILLECTOMY     TUBAL LIGATION      There were no vitals filed for this visit.    Pelvic Floor Physical Therapy Treatment Note  SCREENING  Changes in medications, allergies, or medical history?: none    SUBJECTIVE  Patient reports: Not much change in the urgency yet. Has been doing her breaths to stand up  but not doing urge suppression before standing yet. Most often gets the urge after sitting for a long time.  Precautions:  Precautions:  Abdominal hysterectomy, Lymphedema (diagnosed ~ 3 years ago) (needs to be wearing pressure boots) L4-5 fusion in 2017.  Location of pain: LB, L Hip, pelvis Current pain: 3/10  Max pain: 7/10 Least pain: 0/10 Nature of pain:"grinding" sore/achy  Patient Goals: Would like to get a handle on her diarrhea so it is not so liquid and she can control it better. Would like to be able to go out to shop or go to church without fear of FI. Not have to change clothes more than 1 time per day due to leakage. Stool does seem to be more solid than it was, Thinks that the mucinex is making the difference. Urine incontinence has been terrible the last two days. When  she wakes up from sleep/nap she has as a large accident. The worst is when she gets up from doing her "pressure boots".    OBJECTIVE  Changes in: Posture/Observations:  Hyperlordosis/kyphosis, FHP, R anterior/L posterior pelvic obliquity and R thoracic scoliosis.  Range of Motion/Flexibilty:  Decreased motion at L SIJ compared to R  Strength/MMT:  LE MMT: Pt. Able to bring her RLE up onto the mat independently today and stood from mat without hands or support!  R glutes do not want to fire due to spasm, improved recruitment following release  4+ for knee EXT and flex, 4/5 for hip flex, DF, hip EXT  Pelvic floor: Pt. States she can feel PFM contracting when she performs slight posterior pelvic tilt first.  Abdominal:  10 finger diastasis, narrows to 8 with exhale/posterior pelvic tilt. (from prior session)  Today: Pt. Needed visual cue to perform seated pelvic tilts correctly but was able to repeat correctly. In supine, pt. Able to perform correctly 90+% of the time independently.  Palpation: TTP to R Psoas, Glute Max, Med, Min,OI  Gait Analysis: Pt. Spends increased stance time on RLE with mild trendelenburg, more motion at R SIJ, decreased motion at L  INTERVENTIONS THIS SESSION: Manual: Performed TP release to R TFL, Glute Med,  to decrease spasm and pain and allow for improved balance of musculature for improved function and decreased symptoms.  Therex: Practiced pushing into the table with both heels and using glutes to help with bed mobility and strengthen glutes for reciprocal inhibition of the hip-flexors. Reviewed and practiced hook-lying pelvic tilts with tactile cues to increase coordination of the deep-core to allow for improved urge-suppression technique success and to eventually strengthen PFM for decreased SUI and FI.  Self-care: assessed leg-length and gave Pt. Heel-lift for the LLE to decrease LLD and allow for decreased shearing force across the SIJ to decrease  spasm and pain and increase PFM function.  Total time: 60                           PT Short Term Goals - 02/04/20 1421      PT SHORT TERM GOAL #1   Title  Patient will demonstrate functional recruitment of TA with breathing, sit-to-stand, squatting/lifting, and walking to allow for improved pelvic brace coordination, improved balance, and decreased downward pressure on the pelvic organs.    Baseline  Pt. demonstrates dysfunctional breathing and poor TA recruitment/awareness.    Time  5    Period  Weeks    Status  New    Target Date  03/10/20  PT SHORT TERM GOAL #2   Title  Patient will demonstrate improved pelvic alignment and balance of musculature surrounding the pelvis to facilitate decreased PFM spasms and decrease pelvic pain.    Baseline  R anterior/L posterior pelvic obliquity and R thoracic scoliotic curve.    Time  10    Period  Weeks    Status  New    Target Date  04/14/20      PT SHORT TERM GOAL #3   Title  Patient will demonstrate HEP x1 in the clinic to demonstrate understanding and proper form to allow for further improvement.    Baseline  Pt. lacks knowledge of which therapeutic exercises can help decrease her pain/Sx.    Time  5    Period  Weeks    Status  New    Target Date  03/10/20      PT SHORT TERM GOAL #4   Title  Pt. will demonstrate implementation of behavioral modifications such as fluid intake and use of Urge suppression technique to allow for decreased UUI/frequency.    Baseline  Pt. having UUI daily, drinking a lot of Mtn. Dew    Time  5    Period  Weeks    Status  New    Target Date  03/10/20        PT Long Term Goals - 02/04/20 1417      PT LONG TERM GOAL #1   Title  Patient will report no episodes of Fecal Incontinence over the past two weeks to demonstrate improved function and quality of life and to allow her to participate in going to church and shopping as part of her occupation.    Baseline  Pt. having to  change clothes 3 times per day and does not eat if leaving the house due to [place]    Time  20    Period  Weeks    Status  New    Target Date  06/23/20      PT LONG TERM GOAL #2   Title  Patient will describe pain no greater than 3/10 during standing or walking up to 30 min. to demonstrate improved functional ability.    Baseline  pain increases to 6/10 (enough to warrant resting) after 15 min. of standing or walking.    Time  10    Period  Weeks    Status  New    Target Date  04/14/20      PT LONG TERM GOAL #3   Title  Pt. will improve in FOTO score by 15 points in each category to demonstrate improved function.    Baseline  FOTO PFDI Prolapse: 54 PFDI Bowel: 63, Urinary Problem: 44, Bowel Leakage 46, PFDI Urinary: 54, PFDI Pain 46.    Time  20    Period  Weeks    Status  New    Target Date  06/23/20            Plan - 03/09/20 1554    Clinical Impression Statement  Pt. Responded well to all interventions today, demonstrating improved pelvic alignment with addition of heel-lift and decreased spasm/TTP to R hip with increased ability to tolerate side-lying as well as understanding and correct performance of all education and exercises provided today. They will continue to benefit from skilled physical therapy to work toward remaining goals and maximize function as well as decrease likelihood of symptom increase or recurrence.    PT Next Visit Plan  hip IR strengthening for  RLE,  review pelvic tilts in supine and heel press and add kegels to urge-suppression if appropriate. Continue TPDN and correct for R anterior/L posterior rotation, give hip-flexor, side, and LB stretches.    PT Home Exercise Plan  soda-can theory, belly breathing, urge suppression (breathing only), sit-to-stand with exhale, supine-to-sit, pelvic tilt with PFM and TA, hook-lying pelvic tilts, R hip EXT/heel press, heel-lift for LLE.    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[place]) 10/24/2018   GERD (gastroesophageal reflux disease) 03/20/2018   Lymphedema 12/25/2017   Insomnia 12/18/2017   Lung mass 03/13/2017   Coin lesion 03/13/2017   Advanced care planning/counseling discussion 03/13/2017   Back pain at L4-L5 level 01/01/2017   Multiple pulmonary nodules 01/01/2017   Incidental pulmonary nodule, greater than or equal to 53mm 01/01/2017   Bilateral leg edema 09/19/2016   Normocytic normochromic anemia 08/03/2016   Spondylolisthesis at L4-L5 level 07/10/2016   Spinal stenosis of lumbar region with radiculopathy 04/27/2016   Anxiety 04/27/2016   Trochanteric bursitis of right hip 10/08/2015   Hypertension 03/29/2015   Hypercholesteremia 03/29/2015   BMI 45.0-49.9, adult ([place]) 03/29/2015   Arthralgia of hands, bilateral 03/29/2015   Willa Rough DPT, ATC Willa Rough 03/09/2020, 3:58 PM  [place] MAIN [facility] SERVICES [address] [place], [place], [address] Phone: [phone]   Fax:  [phone]  Name: Tracy Espinoza MRN: [id number] Date of Birth: [birth year]

## 2020-03-10 ENCOUNTER — Ambulatory Visit: Payer: Medicare Other

## 2020-03-10 ENCOUNTER — Ambulatory Visit (INDEPENDENT_AMBULATORY_CARE_PROVIDER_SITE_OTHER): Payer: Medicare Other | Admitting: Gastroenterology

## 2020-03-10 ENCOUNTER — Encounter: Payer: Self-pay | Admitting: Gastroenterology

## 2020-03-10 ENCOUNTER — Telehealth: Payer: Self-pay | Admitting: Family Medicine

## 2020-03-10 ENCOUNTER — Other Ambulatory Visit: Payer: Self-pay

## 2020-03-10 VITALS — BP 160/79 | HR 73 | Temp 98.1°F | Wt 274.0 lb

## 2020-03-10 DIAGNOSIS — K909 Intestinal malabsorption, unspecified: Secondary | ICD-10-CM | POA: Diagnosis not present

## 2020-03-10 DIAGNOSIS — R197 Diarrhea, unspecified: Secondary | ICD-10-CM

## 2020-03-10 MED ORDER — LOPERAMIDE HCL 2 MG PO CAPS: ORAL_CAPSULE | ORAL | 0 refills | Status: DC

## 2020-03-10 NOTE — Telephone Encounter (Signed)
Called and LVM asking for patient to please return my call. Unsure of what lab results patient is referring to as I do not see any recent labs.

## 2020-03-10 NOTE — Telephone Encounter (Signed)
Copied from [place] [id number]. Topic: General - Call Back - No Documentation >> Mar 10, 2020  1:37 PM Erick Blinks wrote: Reason for CRM: Pt called in regard to lab results  Best contact: [phone]

## 2020-03-10 NOTE — Patient Instructions (Addendum)
Please STOP taking Omeprazole and Bentyl.  Please start taking Imodium 2 MG daily for 7 days and then stop.

## 2020-03-11 ENCOUNTER — Ambulatory Visit: Payer: Medicare Other

## 2020-03-11 DIAGNOSIS — R278 Other lack of coordination: Secondary | ICD-10-CM

## 2020-03-11 DIAGNOSIS — M4125 Other idiopathic scoliosis, thoracolumbar region: Secondary | ICD-10-CM

## 2020-03-11 DIAGNOSIS — M533 Sacrococcygeal disorders, not elsewhere classified: Secondary | ICD-10-CM

## 2020-03-11 DIAGNOSIS — M62838 Other muscle spasm: Secondary | ICD-10-CM

## 2020-03-11 NOTE — Patient Instructions (Signed)
  DO 10-20 pelvic tilts between every episode of TV etc.   And do 2 breaths followed by 5 tilts before you stand to help you get to the bathroom better.     Sit, feet flat, scoot forward to the edge of the chair. Inhale as you bend forward at hips, begin to exhale just before and while you stand, contracting the glutes, lower tummy muscles and pelvic floor as if stopping urination as you stand up.   * Do this every time you sit or stand! If you catch yourself doing it "wrong, re-set and do it again so it can become habit!  Also do 5 times in a row when you go to sit back down after standing to go to the bathroom, get food, etc.   Shoulder Retraction and Downward Rotation   Rotate the shoulder blades back and down as if you had to hold a pencil between them, holding for 1 full second each time. Repeat this _3x10_ times _2_ times per day.   ALTERNATE SETS of chin tucks and shoulder squeezes!!!     Start with Shoulder Retraction and Downward Rotation pictures above, holding the position while pulling the chin straight back as if trying to make a "double chin".  Breathe in forward and breathe out as you pull back, repeating this _3x10__ times __2__ times per day.   Put the band around your ankles and do 3 sets of 10, 2 times per day to help strengthen your right hip.

## 2020-03-11 NOTE — Telephone Encounter (Signed)
Spoke with patient, we do not have any recent labs on patient.

## 2020-03-11 NOTE — Therapy (Addendum)
[place] MAIN [facility] SERVICES [address] [place], [place], [address] Phone: [phone]   Fax:  [phone]  Physical Therapy Treatment  The patient has been informed of current processes in place at Outpatient Rehab to protect patients from Covid-19 exposure including social distancing, schedule modifications, and new cleaning procedures. After discussing their particular risk with a therapist based on the patient's personal risk factors, the patient has decided to proceed with in-person therapy.   Patient Details  Name: Tracy Espinoza MRN: [id number] Date of Birth: 1945-10-05 No data recorded  Encounter Date: 03/11/2020   PT End of Session - 03/15/20 0838    Visit Number 7    Number of Visits 20    Date for PT Re-Evaluation 04/14/20    Authorization Type Medicare    Authorization Time Period 5/5 through 7/14    Authorization - Visit Number 7    Authorization - Number of Visits 20    Progress Note Due on Visit 10    PT Start Time 1330    PT Stop Time 1430    PT Time Calculation (min) 60 min    Activity Tolerance Patient tolerated treatment well;No increased pain    Behavior During Therapy WFL for tasks assessed/performed           Past Medical History:  Diagnosis Date  . Anxiety   . Arthritis   . Cancer ([place])    BASAL CELL SKIN CANCERS REMOVED  . Depression   . Edema    FEET/LEGS  . GERD (gastroesophageal reflux disease)   . Heart murmur   . History of hiatal hernia   . Hypertension   . Lymphedema   . MRSA (methicillin resistant staph aureus) culture positive    H/O    Past Surgical History:  Procedure Laterality Date  . ABDOMINAL HYSTERECTOMY    . APPENDECTOMY    . BACK SURGERY    . CATARACT EXTRACTION W/PHACO Left 03/28/2018   Procedure: CATARACT EXTRACTION PHACO AND INTRAOCULAR LENS PLACEMENT ([facility]);  Surgeon: Eulogio Bear, MD;  Location: [facility];  Service: Ophthalmology;  Laterality: Left;  [place] 00:28.2 AP%  7.9 CDE 2.21 Fluid pack lot # [id number]  . CATARACT EXTRACTION W/PHACO Right 05/16/2018   Procedure: CATARACT EXTRACTION PHACO AND INTRAOCULAR LENS PLACEMENT ([facility]);  Surgeon: Eulogio Bear, MD;  Location: [facility];  Service: Ophthalmology;  Laterality: Right;  [place] 00:26.2 AP% 7.6 CDE 2.01 Fluid pack lot # [id number]  . CHOLECYSTECTOMY    . COLONOSCOPY    . FRACTURE SURGERY     BONE ON SIDE OF RIGHT FOOT  . HERNIA REPAIR     UMBILICAL X 2  . JOINT REPLACEMENT Bilateral    knees  . KIDNEY SURGERY    . LUMBAR WOUND DEBRIDEMENT N/A 08/03/2016   Procedure: IRRIGATION AND DEBRIDEMENT LUMBAR WOUND;  Surgeon: Eustace Moore, MD;  Location: [place];  Service: Neurosurgery;  Laterality: N/A;  . OVARIAN CYST SURGERY    . REPAIR OF LEFT URETER Left 40 YRS AGO  . ROTATOR CUFF REPAIR  2014  . TONSILLECTOMY    . TUBAL LIGATION      There were no vitals filed for this visit.  Pelvic Floor Physical Therapy Treatment Note  SCREENING  Changes in medications, allergies, or medical history?: none    SUBJECTIVE  Patient reports: Pain was a little more frequent but not more intense. Has not been feeling the pain in her groin or much in the R  side. Metamucil seems to be helping by increasing the consistency of stool.  Precautions:  Abdominal hysterectomy, Lymphedema (diagnosed ~ 3 years ago) (needs to be wearing pressure boots) L4-5 fusion in 2017.  Location of pain: LB, L Hip, pelvis Current pain: 3/10  Max pain: 6/10 Least pain: 0/10 Nature of pain:"grinding" sore/achy  Patient Goals: Would like to get a handle on her diarrhea so it is not so liquid and she can control it better. Would like to be able to go out to shop or go to church without fear of FI. Not have to change clothes more than 1 time per day due to leakage. Stool does seem to be more solid than it was, Thinks that the mucinex is making the difference. Urine incontinence has been terrible the last two days. When she wakes  up from sleep/nap she has as a large accident. The worst is when she gets up from doing her "pressure boots".    OBJECTIVE  Changes in: Posture/Observations:  Hyperlordosis/kyphosis, FHP, R anterior/L posterior pelvic obliquity and R thoracic scoliosis.  L ASIS high in standing with heel-lift in place.  Range of Motion/Flexibilty:  Decreased motion at L SIJ compared to R  Strength/MMT:  LE MMT: Pt. Able to bring her RLE up onto the mat independently today and stood from mat without hands or support!  R glutes do not want to fire due to spasm, improved recruitment following release  4+ for knee EXT and flex, 4/5 for hip flex, DF, hip EXT  Pelvic floor: Pt. States she can feel PFM contracting when she performs slight posterior pelvic tilt first.  Abdominal:  10 finger diastasis, narrows to 8 with exhale/posterior pelvic tilt. (from prior session)  Today: Pt. Finally able to consistently demonstrate seated pelvic tilt coordination after use of mirror for feedback with tilts in combination with verbal cue to flatten low back.  Palpation:   Gait Analysis: Pt. Spends increased stance time on RLE with mild trendelenburg, more motion at R SIJ, decreased motion at L  INTERVENTIONS THIS SESSION:  Therex: Educated on and Practiced R hip IR with yellow band, scapular rettractions and chin-tucks to improve strength of muscles opposing tight musculature to allow reciprocal inhibition to improve balance of musculature surrounding the pelvis and improve overall posture for optimal musculature length-tension relationship and function.  Self-care: Educated on doing 5 seated pelvic tilts after her 2 belly breaths before getting up from her chair to help get some PFM engagement without directly trying to focus on [place] due to poor proprioception. Educated on and practiced seated posture with lumbar support to allow for relaxation of the PFM and improved recruitment/control. Reduced heel-lift in  LLE by 1 layer to improve balance of pelvis.  Total time: 60                                PT Short Term Goals - 02/04/20 1421      PT SHORT TERM GOAL #1   Title Patient will demonstrate functional recruitment of TA with breathing, sit-to-stand, squatting/lifting, and walking to allow for improved pelvic brace coordination, improved balance, and decreased downward pressure on the pelvic organs.    Baseline Pt. demonstrates dysfunctional breathing and poor TA recruitment/awareness.    Time 5    Period Weeks    Status New    Target Date 03/10/20      PT SHORT TERM GOAL #2   Title Patient  will demonstrate improved pelvic alignment and balance of musculature surrounding the pelvis to facilitate decreased PFM spasms and decrease pelvic pain.    Baseline R anterior/L posterior pelvic obliquity and R thoracic scoliotic curve.    Time 10    Period Weeks    Status New    Target Date 04/14/20      PT SHORT TERM GOAL #3   Title Patient will demonstrate HEP x1 in the clinic to demonstrate understanding and proper form to allow for further improvement.    Baseline Pt. lacks knowledge of which therapeutic exercises can help decrease her pain/Sx.    Time 5    Period Weeks    Status New    Target Date 03/10/20      PT SHORT TERM GOAL #4   Title Pt. will demonstrate implementation of behavioral modifications such as fluid intake and use of Urge suppression technique to allow for decreased UUI/frequency.    Baseline Pt. having UUI daily, drinking a lot of Mtn. 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    Baseline Pt. having to change  clothes 3 times per day and does not eat if leaving the house due to [place]    Time 20    Period Weeks    Status New    Target Date 06/23/20      PT LONG TERM GOAL #2   Title Patient will describe pain no greater than 3/10 during standing or walking up to 30 min. to demonstrate improved functional ability.    Baseline pain increases to 6/10 (enough to warrant resting) after 15 min. of standing or walking.    Time 10    Period Weeks    Status New    Target Date 04/14/20      PT LONG TERM GOAL #3   Title Pt. will improve in FOTO score by 15 points in each category to demonstrate improved function.    Baseline FOTO PFDI Prolapse: 54 PFDI Bowel: 63, Urinary Problem: 44, Bowel Leakage 46, PFDI Urinary: 54, PFDI Pain 46.    Time 20    Period Weeks    Status New    Target Date 06/23/20                 Plan - 03/15/20 4008    Clinical Impression Statement Pt. Responded well to all interventions today, demonstrating improved ability to perform coordinated pelvic tilt in seated, improved pelvic alignment, as well as understanding and correct performance of all education and exercises provided today. They will continue to benefit from skilled physical therapy to work toward remaining goals and maximize function as well as decrease likelihood of symptom increase or recurrence.    PT Next Visit Plan re-assess LLD/heel-lift review HEP and D/C heel press and add kegels to urge-suppression if appropriate. Assess internally, Continue TPDN and correct for R anterior/L posterior rotation, give hip-flexor, side, and LB stretches.    PT Home Exercise Plan soda-can theory, belly breathing, urge suppression (breathing only), sit-to-stand  with exhale, supine-to-sit, pelvic tilt with PFM and TA, hook-lying pelvic tilts, R hip EXT/heel press, heel-lift for LLE, hip IR with yellow band, scapular retraction, chin-tucks.    Consulted and Agree with Plan of Care Patient           Patient will benefit from  skilled therapeutic intervention in order to improve the following deficits and impairments:     Visit Diagnosis: Other idiopathic scoliosis, thoracolumbar region  Other muscle spasm  Other lack of coordination  Sacrococcygeal disorders, not elsewhere classified     Problem List Patient Active Problem List   Diagnosis Date Noted  . Restless legs 10/24/2018  . Osteomyelitis of lumbar vertebra ([place]) 10/24/2018  . GERD (gastroesophageal reflux disease) 03/20/2018  . Lymphedema 12/25/2017  . Insomnia 12/18/2017  . Lung mass 03/13/2017  . Coin lesion 03/13/2017  . Advanced care planning/counseling discussion 03/13/2017  . Back pain at L4-L5 level 01/01/2017  . Multiple pulmonary nodules 01/01/2017  . Incidental pulmonary nodule, greater than or equal to 34mm 01/01/2017  . Bilateral leg edema 09/19/2016  . Normocytic normochromic anemia 08/03/2016  . Spondylolisthesis at L4-L5 level 07/10/2016  . Spinal stenosis of lumbar region with radiculopathy 04/27/2016  . Anxiety 04/27/2016  . Trochanteric bursitis of right hip 10/08/2015  . Hypertension 03/29/2015  . Hypercholesteremia 03/29/2015  . BMI 45.0-49.9, adult ([place]) 03/29/2015  . Arthralgia of hands, bilateral 03/29/2015   Willa Rough DPT, ATC Willa Rough 03/15/2020, 8:48 AM  [place] MAIN [facility] SERVICES [address] [place], [place], [address] Phone: [phone]   Fax:  [phone]  Name: TARRY BLAYNEY MRN: [id number] Date of Birth: [birth year]

## 2020-03-12 NOTE — Progress Notes (Signed)
Vonda Antigua, MD [address]  [place]  [place], [place] [address]  Main: [phone]  Fax: [phone]   Primary Care Physician: Guadalupe Maple, MD   Chief Complaint  Patient presents with   New Patient (Initial Visit)   Diarrhea    HPI: Tracy Espinoza is a 74 y.o. female here for follow-up of diarrhea which was thought to be due to pelvic floor dysfunction and fecal incontinence more than true diarrhea on last visit.  She was referred for pelvic floor physical therapy.  She states she saw them and they identified multiple issues, including pelvic floor issues that she needs to work on with them.  She also started Metamucil which has helped make her stool more formed and had less incontinence.  We also had her decrease her omeprazole from 80 mg daily to 40 mg daily on last visit.  She was taking 2 pills of the 40 mg together every morning previously.  She has not noticed any increase in heartburn since then.  No abdominal pain,  Nausea or vomiting, blood in stool, weight loss.  2015 colonoscopy with diverticulosis.  Current Outpatient Medications  Medication Sig Dispense Refill   acetaminophen (ACETAMINOPHEN 8 HOUR) 650 MG CR tablet Take 650 mg by mouth every 8 (eight) hours as needed for pain.      Ascorbic Acid (VITAMIN C) 1000 MG tablet Take 1,000 mg by mouth daily.     aspirin EC 81 MG tablet Take 81 mg by mouth daily.     Calcium Carb-Cholecalciferol (CALCIUM 600+D) 600-800 MG-UNIT TABS Take 1 tablet by mouth daily.      calcium carbonate (OS-CAL - DOSED IN MG OF ELEMENTAL CALCIUM) 1250 (500 Ca) MG tablet Take by mouth.     Cranberry 425 MG CAPS Take by mouth.     doxycycline (VIBRAMYCIN) 100 MG capsule Take 1 capsule (100 mg total) by mouth at bedtime. 90 capsule 4   gabapentin (NEURONTIN) 400 MG capsule Take up to 4 capsules daily as needed 360 capsule 1   hydrochlorothiazide (HYDRODIURIL) 25 MG tablet Take 1 tablet (25 mg total) by mouth daily. 90  tablet 4   LORazepam (ATIVAN) 0.5 MG tablet Take 1 tablet (0.5 mg total) by mouth daily. Take only for bad days 90 tablet 0   losartan (COZAAR) 100 MG tablet Take 1 tablet (100 mg total) by mouth daily. 90 tablet 1   metoprolol succinate (TOPROL-XL) 50 MG 24 hr tablet Take 1 tablet (50 mg total) by mouth daily. 90 tablet 4   Multiple Vitamin (MULTIVITAMIN) tablet Take 1 tablet by mouth daily.     nystatin (MYCOSTATIN/NYSTOP) powder Apply 1 application topically 2 (two) times daily. 60 g 1   nystatin cream (MYCOSTATIN) Apply 1 application topically 2 (two) times daily. 60 g 1   Omega-3 Fatty Acids (OMEGA-3 FISH OIL PO) Take 2 capsules by mouth daily.      Probiotic Product ([employer] DAILY PROBIOTIC PO) Take 1 capsule by mouth daily.      psyllium (METAMUCIL) 28 % packet Take 1 packet by mouth daily. 30 packet 2   traMADol (ULTRAM) 50 MG tablet Take 1 tablet (50 mg total) by mouth 2 (two) times daily as needed. 60 tablet 0   traZODone (DESYREL) 100 MG tablet TAKE 1 TABLET BY MOUTH AT  BEDTIME 90 tablet 0   loperamide (IMODIUM) 2 MG capsule Take 1 capsule daily for 7 days and then stop. 7 capsule 0   No current  facility-administered medications for this visit.    Allergies as of 03/10/2020 - Review Complete 03/10/2020  Allergen Reaction Noted   Tape Itching, Dermatitis, and Rash 11/10/2015   Daptomycin Other (See Comments) 08/21/2016   Band-aid plus antibiotic [bacitracin-polymyxin b] Other (See Comments) 08/06/2015   Iodinated diagnostic agents Itching 10/08/2015   Morphine sulfate Itching 01/22/2015   Vancomycin Rash 08/03/2016    ROS:  General: Negative for anorexia, weight loss, fever, chills, fatigue, weakness. ENT: Negative for hoarseness, difficulty swallowing , nasal congestion. CV: Negative for chest pain, angina, palpitations, dyspnea on exertion, peripheral edema.  Respiratory: Negative for dyspnea at rest, dyspnea on exertion, cough, sputum, wheezing.  GI: See  history of present illness. GU:  Negative for dysuria, hematuria, urinary incontinence, urinary frequency, nocturnal urination.  Endo: Negative for unusual weight change.    Physical Examination:   BP (!) 160/79    Pulse 73    Temp 98.1 F (36.7 C) (Oral)    Wt 274 lb (124.3 kg)    BMI 51.77 kg/m   General: Well-nourished, well-developed in no acute distress.  Eyes: No icterus. Conjunctivae pink. Mouth: Oropharyngeal mucosa moist and pink , no lesions erythema or exudate. Neck: Supple, Trachea midline Abdomen: Bowel sounds are normal, nontender, nondistended, no hepatosplenomegaly or masses, no abdominal bruits or hernia , no rebound or guarding.   Extremities: No lower extremity edema. No clubbing or deformities. Neuro: Alert and oriented x 3.  Grossly intact. Skin: Warm and dry, no jaundice.   Psych: Alert and cooperative, normal mood and affect.   Labs: CMP     Component Value Date/Time   NA 145 (H) 12/25/2019 1450   NA 138 10/23/2012 1520   K 3.8 12/25/2019 1450   K 3.9 10/23/2012 1520   CL 102 12/25/2019 1450   CL 101 10/23/2012 1520   CO2 27 12/25/2019 1450   CO2 32 10/23/2012 1520   GLUCOSE 97 12/25/2019 1450   GLUCOSE 110 (H) 08/22/2016 0543   GLUCOSE 101 (H) 10/23/2012 1520   BUN 21 12/25/2019 1450   BUN 28 (H) 10/23/2012 1520   CREATININE 0.77 12/25/2019 1450   CREATININE 0.81 10/23/2012 1520   CALCIUM 9.7 12/25/2019 1450   CALCIUM 9.8 10/23/2012 1520   PROT 6.6 12/25/2019 1450   ALBUMIN 4.5 12/25/2019 1450   AST 20 12/25/2019 1450   AST 28 10/24/2018 1136   ALT 17 12/25/2019 1450   ALT 21 10/24/2018 1136   ALKPHOS 36 (L) 12/25/2019 1450   BILITOT 0.6 12/25/2019 1450   GFRNONAA 76 12/25/2019 1450   GFRNONAA >60 10/23/2012 1520   GFRAA 88 12/25/2019 1450   GFRAA >60 10/23/2012 1520   Lab Results  Component Value Date   WBC 11.1 (H) 12/25/2019   HGB 15.0 12/25/2019   HCT 46.6 12/25/2019   MCV 96 12/25/2019   PLT 180 12/25/2019    Imaging  Studies: No results found.  Assessment and Plan:   Tracy Espinoza is a 74 y.o. y/o female here for follow-up of fecal incontinence  Continue follow-up with pelvic floor physical therapy closely Stop omeprazole as she has been on this chronically and decreasing dose has not led to increase in symptoms and this may help with her loose stools as well  Continue Metamucil once daily  Take Imodium once or twice a day for 7 to 10 days and then stop and only take as needed about once or twice a week  Follow-up in clinic to reassess symptoms  No alarm  symptoms present at this time   Dr Vonda Antigua

## 2020-03-15 ENCOUNTER — Ambulatory Visit: Payer: Medicare Other

## 2020-03-15 ENCOUNTER — Other Ambulatory Visit: Payer: Self-pay | Admitting: Family Medicine

## 2020-03-15 ENCOUNTER — Other Ambulatory Visit: Payer: Self-pay

## 2020-03-15 DIAGNOSIS — M4125 Other idiopathic scoliosis, thoracolumbar region: Secondary | ICD-10-CM

## 2020-03-15 DIAGNOSIS — M62838 Other muscle spasm: Secondary | ICD-10-CM

## 2020-03-15 DIAGNOSIS — F419 Anxiety disorder, unspecified: Secondary | ICD-10-CM

## 2020-03-15 DIAGNOSIS — M533 Sacrococcygeal disorders, not elsewhere classified: Secondary | ICD-10-CM

## 2020-03-15 DIAGNOSIS — R278 Other lack of coordination: Secondary | ICD-10-CM

## 2020-03-15 NOTE — Telephone Encounter (Signed)
Requested medication (s) are due for refill today: yes  Requested medication (s) are on the active medication list: yes  Last refill: 12/13/19  Future visit scheduled: yes  Notes to clinic:  not delegated    Requested Prescriptions  Pending Prescriptions Disp Refills   LORazepam (ATIVAN) 0.5 MG tablet [Pharmacy Med Name: LORAZEPAM  0.5MG   TAB] 90 tablet     Sig: TAKE 1 TABLET BY MOUTH  DAILY . TAKE ONLY FOR BAD  DAYS.      Not Delegated - Psychiatry:  Anxiolytics/Hypnotics Failed - 03/15/2020 10:09 AM      Failed - This refill cannot be delegated      Failed - Urine Drug Screen completed in last 360 days.      Passed - Valid encounter within last 6 months    Recent Outpatient Visits           1 month ago Diarrhea, unspecified type   [facility] [place], [place]   2 months ago Diarrhea, unspecified type   [facility], [place], [place]   2 months ago Diarrhea, unspecified type   [place], [place], [place]   4 months ago Essential hypertension   [place], [place], [place]   5 months ago Acute lower UTI   [facility], [place], [place]       Future Appointments             In 4 months [facility], [place], [place], [place]

## 2020-03-15 NOTE — Patient Instructions (Signed)
   1) when you do your chin-tucks: roll up a small towel and put it behind your head on the recliner and gently push into it as you breath out and slide your head straight back like a turtle pulling into its shell.  2) Try doing your shoulder retractions in standing to see if it feels like you are getting a better squeeze between your shoulders.  3)  Shift your weight to one leg and pick the other up just slightly. Hold for a couple seconds and then go to the other side. Do 5 for each side. As it gets stronger and easier, you can hold for a little longer each time. Make sure to have a chair behind you and a counter or couch in front of you.  4) When walking, focus on keeping your shoulder back and down and eyes forward!

## 2020-03-15 NOTE — Telephone Encounter (Signed)
Lvm for pt to call back. 

## 2020-03-15 NOTE — Telephone Encounter (Signed)
Lvm to make this apt. 

## 2020-03-15 NOTE — Telephone Encounter (Signed)
Needs follow up on anxiety

## 2020-03-15 NOTE — Telephone Encounter (Signed)
Routing to provider  
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
## 2020-03-16 NOTE — Telephone Encounter (Signed)
Appt scheduled for tomorrow.  °

## 2020-03-17 ENCOUNTER — Ambulatory Visit: Payer: Medicare Other

## 2020-03-17 ENCOUNTER — Encounter: Payer: Self-pay | Admitting: Family Medicine

## 2020-03-17 ENCOUNTER — Other Ambulatory Visit: Payer: Self-pay

## 2020-03-17 ENCOUNTER — Ambulatory Visit (INDEPENDENT_AMBULATORY_CARE_PROVIDER_SITE_OTHER): Payer: Medicare Other | Admitting: Family Medicine

## 2020-03-17 VITALS — Wt 274.0 lb

## 2020-03-17 DIAGNOSIS — F419 Anxiety disorder, unspecified: Secondary | ICD-10-CM | POA: Diagnosis not present

## 2020-03-17 DIAGNOSIS — M4125 Other idiopathic scoliosis, thoracolumbar region: Secondary | ICD-10-CM

## 2020-03-17 DIAGNOSIS — M533 Sacrococcygeal disorders, not elsewhere classified: Secondary | ICD-10-CM

## 2020-03-17 DIAGNOSIS — R278 Other lack of coordination: Secondary | ICD-10-CM

## 2020-03-17 DIAGNOSIS — M62838 Other muscle spasm: Secondary | ICD-10-CM

## 2020-03-17 MED ORDER — SERTRALINE HCL 50 MG PO TABS: 50.0000 mg | ORAL_TABLET | Freq: Every day | ORAL | 0 refills | Status: DC

## 2020-03-17 MED ORDER — SERTRALINE HCL 50 MG PO TABS: 50.0000 mg | ORAL_TABLET | Freq: Every day | ORAL | 1 refills | Status: DC

## 2020-03-17 NOTE — Therapy (Signed)
[place] MAIN [facility] [address] [place], [place], [address] Phone: [phone]   Fax:  [phone]  Physical Therapy Treatment  The patient has been informed of current processes in place at Outpatient Rehab to protect patients from Covid-19 exposure including social distancing, schedule modifications, and new cleaning procedures. After discussing their particular risk with a therapist based on the patient's personal risk factors, the patient has decided to proceed with in-person therapy.   Patient Details  Name: Tracy Espinoza MRN: [id number] Date of Birth: 1946/02/12 No data recorded  Encounter Date: 03/17/2020   PT End of Session - 03/17/20 1510    Visit Number 9    Number of Visits 20    Date for PT Re-Evaluation 04/14/20    Authorization Type Medicare    Authorization Time Period 5/5 through 7/14    Authorization - Visit Number 9    Authorization - Number of Visits 20    Progress Note Due on Visit 10    PT Start Time 1330    PT Stop Time 1430    PT Time Calculation (min) 60 min    Activity Tolerance Patient tolerated treatment well;No increased pain    Behavior During Therapy WFL for tasks assessed/performed           Past Medical History:  Diagnosis Date  . Anxiety   . Arthritis   . Cancer ([place])    BASAL CELL SKIN CANCERS REMOVED  . Depression   . Edema    FEET/LEGS  . GERD (gastroesophageal reflux disease)   . Heart murmur   . History of hiatal hernia   . Hypertension   . Lymphedema   . MRSA (methicillin resistant staph aureus) culture positive    H/O    Past Surgical History:  Procedure Laterality Date  . ABDOMINAL HYSTERECTOMY    . APPENDECTOMY    . BACK SURGERY    . CATARACT EXTRACTION W/PHACO Left 03/28/2018   Procedure: CATARACT EXTRACTION PHACO AND INTRAOCULAR LENS PLACEMENT (IOC);  Surgeon: Eulogio Bear, MD;  Location: [facility];  Service: Ophthalmology;  Laterality: Left;  [place] 00:28.2 AP%  7.9 CDE 2.21 Fluid pack lot # [id number]  . CATARACT EXTRACTION W/PHACO Right 05/16/2018   Procedure: CATARACT EXTRACTION PHACO AND INTRAOCULAR LENS PLACEMENT (IOC);  Surgeon: Eulogio Bear, MD;  Location: [facility];  Service: Ophthalmology;  Laterality: Right;  [place] 00:26.2 AP% 7.6 CDE 2.01 Fluid pack lot # [id number]  . CHOLECYSTECTOMY    . COLONOSCOPY    . FRACTURE SURGERY     BONE ON SIDE OF RIGHT FOOT  . HERNIA REPAIR     UMBILICAL X 2  . JOINT REPLACEMENT Bilateral    knees  . KIDNEY SURGERY    . LUMBAR WOUND DEBRIDEMENT N/A 08/03/2016   Procedure: IRRIGATION AND DEBRIDEMENT LUMBAR WOUND;  Surgeon: Eustace Moore, MD;  Location: [place];  Service: Neurosurgery;  Laterality: N/A;  . OVARIAN CYST SURGERY    . REPAIR OF LEFT URETER Left 40 YRS AGO  . ROTATOR CUFF REPAIR  2014  . TONSILLECTOMY    . TUBAL LIGATION      There were no vitals filed for this visit.  Pelvic Floor Physical Therapy Treatment Note  SCREENING  Changes in medications, allergies, or medical history?: none    SUBJECTIVE  Patient reports: Gets a pain in the L heel that she has noticed for a while that comes on in the middle of the night and  wakes her up, it goes away when she stands up and puts weight on it. Did the weight shift, chin-tucks, shoulder squeezes, and sit-to-stands yesterday. Diarrhea seems to be a little better, less frequent and the consistency is more solid,  Has only had to change clothes ~ 2 times in the last couple days. Had pain yesterday, however, and her R hip is bothering her today some mostly when she was sitting in her recliner/lift-chair. Did try changing her pillows the way we discussed and it seems to be doing ok, she has one "squished down" in the small of her back and the other horizontal at the thoracic spine. Her head is able to rest still on the head rest.  Precautions:  Abdominal hysterectomy, Lymphedema (diagnosed ~ 3 years ago) (needs to be wearing pressure boots) L4-5 fusion  in 2017.  Location of pain: R hip, LLE Current pain: 0/10  (2/10 this morning) Max pain: 4/10 Least pain: 0/10 Nature of pain:"grinding" sore/achy  Patient Goals: Would like to get a handle on her diarrhea so it is not so liquid and she can control it better. Would like to be able to go out to shop or go to church without fear of FI. Not have to change clothes more than 1 time per day due to leakage. Stool does seem to be more solid than it was, Thinks that the mucinex is making the difference. Urine incontinence has been terrible the last two days. When she wakes up from sleep/nap she has as a large accident. The worst is when she gets up from doing her "pressure boots".    OBJECTIVE  Changes in: Posture/Observations:  Hyperlordosis/kyphosis, FHP, R anterior/L posterior pelvic obliquity and R thoracic scoliosis.  L ASIS high in standing with heel-lift in place.  Range of Motion/Flexibilty:  Decreased mobility and pain with pressure at T/L junction and R costovertebral junction.   Strength/MMT:  LE MMT: Pt. Able to bring her RLE up onto the mat independently today and stood from mat without hands or support!  R glutes do not want to fire due to spasm, improved recruitment following release  4+ for knee EXT and flex, 4/5 for hip flex, DF, hip EXT  Pelvic floor: Pt. States she can feel PFM contracting when she performs slight posterior pelvic tilt first.  Abdominal:  10 finger diastasis, narrows to 8 with exhale/posterior pelvic tilt. (from prior session)  Today: Pt. Finally able to consistently demonstrate seated pelvic tilt coordination after use of mirror for feedback with tilts in combination with verbal cue to flatten low back.  Palpation: TTP to  B multifidus and erector spinae  Gait Analysis: Pt. Is ~ 25% improved in her posture with intent to look up more with AMB and ablility to reach ~ 50% improvement with Min. Cueing.  INTERVENTIONS THIS SESSION:  Therex:  Reviewed and practice scapular retractions to improve performance and efficacy of exercise to improve posture and decrease Sx.  Manual: Performed TP release to B multifidus and erector spinae across the T/L junction followed by grade 2-3 PA and R to L mobs at T/L junction and R costovertebral junction to decrease spasm and pain and allow for improved balance of musculature for improved function and decreased symptoms.   E-Stim: Performed TPDN with E-stim to the T-L junction with a frequency of 2 and muscle-twitch intensity (~ 4) for 15 min. To stimulate the hypogastric nerve and decrease spasms across the junction to help further improve PFM and bowel/bladder control for decreased  diarrhea, FI, and SUI.  Dry-needle: Performed TPDN with 4 .30x77mm needles and standard approach as described below to decrease spasm and pain and allow for improved balance of musculature for improved function and decreased symptoms.   Total time: 60                      Trigger Point Dry Needling - 03/17/20 0001    Consent Given? Yes    Education Handout Provided No    Muscles Treated Back/Hip Erector spinae;Lumbar multifidi;Thoracic multifidi    Dry Needling Comments Bilateral    Electrical Stimulation Performed with Dry Needling Yes    Erector spinae Response Twitch response elicited;Palpable increased muscle length    Lumbar multifidi Response Twitch response elicited;Palpable increased muscle length    Thoracic multifidi response Twitch response elicited;Palpable increased muscle length                  PT Short Term Goals - 02/04/20 1421      PT SHORT TERM GOAL #1   Title Patient will demonstrate functional recruitment of TA with breathing, sit-to-stand, squatting/lifting, and walking to allow for improved pelvic brace coordination, improved balance, and decreased downward pressure on the pelvic organs.    Baseline Pt. demonstrates dysfunctional breathing and poor TA  recruitment/awareness.    Time 5    Period Weeks    Status New    Target Date 03/10/20      PT SHORT TERM GOAL #2   Title Patient will demonstrate improved pelvic alignment and balance of musculature surrounding the pelvis to facilitate decreased PFM spasms and decrease pelvic pain.    Baseline R anterior/L posterior pelvic obliquity and R thoracic scoliotic curve.    Time 10    Period Weeks    Status New    Target Date 04/14/20      PT SHORT TERM GOAL #3   Title Patient will demonstrate HEP x1 in the clinic to demonstrate understanding and proper form to allow for further improvement.    Baseline Pt. lacks knowledge of which therapeutic exercises can help decrease her pain/Sx.    Time 5    Period Weeks    Status New    Target Date 03/10/20      PT SHORT TERM GOAL #4   Title Pt. will demonstrate implementation of behavioral modifications such as fluid intake and use of Urge suppression technique to allow for decreased UUI/frequency.    Baseline Pt. having UUI daily, drinking a lot of Mtn. 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    Baseline Pt. having to change clothes 3 times per day and does not eat if leaving the house due to [place]    Time 20    Period Weeks    Status New    Target Date 06/23/20      PT LONG TERM GOAL #2   Title Patient will describe pain no greater than 3/10 during standing or walking up to 30 min. to  demonstrate improved functional ability.    Baseline pain increases to 6/10 (enough to warrant resting) after 15 min. of standing or walking.    Time 10    Period Weeks    Status New    Target Date 04/14/20      PT LONG TERM  GOAL #3   Title Pt. will improve in FOTO score by 15 points in each category to demonstrate improved function.    Baseline FOTO PFDI Prolapse: 54 PFDI Bowel: 63, Urinary Problem: 44, Bowel Leakage 46, PFDI Urinary: 54, PFDI Pain 46.    Time 20    Period Weeks    Status New    Target Date 06/23/20                 Plan - 03/17/20 1510    Clinical Impression Statement Pt. Responded well to all interventions today, demonstrating improved posture with AMB upon arrival, decrease in FI, decreased spasm and improved ability to recruit scapular retractors/downward rotators as well as understanding and correct performance of all education and exercises provided today. They will continue to benefit from skilled physical therapy to work toward remaining goals and maximize function as well as decrease likelihood of symptom increase or recurrence.    PT Next Visit Plan further thoracic mobility, re-assess LLD/heel-lift review HEP and D/C heel press and add kegels to urge-suppression if appropriate. Assess internally, Continue TPDN and correct for R anterior/L posterior rotation, give hip-flexor, side, and LB stretches.    PT Home Exercise Plan soda-can theory, belly breathing, urge suppression (breathing only), sit-to-stand with exhale, supine-to-sit, pelvic tilt with PFM and TA, hook-lying pelvic tilts, R hip EXT/heel press, heel-lift for LLE, hip IR with yellow band, scapular retraction, chin-tucks. weight-shift balances    Consulted and Agree with Plan of Care Patient           Patient will benefit from skilled therapeutic intervention in order to improve the following deficits and impairments:     Visit Diagnosis: Other idiopathic scoliosis, thoracolumbar region  Other muscle spasm  Other lack of coordination  Sacrococcygeal disorders, not elsewhere classified     Problem List Patient Active Problem List   Diagnosis Date Noted  . Restless legs 10/24/2018  . 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 GERD (gastroesophageal reflux disease) 03/20/2018  . Lymphedema 12/25/2017  . Insomnia 12/18/2017  . Lung mass 03/13/2017  . Coin lesion 03/13/2017  . Advanced care planning/counseling discussion 03/13/2017  . Back pain at L4-L5 level 01/01/2017  . Multiple pulmonary nodules 01/01/2017  . Incidental pulmonary nodule, greater than or equal to 52mm 01/01/2017  . Bilateral leg edema 09/19/2016  . Normocytic normochromic anemia 08/03/2016  . Spondylolisthesis at L4-L5 level 07/10/2016  . Spinal stenosis of lumbar region with radiculopathy 04/27/2016  . Anxiety 04/27/2016  . Trochanteric bursitis of right hip 10/08/2015  . Hypertension 03/29/2015  . Hypercholesteremia 03/29/2015  . BMI 45.0-49.9, adult ([place]) 03/29/2015  . Arthralgia of hands, bilateral 03/29/2015   Willa Rough DPT, ATC Willa Rough 03/17/2020, 3:11 PM  [place] MAIN [facility] SERVICES [address] [place], [place], [address] Phone: [phone]   Fax:  [phone]  Name: LILLIANNE EICK MRN: [id number] Date of Birth: Dec 22, 1945

## 2020-03-17 NOTE — Progress Notes (Signed)
Wt 274 lb (124.3 kg)   BMI 51.77 kg/m    Subjective:    Patient ID: Tracy Espinoza, female    DOB: 1946-06-05, 74 y.o.   MRN: [id number]  HPI: Tracy Espinoza is a 74 y.o. female  Chief Complaint  Patient presents with  . Anxiety    . This visit was completed via MyChart due to the restrictions of the COVID-19 pandemic. All issues as above were discussed and addressed. Physical exam was done as above through visual confirmation on MyChart. If it was felt that the patient should be evaluated in the office, they were directed there. The patient verbally consented to this visit. . Location of the patient: home . Location of the provider: work . Those involved with this call:  . Provider: Merrie Roof, PA-C . CMA: Lesle Chris, [place] . Front Desk/Registration: Jill Side  . Time spent on call: 15 minutes with patient face to face via video conference. More than 50% of this time was spent in counseling and coordination of care. 5 minutes total spent in review of patient's record and preparation of their chart. I verified patient identity using two factors (patient name and date of birth). Patient consents verbally to being seen via telemedicine visit today.   Here today for anxiety f/u. Feeling the zoloft is benefiting her significantly. Taking the lorazepam maybe 10 times per month, sometimes more depending on the situations going on. Denies ongoing mood concerns, frequent panic episodes, side effects to the medications, SI/HI.   Depression screen [facility] 2/9 03/17/2020 11/05/2019 03/26/2019  Decreased Interest 0 1 0  Down, Depressed, Hopeless 0 2 3  PHQ - 2 Score 0 3 3  Altered sleeping 1 0 3  Tired, decreased energy 1 2 0  Change in appetite 0 1 0  Feeling bad or failure about yourself  0 0 1  Trouble concentrating 0 0 0  Moving slowly or fidgety/restless 0 0 0  Suicidal thoughts 0 0 0  PHQ-9 Score 2 6 7   Difficult doing work/chores - - Not difficult at all   GAD 7 : Generalized  Anxiety Score 03/17/2020 11/05/2019  Nervous, Anxious, on Edge 1 1  Control/stop worrying 0 1  Worry too much - different things 0 0  Trouble relaxing 0 1  Restless 0 0  Easily annoyed or irritable 0 0  Afraid - awful might happen 0 1  Total GAD 7 Score 1 4  Anxiety Difficulty - Somewhat difficult     Relevant past medical, surgical, family and social history reviewed and updated as indicated. Interim medical history since our last visit reviewed. Allergies and medications reviewed and updated.  Review of Systems  Per HPI unless specifically indicated above     Objective:    Wt 274 lb (124.3 kg)   BMI 51.77 kg/m   Wt Readings from Last 3 Encounters:  03/17/20 274 lb (124.3 kg)  03/10/20 274 lb (124.3 kg)  02/09/20 274 lb (124.3 kg)    Physical Exam Vitals and nursing note reviewed.  Constitutional:      General: She is not in acute distress.    Appearance: Normal appearance.  HENT:     Head: Atraumatic.     Right Ear: External ear normal.     Left Ear: External ear normal.     Nose: Nose normal. No congestion.     Mouth/Throat:     Mouth: Mucous membranes are moist.     Pharynx: Oropharynx is clear. No  posterior oropharyngeal erythema.  Eyes:     Extraocular Movements: Extraocular movements intact.     Conjunctiva/sclera: Conjunctivae normal.  Cardiovascular:     Comments: Unable to assess via virtual visit Pulmonary:     Effort: Pulmonary effort is normal. No respiratory distress.  Musculoskeletal:        General: Normal range of motion.     Cervical back: Normal range of motion.  Skin:    General: Skin is dry.     Findings: No erythema.  Neurological:     Mental Status: She is alert and oriented to person, place, and time.  Psychiatric:        Mood and Affect: Mood normal.        Thought Content: Thought content normal.        Judgment: Judgment normal.     Results for orders placed or performed in visit on 01/14/20  Pancreatic elastase, fecal    Result Value Ref Range   Pancreatic Elastase, Fecal 461 >200 ug Elast./g  Celiac Disease Ab Screen w/Rfx  Result Value Ref Range   Antigliadin Abs, IgA 4 0 - 19 units   Transglutaminase IgA <2 0 - 3 U/mL   IgA/Immunoglobulin A, Serum 230 64 - 422 mg/dL      Assessment & Plan:   Problem List Items Addressed This Visit      Other   Anxiety - Primary    Stable and well controlled, continue current regimen with sparing use of ativan      Relevant Medications   sertraline (ZOLOFT) 50 MG tablet       Follow up plan: Return in about 3 months (around 06/17/2020) for 6 month f/u.

## 2020-03-22 ENCOUNTER — Other Ambulatory Visit: Payer: Self-pay

## 2020-03-22 ENCOUNTER — Ambulatory Visit: Payer: Medicare Other

## 2020-03-22 DIAGNOSIS — R278 Other lack of coordination: Secondary | ICD-10-CM

## 2020-03-22 DIAGNOSIS — M4125 Other idiopathic scoliosis, thoracolumbar region: Secondary | ICD-10-CM

## 2020-03-22 DIAGNOSIS — M533 Sacrococcygeal disorders, not elsewhere classified: Secondary | ICD-10-CM

## 2020-03-22 DIAGNOSIS — M62838 Other muscle spasm: Secondary | ICD-10-CM

## 2020-03-22 NOTE — Assessment & Plan Note (Signed)
Stable and well controlled, continue current regimen with sparing use of ativan

## 2020-03-22 NOTE — Patient Instructions (Signed)
  Reach to the side and breathe 5 deep breaths, use the last exhale to come back to the center. Repeat 2-3 times on each side (more on one side if it is tighter) do this once per day.   Take a long step, tuck the pelvis under (flatten low back) and then bend the knee slightly until you feel the stretch on the front of the back leg. Hold for 5 deep breaths, repeat 2-3 times on each side (more on the tighter side).    Tuck your hips under, then keep the tuck as you lean forward so you feel a stretch through your low back. Hold for 5 deep breaths, repeat 2-3 times, 1-2 times per day.   Soften your knees and breathe out as you tilt the pelvis under (posterior aka keep the water in the bucket)   Shift your weight to one leg and hold for a couple seconds (more as you get stronger), then return to the middle and repeat on the opposite side. Do 10 on each leg, 1-2 times per day.

## 2020-03-22 NOTE — Therapy (Signed)
[place] MAIN [facility] SERVICES [address] [place], [place], [address] Phone: [phone]   Fax:  [phone]  Physical Therapy Progress Note   Dates of reporting period  5/5   to   6/21   The patient has been informed of current processes in place at Outpatient Rehab to protect patients from Covid-19 exposure including social distancing, schedule modifications, and new cleaning procedures. After discussing their particular risk with a therapist based on the patient's personal risk factors, the patient has decided to proceed with in-person therapy.   Patient Details  Name: Tracy Espinoza MRN: [id number] Date of Birth: 01/03/1946 No data recorded  Encounter Date: 03/22/2020   PT End of Session - 03/24/20 1320    Visit Number 10    Number of Visits 20    Date for PT Re-Evaluation 04/14/20    Authorization Type Medicare    Authorization Time Period 5/5 through 7/14    Authorization - Visit Number 10    Authorization - Number of Visits 20    Progress Note Due on Visit 10    PT Start Time 1400    PT Stop Time 1500    PT Time Calculation (min) 60 min    Activity Tolerance Patient tolerated treatment well;No increased pain    Behavior During Therapy WFL for tasks assessed/performed           Past Medical History:  Diagnosis Date  . Anxiety   . Arthritis   . Cancer ([place])    BASAL CELL SKIN CANCERS REMOVED  . Depression   . Edema    FEET/LEGS  . GERD (gastroesophageal reflux disease)   . Heart murmur   . History of hiatal hernia   . Hypertension   . Lymphedema   . MRSA (methicillin resistant staph aureus) culture positive    H/O    Past Surgical History:  Procedure Laterality Date  . ABDOMINAL HYSTERECTOMY    . APPENDECTOMY    . BACK SURGERY    . CATARACT EXTRACTION W/PHACO Left 03/28/2018   Procedure: CATARACT EXTRACTION PHACO AND INTRAOCULAR LENS PLACEMENT (IOC);  Surgeon: Eulogio Bear, MD;  Location: [facility] ORS;   Service: Ophthalmology;  Laterality: Left;  [place] 00:28.2 AP% 7.9 CDE 2.21 Fluid pack lot # [id number]  . CATARACT EXTRACTION W/PHACO Right 05/16/2018   Procedure: CATARACT EXTRACTION PHACO AND INTRAOCULAR LENS PLACEMENT (IOC);  Surgeon: Eulogio Bear, MD;  Location: [facility] ORS;  Service: Ophthalmology;  Laterality: Right;  [place] 00:26.2 AP% 7.6 CDE 2.01 Fluid pack lot # [id number]  . CHOLECYSTECTOMY    . COLONOSCOPY    . FRACTURE SURGERY     BONE ON SIDE OF RIGHT FOOT  . HERNIA REPAIR     UMBILICAL X 2  . JOINT REPLACEMENT Bilateral    knees  . KIDNEY SURGERY    . LUMBAR WOUND DEBRIDEMENT N/A 08/03/2016   Procedure: IRRIGATION AND DEBRIDEMENT LUMBAR WOUND;  Surgeon: Eustace Moore, MD;  Location: [facility];  Service: Neurosurgery;  Laterality: N/A;  . OVARIAN CYST SURGERY    . REPAIR OF LEFT URETER Left 40 YRS AGO  . ROTATOR CUFF REPAIR  2014  . TONSILLECTOMY    . TUBAL LIGATION      There were no vitals filed for this visit.  Pelvic Floor Physical Therapy Treatment Note  SCREENING  Changes in medications, allergies, or medical history?: none    SUBJECTIVE  Patient reports: Had an accident this morning, all she ate  was an english muffin. Had her Metamucil first. Feels like her balance is doing better, was able to walk from the living room to the bathroom without holding onto anything and was able to carry things from the kitchen to the bar rather than placing it from one surface to another so she can walk with hands free. Has still had pain in the L heel that starts to hurt after she has been asleep for a while.  Precautions:  Abdominal hysterectomy, Lymphedema (diagnosed ~ 3 years ago) (needs to be wearing pressure boots) L4-5 fusion in 2017.  Location of pain: low back Current pain: 0/10  Max pain: 4/10  Least pain: 0/10  Nature of pain:"grinding" sore/achy  Patient Goals: Would like to get a handle on her diarrhea so it is not so liquid and she can control it better.  Would like to be able to go out to shop or go to church without fear of FI. Not have to change clothes more than 1 time per day due to leakage. Stool does seem to be more solid than it was, Thinks that the mucinex is making the difference. Urine incontinence has been terrible the last two days. When she wakes up from sleep/nap she has as a large accident. The worst is when she gets up from doing her "pressure boots".    OBJECTIVE  Changes in: Posture/Observations:  Hyperlordosis/kyphosis, FHP, R thoracic scoliosis.  **PSIS level with heel-lift in place  Range of Motion/Flexibilty:   Strength/MMT:  4+ for knee EXT and flex, 4/5 for hip flex, DF, hip EXT(from prior session)  Pelvic floor: Pt. States she can feel PFM contracting when she performs slight posterior pelvic tilt first. (from prior note)  Abdominal:  10 finger diastasis, narrows to 8 with exhale/posterior pelvic tilt. (from prior session)  Today: Pt. Finally able to consistently demonstrate pelvic tilt coordination in multiple positions (sitting, standing, standing with long step.  Palpation:   Gait Analysis: Pt. Is  50% improvement without Cueing.  INTERVENTIONS THIS SESSION:  Therex: Educated on and practiced side-stretches, standing hip-flexor stretch and a low back stretch and practiced standing pelvic tilts and weight-shifts with tilt to improve timing to improve balance of musculature surround the pelvis and take pressure off of pelvic nerves as well as improve deep core endurance and timing for decreased Sx.    Total time: 60                              PT Short Term Goals - 03/24/20 1331      PT SHORT TERM GOAL #1   Title Patient will demonstrate functional recruitment of TA with breathing, sit-to-stand, squatting/lifting, and walking to allow for improved pelvic brace coordination, improved balance, and decreased downward pressure on the pelvic organs.    Baseline Pt. demonstrates  dysfunctional breathing and poor TA recruitment/awareness.    Time 5    Period Weeks    Status Achieved    Target Date 03/10/20      PT SHORT TERM GOAL #2   Title Patient will demonstrate improved pelvic alignment and balance of musculature surrounding the pelvis to facilitate decreased PFM spasms and decrease pelvic pain.    Baseline R anterior/L posterior pelvic obliquity and R thoracic scoliotic curve. As of 6/23: is ~ 50% better. and is feeling like she has better balance.    Time 10    Period Weeks    Status On-going  Target Date 04/14/20      PT SHORT TERM GOAL #3   Title Patient will demonstrate HEP x1 in the clinic to demonstrate understanding and proper form to allow for further improvement.    Baseline Pt. lacks knowledge of which therapeutic exercises can help decrease her pain/Sx.    Time 5    Period Weeks    Status Achieved    Target Date 03/10/20      PT SHORT TERM GOAL #4   Title Pt. will demonstrate implementation of behavioral modifications such as fluid intake and use of Urge suppression technique to allow for decreased UUI/frequency.    Baseline Pt. having UUI daily, drinking a lot of Mtn. Dew    Time 5    Period Weeks    Status New    Target Date 03/10/20             PT Long Term Goals - 03/24/20 1441      PT LONG TERM GOAL #1   Title Patient will report no episodes of Fecal Incontinence over the past two weeks to demonstrate improved function and quality of life and to allow her to participate in going to church and shopping as part of her occupation.    Baseline Pt. having to change clothes 3 times per day and does not eat if leaving the house due to [place]. As of 6/23: Pt. only changing 1-2 times per day but still does not feel like she can eat if she has to leave the house.    Time 20    Period Weeks    Status On-going    Target Date 06/23/20      PT LONG TERM GOAL #2   Title Patient will describe pain no greater than 3/10 during standing or walking  up to 30 min. to demonstrate improved functional ability.    Baseline pain increases to 6/10 (enough to warrant resting) after 15 min. of standing or walking. As of 6/23: pain 4/10 at greatest with ADL's but has not walked for exercise yet.    Time 10    Period Weeks    Status On-going    Target Date 04/14/20      PT LONG TERM GOAL #3   Title Pt. will improve in FOTO score by 15 points in each category to demonstrate improved function.    Baseline FOTO PFDI Prolapse: 54 PFDI Bowel: 63, Urinary Problem: 44, Bowel Leakage 46, PFDI Urinary: 54, PFDI Pain 46.    Time 20    Period Weeks    Status On-going    Target Date 06/23/20                 Plan - 03/24/20 1623    Clinical Impression Statement Pt. has met or made significant progress toward all short term goals and is working slowly toward her long term goals. She feels that her balance has improved significantly and when she is home she is feeling like she can walk short distances from the kitchen to the bar and the bar to her chair with both hands full, something she did not even try to do before based on fear of falling. She is not having to change clothes as many times per day due to FI and has success sometimes when using her urge suppression techniques, admits to only remembering to try ~ 50-60% of the time. She has improved in her ability to perform bed mobility and is having less pain. She requires repetition  and min to mod cueing for performance with new exercises but is able to perform older HEP exercises IND and responded well to all treatment today, consistently finding her TA in different positions to allow for success with standing pelvic tilts, hip-flexor stretch, etc. She will continue to benefit from skilled pelvic health PT To build on success and further strengthen through the core and BLE to improve balance and allow for decreased pressure on the PFM once other muscles are stabilizing pelvis better to help decrease FI and  SUI.    Personal Factors and Comorbidities Age;Comorbidity 3+;Fitness;Time since onset of injury/illness/exacerbation;Social Background    Examination-Activity Limitations Squat;Stairs;Stand;Bend;Continence;Toileting    Examination-Participation Restrictions Church;Community Activity;Cleaning;Laundry;Meal Prep    PT Next Visit Plan further thoracic mobility, review hip-flexor stretch, side-stretch, LB stretch, standing pelvic tilts, weight shifts and add kegels to urge-suppression if appropriate. Assess internally when appropriate    PT Home Exercise Plan soda-can theory, belly breathing, urge suppression (breathing only), sit-to-stand with exhale, supine-to-sit, pelvic tilt with PFM and TA, hook-lying pelvic tilts, R hip EXT/heel press, heel-lift for LLE, hip IR with yellow band, scapular retraction, chin-tucks. weight-shift balances    Consulted and Agree with Plan of Care Patient           Patient will benefit from skilled therapeutic intervention in order to improve the following deficits and impairments:  Abnormal gait, Decreased balance, Decreased endurance, Decreased mobility, Difficulty walking, Increased muscle spasms, Obesity, Improper body mechanics, Increased edema, Decreased scar mobility, Decreased range of motion, Decreased activity tolerance, Decreased coordination, Decreased strength, Increased fascial restricitons, Impaired flexibility, Postural dysfunction, Pain  Visit Diagnosis: Other idiopathic scoliosis, thoracolumbar region  Other muscle spasm  Other lack of coordination  Sacrococcygeal disorders, not elsewhere classified     Problem List Patient Active Problem List   Diagnosis Date Noted  . Restless legs 10/24/2018  . 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 GERD (gastroesophageal reflux disease) 03/20/2018  . Lymphedema 12/25/2017  . Insomnia 12/18/2017  . Lung mass 03/13/2017  . Coin lesion 03/13/2017  . Advanced care planning/counseling  discussion 03/13/2017  . Back pain at L4-L5 level 01/01/2017  . Multiple pulmonary nodules 01/01/2017  . Incidental pulmonary nodule, greater than or equal to 55m 01/01/2017  . Bilateral leg edema 09/19/2016  . Normocytic normochromic anemia 08/03/2016  . Spondylolisthesis at L4-L5 level 07/10/2016  . Spinal stenosis of lumbar region with radiculopathy 04/27/2016  . Anxiety 04/27/2016  . Trochanteric bursitis of right hip 10/08/2015  . Hypertension 03/29/2015  . Hypercholesteremia 03/29/2015  . BMI 45.0-49.9, adult ([place] 03/29/2015  . Arthralgia of hands, bilateral 03/29/2015   KWilla RoughDPT, ATC KWilla Rough6/23/2021, 4:37 PM  [place] [place] [facility] [address] [address]Phone: [phone]  Fax:  [phone] Name: DTERRISA CURFMANMRN: [id number] of Birth: 5Jan 26, 1947

## 2020-03-23 ENCOUNTER — Telehealth: Payer: Self-pay | Admitting: Family Medicine

## 2020-03-23 NOTE — Telephone Encounter (Signed)
lvm to make this 3 month f.u

## 2020-03-23 NOTE — Telephone Encounter (Signed)
-----   Message from Volney American, [place] sent at 03/22/2020  4:33 AM EDT ----- 3 months for 6 month f/u

## 2020-03-24 ENCOUNTER — Ambulatory Visit: Payer: Medicare Other

## 2020-03-24 ENCOUNTER — Other Ambulatory Visit: Payer: Self-pay

## 2020-03-24 DIAGNOSIS — M533 Sacrococcygeal disorders, not elsewhere classified: Secondary | ICD-10-CM

## 2020-03-24 DIAGNOSIS — M4125 Other idiopathic scoliosis, thoracolumbar region: Secondary | ICD-10-CM | POA: Diagnosis not present

## 2020-03-24 DIAGNOSIS — R278 Other lack of coordination: Secondary | ICD-10-CM

## 2020-03-24 DIAGNOSIS — M62838 Other muscle spasm: Secondary | ICD-10-CM

## 2020-03-24 NOTE — Patient Instructions (Addendum)
Available in 32 oz, 1/2 gallon, or 1 gallon sizes, all colors...... To help you increase your water intake, try getting a reusable water bottle with markings to help you keep up with your hydration throughout the day. You should be drinking ~ 50% of your bodyweight in Oz. Of water each day or an average of ~ 64 Oz.  You can also add fruit or cucumbers to your water to make it more interesting and desirable. Cucumbers and honeydew are good choices because they are not acidic like citrus fruit and they add a refreshing flavor.    Try switching to just caffeine free Mtn. Dew for ~ 3 days and keep track of whether you notice decreased urgency for either bowels or bladder.    Your Vagina is Not Cussing! One of the most fascinating things I've learned as a pelvic floor physical therapist is that women really have a variety of ways that they wash their crotch. Should that be "crotches"? Can you make that plural? If not, why not? Tell me that.. But, cleaning the crotch - it's important. We clean our face, our armpits and our feet. The crotch has got a lot going on so it should be cleaned too, right? Women clean themselves differently, but that's not necessarily okay. There are some basic facts that are important to know when it comes to keeping your machine well-oiled and running, regardless of whether she's a [place] or a 2015 [employer]; cuz either way she's a beauty, right? So what is the right way for a woman to clean her vulvovaginal area in order to ensure cleanliness, odor reduction and avoidance of infection? Let's start with what I hear from patients: 1. "I usually douche because that's what my mother did." 2. "I use a lavender scented soap all over my body and I get a wash rag and scrub my vulva." 3. "I spread my labias and get soap on them and then I put soap inside my vagina. I'm very clean." 4. "I'm careful, so I go front to back with the soap. I start at the vulva and soap it  real good, then I reach over to my anus and get that soapy." 5. "I use a loofa on my vulva and then after I shower I spray a little perfume down there. You never know what's going to happen that day." Friends, Romans, Countrywomen - lend me your ear! All these people are WRONG! (and that's probably one reason why they're seeing me in the first place) If you want my advice, I'm going to be succinct, clear and direct. You can wash your vulvovaginal area any way you'd like as long as you are in the shower, eliminate all soap and let warm water run over the area and only use your hands. Just call me the Lorene Dy of the vagina.or is that weird?  Here's what I want you to do: 1. Wash your hair. 2. Wash your body with soap. 3. Rinse everything off. 4. Let warm water rinse over your labias. Yes, you can spread your labias. 5. Let warm water rinse over your anus. 6. Get out of the shower.** 7. Gently and lovingly pat the vulva dry and put on white, cotton underwear. **You can wash your hands before getting out. So why am I so restricting? Here's why: 1. The vagina is self-cleansing. There is no need to douche or soap inside the vagina. It's already got a good bacteria called lactobacilli that has several  important functions. Lactobacilli eats up bad bacteria that can cause infection, it keeps the vagina acidic in order to reduce the likelihood of infections and it's even postulated that lactobacilli can prompt the immune system. This helps reduce odor, infection and keeps the natural flora healthy. Oh, and get this - estrogen helps to feed lactobacilli. So if you're low on estrogen, it makes sense that you might be prone to more infections. Please, just don't use soap on the vulva or in the vagina. Trust me, your vagina is not cussing. (Ironically enough, the inside of the mouth is made up of the same durable tissue as the inside of the vagina.) 2. The vulva wasn't meant to be scrubbed - it's not a potato.  The vulva is sensitive like your fingertips, the skin around your eyes and your lips. It's meant to detect fine detail (for pleasure), so being forceful with it is going to make it more sensitive in a negative way - hypersensitive (for pain). Scrubbing can remove a fine layer of the vulvovaginal tissue which can create an anti-histamine response - much like scrubbing your arm would make your arm red. This creates an inflammatory cascade of events. Many people will heal from this quite quickly and may not notice any discomfort, but others may start to notice some irritation after some time. This is when you might start noticing sensitivity to things that never bothered you before like tight clothes, colorful underwear, lubricants or laundry detergent. 3. Scented items (or items with chemicals) like perfume (on the vulva), soap, bubble bath or even flavored or hot/cold/tingly/prickly/naughty sexual lubricant/condoms should be avoided as well because they could irritate the opening of the vagina (the vulvar vestibule) or the vagina itself. The vulvar vestibule is made of up different tissue than the vagina (but the same tissue as the urethra and bladder), so it's possible that using chemical products here can cause pain and the symptoms of a urinary tract infection (UTI). 4. The vagina needs to breathe. Wearing tight, conforming clothing all the time or daily pantyliners can be suffocating to your vulvovaginal area and irritating to the skin. Give it a break sometimes and wear looser clothing and or no underwear at all (like at night). 5. If you have a sensitive vulva or are prone to a lot of symptoms of infections, consider wearing white, cotton underwear instead of the fancy stuff. Over time, it's possible to develop new allergies and unfortunately, some women develop allergies to synthetic materials and dyes in their underwear. This also means it's best to wash your underwear with a detergent that is made for  sensitive skin and is free of chemicals. ** Note - we will expand this area in the near future (with Sara's blessings) to include other options for under wear or safe liners. Stay tuned!  And get this: Discharge doesn't mean you are dirty. Discharge is natural and comes from a variety of places, most of which are not the vagina itself. What you see on your underwear is a mix of oil and gland secretions from the vulva and it's also secretions from the uterus and the fallopian tubes. Discharge changes during different parts of the menstrual cycle because it serves different purposes. For example, when you are ovulating, the discharge is a different consistency so that sperm can pass through it more easily. It's all normal and healthy. However, if it starts to change colors or smell really funky - this indicates a possible issue with an area that  is potentially apart from the vagina. Soaping and scrubbing to high Gillie Manners is not going to fix this - you really need to get checked out by a doctor in this situation. Taking care of the vulvovaginal tissue is easier than we want it to be. Less is more. So much more. Good, simple vulvovaginal hygiene means better flora (not fauna), reduced odor, less itching and less discomfort. So cheers to you and your polite vagina. That little number was raised right! -Bing [place]ighbors. Sauder PT, DPT

## 2020-03-24 NOTE — Therapy (Signed)
[place] MAIN [facility] SERVICES [address] [place], [place], [address] Phone: [phone]   Fax:  [phone]  Physical Therapy Treatment  The patient has been informed of current processes in place at Outpatient Rehab to protect patients from Covid-19 exposure including social distancing, schedule modifications, and new cleaning procedures. After discussing their particular risk with a therapist based on the patient's personal risk factors, the patient has decided to proceed with in-person therapy.  Patient Details  Name: Tracy Espinoza MRN: [id number] Date of Birth: 12/21/1945 No data recorded  Encounter Date: 03/24/2020   PT End of Session - 03/24/20 1904    Visit Number 11    Number of Visits 20    Date for PT Re-Evaluation 04/14/20    Authorization Type Medicare    Authorization Time Period 5/5 through 7/14    Authorization - Visit Number 11    Authorization - Number of Visits 20    Progress Note Due on Visit 20    PT Start Time 1330    PT Stop Time 1430    PT Time Calculation (min) 60 min    Activity Tolerance Patient tolerated treatment well;No increased pain    Behavior During Therapy WFL for tasks assessed/performed           Past Medical History:  Diagnosis Date  . Anxiety   . Arthritis   . Cancer ([place])    BASAL CELL SKIN CANCERS REMOVED  . Depression   . Edema    FEET/LEGS  . GERD (gastroesophageal reflux disease)   . Heart murmur   . History of hiatal hernia   . Hypertension   . Lymphedema   . MRSA (methicillin resistant staph aureus) culture positive    H/O    Past Surgical History:  Procedure Laterality Date  . ABDOMINAL HYSTERECTOMY    . APPENDECTOMY    . BACK SURGERY    . CATARACT EXTRACTION W/PHACO Left 03/28/2018   Procedure: CATARACT EXTRACTION PHACO AND INTRAOCULAR LENS PLACEMENT (IOC);  Surgeon: Eulogio Bear, MD;  Location: [facility] ORS;  Service: Ophthalmology;  Laterality: Left;  [place] 00:28.2 AP%  7.9 CDE 2.21 Fluid pack lot # [id number]  . CATARACT EXTRACTION W/PHACO Right 05/16/2018   Procedure: CATARACT EXTRACTION PHACO AND INTRAOCULAR LENS PLACEMENT (IOC);  Surgeon: Eulogio Bear, MD;  Location: [facility] ORS;  Service: Ophthalmology;  Laterality: Right;  [place] 00:26.2 AP% 7.6 CDE 2.01 Fluid pack lot # [id number]  . CHOLECYSTECTOMY    . COLONOSCOPY    . FRACTURE SURGERY     BONE ON SIDE OF RIGHT FOOT  . HERNIA REPAIR     UMBILICAL X 2  . JOINT REPLACEMENT Bilateral    knees  . KIDNEY SURGERY    . LUMBAR WOUND DEBRIDEMENT N/A 08/03/2016   Procedure: IRRIGATION AND DEBRIDEMENT LUMBAR WOUND;  Surgeon: Eustace Moore, MD;  Location: [place];  Service: Neurosurgery;  Laterality: N/A;  . OVARIAN CYST SURGERY    . REPAIR OF LEFT URETER Left 40 YRS AGO  . ROTATOR CUFF REPAIR  2014  . TONSILLECTOMY    . TUBAL LIGATION      There were no vitals filed for this visit.  Pelvic Floor Physical Therapy Treatment Note  SCREENING  Changes in medications, allergies, or medical history?: none    SUBJECTIVE  Patient reports: She had a bad day on Monday, ate some bad [place] and had FI multiple times, still felt bad on Tuesday, has not been able  to much of her exercises since last visit. Has been having shoulder/neck pain today that is not easing off with her shoulder squeezes like it often does.  Precautions:  Abdominal hysterectomy, Lymphedema (diagnosed ~ 3 years ago) (needs to be wearing pressure boots) L4-5 fusion in 2017.  Location of pain: low back/R hip (neck/shoulder) Current pain: 0/10  (5/10) Max pain: 4/10 (5/10) Least pain: 0/10 (0/10) Nature of pain:"grinding" sore/achy  **0/10 neck/shoulder pain following treatment.  Patient Goals: Would like to get a handle on her diarrhea so it is not so liquid and she can control it better. Would like to be able to go out to shop or go to church without fear of FI. Not have to change clothes more than 1 time per day due to leakage.  Stool does seem to be more solid than it was, Thinks that the mucinex is making the difference. Urine incontinence has been terrible the last two days. When she wakes up from sleep/nap she has as a large accident. The worst is when she gets up from doing her "pressure boots".    OBJECTIVE  Changes in: Posture/Observations:  Hyperlordosis/kyphosis, FHP, prominent C7, and R thoracic scoliosis.   Range of Motion/Flexibilty:  Decreased mobility and pain with pressure at C/T junction   Strength/MMT:  4+ for knee EXT and flex, 4/5 for hip flex, DF, hip EXT (from prior visit)  Pelvic floor: Pt. States she can feel PFM contracting when she performs slight posterior pelvic tilt first. (from prior visit)  Abdominal:  10 finger diastasis, narrows to 8 with exhale/posterior pelvic tilt. Able to consistently do pelvic tilt with min to no cueing (from prior session)  Palpation: TTP to  B upper traps, Sub-occipitals, L>R SCM and R middle scalenes.   Gait Analysis: Pt. Is ~50% improvement without Cueing.  INTERVENTIONS THIS SESSION:  Self-care: educated on vulvar hygiene and trying a different probiotic to minimize irritation and decrease bad-bacteria growth. Discussed using a bidet rather than wipes that may have dyes or chemicals on them. Discussed trying to minimize caffeine to see if there is decreased urgency and switching crystal light out for water with cucumber to decrease artificial sweeteners if possible to minimize irritants. Educated on not using too many pillows at night to help decrease FHP.  Manual: Performed TP release to  B upper traps, Sub-occipitals, L>R SCM and R middle scalenes followed by grade 2-3 PA mobs to C7 and traction to cervical spine to decrease spasm and pain and allow for improved balance of musculature for improved function and decreased symptoms.  Therex: Educated on and practiced supine chin-tucks (no handout given) to improve efficacy of HEP and decrease  FHP.  Total time: 60                               PT Short Term Goals - 03/24/20 1331      PT SHORT TERM GOAL #1   Title Patient will demonstrate functional recruitment of TA with breathing, sit-to-stand, squatting/lifting, and walking to allow for improved pelvic brace coordination, improved balance, and decreased downward pressure on the pelvic organs.    Baseline Pt. demonstrates dysfunctional breathing and poor TA recruitment/awareness.    Time 5    Period Weeks    Status Achieved    Target Date 03/10/20      PT SHORT TERM GOAL #2   Title Patient will demonstrate improved pelvic alignment and balance of musculature  surrounding the pelvis to facilitate decreased PFM spasms and decrease pelvic pain.    Baseline R anterior/L posterior pelvic obliquity and R thoracic scoliotic curve. As of 6/23: is ~ 50% better. and is feeling like she has better balance.    Time 10    Period Weeks    Status On-going    Target Date 04/14/20      PT SHORT TERM GOAL #3   Title Patient will demonstrate HEP x1 in the clinic to demonstrate understanding and proper form to allow for further improvement.    Baseline Pt. lacks knowledge of which therapeutic exercises can help decrease her pain/Sx.    Time 5    Period Weeks    Status Achieved    Target Date 03/10/20      PT SHORT TERM GOAL #4   Title Pt. will demonstrate implementation of behavioral modifications such as fluid intake and use of Urge suppression technique to allow for decreased UUI/frequency.    Baseline Pt. having UUI daily, drinking a lot of Mtn. Dew    Time 5    Period Weeks    Status New    Target Date 03/10/20             PT Long Term Goals - 03/24/20 1441      PT LONG TERM GOAL #1   Title Patient will report no episodes of Fecal Incontinence over the past two weeks to demonstrate improved function and quality of life and to allow her to participate in going to church and shopping as part of her  occupation.    Baseline Pt. having to change clothes 3 times per day and does not eat if leaving the house due to [place]. As of 6/23: Pt. only changing 1-2 times per day but still does not feel like she can eat if she has to leave the house.    Time 20    Period Weeks    Status On-going    Target Date 06/23/20      PT LONG TERM GOAL #2   Title Patient will describe pain no greater than 3/10 during standing or walking up to 30 min. to demonstrate improved functional ability.    Baseline pain increases to 6/10 (enough to warrant resting) after 15 min. of standing or walking. As of 6/23: pain 4/10 at greatest with ADL's but has not walked for exercise yet.    Time 10    Period Weeks    Status On-going    Target Date 04/14/20      PT LONG TERM GOAL #3   Title Pt. will improve in FOTO score by 15 points in each category to demonstrate improved function.    Baseline FOTO PFDI Prolapse: 54 PFDI Bowel: 63, Urinary Problem: 44, Bowel Leakage 46, PFDI Urinary: 54, PFDI Pain 46.    Time 20    Period Weeks    Status On-going    Target Date 06/23/20                 Plan - 03/24/20 1905    Clinical Impression Statement Pt. Responded well to all interventions today, demonstrating decreased pain from 5/10 to 0/10, decreased spasm, improved chin-tuck performance, as well as understanding and correct performance of all education and exercises provided today. They will continue to benefit from skilled physical therapy to work toward remaining goals and maximize function as well as decrease likelihood of symptom increase or recurrence.     PT Next Visit Plan  further thoracic mobility, review hip-flexor stretch, side-stretch, LB stretch, standing pelvic tilts, weight shifts and add kegels to urge-suppression if appropriate. Assess internally when appropriate    PT Home Exercise Plan soda-can theory, belly breathing, urge suppression (breathing only), sit-to-stand with exhale, supine-to-sit, pelvic tilt  with PFM and TA, hook-lying pelvic tilts, R hip EXT/heel press, heel-lift for LLE, hip IR with yellow band, scapular retraction, chin-tucks. weight-shift balances, supine chin-tucks, hip-flexor stretch, low back stretch, side-stretch    Consulted and Agree with Plan of Care Patient           Patient will benefit from skilled therapeutic intervention in order to improve the following deficits and impairments:     Visit Diagnosis: Other idiopathic scoliosis, thoracolumbar region  Other muscle spasm  Other lack of coordination  Sacrococcygeal disorders, not elsewhere classified     Problem List Patient Active Problem List   Diagnosis Date Noted  . Restless legs 10/24/2018  . 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 GERD (gastroesophageal reflux disease) 03/20/2018  . Lymphedema 12/25/2017  . Insomnia 12/18/2017  . Lung mass 03/13/2017  . Coin lesion 03/13/2017  . Advanced care planning/counseling discussion 03/13/2017  . Back pain at L4-L5 level 01/01/2017  . Multiple pulmonary nodules 01/01/2017  . Incidental pulmonary nodule, greater than or equal to 88mm 01/01/2017  . Bilateral leg edema 09/19/2016  . Normocytic normochromic anemia 08/03/2016  . Spondylolisthesis at L4-L5 level 07/10/2016  . Spinal stenosis of lumbar region with radiculopathy 04/27/2016  . Anxiety 04/27/2016  . Trochanteric bursitis of right hip 10/08/2015  . Hypertension 03/29/2015  . Hypercholesteremia 03/29/2015  . BMI 45.0-49.9, adult ([place]) 03/29/2015  . Arthralgia of hands, bilateral 03/29/2015   Willa Rough DPT, ATC Willa Rough 03/24/2020, 7:06 PM  [place] MAIN [facility] SERVICES [address] [place], [place], [address] Phone: [phone]   Fax:  [phone]  Name: BALEY SHANDS MRN: [id number] Date of Birth: 13-Oct-1945

## 2020-03-25 ENCOUNTER — Telehealth: Payer: Self-pay | Admitting: Family Medicine

## 2020-03-25 NOTE — Telephone Encounter (Signed)
Copied from [place] [id number]. Topic: Medicare AWV >> Mar 25, 2020  2:02 PM Cher Nakai R wrote: Reason for CRM: Left message for patient to call back and schedule the Medicare Annual Wellness Visit (AWV) virtually.  Last AWV 03/26/2019  Please schedule at anytime with [facility]Nurse Health Advisor.  45 minute appointment

## 2020-03-29 ENCOUNTER — Other Ambulatory Visit: Payer: Self-pay

## 2020-03-29 ENCOUNTER — Ambulatory Visit: Payer: Medicare Other

## 2020-03-29 DIAGNOSIS — M4125 Other idiopathic scoliosis, thoracolumbar region: Secondary | ICD-10-CM

## 2020-03-29 DIAGNOSIS — M533 Sacrococcygeal disorders, not elsewhere classified: Secondary | ICD-10-CM

## 2020-03-29 DIAGNOSIS — R278 Other lack of coordination: Secondary | ICD-10-CM

## 2020-03-29 DIAGNOSIS — M62838 Other muscle spasm: Secondary | ICD-10-CM

## 2020-03-29 NOTE — Therapy (Signed)
[place] MAIN [facility] SERVICES [address] [place], [place], [address] Phone: [phone]   Fax:  [phone]  Physical Therapy Treatment  The patient has been informed of current processes in place at Outpatient Rehab to protect patients from Covid-19 exposure including social distancing, schedule modifications, and new cleaning procedures. After discussing their particular risk with a therapist based on the patient's personal risk factors, the patient has decided to proceed with in-person therapy.   Patient Details  Name: Tracy Espinoza MRN: [id number] Date of Birth: 1946-03-28 No data recorded  Encounter Date: 03/29/2020   PT End of Session - 03/29/20 1600    Visit Number 12    Number of Visits 20    Date for PT Re-Evaluation 04/14/20    Authorization Type Medicare    Authorization Time Period 5/5 through 7/14    Authorization - Visit Number 12    Authorization - Number of Visits 20    Progress Note Due on Visit 20    PT Start Time 1500    PT Stop Time 1600    PT Time Calculation (min) 60 min    Activity Tolerance Patient tolerated treatment well;No increased pain    Behavior During Therapy WFL for tasks assessed/performed           Past Medical History:  Diagnosis Date  . Anxiety   . Arthritis   . Cancer ([place])    BASAL CELL SKIN CANCERS REMOVED  . Depression   . Edema    FEET/LEGS  . GERD (gastroesophageal reflux disease)   . Heart murmur   . History of hiatal hernia   . Hypertension   . Lymphedema   . MRSA (methicillin resistant staph aureus) culture positive    H/O    Past Surgical History:  Procedure Laterality Date  . ABDOMINAL HYSTERECTOMY    . APPENDECTOMY    . BACK SURGERY    . CATARACT EXTRACTION W/PHACO Left 03/28/2018   Procedure: CATARACT EXTRACTION PHACO AND INTRAOCULAR LENS PLACEMENT (IOC);  Surgeon: Eulogio Bear, MD;  Location: [facility] ORS;  Service: Ophthalmology;  Laterality: Left;  [place] 00:28.2 AP%  7.9 CDE 2.21 Fluid pack lot # [id number]  . CATARACT EXTRACTION W/PHACO Right 05/16/2018   Procedure: CATARACT EXTRACTION PHACO AND INTRAOCULAR LENS PLACEMENT (IOC);  Surgeon: Eulogio Bear, MD;  Location: [facility] ORS;  Service: Ophthalmology;  Laterality: Right;  [place] 00:26.2 AP% 7.6 CDE 2.01 Fluid pack lot # [id number]  . CHOLECYSTECTOMY    . COLONOSCOPY    . FRACTURE SURGERY     BONE ON SIDE OF RIGHT FOOT  . HERNIA REPAIR     UMBILICAL X 2  . JOINT REPLACEMENT Bilateral    knees  . KIDNEY SURGERY    . LUMBAR WOUND DEBRIDEMENT N/A 08/03/2016   Procedure: IRRIGATION AND DEBRIDEMENT LUMBAR WOUND;  Surgeon: Eustace Moore, MD;  Location: [place];  Service: Neurosurgery;  Laterality: N/A;  . OVARIAN CYST SURGERY    . REPAIR OF LEFT URETER Left 40 YRS AGO  . ROTATOR CUFF REPAIR  2014  . TONSILLECTOMY    . TUBAL LIGATION      There were no vitals filed for this visit.   Pelvic Floor Physical Therapy Treatment Note  SCREENING  Changes in medications, allergies, or medical history?: none    SUBJECTIVE  Patient reports: She has had to change  1 time already today. Is feeling very tired. Still walking the same distance in the house with  her hands full but feeling more confident doing it than last week. Has been really trying to work on increasing her balance.   Precautions:  Abdominal hysterectomy, Lymphedema (diagnosed ~ 3 years ago) (needs to be wearing pressure boots) L4-5 fusion in 2017.  Location of pain: L LB/ hip (neck/shoulder) Current pain: 4/10  (0/10) Max pain: 4/10 (0/10) Least pain: 0/10 (0/10) Nature of pain:"grinding" sore/achy  **0/10 following treatment   Patient Goals: Would like to get a handle on her diarrhea so it is not so liquid and she can control it better. Would like to be able to go out to shop or go to church without fear of FI. Not have to change clothes more than 1 time per day due to leakage. Stool does seem to be more solid than it was, Thinks  that the mucinex is making the difference. Urine incontinence has been terrible the last two days. When she wakes up from sleep/nap she has as a large accident. The worst is when she gets up from doing her "pressure boots".    OBJECTIVE  Changes in: Posture/Observations:  Hyperlordosis/kyphosis, FHP, prominent C7, and R thoracic scoliosis. L PSIS high in standing  **level PSIS following removal of heel-lift.  Range of Motion/Flexibilty:    Strength/MMT:  4+ for knee EXT and flex, 4/5 for hip flex, DF, hip EXT (from prior visit)  Pelvic floor: Pt. States she can feel PFM contracting when she performs slight posterior pelvic tilt first. (from prior visit)  Abdominal:  10 finger diastasis, narrows to 8 with exhale/posterior pelvic tilt. Able to consistently do pelvic tilt with min to no cueing (from prior session)  Palpation: TTP to  L QL  Gait Analysis: Pt. Is ~50% improvement without Cueing. (from prior visit)  INTERVENTIONS THIS SESSION:  Manual: Performed TP release to L QL in a seated/forward leaning position to decrease spasm and allow for improved pelvic alignment and better stability in standing.  Therex: Educated on and practiced seated scapular retraction with yellow band performed 3x10. Reviewed and practiced seated pelvic tiltx 2x10 and standing pelvic tilts 2x10 as well as standing weight-shifts 1x10. Performed side-stretch x2 bilaterally for 5 breaths to decrease upward pull on L ilium to improve pelvic alignment.  Theract: re-assessed pelvic alignment and decided to remove heel-lift due to Pt. Having decreased stability when she is wearing her heel-lift and the fact that she is not going to wear her shoes most of the time while at home.  Gait training: Reviewed walking with tall posture, gave MIN cueing for shoulders back and down, challenged AMB with multi-task of talking while walking.  Total time: 60                             PT  Short Term Goals - 03/24/20 1331      PT SHORT TERM GOAL #1   Title Patient will demonstrate functional recruitment of TA with breathing, sit-to-stand, squatting/lifting, and walking to allow for improved pelvic brace coordination, improved balance, and decreased downward pressure on the pelvic organs.    Baseline Pt. demonstrates dysfunctional breathing and poor TA recruitment/awareness.    Time 5    Period Weeks    Status Achieved    Target Date 03/10/20      PT SHORT TERM GOAL #2   Title Patient will demonstrate improved pelvic alignment and balance of musculature surrounding the pelvis to facilitate decreased PFM spasms and decrease pelvic pain.  Baseline R anterior/L posterior pelvic obliquity and R thoracic scoliotic curve. As of 6/23: is ~ 50% better. and is feeling like she has better balance.    Time 10    Period Weeks    Status On-going    Target Date 04/14/20      PT SHORT TERM GOAL #3   Title Patient will demonstrate HEP x1 in the clinic to demonstrate understanding and proper form to allow for further improvement.    Baseline Pt. lacks knowledge of which therapeutic exercises can help decrease her pain/Sx.    Time 5    Period Weeks    Status Achieved    Target Date 03/10/20      PT SHORT TERM GOAL #4   Title Pt. will demonstrate implementation of behavioral modifications such as fluid intake and use of Urge suppression technique to allow for decreased UUI/frequency.    Baseline Pt. having UUI daily, drinking a lot of Mtn. Dew    Time 5    Period Weeks    Status New    Target Date 03/10/20             PT Long Term Goals - 03/24/20 1441      PT LONG TERM GOAL #1   Title Patient will report no episodes of Fecal Incontinence over the past two weeks to demonstrate improved function and quality of life and to allow her to participate in going to church and shopping as part of her occupation.    Baseline Pt. having to change clothes 3 times per day and does not eat  if leaving the house due to [place]. As of 6/23: Pt. only changing 1-2 times per day but still does not feel like she can eat if she has to leave the house.    Time 20    Period Weeks    Status On-going    Target Date 06/23/20      PT LONG TERM GOAL #2   Title Patient will describe pain no greater than 3/10 during standing or walking up to 30 min. to demonstrate improved functional ability.    Baseline pain increases to 6/10 (enough to warrant resting) after 15 min. of standing or walking. As of 6/23: pain 4/10 at greatest with ADL's but has not walked for exercise yet.    Time 10    Period Weeks    Status On-going    Target Date 04/14/20      PT LONG TERM GOAL #3   Title Pt. will improve in FOTO score by 15 points in each category to demonstrate improved function.    Baseline FOTO PFDI Prolapse: 54 PFDI Bowel: 63, Urinary Problem: 44, Bowel Leakage 46, PFDI Urinary: 54, PFDI Pain 46.    Time 20    Period Weeks    Status On-going    Target Date 06/23/20                 Plan - 03/29/20 1606    Clinical Impression Statement Pt. Responded well to all interventions today, demonstrating improved stability in standing and pelvic alignment, decreased pain, as well as understanding and correct performance of all education and exercises provided today. They will continue to benefit from skilled physical therapy to work toward remaining goals and maximize function as well as decrease likelihood of symptom increase or recurrence.    PT Next Visit Plan further thoracic mobility, review hip-flexor stretch, side-stretch, LB stretch, standing pelvic tilts, weight shifts and add kegels to  urge-suppression if appropriate. Assess internally when appropriate    PT Home Exercise Plan soda-can theory, belly breathing, urge suppression (breathing only), sit-to-stand with exhale, supine-to-sit, pelvic tilt with PFM and TA, hook-lying pelvic tilts, R hip EXT/heel press, heel-lift for LLE, hip IR with yellow  band, scapular retraction, chin-tucks. weight-shift balances, supine chin-tucks, hip-flexor stretch, low back stretch, side-stretch    Consulted and Agree with Plan of Care Patient           Patient will benefit from skilled therapeutic intervention in order to improve the following deficits and impairments:     Visit Diagnosis: Other idiopathic scoliosis, thoracolumbar region  Other muscle spasm  Other lack of coordination  Sacrococcygeal disorders, not elsewhere classified     Problem List Patient Active Problem List   Diagnosis Date Noted  . Restless legs 10/24/2018  . 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 GERD (gastroesophageal reflux disease) 03/20/2018  . Lymphedema 12/25/2017  . Insomnia 12/18/2017  . Lung mass 03/13/2017  . Coin lesion 03/13/2017  . Advanced care planning/counseling discussion 03/13/2017  . Back pain at L4-L5 level 01/01/2017  . Multiple pulmonary nodules 01/01/2017  . Incidental pulmonary nodule, greater than or equal to 84mm 01/01/2017  . Bilateral leg edema 09/19/2016  . Normocytic normochromic anemia 08/03/2016  . Spondylolisthesis at L4-L5 level 07/10/2016  . Spinal stenosis of lumbar region with radiculopathy 04/27/2016  . Anxiety 04/27/2016  . Trochanteric bursitis of right hip 10/08/2015  . Hypertension 03/29/2015  . Hypercholesteremia 03/29/2015  . BMI 45.0-49.9, adult ([place]) 03/29/2015  . Arthralgia of hands, bilateral 03/29/2015   Willa Rough DPT, ATC Willa Rough 03/29/2020, 5:14 PM  [place] MAIN [facility] SERVICES [address] [place], [place], [address] Phone: [phone]   Fax:  [phone]  Name: EMEREE MAHLER MRN: [id number] Date of Birth: 11/21/1945

## 2020-03-31 ENCOUNTER — Other Ambulatory Visit: Payer: Self-pay

## 2020-03-31 ENCOUNTER — Ambulatory Visit: Payer: Medicare Other

## 2020-03-31 DIAGNOSIS — M4125 Other idiopathic scoliosis, thoracolumbar region: Secondary | ICD-10-CM | POA: Diagnosis not present

## 2020-03-31 DIAGNOSIS — R278 Other lack of coordination: Secondary | ICD-10-CM

## 2020-03-31 DIAGNOSIS — M533 Sacrococcygeal disorders, not elsewhere classified: Secondary | ICD-10-CM

## 2020-03-31 DIAGNOSIS — M62838 Other muscle spasm: Secondary | ICD-10-CM

## 2020-03-31 NOTE — Therapy (Signed)
[place] MAIN [facility] SERVICES [address] [place], [place], [address] Phone: [phone]   Fax:  [phone]  Physical Therapy Treatment  The patient has been informed of current processes in place at Outpatient Rehab to protect patients from Covid-19 exposure including social distancing, schedule modifications, and new cleaning procedures. After discussing their particular risk with a therapist based on the patient's personal risk factors, the patient has decided to proceed with in-person therapy.  Patient Details  Name: Tracy Espinoza MRN: [id number] Date of Birth: 1945-12-08 No data recorded  Encounter Date: 03/31/2020   PT End of Session - 03/31/20 1843    Visit Number 13    Number of Visits 20    Date for PT Re-Evaluation 04/14/20    Authorization Type Medicare    Authorization Time Period 5/5 through 7/14    Authorization - Visit Number 13    Authorization - Number of Visits 20    Progress Note Due on Visit 20    PT Start Time 1330    PT Stop Time 1430    PT Time Calculation (min) 60 min    Activity Tolerance Patient tolerated treatment well;No increased pain    Behavior During Therapy WFL for tasks assessed/performed           Past Medical History:  Diagnosis Date  . Anxiety   . Arthritis   . Cancer ([place])    BASAL CELL SKIN CANCERS REMOVED  . Depression   . Edema    FEET/LEGS  . GERD (gastroesophageal reflux disease)   . Heart murmur   . History of hiatal hernia   . Hypertension   . Lymphedema   . MRSA (methicillin resistant staph aureus) culture positive    H/O    Past Surgical History:  Procedure Laterality Date  . ABDOMINAL HYSTERECTOMY    . APPENDECTOMY    . BACK SURGERY    . CATARACT EXTRACTION W/PHACO Left 03/28/2018   Procedure: CATARACT EXTRACTION PHACO AND INTRAOCULAR LENS PLACEMENT (IOC);  Surgeon: Eulogio Bear, MD;  Location: [facility] ORS;  Service: Ophthalmology;  Laterality: Left;  [place] 00:28.2 AP%  7.9 CDE 2.21 Fluid pack lot # [id number]  . CATARACT EXTRACTION W/PHACO Right 05/16/2018   Procedure: CATARACT EXTRACTION PHACO AND INTRAOCULAR LENS PLACEMENT (IOC);  Surgeon: Eulogio Bear, MD;  Location: [facility] ORS;  Service: Ophthalmology;  Laterality: Right;  [place] 00:26.2 AP% 7.6 CDE 2.01 Fluid pack lot # [id number]  . CHOLECYSTECTOMY    . COLONOSCOPY    . FRACTURE SURGERY     BONE ON SIDE OF RIGHT FOOT  . HERNIA REPAIR     UMBILICAL X 2  . JOINT REPLACEMENT Bilateral    knees  . KIDNEY SURGERY    . LUMBAR WOUND DEBRIDEMENT N/A 08/03/2016   Procedure: IRRIGATION AND DEBRIDEMENT LUMBAR WOUND;  Surgeon: Eustace Moore, MD;  Location: [place];  Service: Neurosurgery;  Laterality: N/A;  . OVARIAN CYST SURGERY    . REPAIR OF LEFT URETER Left 40 YRS AGO  . ROTATOR CUFF REPAIR  2014  . TONSILLECTOMY    . TUBAL LIGATION      There were no vitals filed for this visit.  Pelvic Floor Physical Therapy Treatment Note  SCREENING  Changes in medications, allergies, or medical history?: none    SUBJECTIVE  Patient reports: She has had to change  1 time already today. Is feeling very tired. Still walking the same distance in the house with her hands  full but feeling more confident doing it than last week. Has been really trying to work on increasing her balance.   Precautions:  Abdominal hysterectomy, Lymphedema (diagnosed ~ 3 years ago) (needs to be wearing pressure boots) L4-5 fusion in 2017.  Location of pain: R>L hip (neck/shoulder) Current pain: 4/10  (0/10) Max pain: 4/10 (0/10) Least pain: 0/10 (0/10) Nature of pain:"grinding" sore/achy  **0/10 following treatment   Patient Goals: Having some soreness today. Has noticed that if she passes gas when she stands up she will have diarrhea and needs to head to the bathroom regardless of if she has an urge to have a BM. If she goes right away "sometimes" she can make it. She had a "blow-out today.    OBJECTIVE  Changes  in: Posture/Observations:  Hyperlordosis/kyphosis, FHP, prominent C7, and R thoracic scoliosis. (from prior session)   Range of Motion/Flexibilty:    Strength/MMT:  4+ for knee EXT and flex, 4/5 for hip flex, DF, hip EXT (from prior visit)  Pelvic floor: (palpated over clothes with pads in place) No TTP externally, able to feel small contraction with cue to squeeze.  Abdominal:  10 finger diastasis, narrows to 8 with exhale/posterior pelvic tilt. Able to consistently do pelvic tilt with min to no cueing (from prior session)  Palpation: TTP to B iliacus and diaphragm  Gait Analysis: Pt. Is ~50% improvement without Cueing. (from prior visit)  INTERVENTIONS THIS SESSION:  Manual: Palpated the external PFM over clothes, Performed TP release and STM to B Iliacus and diaphragm to decrease spasm and pain and allow for improved balance of musculature for improved function and decreased symptoms.  Dry-needle: Performed TPDN with a .30x159mm needle and standard approach as described below to decrease spasm and pain and allow for improved balance of musculature for improved function and decreased symptoms.   Therex: Reviewed hook-lying posterior pelvic tilts with max VC, TC to improve TA recruitment and decrease diastasis recti to allow for improved coordination and control of the deep-core to decrease FI and UI.  Total time: 60                      Trigger Point Dry Needling - 03/31/20 0001    Consent Given? Yes    Education Handout Provided No    Muscles Treated Back/Hip Iliacus    Dry Needling Comments bilateral    Iliacus Response Twitch response elicited;Palpable increased muscle length                  PT Short Term Goals - 03/24/20 1331      PT SHORT TERM GOAL #1   Title Patient will demonstrate functional recruitment of TA with breathing, sit-to-stand, squatting/lifting, and walking to allow for improved pelvic brace coordination, improved  balance, and decreased downward pressure on the pelvic organs.    Baseline Pt. demonstrates dysfunctional breathing and poor TA recruitment/awareness.    Time 5    Period Weeks    Status Achieved    Target Date 03/10/20      PT SHORT TERM GOAL #2   Title Patient will demonstrate improved pelvic alignment and balance of musculature surrounding the pelvis to facilitate decreased PFM spasms and decrease pelvic pain.    Baseline R anterior/L posterior pelvic obliquity and R thoracic scoliotic curve. As of 6/23: is ~ 50% better. and is feeling like she has better balance.    Time 10    Period Weeks    Status On-going  Target Date 04/14/20      PT SHORT TERM GOAL #3   Title Patient will demonstrate HEP x1 in the clinic to demonstrate understanding and proper form to allow for further improvement.    Baseline Pt. lacks knowledge of which therapeutic exercises can help decrease her pain/Sx.    Time 5    Period Weeks    Status Achieved    Target Date 03/10/20      PT SHORT TERM GOAL #4   Title Pt. will demonstrate implementation of behavioral modifications such as fluid intake and use of Urge suppression technique to allow for decreased UUI/frequency.    Baseline Pt. having UUI daily, drinking a lot of Mtn. Dew    Time 5    Period Weeks    Status New    Target Date 03/10/20             PT Long Term Goals - 03/24/20 1441      PT LONG TERM GOAL #1   Title Patient will report no episodes of Fecal Incontinence over the past two weeks to demonstrate improved function and quality of life and to allow her to participate in going to church and shopping as part of her occupation.    Baseline Pt. having to change clothes 3 times per day and does not eat if leaving the house due to [place]. As of 6/23: Pt. only changing 1-2 times per day but still does not feel like she can eat if she has to leave the house.    Time 20    Period Weeks    Status On-going    Target Date 06/23/20      PT LONG  TERM GOAL #2   Title Patient will describe pain no greater than 3/10 during standing or walking up to 30 min. to demonstrate improved functional ability.    Baseline pain increases to 6/10 (enough to warrant resting) after 15 min. of standing or walking. As of 6/23: pain 4/10 at greatest with ADL's but has not walked for exercise yet.    Time 10    Period Weeks    Status On-going    Target Date 04/14/20      PT LONG TERM GOAL #3   Title Pt. will improve in FOTO score by 15 points in each category to demonstrate improved function.    Baseline FOTO PFDI Prolapse: 54 PFDI Bowel: 63, Urinary Problem: 44, Bowel Leakage 46, PFDI Urinary: 54, PFDI Pain 46.    Time 20    Period Weeks    Status On-going    Target Date 06/23/20                 Plan - 03/31/20 1843    Clinical Impression Statement Pt. Responded well to all interventions today, demonstrating improved ability to recruit the TA and obliques with a posterior pelvic tilt as well as understanding and correct performance of all education and exercises provided today. She will benefit from an attempt at using biofeedback to improve PFM coordination and control as well as an abdominal binder. They will continue to benefit from skilled physical therapy to work toward remaining goals and maximize function as well as decrease likelihood of symptom increase or recurrence.    PT Next Visit Plan further thoracic mobility, review hip-flexor stretch, side-stretch, LB stretch, standing pelvic tilts, weight shifts and add kegels to urge-suppression if appropriate. Assess internally when appropriate    PT Home Exercise Plan soda-can theory, belly breathing, urge  suppression (breathing only), sit-to-stand with exhale, supine-to-sit, pelvic tilt with obliques, PFM and TA, hook-lying pelvic tilts, R hip EXT/heel press, heel-lift for LLE, hip IR with yellow band, scapular retraction, chin-tucks. weight-shift balances, supine chin-tucks, hip-flexor stretch,  low back stretch, side-stretch    Consulted and Agree with Plan of Care Patient           Patient will benefit from skilled therapeutic intervention in order to improve the following deficits and impairments:     Visit Diagnosis: Other idiopathic scoliosis, thoracolumbar region  Other muscle spasm  Other lack of coordination  Sacrococcygeal disorders, not elsewhere classified     Problem List Patient Active Problem List   Diagnosis Date Noted  . Restless legs 10/24/2018  . 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 GERD (gastroesophageal reflux disease) 03/20/2018  . Lymphedema 12/25/2017  . Insomnia 12/18/2017  . Lung mass 03/13/2017  . Coin lesion 03/13/2017  . Advanced care planning/counseling discussion 03/13/2017  . Back pain at L4-L5 level 01/01/2017  . Multiple pulmonary nodules 01/01/2017  . Incidental pulmonary nodule, greater than or equal to 92mm 01/01/2017  . Bilateral leg edema 09/19/2016  . Normocytic normochromic anemia 08/03/2016  . Spondylolisthesis at L4-L5 level 07/10/2016  . Spinal stenosis of lumbar region with radiculopathy 04/27/2016  . Anxiety 04/27/2016  . Trochanteric bursitis of right hip 10/08/2015  . Hypertension 03/29/2015  . Hypercholesteremia 03/29/2015  . BMI 45.0-49.9, adult ([place]) 03/29/2015  . Arthralgia of hands, bilateral 03/29/2015   Willa Rough DPT, ATC Willa Rough 03/31/2020, 6:45 PM  [place] MAIN [facility] SERVICES [address] [place], [place], [address] Phone: [phone]   Fax:  [phone]  Name: Tracy Espinoza MRN: [id number] Date of Birth: [birth year]

## 2020-03-31 NOTE — Patient Instructions (Signed)
Pelvic Tilt With Pelvic Floor (Hook-Lying)        Lie with hips and knees bent. Begin to exhale fist and then think about flattening the low back and finally pulling the ribs in to the center as you finish the exhale. Inhale to relax and then Repeat _2x15__ times. Do _1-2__ times a day.  Use fingers just above hip bones to feel if the low tummy muscle is tensing. Make sure the back flattens NOT arches.

## 2020-04-07 ENCOUNTER — Ambulatory Visit: Payer: Medicare Other | Attending: Gastroenterology

## 2020-04-07 ENCOUNTER — Other Ambulatory Visit: Payer: Self-pay

## 2020-04-07 DIAGNOSIS — M533 Sacrococcygeal disorders, not elsewhere classified: Secondary | ICD-10-CM | POA: Diagnosis not present

## 2020-04-07 DIAGNOSIS — M62838 Other muscle spasm: Secondary | ICD-10-CM | POA: Diagnosis not present

## 2020-04-07 DIAGNOSIS — M4125 Other idiopathic scoliosis, thoracolumbar region: Secondary | ICD-10-CM | POA: Diagnosis not present

## 2020-04-07 DIAGNOSIS — R278 Other lack of coordination: Secondary | ICD-10-CM | POA: Insufficient documentation

## 2020-04-07 NOTE — Therapy (Signed)
[place] MAIN [facility] SERVICES [address] [place], [place], [address] Phone: [phone]   Fax:  [phone]  Physical Therapy Treatment  The patient has been informed of current processes in place at Outpatient Rehab to protect patients from Covid-19 exposure including social distancing, schedule modifications, and new cleaning procedures. After discussing their particular risk with a therapist based on the patient's personal risk factors, the patient has decided to proceed with in-person therapy.  Patient Details  Name: Tracy Espinoza MRN: [id number] Date of Birth: 29-Sep-1946 No data recorded  Encounter Date: 04/07/2020   PT End of Session - 04/08/20 0837    Visit Number 14    Number of Visits 20    Date for PT Re-Evaluation 04/14/20    Authorization Type Medicare    Authorization Time Period 5/5 through 7/14    Authorization - Visit Number 14    Authorization - Number of Visits 20    Progress Note Due on Visit 20    PT Start Time 1330    PT Stop Time 1430    PT Time Calculation (min) 60 min    Activity Tolerance Patient tolerated treatment well;No increased pain    Behavior During Therapy WFL for tasks assessed/performed           Past Medical History:  Diagnosis Date  . Anxiety   . Arthritis   . Cancer ([place])    BASAL CELL SKIN CANCERS REMOVED  . Depression   . Edema    FEET/LEGS  . GERD (gastroesophageal reflux disease)   . Heart murmur   . History of hiatal hernia   . Hypertension   . Lymphedema   . MRSA (methicillin resistant staph aureus) culture positive    H/O    Past Surgical History:  Procedure Laterality Date  . ABDOMINAL HYSTERECTOMY    . APPENDECTOMY    . BACK SURGERY    . CATARACT EXTRACTION W/PHACO Left 03/28/2018   Procedure: CATARACT EXTRACTION PHACO AND INTRAOCULAR LENS PLACEMENT (IOC);  Surgeon: Eulogio Bear, MD;  Location: [facility] ORS;  Service: Ophthalmology;  Laterality: Left;  [place] 00:28.2 AP%  7.9 CDE 2.21 Fluid pack lot # [id number]  . CATARACT EXTRACTION W/PHACO Right 05/16/2018   Procedure: CATARACT EXTRACTION PHACO AND INTRAOCULAR LENS PLACEMENT (IOC);  Surgeon: Eulogio Bear, MD;  Location: [facility] ORS;  Service: Ophthalmology;  Laterality: Right;  [place] 00:26.2 AP% 7.6 CDE 2.01 Fluid pack lot # [id number]  . CHOLECYSTECTOMY    . COLONOSCOPY    . FRACTURE SURGERY     BONE ON SIDE OF RIGHT FOOT  . HERNIA REPAIR     UMBILICAL X 2  . JOINT REPLACEMENT Bilateral    knees  . KIDNEY SURGERY    . LUMBAR WOUND DEBRIDEMENT N/A 08/03/2016   Procedure: IRRIGATION AND DEBRIDEMENT LUMBAR WOUND;  Surgeon: Eustace Moore, MD;  Location: [place];  Service: Neurosurgery;  Laterality: N/A;  . OVARIAN CYST SURGERY    . REPAIR OF LEFT URETER Left 40 YRS AGO  . ROTATOR CUFF REPAIR  2014  . TONSILLECTOMY    . TUBAL LIGATION      There were no vitals filed for this visit.   Pelvic Floor Physical Therapy Treatment Note  SCREENING  Changes in medications, allergies, or medical history?: none    SUBJECTIVE  Patient reports: She is doing alright, feels like she is walking better, no change in FI Sx. Since last visit. She is surprised that she has  not been more sore from exercises, wonders if it is because of the gabapentin. She is going shopping tomorrow which she does not do very often.  Precautions:  Abdominal hysterectomy, Lymphedema (diagnosed ~ 3 years ago) (needs to be wearing pressure boots) L4-5 fusion in 2017.  Location of pain: R>L hip (neck/shoulder) Current pain: 4/10  (0/10) Max pain: 4/10 (0/10) Least pain: 0/10 (0/10) Nature of pain:"grinding" sore/achy  **0/10 following treatment when wearing back support.  Patient Goals: Would like to get a handle on her diarrhea so it is not so liquid and she can control it better. Would like to be able to go out to shop or go to church without fear of FI. Not have to change clothes more than 1 time per day due to  leakage.  OBJECTIVE  Changes in: Posture/Observations:  Hyperlordosis/kyphosis, FHP, prominent C7, and R thoracic scoliosis. (from prior session)  Range of Motion/Flexibilty:   Strength/MMT:  4+ for knee EXT and flex, 4/5 for hip flex, DF, hip EXT (from prior visit)  R hip ER ~ 4/5  Pelvic floor: (palpated over clothes with pads in place) No TTP externally, able to feel small contraction with cue to squeeze.  Abdominal:  10 finger diastasis, narrows to 8 with exhale/posterior pelvic tilt. Able to consistently do pelvic tilt with min to no cueing (from prior session)  Pt. Demonstrated significan improvement in stability and balance in standing with addition of lumbar support (size 2x)  Palpation:   Gait Analysis: Pt. Is standing more erect and has decreased R trendelenburg with use of rollator.  INTERVENTIONS THIS SESSION:   Therex: Reviewed standing hip-flexor stretch to improve Pt. Performance and compliance. Educated on and practiced standing hip ABD and EXT. Performed 2x10 on each side with seated rest between sets. Discussed the importance of trying to walk more on strengthening her hip stabilizer muscles and allowing the PFM to relax and just do their jobs.   Theract: educated on the potential benefits of using an abdominal binder in the home and given and used a lumbar support wrap to allow Pt. To recognize the benefit of having increased abdominal support on her balance and LBP. Pt. Recognized that her 4/10 pain disappeared when she had support. [place] Pt. Order a less-supportive abdominal binder for use when at home to see if it aids in digestion and helps decrease FI as well as to help decrease strain on the LB.   Gait-training: Reviewed importance of upright posture when using rollator and encouraged Pt. To take ~ 5 steps without rollator with and without the lumbar support to feel the difference in stability, Pt. Self-challenged asking PT to push the rollator further and  further, taking ~ 10 steps with the brace on.   Total time: 60                             PT Short Term Goals - 03/24/20 1331      PT SHORT TERM GOAL #1   Title Patient will demonstrate functional recruitment of TA with breathing, sit-to-stand, squatting/lifting, and walking to allow for improved pelvic brace coordination, improved balance, and decreased downward pressure on the pelvic organs.    Baseline Pt. demonstrates dysfunctional breathing and poor TA recruitment/awareness.    Time 5    Period Weeks    Status Achieved    Target Date 03/10/20      PT SHORT TERM GOAL #2   Title Patient  will demonstrate improved pelvic alignment and balance of musculature surrounding the pelvis to facilitate decreased PFM spasms and decrease pelvic pain.    Baseline R anterior/L posterior pelvic obliquity and R thoracic scoliotic curve. As of 6/23: is ~ 50% better. and is feeling like she has better balance.    Time 10    Period Weeks    Status On-going    Target Date 04/14/20      PT SHORT TERM GOAL #3   Title Patient will demonstrate HEP x1 in the clinic to demonstrate understanding and proper form to allow for further improvement.    Baseline Pt. lacks knowledge of which therapeutic exercises can help decrease her pain/Sx.    Time 5    Period Weeks    Status Achieved    Target Date 03/10/20      PT SHORT TERM GOAL #4   Title Pt. will demonstrate implementation of behavioral modifications such as fluid intake and use of Urge suppression technique to allow for decreased UUI/frequency.    Baseline Pt. having UUI daily, drinking a lot of Mtn. Dew    Time 5    Period Weeks    Status New    Target Date 03/10/20             PT Long Term Goals - 03/24/20 1441      PT LONG TERM GOAL #1   Title Patient will report no episodes of Fecal Incontinence over the past two weeks to demonstrate improved function and quality of life and to allow her to participate in going  to church and shopping as part of her occupation.    Baseline Pt. having to change clothes 3 times per day and does not eat if leaving the house due to [place]. As of 6/23: Pt. only changing 1-2 times per day but still does not feel like she can eat if she has to leave the house.    Time 20    Period Weeks    Status On-going    Target Date 06/23/20      PT LONG TERM GOAL #2   Title Patient will describe pain no greater than 3/10 during standing or walking up to 30 min. to demonstrate improved functional ability.    Baseline pain increases to 6/10 (enough to warrant resting) after 15 min. of standing or walking. As of 6/23: pain 4/10 at greatest with ADL's but has not walked for exercise yet.    Time 10    Period Weeks    Status On-going    Target Date 04/14/20      PT LONG TERM GOAL #3   Title Pt. will improve in FOTO score by 15 points in each category to demonstrate improved function.    Baseline FOTO PFDI Prolapse: 54 PFDI Bowel: 63, Urinary Problem: 44, Bowel Leakage 46, PFDI Urinary: 54, PFDI Pain 46.    Time 20    Period Weeks    Status On-going    Target Date 06/23/20                 Plan - 04/08/20 2297    Clinical Impression Statement Pt. Responded well to all interventions today, demonstrating improved gait mechanics with and without addition of lumbar support with improved confidence and balance with support, as well as understanding and correct performance of all education and exercises provided today. They will continue to benefit from skilled physical therapy to work toward remaining goals and maximize function as well as  decrease likelihood of symptom increase or recurrence.    PT Next Visit Plan Review hip ABD/EXT and hip-flexor stretch and give as HEP, further thoracic mobility, review hip-flexor stretch, side-stretch, LB stretch, standing pelvic tilts, weight shifts and add kegels to urge-suppression if appropriate. Assess internally when appropriate    PT Home  Exercise Plan soda-can theory, belly breathing, urge suppression (breathing only), sit-to-stand with exhale, supine-to-sit, pelvic tilt with obliques, PFM and TA, hook-lying pelvic tilts, R hip EXT/heel press, heel-lift for LLE, hip IR with yellow band, scapular retraction, chin-tucks. weight-shift balances, supine chin-tucks, hip-flexor stretch, low back stretch, side-stretch    Consulted and Agree with Plan of Care Patient           Patient will benefit from skilled therapeutic intervention in order to improve the following deficits and impairments:     Visit Diagnosis: Other idiopathic scoliosis, thoracolumbar region  Other muscle spasm  Other lack of coordination  Sacrococcygeal disorders, not elsewhere classified     Problem List Patient Active Problem List   Diagnosis Date Noted  . Restless legs 10/24/2018  . 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 GERD (gastroesophageal reflux disease) 03/20/2018  . Lymphedema 12/25/2017  . Insomnia 12/18/2017  . Lung mass 03/13/2017  . Coin lesion 03/13/2017  . Advanced care planning/counseling discussion 03/13/2017  . Back pain at L4-L5 level 01/01/2017  . Multiple pulmonary nodules 01/01/2017  . Incidental pulmonary nodule, greater than or equal to 52mm 01/01/2017  . Bilateral leg edema 09/19/2016  . Normocytic normochromic anemia 08/03/2016  . Spondylolisthesis at L4-L5 level 07/10/2016  . Spinal stenosis of lumbar region with radiculopathy 04/27/2016  . Anxiety 04/27/2016  . Trochanteric bursitis of right hip 10/08/2015  . Hypertension 03/29/2015  . Hypercholesteremia 03/29/2015  . BMI 45.0-49.9, adult ([place]) 03/29/2015  . Arthralgia of hands, bilateral 03/29/2015   Willa Rough DPT, ATC Willa Rough 04/08/2020, 8:40 AM  [place] MAIN [facility] SERVICES [address] [place], [place], [address] Phone: [phone]   Fax:  [phone]  Name: Tracy Espinoza MRN:  [id number] Date of Birth: [birth year]

## 2020-04-09 ENCOUNTER — Ambulatory Visit: Payer: Medicare Other

## 2020-04-12 ENCOUNTER — Other Ambulatory Visit: Payer: Self-pay

## 2020-04-12 ENCOUNTER — Ambulatory Visit: Payer: Medicare Other

## 2020-04-12 DIAGNOSIS — M62838 Other muscle spasm: Secondary | ICD-10-CM | POA: Diagnosis not present

## 2020-04-12 DIAGNOSIS — M4125 Other idiopathic scoliosis, thoracolumbar region: Secondary | ICD-10-CM | POA: Diagnosis not present

## 2020-04-12 DIAGNOSIS — M533 Sacrococcygeal disorders, not elsewhere classified: Secondary | ICD-10-CM | POA: Diagnosis not present

## 2020-04-12 DIAGNOSIS — R278 Other lack of coordination: Secondary | ICD-10-CM | POA: Diagnosis not present
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
## 2020-04-12 NOTE — Patient Instructions (Signed)
Hip Abduction (Standing)   **AIM FOR ~ 45 DEGREES BETWEEN THE SIDE AND THE BACK  Stand with support. Squeeze deep core and hold. Start with leg to the side/back on your toe and gently squeeze the outer hip to lift the toe off of the ground. Hold for 1 second then repeat. You should feel this in the outer hip, not the front, not your side,  And not your back. Repeat __10_ times. Do _3__ sets  a day.  HIP Extension - Standing    Stand with support. Squeeze deep core and hold. Start with leg to the back on your toe and gently squeeze the buttock to lift the toe off of the ground. Hold for 1 second then repeat. You should feel this in the outer hip, not the front, not your side,  And not your back. Repeat __10_ times. Do _3__ sets  a day.

## 2020-04-14 ENCOUNTER — Other Ambulatory Visit: Payer: Self-pay

## 2020-04-14 ENCOUNTER — Ambulatory Visit: Payer: Medicare Other

## 2020-04-14 DIAGNOSIS — M4125 Other idiopathic scoliosis, thoracolumbar region: Secondary | ICD-10-CM

## 2020-04-14 DIAGNOSIS — R278 Other lack of coordination: Secondary | ICD-10-CM | POA: Diagnosis not present

## 2020-04-14 DIAGNOSIS — M533 Sacrococcygeal disorders, not elsewhere classified: Secondary | ICD-10-CM

## 2020-04-14 DIAGNOSIS — M62838 Other muscle spasm: Secondary | ICD-10-CM
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
## 2020-04-19 ENCOUNTER — Ambulatory Visit: Payer: Medicare Other

## 2020-04-19 ENCOUNTER — Other Ambulatory Visit: Payer: Self-pay

## 2020-04-19 DIAGNOSIS — M4125 Other idiopathic scoliosis, thoracolumbar region: Secondary | ICD-10-CM | POA: Diagnosis not present

## 2020-04-19 DIAGNOSIS — M533 Sacrococcygeal disorders, not elsewhere classified: Secondary | ICD-10-CM

## 2020-04-19 DIAGNOSIS — R278 Other lack of coordination: Secondary | ICD-10-CM | POA: Diagnosis not present

## 2020-04-19 DIAGNOSIS — M62838 Other muscle spasm: Secondary | ICD-10-CM | POA: Diagnosis not present

## 2020-04-19 NOTE — Patient Instructions (Signed)
Kegel exercises:    With neutral spine, tighten pelvic floor by imagining you are stopping the flow of urine, squeezing only around the vagina and anus.  Quick-Flicks: Pull up and in quickly and then relax allowing just enough time for the muscles to full lengthen before the next contraction. Do _10__ repetitions in a row, stopping if the pelvic floor muscle gets tired and other muscles try to take over.  Long-Holds: Hold for _5-10__ seconds and then fully release, repeat __5_ times. (only hold until you can feel the muscles "give up")  Repeat both of these exercises _3-5__ times throughout the day     If this is difficult, start by performing while lying down as shown. As you get stronger, you can try in sitting, standing, etc. Make sure that you are NOT contracting your buttocks or inner thigh muscles!  Tip: If you are having a hard time feeling the muscles, try using a hand-held mirror to observe the muscles lift and draw in or feel with your hand to make sure you are squeezing when you think you are squeezing, not bearing down. Some people find it helpful to pretend you are "picking blueberries" with the vagina to feel the drawing in or try to make the clitoris "nod" and pull your sitz bones in toward the middle.

## 2020-04-19 NOTE — Therapy (Signed)
[place] MAIN [facility] SERVICES [address] [place], [place], [address] Phone: [phone]   Fax:  [phone]  Physical Therapy Treatment  The patient has been informed of current processes in place at Outpatient Rehab to protect patients from Covid-19 exposure including social distancing, schedule modifications, and new cleaning procedures. After discussing their particular risk with a therapist based on the patient's personal risk factors, the patient has decided to proceed with in-person therapy.  Patient Details  Name: Tracy Espinoza MRN: [id number] Date of Birth: [birth year] No data recorded  Encounter Date: 04/19/2020   PT End of Session - 04/19/20 1458    Visit Number 17    Number of Visits 20    Date for PT Re-Evaluation 04/14/20    Authorization Type Medicare    Authorization Time Period 5/5 through 7/14    Authorization - Visit Number 15    Authorization - Number of Visits 20    Progress Note Due on Visit 20    PT Start Time 1300    PT Stop Time 1400    PT Time Calculation (min) 60 min    Activity Tolerance Patient tolerated treatment well;No increased pain    Behavior During Therapy WFL for tasks assessed/performed           Past Medical History:  Diagnosis Date   Anxiety    Arthritis    Cancer ([place])    BASAL CELL SKIN CANCERS REMOVED   Depression    Edema    FEET/LEGS   GERD (gastroesophageal reflux disease)    Heart murmur    History of hiatal hernia    Hypertension    Lymphedema    MRSA (methicillin resistant staph aureus) culture positive    H/O    Past Surgical History:  Procedure Laterality Date   ABDOMINAL HYSTERECTOMY     APPENDECTOMY     BACK SURGERY     CATARACT EXTRACTION W/PHACO Left 03/28/2018   Procedure: CATARACT EXTRACTION PHACO AND INTRAOCULAR LENS PLACEMENT ([place]);  Surgeon: Eulogio Bear, MD;  Location: [facility] ORS;  Service: Ophthalmology;  Laterality: Left;  [place] 00:28.2 AP%  7.9 CDE 2.21 Fluid pack lot # [id number]   CATARACT EXTRACTION W/PHACO Right 05/16/2018   Procedure: CATARACT EXTRACTION PHACO AND INTRAOCULAR LENS PLACEMENT ([place]);  Surgeon: Eulogio Bear, MD;  Location: [facility] ORS;  Service: Ophthalmology;  Laterality: Right;  [place] 00:26.2 AP% 7.6 CDE 2.01 Fluid pack lot # [id number]   CHOLECYSTECTOMY     COLONOSCOPY     FRACTURE SURGERY     BONE ON SIDE OF RIGHT FOOT   HERNIA REPAIR     UMBILICAL X 2   JOINT REPLACEMENT Bilateral    knees   KIDNEY SURGERY     LUMBAR WOUND DEBRIDEMENT N/A 08/03/2016   Procedure: IRRIGATION AND DEBRIDEMENT LUMBAR WOUND;  Surgeon: Eustace Moore, MD;  Location: [facility];  Service: Neurosurgery;  Laterality: N/A;   OVARIAN CYST SURGERY     REPAIR OF LEFT URETER Left 40 YRS AGO   ROTATOR CUFF REPAIR  2014   TONSILLECTOMY     TUBAL LIGATION      There were no vitals filed for this visit.  Pelvic Floor Physical Therapy Treatment Note  SCREENING  Changes in medications, allergies, or medical history?: none    SUBJECTIVE  Patient reports: She was not able to shopping on Wednesday due to the rain but went on Friday. On Friday it was so hot she could not  wear her brace. Her back has been a little upset since, even with the brace on. She started having major UI while sleeping, waking up with with everything saturated. She is back on her acid reflux medicine that she had stopped because it is so bad. Knee has stayed happy, has been able to do chin-tucks in the mirror. No change in bowels.  Precautions:  Abdominal hysterectomy, Lymphedema (diagnosed ~ 3 years ago) (needs to be wearing pressure boots) L4-5 fusion in 2017.  Location of pain: LB (neck/shoulder) Current pain: 3/10  (2/10) Max pain: 6/10 (4/10) Least pain: 0/10 (0/10) Nature of pain:"grinding" sore/achy  **no pain following treatment  Patient Goals: Would like to get a handle on her diarrhea so it is not so liquid and she can control it  better. Would like to be able to go out to shop or go to church without fear of FI. Not have to change clothes more than 1 time per day due to leakage.  OBJECTIVE  Changes in: Posture/Observations:  Hyperlordosis/kyphosis, FHP, prominent C7, and R thoracic scoliosis.    Range of Motion/Flexibilty:   Strength/MMT:  4+ for knee EXT and flex, 4/5 for hip flex, DF, hip EXT R hip IR ~ 4/5 (from prior visit)  Pelvic Floor External Exam: Introitus Appears: mildly gaping Skin integrity: vaginal warts and reddened, moist skin. Palpation: TTP through B STP Cough: not assessed Prolapse visible?: no Scar mobility: decreased at 5 and 7 o'clock  Internal Vaginal Exam: Strength (PERF): 3/5, 10 sec, 1 time (~ 4/5 following release) Symmetry: similar B Palpation: TTP to all PFM internally with scar restriction at B posterior fourchette. Prolapse: mild posterior wall with bearing down, ~ 1 cm above introitus.   Internal Rectal Exam: Deferred indefinately Strength (PERF): Symmetry: Palpation: Prolapse:   Abdominal:  10 finger diastasis, narrows to 8 with exhale/posterior pelvic tilt. Able to consistently do pelvic tilt with min to no cueing (from prior session)  Palpation:  Gait Analysis: Slow cadence but no evident trendelenburg and Pt. Did not require cue to look up. Still tends to stay far right.   INTERVENTIONS THIS SESSION:  Therex: Educated on and practiced Kegels with VC to "pick the blueberries" and to do ~ 50% as pt. Was having accessory back and rectus abdominus muscles that limited PFM recruitment.  Manual: Assessed PFM and performed scar release to B posterior fourchette and TP release to B PR/PC and IC posteriorly to decrease spasm and pain and allow for improved balance of musculature for improved function and decreased symptoms.  Total time: 60                              PT Short Term Goals - 04/12/20 1424      PT SHORT TERM GOAL #1    Title Patient will demonstrate functional recruitment of TA with breathing, sit-to-stand, squatting/lifting, and walking to allow for improved pelvic brace coordination, improved balance, and decreased downward pressure on the pelvic organs.    Baseline Pt. demonstrates dysfunctional breathing and poor TA recruitment/awareness.    Time 5    Period Weeks    Status Achieved    Target Date 03/10/20      PT SHORT TERM GOAL #2   Title Patient will demonstrate improved pelvic alignment and balance of musculature surrounding the pelvis to facilitate decreased PFM spasms and decrease pelvic pain.    Baseline R anterior/L posterior pelvic obliquity and R thoracic  scoliotic curve. As of 6/23: is ~ 50% better. and is feeling like she has better balance.    Time 10    Period Weeks    Status Achieved    Target Date 04/14/20      PT SHORT TERM GOAL #3   Title Patient will demonstrate HEP x1 in the clinic to demonstrate understanding and proper form to allow for further improvement.    Baseline Pt. lacks knowledge of which therapeutic exercises can help decrease her pain/Sx.    Time 5    Period Weeks    Status Achieved    Target Date 03/10/20      PT SHORT TERM GOAL #4   Title Pt. will demonstrate implementation of behavioral modifications such as fluid intake and use of Urge suppression technique to allow for decreased UUI/frequency.    Baseline Pt. having UUI daily, drinking a lot of Mtn. Dew. As of 7/12: is drinking some ~ 8 oz of water, ~ 4-5 cups of crystal light, ~ 1-2 decaffinated Mtn. Dew. and occasional glass of milk and has noticed decreased Urinary incontinece.    Time 5    Period Weeks    Status Achieved    Target Date 03/10/20             PT Long Term Goals - 04/12/20 1428      PT LONG TERM GOAL #1   Title Patient will report no episodes of Fecal Incontinence over the past two weeks to demonstrate improved function and quality of life and to allow her to participate in going to  church and shopping as part of her occupation.    Baseline Pt. having to change clothes 3 times per day and does not eat if leaving the house due to [place]. As of 6/23: Pt. only changing 1-2 times per day but still does not feel like she can eat if she has to leave the house. as of 7/12: No change n frequency.    Time 20    Period Weeks    Status On-going    Target Date 06/23/20      PT LONG TERM GOAL #2   Title Patient will describe pain no greater than 3/10 during standing or walking up to 30 min. to demonstrate improved functional ability and allow for return to church.    Baseline pain increases to 6/10 (enough to warrant resting) after 15 min. of standing or walking. As of 6/23: pain 4/10 at greatest with ADL's but has not walked for exercise yet. As of 7/12: has not had pain > 4/10 without brace and able to walk with brace into PT session withot pain.    Time 10    Period Weeks    Status On-going    Target Date 06/21/20      PT LONG TERM GOAL #3   Title Pt. will improve in FOTO score by 15 points in each category to demonstrate improved function.    Baseline FOTO PFDI Prolapse: 54 PFDI Bowel: 63, Urinary Problem: 44, Bowel Leakage 46, PFDI Urinary: 54, PFDI Pain 46. As of 7/13: FOTO PFDI Prolapse:21 PFDI Bowel: 33, Urinary Problem: 39, Bowel Leakage 54, PFDI Urinary: 42, PFDI Pain 0.    Time 20    Period Weeks    Status On-going    Target Date 06/23/20                 Plan - 04/19/20 1458    Clinical Impression Statement Pt. Responded well  to all interventions today, demonstrating improved PFM relaxation, scar mobility, and recruitment/coordination of PFM for kegel, as well as understanding and correct performance of all education and exercises provided today. They will continue to benefit from skilled physical therapy to work toward remaining goals and maximize function as well as decrease likelihood of symptom increase or recurrence.    PT Next Visit Plan Ask about success with  using Kegel for urge-suppression. Review hip ABD/EXT and hip-flexor stretch and give as HEP, further thoracic mobility, review hip-flexor stretch, side-stretch, LB stretch, standing pelvic tilts, weight shifts and add kegels to urge-suppression if appropriate. Assess internally when appropriate    PT Home Exercise Plan soda-can theory, belly breathing, urge suppression (breathing only), sit-to-stand with exhale, supine-to-sit, pelvic tilt with obliques, PFM and TA, hook-lying pelvic tilts, R hip EXT/heel press, heel-lift for LLE, hip IR with yellow band, scapular retraction, chin-tucks. weight-shift balances, supine chin-tucks, hip-flexor stretch, low back stretch, side-stretch, standing hip EXT/ABD, Kegels with ~ 5-10 sec. hold    Consulted and Agree with Plan of Care Patient           Patient will benefit from skilled therapeutic intervention in order to improve the following deficits and impairments:     Visit Diagnosis: Other lack of coordination  Other idiopathic scoliosis, thoracolumbar region  Other muscle spasm  Sacrococcygeal disorders, not elsewhere classified     Problem List Patient Active Problem List   Diagnosis Date Noted   Restless legs 10/24/2018   Osteomyelitis of lumbar vertebra ([place]) 10/24/2018   GERD (gastroesophageal reflux disease) 03/20/2018   Lymphedema 12/25/2017   Insomnia 12/18/2017   Lung mass 03/13/2017   Coin lesion 03/13/2017   Advanced care planning/counseling discussion 03/13/2017   Back pain at L4-L5 level 01/01/2017   Multiple pulmonary nodules 01/01/2017   Incidental pulmonary nodule, greater than or equal to 79mm 01/01/2017   Bilateral leg edema 09/19/2016   Normocytic normochromic anemia 08/03/2016   Spondylolisthesis at L4-L5 level 07/10/2016   Spinal stenosis of lumbar region with radiculopathy 04/27/2016   Anxiety 04/27/2016   Trochanteric bursitis of right hip 10/08/2015   Hypertension 03/29/2015    Hypercholesteremia 03/29/2015   BMI 45.0-49.9, adult ([place]) 03/29/2015   Arthralgia of hands, bilateral 03/29/2015   Willa Rough DPT, ATC Willa Rough 04/19/2020, 2:59 PM  [place] MAIN [facility] SERVICES [address] [place], [place], [address] Phone: [phone]   Fax:  [phone]  Name: NYOMIE EHRLICH MRN: [id number] Date of Birth: [birth year]

## 2020-04-21 ENCOUNTER — Other Ambulatory Visit: Payer: Self-pay

## 2020-04-21 ENCOUNTER — Ambulatory Visit: Payer: Medicare Other

## 2020-04-21 DIAGNOSIS — M4125 Other idiopathic scoliosis, thoracolumbar region: Secondary | ICD-10-CM

## 2020-04-21 DIAGNOSIS — R278 Other lack of coordination: Secondary | ICD-10-CM | POA: Diagnosis not present

## 2020-04-21 DIAGNOSIS — M62838 Other muscle spasm: Secondary | ICD-10-CM | POA: Diagnosis not present

## 2020-04-21 DIAGNOSIS — M533 Sacrococcygeal disorders, not elsewhere classified: Secondary | ICD-10-CM

## 2020-04-21 NOTE — Therapy (Signed)
[place] MAIN [facility] SERVICES [address] [place], [place], [address] Phone: [phone]   Fax:  [phone]  Physical Therapy Treatment  The patient has been informed of current processes in place at Outpatient Rehab to protect patients from Covid-19 exposure including social distancing, schedule modifications, and new cleaning procedures. After discussing their particular risk with a therapist based on the patient's personal risk factors, the patient has decided to proceed with in-person therapy.   Patient Details  Name: Tracy Espinoza MRN: [id number] Date of Birth: Oct 29, 1945 No data recorded  Encounter Date: 04/21/2020   PT End of Session - 04/21/20 1659    Visit Number 18    Number of Visits 35    Date for PT Re-Evaluation 06/23/20    Authorization Type Medicare    Authorization Time Period 5/5 through 7/14    Authorization - Visit Number 3    Authorization - Number of Visits 20    Progress Note Due on Visit 20    PT Start Time 1330    PT Stop Time 1430    PT Time Calculation (min) 60 min    Activity Tolerance Patient tolerated treatment well;No increased pain    Behavior During Therapy WFL for tasks assessed/performed           Past Medical History:  Diagnosis Date  . Anxiety   . Arthritis   . Cancer ([place])    BASAL CELL SKIN CANCERS REMOVED  . Depression   . Edema    FEET/LEGS  . GERD (gastroesophageal reflux disease)   . Heart murmur   . History of hiatal hernia   . Hypertension   . Lymphedema   . MRSA (methicillin resistant staph aureus) culture positive    H/O    Past Surgical History:  Procedure Laterality Date  . ABDOMINAL HYSTERECTOMY    . APPENDECTOMY    . BACK SURGERY    . CATARACT EXTRACTION W/PHACO Left 03/28/2018   Procedure: CATARACT EXTRACTION PHACO AND INTRAOCULAR LENS PLACEMENT ([facility]);  Surgeon: Eulogio Bear, MD;  Location: [facility] ORS;  Service: Ophthalmology;  Laterality: Left;  [place] 00:28.2 AP%  7.9 CDE 2.21 Fluid pack lot # [id number]  . CATARACT EXTRACTION W/PHACO Right 05/16/2018   Procedure: CATARACT EXTRACTION PHACO AND INTRAOCULAR LENS PLACEMENT ([facility]);  Surgeon: Eulogio Bear, MD;  Location: [facility] ORS;  Service: Ophthalmology;  Laterality: Right;  [place] 00:26.2 AP% 7.6 CDE 2.01 Fluid pack lot # [id number]  . CHOLECYSTECTOMY    . COLONOSCOPY    . FRACTURE SURGERY     BONE ON SIDE OF RIGHT FOOT  . HERNIA REPAIR     UMBILICAL X 2  . JOINT REPLACEMENT Bilateral    knees  . KIDNEY SURGERY    . LUMBAR WOUND DEBRIDEMENT N/A 08/03/2016   Procedure: IRRIGATION AND DEBRIDEMENT LUMBAR WOUND;  Surgeon: Eustace Moore, MD;  Location: [facility];  Service: Neurosurgery;  Laterality: N/A;  . OVARIAN CYST SURGERY    . REPAIR OF LEFT URETER Left 40 YRS AGO  . ROTATOR CUFF REPAIR  2014  . TONSILLECTOMY    . TUBAL LIGATION      There were no vitals filed for this visit.    Pelvic Floor Physical Therapy Treatment Note  SCREENING  Changes in medications, allergies, or medical history?: none    SUBJECTIVE  Patient reports: Her back is sore low down and into the R hip since yesterday. Her urinary incontinence "has gotten dreadful" She is still  having a lot of UI when sleeping. She is drinking more water and not more than 1 mountain dew. Drinks water mostly in the morning. She is having slightly firmer stool and is getting an urge to go to the bathroom, still not getting to the bathroom in time ~ 60% of the time. Feels a lot steadier on her feet.   Precautions:  Abdominal hysterectomy, Lymphedema (diagnosed ~ 3 years ago) (needs to be wearing pressure boots) L4-5 fusion in 2017.  Location of pain: LB (neck/shoulder) Current pain: 3/10  (2/10) Max pain: 6/10 (4/10) Least pain: 0/10 (0/10) Nature of pain:"grinding" sore/achy  **no change, not addressed today.  Patient Goals: Would like to get a handle on her diarrhea so it is not so liquid and she can control it better. Would  like to be able to go out to shop or go to church without fear of FI. Not have to change clothes more than 1 time per day due to leakage.  OBJECTIVE  Changes in: Posture/Observations:  Hyperlordosis/kyphosis, FHP, prominent C7, and R thoracic scoliosis. (from prior note)   Range of Motion/Flexibilty:   Strength/MMT:  4+ for knee EXT and flex, 4/5 for hip flex, DF, hip EXT R hip IR ~ 4/5 (from prior visit)  Pelvic Floor External Exam: Introitus Appears: mildly gaping Skin integrity: vaginal warts and reddened, moist skin. Palpation: TTP through B STP Cough: not assessed Prolapse visible?: no Scar mobility: decreased at 5 and 7 o'clock  Internal Vaginal Exam: Strength (PERF): 3/5, 10 sec, 1 time (~ 4/5 following release) Symmetry: similar B Palpation: TTP to all PFM internally with scar restriction at B posterior fourchette. Prolapse: mild posterior wall with bearing down, ~ 1 cm above introitus.  Today: Pt. Demonstrated spasms though B anterior PR/PC and L OI. Had some difficulty recruiting/coordinating PFM for kegels due to her back being arched in hook-lying position.   Internal Rectal Exam: Deferred indefinately Strength (PERF): Symmetry: Palpation: Prolapse:   Abdominal:  10 finger diastasis, narrows to 8 with exhale/posterior pelvic tilt. Able to consistently do pelvic tilt with min to no cueing (from prior session)  Palpation:  Gait Analysis: Slow cadence but no evident trendelenburg and Pt. Did require one que to look up but was able to walk along L wall of the hall without issue.  INTERVENTIONS THIS SESSION:  Therex: Discussed how to modify a child's pose stretch for her to help decrease PFM tension and allow for long-term increased control for decreased dysfunction and incontinence.  Manual: Performed TP release to B anterior PR/PC and L OI internally to decrease spasm and pain and allow for improved balance of musculature for improved function, and decreased  symptoms.  Total time: 60                             PT Short Term Goals - 04/12/20 1424      PT SHORT TERM GOAL #1   Title Patient will demonstrate functional recruitment of TA with breathing, sit-to-stand, squatting/lifting, and walking to allow for improved pelvic brace coordination, improved balance, and decreased downward pressure on the pelvic organs.    Baseline Pt. demonstrates dysfunctional breathing and poor TA recruitment/awareness.    Time 5    Period Weeks    Status Achieved    Target Date 03/10/20      PT SHORT TERM GOAL #2   Title Patient will demonstrate improved pelvic alignment and balance of musculature surrounding  the pelvis to facilitate decreased PFM spasms and decrease pelvic pain.    Baseline R anterior/L posterior pelvic obliquity and R thoracic scoliotic curve. As of 6/23: is ~ 50% better. and is feeling like she has better balance.    Time 10    Period Weeks    Status Achieved    Target Date 04/14/20      PT SHORT TERM GOAL #3   Title Patient will demonstrate HEP x1 in the clinic to demonstrate understanding and proper form to allow for further improvement.    Baseline Pt. lacks knowledge of which therapeutic exercises can help decrease her pain/Sx.    Time 5    Period Weeks    Status Achieved    Target Date 03/10/20      PT SHORT TERM GOAL #4   Title Pt. will demonstrate implementation of behavioral modifications such as fluid intake and use of Urge suppression technique to allow for decreased UUI/frequency.    Baseline Pt. having UUI daily, drinking a lot of Mtn. Dew. As of 7/12: is drinking some ~ 8 oz of water, ~ 4-5 cups of crystal light, ~ 1-2 decaffinated Mtn. Dew. and occasional glass of milk and has noticed decreased Urinary incontinece.    Time 5    Period Weeks    Status Achieved    Target Date 03/10/20             PT Long Term Goals - 04/12/20 1428      PT LONG TERM GOAL #1   Title Patient will report no  episodes of Fecal Incontinence over the past two weeks to demonstrate improved function and quality of life and to allow her to participate in going to church and shopping as part of her occupation.    Baseline Pt. having to change clothes 3 times per day and does not eat if leaving the house due to [place]. As of 6/23: Pt. only changing 1-2 times per day but still does not feel like she can eat if she has to leave the house. as of 7/12: No change n frequency.    Time 20    Period Weeks    Status On-going    Target Date 06/23/20      PT LONG TERM GOAL #2   Title Patient will describe pain no greater than 3/10 during standing or walking up to 30 min. to demonstrate improved functional ability and allow for return to church.    Baseline pain increases to 6/10 (enough to warrant resting) after 15 min. of standing or walking. As of 6/23: pain 4/10 at greatest with ADL's but has not walked for exercise yet. As of 7/12: has not had pain > 4/10 without brace and able to walk with brace into PT session withot pain.    Time 10    Period Weeks    Status On-going    Target Date 06/21/20      PT LONG TERM GOAL #3   Title Pt. will improve in FOTO score by 15 points in each category to demonstrate improved function.    Baseline FOTO PFDI Prolapse: 54 PFDI Bowel: 63, Urinary Problem: 44, Bowel Leakage 46, PFDI Urinary: 54, PFDI Pain 46. As of 7/13: FOTO PFDI Prolapse:21 PFDI Bowel: 33, Urinary Problem: 39, Bowel Leakage 54, PFDI Urinary: 42, PFDI Pain 0.    Time 20    Period Weeks    Status On-going    Target Date 06/23/20  Plan - 04/21/20 1700    Clinical Impression Statement Pt. Responded well to all interventions today, demonstrating decreased PFM spasm anteriorly and improved standing balance/stability, as well as understanding and correct performance of all education and exercises provided today. They will continue to benefit from skilled physical therapy to work toward remaining  goals and maximize function as well as decrease likelihood of symptom increase or recurrence.    PT Next Visit Plan Ask about success with using Kegel for urge-suppression. Review hip ABD/EXT and hip-flexor stretch and give as HEP, further thoracic mobility, review hip-flexor stretch, side-stretch, LB stretch, standing pelvic tilts, weight shifts and add kegels to urge-suppression if appropriate. Assess internally when appropriate    PT Home Exercise Plan soda-can theory, belly breathing, urge suppression (breathing only), sit-to-stand with exhale, supine-to-sit, pelvic tilt with obliques, PFM and TA, hook-lying pelvic tilts, R hip EXT/heel press, heel-lift for LLE, hip IR with yellow band, scapular retraction, chin-tucks. weight-shift balances, supine chin-tucks, hip-flexor stretch, low back stretch, side-stretch, standing hip EXT/ABD, Kegels with ~ 5-10 sec. hold, modified child's pose    Consulted and Agree with Plan of Care Patient           Patient will benefit from skilled therapeutic intervention in order to improve the following deficits and impairments:     Visit Diagnosis: Other idiopathic scoliosis, thoracolumbar region  Other muscle spasm  Sacrococcygeal disorders, not elsewhere classified  Other lack of coordination     Problem List Patient Active Problem List   Diagnosis Date Noted  . Restless legs 10/24/2018  . Osteomyelitis of lumbar vertebra ([place]) 10/24/2018  . GERD (gastroesophageal reflux disease) 03/20/2018  . Lymphedema 12/25/2017  . Insomnia 12/18/2017  . Lung mass 03/13/2017  . Coin lesion 03/13/2017  . Advanced care planning/counseling discussion 03/13/2017  . Back pain at L4-L5 level 01/01/2017  . Multiple pulmonary nodules 01/01/2017  . Incidental pulmonary nodule, greater than or equal to 34mm 01/01/2017  . Bilateral leg edema 09/19/2016  . Normocytic normochromic anemia 08/03/2016  . Spondylolisthesis at L4-L5 level 07/10/2016  . Spinal stenosis of  lumbar region with radiculopathy 04/27/2016  . Anxiety 04/27/2016  . Trochanteric bursitis of right hip 10/08/2015  . Hypertension 03/29/2015  . Hypercholesteremia 03/29/2015  . BMI 45.0-49.9, adult ([place]) 03/29/2015  . Arthralgia of hands, bilateral 03/29/2015   Willa Rough DPT, ATC Willa Rough 04/21/2020, 5:02 PM  [place] MAIN [facility] SERVICES [address] [place], [place], [address] Phone: [phone]   Fax:  [phone]  Name: AKERA SNOWBERGER MRN: [id number] Date of Birth: 1946/03/09

## 2020-04-23 ENCOUNTER — Other Ambulatory Visit: Payer: Self-pay | Admitting: Family Medicine

## 2020-04-23 ENCOUNTER — Ambulatory Visit (INDEPENDENT_AMBULATORY_CARE_PROVIDER_SITE_OTHER): Payer: Medicare Other

## 2020-04-23 VITALS — Ht 61.0 in | Wt 274.0 lb

## 2020-04-23 DIAGNOSIS — Z0000 Encounter for general adult medical examination without abnormal findings: Secondary | ICD-10-CM | POA: Diagnosis not present

## 2020-04-23 NOTE — Patient Instructions (Signed)
Tracy Espinoza , Thank you for taking time to come for your Medicare Wellness Visit. I appreciate your ongoing commitment to your health goals. Please review the following plan we discussed and let me know if I can assist you in the future.   Screening recommendations/referrals: Colonoscopy: completed 06/16/2014 Mammogram: completed 07/14/2019, due 07/13/2020 Bone Density: completed 06/16/2014 Recommended yearly ophthalmology/optometry visit for glaucoma screening and checkup Recommended yearly dental visit for hygiene and checkup  Vaccinations: Influenza vaccine: completed 06/17/2019, due 05/02/2020 Pneumococcal vaccine: completed 08/25/2013 Tdap vaccine: completed 08/04/2014, due 08/04/2024 Shingles vaccine: completed   Covid-19:10/30/2019, 10/09/2019  Advanced directives: Please bring a copy of your POA (Power of [place]) and/or Living Will to your next appointment.   Conditions/risks identified: none  Next appointment: Follow up in one year for your annual wellness visit    Preventive Care 65 Years and Older, Female Preventive care refers to lifestyle choices and visits with your health care provider that can promote health and wellness. What does preventive care include?  A yearly physical exam. This is also called an annual well check.  Dental exams once or twice a year.  Routine eye exams. Ask your health care provider how often you should have your eyes checked.  Personal lifestyle choices, including:  Daily care of your teeth and gums.  Regular physical activity.  Eating a healthy diet.  Avoiding tobacco and drug use.  Limiting alcohol use.  Practicing safe sex.  Taking low-dose aspirin every day.  Taking vitamin and mineral supplements as recommended by your health care provider. What happens during an annual well check? The services and screenings done by your health care provider during your annual well check will depend on your age, overall health, lifestyle risk  factors, and family history of disease. Counseling  Your health care provider may ask you questions about your:  Alcohol use.  Tobacco use.  Drug use.  Emotional well-being.  Home and relationship well-being.  Sexual activity.  Eating habits.  History of falls.  Memory and ability to understand (cognition).  Work and work Statistician.  Reproductive health. Screening  You may have the following tests or measurements:  Height, weight, and BMI.  Blood pressure.  Lipid and cholesterol levels. These may be checked every 5 years, or more frequently if you are over 82 years old.  Skin check.  Lung cancer screening. You may have this screening every year starting at age 30 if you have a 30-pack-year history of smoking and currently smoke or have quit within the past 15 years.  Fecal occult blood test (FOBT) of the stool. You may have this test every year starting at age 17.  Flexible sigmoidoscopy or colonoscopy. You may have a sigmoidoscopy every 5 years or a colonoscopy every 10 years starting at age 53.  Hepatitis C blood test.  Hepatitis B blood test.  Sexually transmitted disease (STD) testing.  Diabetes screening. This is done by checking your blood sugar (glucose) after you have not eaten for a while (fasting). You may have this done every 1-3 years.  Bone density scan. This is done to screen for osteoporosis. You may have this done starting at age 42.  Mammogram. This may be done every 1-2 years. Talk to your health care provider about how often you should have regular mammograms. Talk with your health care provider about your test results, treatment options, and if necessary, the need for more tests. Vaccines  Your health care provider may recommend certain vaccines, such as:  Influenza  vaccine. This is recommended every year.  Tetanus, diphtheria, and acellular pertussis (Tdap, Td) vaccine. You may need a Td booster every 10 years.  Zoster vaccine. You  may need this after age 58.  Pneumococcal 13-valent conjugate (PCV13) vaccine. One dose is recommended after age 64.  Pneumococcal polysaccharide (PPSV23) vaccine. One dose is recommended after age 54. Talk to your health care provider about which screenings and vaccines you need and how often you need them. This information is not intended to replace advice given to you by your health care provider. Make sure you discuss any questions you have with your health care provider. Document Released: 10/15/2015 Document Revised: 06/07/2016 Document Reviewed: 07/20/2015 Elsevier Interactive Patient Education  2017 [place] Prevention in the Home Falls can cause injuries. They can happen to people of all ages. There are many things you can do to make your home safe and to help prevent falls. What can I do on the outside of my home?  Regularly fix the edges of walkways and driveways and fix any cracks.  Remove anything that might make you trip as you walk through a door, such as a raised step or threshold.  Trim any bushes or trees on the path to your home.  Use bright outdoor lighting.  Clear any walking paths of anything that might make someone trip, such as rocks or tools.  Regularly check to see if handrails are loose or broken. Make sure that both sides of any steps have handrails.  Any raised decks and porches should have guardrails on the edges.  Have any leaves, snow, or ice cleared regularly.  Use sand or salt on walking paths during winter.  Clean up any spills in your garage right away. This includes oil or grease spills. What can I do in the bathroom?  Use night lights.  Install grab bars by the toilet and in the tub and shower. Do not use towel bars as grab bars.  Use non-skid mats or decals in the tub or shower.  If you need to sit down in the shower, use a plastic, non-slip stool.  Keep the floor dry. Clean up any water that spills on the floor as soon as  it happens.  Remove soap buildup in the tub or shower regularly.  Attach bath mats securely with double-sided non-slip rug tape.  Do not have throw rugs and other things on the floor that can make you trip. What can I do in the bedroom?  Use night lights.  Make sure that you have a light by your bed that is easy to reach.  Do not use any sheets or blankets that are too big for your bed. They should not hang down onto the floor.  Have a firm chair that has side arms. You can use this for support while you get dressed.  Do not have throw rugs and other things on the floor that can make you trip. What can I do in the kitchen?  Clean up any spills right away.  Avoid walking on wet floors.  Keep items that you use a lot in easy-to-reach places.  If you need to reach something above you, use a strong step stool that has a grab bar.  Keep electrical cords out of the way.  Do not use floor polish or wax that makes floors slippery. If you must use wax, use non-skid floor wax.  Do not have throw rugs and other things on the floor that can  make you trip. What can I do with my stairs?  Do not leave any items on the stairs.  Make sure that there are handrails on both sides of the stairs and use them. Fix handrails that are broken or loose. Make sure that handrails are as long as the stairways.  Check any carpeting to make sure that it is firmly attached to the stairs. Fix any carpet that is loose or worn.  Avoid having throw rugs at the top or bottom of the stairs. If you do have throw rugs, attach them to the floor with carpet tape.  Make sure that you have a light switch at the top of the stairs and the bottom of the stairs. If you do not have them, ask someone to add them for you. What else can I do to help prevent falls?  Wear shoes that:  Do not have high heels.  Have rubber bottoms.  Are comfortable and fit you well.  Are closed at the toe. Do not wear sandals.  If  you use a stepladder:  Make sure that it is fully opened. Do not climb a closed stepladder.  Make sure that both sides of the stepladder are locked into place.  Ask someone to hold it for you, if possible.  Clearly mark and make sure that you can see:  Any grab bars or handrails.  First and last steps.  Where the edge of each step is.  Use tools that help you move around (mobility aids) if they are needed. These include:  Canes.  Walkers.  Scooters.  Crutches.  Turn on the lights when you go into a dark area. Replace any light bulbs as soon as they burn out.  Set up your furniture so you have a clear path. Avoid moving your furniture around.  If any of your floors are uneven, fix them.  If there are any pets around you, be aware of where they are.  Review your medicines with your doctor. Some medicines can make you feel dizzy. This can increase your chance of falling. Ask your doctor what other things that you can do to help prevent falls. This information is not intended to replace advice given to you by your health care provider. Make sure you discuss any questions you have with your health care provider. Document Released: 07/15/2009 Document Revised: 02/24/2016 Document Reviewed: 10/23/2014 Elsevier Interactive Patient Education  2017 [employer].

## 2020-04-23 NOTE — Progress Notes (Signed)
I connected with Tracy Espinoza today by telephone and verified that I am speaking with the correct person using two identifiers. Location patient: home Location provider: work Persons participating in the virtual visit: Ashauna, Bertholf LPN.   I discussed the limitations, risks, security and privacy concerns of performing an evaluation and management service by telephone and the availability of in person appointments. I also discussed with the patient that there may be a patient responsible charge related to this service. The patient expressed understanding and verbally consented to this telephonic visit.    Interactive audio and video telecommunications were attempted between this provider and patient, however failed, due to patient having technical difficulties OR patient did not have access to video capability.  We continued and completed visit with audio only.  Vital signs may be patient reported or missing.     Subjective:   Tracy Espinoza is a 74 y.o. female who presents for Medicare Annual (Subsequent) preventive examination.  Review of Systems     Cardiac Risk Factors include: advanced age (>26men, >72 women);hypertension;obesity (BMI >30kg/m2);sedentary lifestyle     Objective:    Today's Vitals   04/23/20 1256 04/23/20 1257  Weight: (!) 274 lb (124.3 kg)   Height: 5\' 1"  (1.549 m)   PainSc:  5    Body mass index is 51.77 kg/m.  Advanced Directives 04/23/2020 03/26/2019 03/28/2018 04/25/2017 02/28/2017 08/22/2016 08/19/2016  Does Patient Have a Medical Advance Directive? Yes Yes Yes No Yes - Yes  Type of Paramedic of [place];Living will Living will;Healthcare Power of Attorney Living will;Healthcare Power of Attorney - Living will;Healthcare Power of Attorney Living will [place];Living will  Does patient want to make changes to medical advance directive? - - No - Patient declined - - No - Patient declined -  Copy of  [place] in Chart? No - copy requested No - copy requested No - copy requested - No - copy requested - -  Would patient like information on creating a medical advance directive? - - - No - Patient declined - - -    Current Medications (verified) Outpatient Encounter Medications as of 04/23/2020  Medication Sig  . acetaminophen (ACETAMINOPHEN 8 HOUR) 650 MG CR tablet Take 650 mg by mouth every 8 (eight) hours as needed for pain.   . Ascorbic Acid (VITAMIN C) 1000 MG tablet Take 1,000 mg by mouth daily.  Marland Kitchen aspirin EC 81 MG tablet Take 81 mg by mouth daily.  . Calcium Carb-Cholecalciferol (CALCIUM 600+D) 600-800 MG-UNIT TABS Take 1 tablet by mouth daily.   . Cranberry 425 MG CAPS Take by mouth.  . doxycycline (VIBRAMYCIN) 100 MG capsule Take 1 capsule (100 mg total) by mouth at bedtime.  . gabapentin (NEURONTIN) 400 MG capsule Take up to 4 capsules daily as needed  . hydrochlorothiazide (HYDRODIURIL) 25 MG tablet Take 1 tablet (25 mg total) by mouth daily.  Marland Kitchen LORazepam (ATIVAN) 0.5 MG tablet TAKE 1 TABLET BY MOUTH  DAILY . TAKE ONLY FOR BAD  DAYS.  Marland Kitchen losartan (COZAAR) 100 MG tablet Take 1 tablet (100 mg total) by mouth daily.  . metoprolol succinate (TOPROL-XL) 50 MG 24 hr tablet Take 1 tablet (50 mg total) by mouth daily.  . Multiple Vitamin (MULTIVITAMIN) tablet Take 1 tablet by mouth daily.  Marland Kitchen nystatin (MYCOSTATIN/NYSTOP) powder Apply 1 application topically 2 (two) times daily.  Marland Kitchen nystatin cream (MYCOSTATIN) Apply 1 application topically 2 (two) times daily.  . Omega-3  Fatty Acids (OMEGA-3 FISH OIL PO) Take 2 capsules by mouth daily.   . Probiotic Product ([employer] DAILY PROBIOTIC PO) Take 1 capsule by mouth daily.   . psyllium (METAMUCIL) 28 % packet Take 1 packet by mouth daily.  . sertraline (ZOLOFT) 50 MG tablet Take 1 tablet (50 mg total) by mouth daily.  . traZODone (DESYREL) 100 MG tablet TAKE 1 TABLET BY MOUTH AT  BEDTIME  . loperamide (IMODIUM) 2 MG capsule Take 1  capsule daily for 7 days and then stop. (Patient not taking: Reported on 04/23/2020)  . traMADol (ULTRAM) 50 MG tablet Take 1 tablet (50 mg total) by mouth 2 (two) times daily as needed. (Patient not taking: Reported on 04/23/2020)   No facility-administered encounter medications on file as of 04/23/2020.    Allergies (verified) Tape, Daptomycin, Band-aid plus antibiotic [bacitracin-polymyxin b], Iodinated diagnostic agents, Morphine sulfate, and Vancomycin   History: Past Medical History:  Diagnosis Date  . Anxiety   . Arthritis   . Cancer ([place])    BASAL CELL SKIN CANCERS REMOVED  . Depression   . Edema    FEET/LEGS  . GERD (gastroesophageal reflux disease)   . Heart murmur   . History of hiatal hernia   . Hypertension   . Lymphedema   . MRSA (methicillin resistant staph aureus) culture positive    H/O   Past Surgical History:  Procedure Laterality Date  . ABDOMINAL HYSTERECTOMY    . APPENDECTOMY    . BACK SURGERY    . CATARACT EXTRACTION W/PHACO Left 03/28/2018   Procedure: CATARACT EXTRACTION PHACO AND INTRAOCULAR LENS PLACEMENT (IOC);  Surgeon: Eulogio Bear, MD;  Location: [facility] ORS;  Service: Ophthalmology;  Laterality: Left;  [place] 00:28.2 AP% 7.9 CDE 2.21 Fluid pack lot # [id number]  . CATARACT EXTRACTION W/PHACO Right 05/16/2018   Procedure: CATARACT EXTRACTION PHACO AND INTRAOCULAR LENS PLACEMENT (IOC);  Surgeon: Eulogio Bear, MD;  Location: [facility] OR[facility];  Service: Ophthalmology;  Laterality: Right;  [place] 00:26.2 AP% 7.6 CDE 2.01 Fluid pack lot # [id number]  . CHOLECYSTECTOMY    . COLONOSCOPY    . FRACTURE SURGERY     BONE ON SIDE OF RIGHT FOOT  . HERNIA REPAIR     UMBILICAL X 2  . JOINT REPLACEMENT Bilateral    knees  . KIDNEY SURGERY    . LUMBAR WOUND DEBRIDEMENT N/A 08/03/2016   Procedure: IRRIGATION AND DEBRIDEMENT LUMBAR WOUND;  Surgeon: Eustace Moore, MD;  Location: [place];  Service: Neurosurgery;  Laterality: N/A;  . OVARIAN CYST SURGERY    . REPAIR OF  LEFT URETER Left 40 YRS AGO  . ROTATOR CUFF REPAIR  2014  . TONSILLECTOMY    . TUBAL LIGATION     Family History  Problem Relation Age of Onset  . Hypertension Mother   . Stroke Mother   . Alzheimer's disease Mother   . Cancer Father        brain  . Hypertension Father   . Stroke Father    Social History   Socioeconomic History  . Marital status: Widowed    Spouse name: Not on file  . Number of children: Not on file  . Years of education: 71   . Highest education level: Some college, no degree  Occupational History  . Occupation: retired  Tobacco Use  . Smoking status: Never Smoker  . Smokeless tobacco: Never Used  Vaping Use  . Vaping Use: Never used  Substance and Sexual Activity  . Alcohol  use: Yes    Alcohol/week: 5.0 standard drinks    Types: 5 Glasses of wine per week    Comment: white wine occasionally 1 glass a day   . Drug use: No  . Sexual activity: Not Currently  Other Topics Concern  . Not on file  Social History Narrative  . Not on file   Social Determinants of Health   Financial Resource Strain: Low Risk   . Difficulty of Paying Living Expenses: Not hard at all  Food Insecurity: No Food Insecurity  . Worried About Charity fundraiser in the Last Year: Never true  . Ran Out of Food in the Last Year: Never true  Transportation Needs: No Transportation Needs  . Lack of Transportation (Medical): No  . Lack of Transportation (Non-Medical): No  Physical Activity: Inactive  . Days of Exercise per Week: 0 days  . Minutes of Exercise per Session: 0 min  Stress: No Stress Concern Present  . Feeling of Stress : Not at all  Social Connections:   . Frequency of Communication with Friends and Family:   . Frequency of Social Gatherings with Friends and Family:   . Attends Religious Services:   . Active Member of Clubs or Organizations:   . Attends Archivist Meetings:   Marland Kitchen Marital Status:     Tobacco Counseling Counseling given: Not  Answered   Clinical Intake:  Pre-visit preparation completed: Yes  Pain : 0-10 Pain Score: 5  Pain Type: Chronic pain Pain Location: Back Pain Orientation: Lower Pain Descriptors / Indicators: Burning Pain Onset: More than a month ago Pain Frequency: Intermittent Pain Relieving Factors: sitting down  Pain Relieving Factors: sitting down  Nutritional Status: BMI > 30  Obese Nutritional Risks: Nausea/ vomitting/ diarrhea (chronic diarrhea, being treated) Diabetes: No  How often do you need to have someone help you when you read instructions, pamphlets, or other written materials from your doctor or pharmacy?: 1 - Never What is the last grade level you completed in school?: yr college  Diabetic? no  Interpreter Needed?: No  Information entered by :: NAllen LPN   Activities of Daily Living In your present state of health, do you have any difficulty performing the following activities: 04/23/2020  Hearing? N  Vision? N  Difficulty concentrating or making decisions? N  Walking or climbing stairs? N  Dressing or bathing? N  Doing errands, shopping? N  Preparing Food and eating ? N  Using the Toilet? N  In the past six months, have you accidently leaked urine? Y  Comment has incontinence  Do you have problems with loss of bowel control? Y  Comment due to diarrhea  Managing your Medications? N  Managing your Finances? N  Housekeeping or managing your Housekeeping? N  Some recent data might be hidden    Patient Care Team: Guadalupe Maple, MD as PCP - General (Family Medicine) Lucilla Lame, MD as Consulting Physician (Gastroenterology) Delana Meyer, Dolores Lory, MD (Vascular Surgery) Thornton Park, MD as Referring Physician (Orthopedic Surgery) Yolonda Kida, MD as Consulting Physician (Cardiology) Kristeen Miss, MD as Consulting Physician (Neurosurgery) Leonel Ramsay, MD (Infectious Diseases)  Indicate any recent Medical Services you may have received from  other than [facility] providers in the past year (date may be approximate).     Assessment:   This is a routine wellness examination for [facility]  Hearing/Vision screen  Hearing Screening   125Hz  250Hz  500Hz  1000Hz  2000Hz  3000Hz  4000Hz  6000Hz  8000Hz   Right ear:  Left ear:           Vision Screening Comments: No regular eye exams, [facility]  Dietary issues and exercise activities discussed: Current Exercise Habits: The patient does not participate in regular exercise at present  Goals    . DIET - INCREASE WATER INTAKE     Recommend drinking at least 6-8 glasses of water a day     . Increase water intake     Recommend drinking 3-4 glasses of water a day.    . Patient Stated     04/23/2020, working on balance and getting diarrhea under control      Depression Screen PHQ 2/9 Scores 04/23/2020 03/17/2020 11/05/2019 03/26/2019 10/24/2018 03/13/2018 09/17/2017  PHQ - 2 Score 0 0 3 3 0 0 0  PHQ- 9 Score - 2 6 7 4  - -    Fall Risk Fall Risk  04/23/2020 11/05/2019 08/27/2019 08/14/2019 03/26/2019  Falls in the past year? 0 1 1 0 0  Comment - - Emmi Telephone Survey: data to providers prior to load - -  Number falls in past yr: - 0 1 0 -  Comment - - Emmi Telephone Survey Actual Response = 2 - -  Injury with Fall? - 0 1 0 -  Risk for fall due to : Impaired balance/gait;Medication side effect - - - -  Follow up Falls evaluation completed;Education provided;Falls prevention discussed - - - -    Any stairs in or around the home? No  If so, are there any without handrails? n/a Home free of loose throw rugs in walkways, pet beds, electrical cords, etc? Yes  Adequate lighting in your home to reduce risk of falls? Yes   ASSISTIVE DEVICES UTILIZED TO PREVENT FALLS:  Life alert? Yes  Use of a cane, walker or w/c? Yes  Grab bars in the bathroom? Yes  Shower chair or bench in shower? Yes  Elevated toilet seat or a handicapped toilet? Yes   TIMED UP AND GO:  Was the test  performed? No .     Cognitive Function:     6CIT Screen 04/23/2020 03/13/2018 02/28/2017  What Year? 0 points 0 points 0 points  What month? 0 points 0 points 0 points  What time? 0 points 0 points 0 points  Count back from 20 0 points 0 points 0 points  Months in reverse 0 points 0 points 0 points  Repeat phrase 0 points 0 points 2 points  Total Score 0 0 2    Immunizations Immunization History  Administered Date(s) Administered  . Fluad Quad(high Dose 65+) 06/17/2019  . Influenza, High Dose Seasonal PF 06/02/2017, 06/11/2018  . Influenza-Unspecified 06/08/2016  . PFIZER SARS-COV-2 Vaccination 10/09/2019, 10/30/2019  . Pneumococcal Conjugate-13 08/04/2014  . Pneumococcal-Unspecified 11/13/2007, 08/25/2013  . Td 08/04/2014  . Zoster 11/13/2007  . Zoster Recombinat (Shingrix) 11/02/2017, 03/18/2018    TDAP status: Up to date Flu Vaccine status: Up to date Pneumococcal vaccine status: Up to date Covid-19 vaccine status: Completed vaccines  Qualifies for Shingles Vaccine? Yes   Zostavax completed Yes   Shingrix Completed?: Yes  Screening Tests Health Maintenance  Topic Date Due  . INFLUENZA VACCINE  05/02/2020  . COLONOSCOPY  06/16/2024  . TETANUS/TDAP  08/04/2024  . DEXA SCAN  Completed  . COVID-19 Vaccine  Completed  . Hepatitis C Screening  Completed  . PNA vac Low Risk Adult  Completed    Health Maintenance  There are no preventive care reminders to display for  this patient.  Colorectal cancer screening: Completed 06/16/2014. Repeat every 10 years Mammogram status: Completed 07/14/2019. Repeat every year Bone Density status: Completed 06/16/2014.   Lung Cancer Screening: (Low Dose CT Chest recommended if Age 26-80 years, 30 pack-year currently smoking OR have quit w/in 15years.) does not qualify.   Lung Cancer Screening Referral: no  Additional Screening:  Hepatitis C Screening: does qualify; Completed 03/13/2017  Vision Screening: Recommended annual  ophthalmology exams for early detection of glaucoma and other disorders of the eye. Is the patient up to date with their annual eye exam?  No  Who is the provider or what is the name of the office in which the patient attends annual eye exams? [facility] If pt is not established with a provider, would they like to be referred to a provider to establish care? No .   Dental Screening: Recommended annual dental exams for proper oral hygiene  Community Resource Referral / Chronic Care Management: CRR required this visit?  No   CCM required this visit?  No      Plan:     I have personally reviewed and noted the following in the patient's chart:   . Medical and social history . Use of alcohol, tobacco or illicit drugs  . Current medications and supplements . Functional ability and status . Nutritional status . Physical activity . Advanced directives . List of other physicians . Hospitalizations, surgeries, and ER visits in previous 12 months . Vitals . Screenings to include cognitive, depression, and falls . Referrals and appointments  In addition, I have reviewed and discussed with patient certain preventive protocols, quality metrics, and best practice recommendations. A written personalized care plan for preventive services as well as general preventive health recommendations were provided to patient.     Kellie Simmering, LPN   5/32/0233   Nurse Notes: Patient would like a referral to the pain clinic again. She recommends that they call her home phone. Cell phone does not take messages.

## 2020-04-26 ENCOUNTER — Ambulatory Visit: Payer: Medicare Other

## 2020-04-26 ENCOUNTER — Other Ambulatory Visit: Payer: Self-pay

## 2020-04-26 DIAGNOSIS — M62838 Other muscle spasm: Secondary | ICD-10-CM | POA: Diagnosis not present

## 2020-04-26 DIAGNOSIS — R278 Other lack of coordination: Secondary | ICD-10-CM

## 2020-04-26 DIAGNOSIS — M4125 Other idiopathic scoliosis, thoracolumbar region: Secondary | ICD-10-CM | POA: Diagnosis not present

## 2020-04-26 DIAGNOSIS — M533 Sacrococcygeal disorders, not elsewhere classified: Secondary | ICD-10-CM

## 2020-04-26 NOTE — Patient Instructions (Signed)
    If you get to where you are not feeling this stretch try bringing a couple books under each foot to flex the hip more.

## 2020-04-26 NOTE — Therapy (Signed)
[place] MAIN [facility] SERVICES [address] [place], [place], [address] Phone: [phone]   Fax:  [phone]  Physical Therapy Treatment  The patient has been informed of current processes in place at Outpatient Rehab to protect patients from Covid-19 exposure including social distancing, schedule modifications, and new cleaning procedures. After discussing their particular risk with a therapist based on the patient's personal risk factors, the patient has decided to proceed with in-person therapy.  Patient Details  Name: Tracy Espinoza MRN: [id number] Date of Birth: 06/25/1946 No data recorded  Encounter Date: 04/26/2020   PT End of Session - 04/26/20 1504    Visit Number 19    Number of Visits 35    Date for PT Re-Evaluation 06/23/20    Authorization Type Medicare    Authorization Time Period 5/5 through 7/14    Authorization - Visit Number 4    Authorization - Number of Visits 20    Progress Note Due on Visit 20    PT Start Time 1300    PT Stop Time 1400    PT Time Calculation (min) 60 min    Activity Tolerance Patient tolerated treatment well;No increased pain    Behavior During Therapy WFL for tasks assessed/performed           Past Medical History:  Diagnosis Date  . Anxiety   . Arthritis   . Cancer ([place])    BASAL CELL SKIN CANCERS REMOVED  . Depression   . Edema    FEET/LEGS  . GERD (gastroesophageal reflux disease)   . Heart murmur   . History of hiatal hernia   . Hypertension   . Lymphedema   . MRSA (methicillin resistant staph aureus) culture positive    H/O    Past Surgical History:  Procedure Laterality Date  . ABDOMINAL HYSTERECTOMY    . APPENDECTOMY    . BACK SURGERY    . CATARACT EXTRACTION W/PHACO Left 03/28/2018   Procedure: CATARACT EXTRACTION PHACO AND INTRAOCULAR LENS PLACEMENT (IOC);  Surgeon: Eulogio Bear, MD;  Location: [facility] ORS;  Service: Ophthalmology;  Laterality: Left;  [place] 00:28.2 AP%  7.9 CDE 2.21 Fluid pack lot # [id number]  . CATARACT EXTRACTION W/PHACO Right 05/16/2018   Procedure: CATARACT EXTRACTION PHACO AND INTRAOCULAR LENS PLACEMENT (IOC);  Surgeon: Eulogio Bear, MD;  Location: [facility] ORS;  Service: Ophthalmology;  Laterality: Right;  [place] 00:26.2 AP% 7.6 CDE 2.01 Fluid pack lot # [id number]  . CHOLECYSTECTOMY    . COLONOSCOPY    . FRACTURE SURGERY     BONE ON SIDE OF RIGHT FOOT  . HERNIA REPAIR     UMBILICAL X 2  . JOINT REPLACEMENT Bilateral    knees  . KIDNEY SURGERY    . LUMBAR WOUND DEBRIDEMENT N/A 08/03/2016   Procedure: IRRIGATION AND DEBRIDEMENT LUMBAR WOUND;  Surgeon: Eustace Moore, MD;  Location: [facility];  Service: Neurosurgery;  Laterality: N/A;  . OVARIAN CYST SURGERY    . REPAIR OF LEFT URETER Left 40 YRS AGO  . ROTATOR CUFF REPAIR  2014  . TONSILLECTOMY    . TUBAL LIGATION      There were no vitals filed for this visit.   Pelvic Floor Physical Therapy Treatment Note  SCREENING  Changes in medications, allergies, or medical history?: none    SUBJECTIVE  Patient reports: Has noticed that she is waking up in the middle of the night to go pee, which is new. Noticing less wetness in the  morning now. Some days none at all. Some days are better than others with FI, changed 3 times yesterday. Occasionally having more formed BM's less Jelly like sometimes still thinner than jelly and green. Her R hip is still hurting but less often, not all the time. Mostly when she has been up/walking or standing. Neck is ok right this moment but has been "hateful". Is trying to walk her hall (~ 30 ft) down and back 2-3 times per day without using her RW at home each day. Is doing kegels "periodically throughout the day" and always can tell that the first one is "too much " but she is able to correct.   Precautions:  Abdominal hysterectomy, Lymphedema (diagnosed ~ 3 years ago) (needs to be wearing pressure boots) L4-5 fusion in 2017.  Location of pain: LB/hip  (neck/shoulder) Current pain: 0/10  (0/10) Max pain: 4/10 (6/10) Least pain: 0/10 (0/10) Nature of pain:"grinding" sore/achy  **no increased pain following session.  Patient Goals: Would like to get a handle on her diarrhea so it is not so liquid and she can control it better. Would like to be able to go out to shop or go to church without fear of FI. Not have to change clothes more than 1 time per day due to leakage.  OBJECTIVE  Changes in: Posture/Observations:  Hyperlordosis/kyphosis, FHP, prominent C7, and R thoracic scoliosis. (from prior note)   Range of Motion/Flexibilty:   Strength/MMT:  4+ for knee EXT and flex, 4/5 for hip flex, DF, hip EXT R hip IR ~ 4/5 (from prior visit)  Pelvic Floor External Exam: From previous session) Introitus Appears: mildly gaping Skin integrity: vaginal warts and reddened, moist skin. Palpation: TTP through B STP Cough: not assessed Prolapse visible?: no Scar mobility: decreased at 5 and 7 o'clock  Internal Vaginal Exam: Strength (PERF): 3/5, 10 sec, 1 time (~ 4/5 following release) Symmetry: similar B Palpation: TTP to all PFM internally with scar restriction at B posterior fourchette. Prolapse: mild posterior wall with bearing down, ~ 1 cm above introitus.  Today: Pt. Demonstrated spasms though B anterior PR/PC and L OI. Had some difficulty recruiting/coordinating PFM for kegels due to her back being arched in hook-lying position.   Internal Rectal Exam: Deferred indefinately Strength (PERF): Symmetry: Palpation: Prolapse:   Abdominal:  10 finger diastasis, narrows to 8 with exhale/posterior pelvic tilt. Able to consistently do pelvic tilt with min to no cueing (from prior session)  Palpation: TTP to R lumbar multifidus, Piriformis, OI and Glute Max and Med.  Gait Analysis: Slow cadence but no evident trendelenburg and Pt. Did require one que to look up but was able to walk along L wall of the hall without  issue.  INTERVENTIONS THIS SESSION:  Therex: Reviewed seated pelvic tilts and reminded Pt. To make them a priority to help maintain decreased LB and hip pain as well as to improve stability around the pelvis to allow the PFM to relax and function better.  Self-care: reviewed Prebiotic rich foods and encouraged Pt. To try to incorporate more of them and try to minimize "prebiotic killers" to improve gut health as much as possible. Encouraged Pt. To continue increasing her physical activity for this and other general health reasons.  Manual: Performed TP release to TTP to R lumbar multifidus, Piriformis, OI and Glute Max and Med. to decrease spasm and pain and allow for improved balance of musculature for improved function, and decreased symptoms.  Total time: 60  PT Short Term Goals - 04/12/20 1424      PT SHORT TERM GOAL #1   Title Patient will demonstrate functional recruitment of TA with breathing, sit-to-stand, squatting/lifting, and walking to allow for improved pelvic brace coordination, improved balance, and decreased downward pressure on the pelvic organs.    Baseline Pt. demonstrates dysfunctional breathing and poor TA recruitment/awareness.    Time 5    Period Weeks    Status Achieved    Target Date 03/10/20      PT SHORT TERM GOAL #2   Title Patient will demonstrate improved pelvic alignment and balance of musculature surrounding the pelvis to facilitate decreased PFM spasms and decrease pelvic pain.    Baseline R anterior/L posterior pelvic obliquity and R thoracic scoliotic curve. As of 6/23: is ~ 50% better. and is feeling like she has better balance.    Time 10    Period Weeks    Status Achieved    Target Date 04/14/20      PT SHORT TERM GOAL #3   Title Patient will demonstrate HEP x1 in the clinic to demonstrate understanding and proper form to allow for further improvement.    Baseline Pt. lacks knowledge of  which therapeutic exercises can help decrease her pain/Sx.    Time 5    Period Weeks    Status Achieved    Target Date 03/10/20      PT SHORT TERM GOAL #4   Title Pt. will demonstrate implementation of behavioral modifications such as fluid intake and use of Urge suppression technique to allow for decreased UUI/frequency.    Baseline Pt. having UUI daily, drinking a lot of Mtn. Dew. As of 7/12: is drinking some ~ 8 oz of water, ~ 4-5 cups of crystal light, ~ 1-2 decaffinated Mtn. Dew. and occasional glass of milk and has noticed decreased Urinary incontinece.    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    Baseline Pt. having to change clothes 3 times per day and does not eat if leaving the house due to [place]. As of 6/23: Pt. only changing 1-2 times per day but still does not feel like she can eat if she has to leave the house. as of 7/12: No change n frequency.    Time 20    Period Weeks    Status On-going    Target Date 06/23/20      PT LONG TERM GOAL #2   Title Patient will describe pain no greater than 3/10 during standing or walking up to 30 min. to demonstrate improved functional ability and allow for return to church.    Baseline pain increases to 6/10 (enough to warrant resting) after 15 min. of standing or walking. As of 6/23: pain 4/10 at greatest with ADL's but has not walked for exercise yet. As of 7/12: has not had pain > 4/10 without brace and able to walk with brace into PT session withot pain.    Time 10    Period Weeks    Status  On-going    Target Date 06/21/20      PT LONG TERM GOAL #3   Title Pt. will improve in FOTO score by 15 points in  each category to demonstrate improved function.    Baseline FOTO PFDI Prolapse: 54 PFDI Bowel: 63, Urinary Problem: 44, Bowel Leakage 46, PFDI Urinary: 54, PFDI Pain 46. As of 7/13: FOTO PFDI Prolapse:21 PFDI Bowel: 33, Urinary Problem: 39, Bowel Leakage 54, PFDI Urinary: 42, PFDI Pain 0.    Time 20    Period Weeks    Status On-going    Target Date 06/23/20                 Plan - 04/26/20 1504    Clinical Impression Statement Pt. Responded well to all interventions today, demonstrating continued ability to coordinate pelvic tilt with exhale, decreased spasm and pain, as well as understanding and correct performance of all education and exercises provided today. They will continue to benefit from skilled physical therapy to work toward remaining goals and maximize function as well as decrease likelihood of symptom increase or recurrence.    PT Next Visit Plan Ask about hip pain and success with using Kegel for urge-suppression of bowel and bladder. Review hip ABD/EXT and hip-flexor stretch and give as HEP, further thoracic mobility, review hip-flexor stretch, side-stretch, LB stretch, standing pelvic tilts, weight shifts and add kegels to urge-suppression if appropriate. Assess internally when appropriate    PT Home Exercise Plan soda-can theory, belly breathing, urge suppression (breathing only), sit-to-stand with exhale, supine-to-sit, pelvic tilt with obliques, PFM and TA, hook-lying pelvic tilts, R hip EXT/heel press, heel-lift for LLE, hip IR with yellow band, scapular retraction, chin-tucks. weight-shift balances, supine chin-tucks, hip-flexor stretch, low back stretch, side-stretch, standing hip EXT/ABD, Kegels with ~ 5-10 sec. hold, modified child's pose    Consulted and Agree with Plan of Care Patient           Patient will benefit from skilled therapeutic intervention in order to improve the following deficits and impairments:     Visit Diagnosis: Other idiopathic scoliosis,  thoracolumbar region  Other muscle spasm  Sacrococcygeal disorders, not elsewhere classified  Other lack of coordination     Problem List Patient Active Problem List   Diagnosis Date Noted  . Restless legs 10/24/2018  . 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 GERD (gastroesophageal reflux disease) 03/20/2018  . Lymphedema 12/25/2017  . Insomnia 12/18/2017  . Lung mass 03/13/2017  . Coin lesion 03/13/2017  . Advanced care planning/counseling discussion 03/13/2017  . Back pain at L4-L5 level 01/01/2017  . Multiple pulmonary nodules 01/01/2017  . Incidental pulmonary nodule, greater than or equal to 67mm 01/01/2017  . Bilateral leg edema 09/19/2016  . Normocytic normochromic anemia 08/03/2016  . Spondylolisthesis at L4-L5 level 07/10/2016  . Spinal stenosis of lumbar region with radiculopathy 04/27/2016  . Anxiety 04/27/2016  . Trochanteric bursitis of right hip 10/08/2015  . Hypertension 03/29/2015  . Hypercholesteremia 03/29/2015  . BMI 45.0-49.9, adult ([place]) 03/29/2015  . Arthralgia of hands, bilateral 03/29/2015   Willa Rough DPT, ATC Willa Rough 04/26/2020, 3:06 PM  [place] MAIN [facility] SERVICES [address] [place], [place], [address] Phone: [phone]   Fax:  [phone]  Name: Tracy Espinoza MRN: [id number] Date of Birth: 06-Mar-1946

## 2020-04-28 ENCOUNTER — Ambulatory Visit: Payer: Medicare Other

## 2020-04-28 ENCOUNTER — Other Ambulatory Visit: Payer: Self-pay

## 2020-04-28 DIAGNOSIS — M62838 Other muscle spasm: Secondary | ICD-10-CM

## 2020-04-28 DIAGNOSIS — M4125 Other idiopathic scoliosis, thoracolumbar region: Secondary | ICD-10-CM

## 2020-04-28 DIAGNOSIS — R278 Other lack of coordination: Secondary | ICD-10-CM | POA: Diagnosis not present

## 2020-04-28 DIAGNOSIS — M533 Sacrococcygeal disorders, not elsewhere classified: Secondary | ICD-10-CM | POA: Diagnosis not present

## 2020-04-28 NOTE — Patient Instructions (Addendum)
    To decrease urgency after using your "pressure boots": Do 1 loop around the house/porch (~ 150 ft.)  Empty your bladder Then put on your pressure boots. After you are done but before you stand up, take 2 deep breaths and do 5 kegels. Exhale as you stand up and remain calm as you walk to the bathroom.

## 2020-04-28 NOTE — Therapy (Signed)
[place] MAIN [facility] SERVICES [address] [place], [place], [address] Phone: [phone]   Fax:  [phone]  Physical Therapy Treatment  The patient has been informed of current processes in place at Outpatient Rehab to protect patients from Covid-19 exposure including social distancing, schedule modifications, and new cleaning procedures. After discussing their particular risk with a therapist based on the patient's personal risk factors, the patient has decided to proceed with in-person therapy.  Patient Details  Name: Tracy Espinoza MRN: [id number] Date of Birth: 1945/11/04 No data recorded  Encounter Date: 04/28/2020   PT End of Session - 04/28/20 1430    Visit Number 20    Number of Visits 35    Date for PT Re-Evaluation 06/23/20    Authorization Type Medicare    Authorization Time Period 7/12 through 06/21/20    Authorization - Visit Number 5    Authorization - Number of Visits 20    Progress Note Due on Visit 20    PT Start Time 1330    PT Stop Time 1430    PT Time Calculation (min) 60 min    Activity Tolerance Patient tolerated treatment well;No increased pain    Behavior During Therapy WFL for tasks assessed/performed           Past Medical History:  Diagnosis Date  . Anxiety   . Arthritis   . Cancer ([place])    BASAL CELL SKIN CANCERS REMOVED  . Depression   . Edema    FEET/LEGS  . GERD (gastroesophageal reflux disease)   . Heart murmur   . History of hiatal hernia   . Hypertension   . Lymphedema   . MRSA (methicillin resistant staph aureus) culture positive    H/O    Past Surgical History:  Procedure Laterality Date  . ABDOMINAL HYSTERECTOMY    . APPENDECTOMY    . BACK SURGERY    . CATARACT EXTRACTION W/PHACO Left 03/28/2018   Procedure: CATARACT EXTRACTION PHACO AND INTRAOCULAR LENS PLACEMENT (IOC);  Surgeon: Eulogio Bear, MD;  Location: [facility] ORS;  Service: Ophthalmology;  Laterality: Left;  [place] 00:28.2 AP%  7.9 CDE 2.21 Fluid pack lot # [id number]  . CATARACT EXTRACTION W/PHACO Right 05/16/2018   Procedure: CATARACT EXTRACTION PHACO AND INTRAOCULAR LENS PLACEMENT (IOC);  Surgeon: Eulogio Bear, MD;  Location: [facility] ORS;  Service: Ophthalmology;  Laterality: Right;  [place] 00:26.2 AP% 7.6 CDE 2.01 Fluid pack lot # [id number]  . CHOLECYSTECTOMY    . COLONOSCOPY    . FRACTURE SURGERY     BONE ON SIDE OF RIGHT FOOT  . HERNIA REPAIR     UMBILICAL X 2  . JOINT REPLACEMENT Bilateral    knees  . KIDNEY SURGERY    . LUMBAR WOUND DEBRIDEMENT N/A 08/03/2016   Procedure: IRRIGATION AND DEBRIDEMENT LUMBAR WOUND;  Surgeon: Eustace Moore, MD;  Location: [facility];  Service: Neurosurgery;  Laterality: N/A;  . OVARIAN CYST SURGERY    . REPAIR OF LEFT URETER Left 40 YRS AGO  . ROTATOR CUFF REPAIR  2014  . TONSILLECTOMY    . TUBAL LIGATION      There were no vitals filed for this visit.   Pelvic Floor Physical Therapy Treatment Note  SCREENING  Changes in medications, allergies, or medical history?: none    SUBJECTIVE  Patient reports: She is still getting up in the middle of the night to go to the bathroom and not having wet underwear when she wakes  up. Ate a small Cesar salad and a flatbread pizza at [employer] with no diarrhea. Had a BM this morning that was "borderline diarrhea" but was able to get to the bathroom in time. Did have an accident on Monday where she had the urge to go and was in the bathroom but did not make it in time.  Went a whole day yesterday without changing clothes.   Precautions:  Abdominal hysterectomy, Lymphedema (diagnosed ~ 3 years ago) (needs to be wearing pressure boots) L4-5 fusion in 2017.  Location of pain: LB/hip (neck/shoulder) Current pain: 5/10  (0/10) Max pain: 5/10 (/10) Least pain: 0/10 (0/10) Nature of pain:"grinding" sore/achy  **no pain following session.  Patient Goals: Would like to get a handle on her diarrhea so it is not so liquid and she can  control it better. Would like to be able to go out to shop or go to church without fear of FI. Not have to change clothes more than 1 time per day due to leakage.  OBJECTIVE  Changes in: Posture/Observations:  Hyperlordosis/kyphosis, FHP, prominent C7, and R thoracic scoliosis. (from prior note)   Range of Motion/Flexibilty:   Strength/MMT:  4+ for knee EXT and flex, 4/5 for hip flex, DF, hip EXT R hip IR ~ 4/5 (from prior visit)  Pelvic Floor External Exam: From previous session) Introitus Appears: mildly gaping Skin integrity: vaginal warts and reddened, moist skin. Palpation: TTP through B STP Cough: not assessed Prolapse visible?: no Scar mobility: decreased at 5 and 7 o'clock  Internal Vaginal Exam: Strength (PERF): 3/5, 10 sec, 1 time (~ 4/5 following release) Symmetry: similar B Palpation: TTP to all PFM internally with scar restriction at B posterior fourchette. Prolapse: mild posterior wall with bearing down, ~ 1 cm above introitus.  Today: Pt. Demonstrated spasms though B anterior PR/PC and L OI. Had some difficulty recruiting/coordinating PFM for kegels due to her back being arched in hook-lying position.   Internal Rectal Exam: Deferred indefinately Strength (PERF): Symmetry: Palpation: Prolapse:   Abdominal:  10 finger diastasis, narrows to 8 with exhale/posterior pelvic tilt. Able to consistently do pelvic tilt with min to no cueing (from prior session)  Palpation: TTP to R lumbar multifidus, erector spinae ~ L3  Gait Analysis: Slow cadence but no evident trendelenburg and Pt. Did require one que to look up but was able to walk along L wall of the hall without issue.  INTERVENTIONS THIS SESSION:  Self-care: educated Pt. On potential of lymphedema therapy to better manage swelling. Discussed walking more to build on success of her outing yesterday with no episodes. Educated on using Psillium Husk to work with metamucil and increase stool bulk overnight  without increasing  Fluid intake before bed.  Dry-needle: Performed TPDN with a .30x169mm needle and standard approach as described below to decrease spasm and pain and allow for improved balance of musculature for improved function and decreased symptoms.  Manual: Performed TP release to R lumbar multifidus, erector spinae ~ L3  to decrease spasm and pain and allow for improved balance of musculature for improved function, and decreased symptoms.  Total time: 60                     Trigger Point Dry Needling - 04/28/20 0001    Consent Given? Yes    Education Handout Provided No    Muscles Treated Back/Hip Erector spinae;Lumbar multifidi    Dry Needling Comments Right ~L3    Erector spinae Response Twitch  response elicited;Palpable increased muscle length    Lumbar multifidi Response Twitch response elicited;Palpable increased muscle length                  PT Short Term Goals - 04/12/20 1424      PT SHORT TERM GOAL #1   Title Patient will demonstrate functional recruitment of TA with breathing, sit-to-stand, squatting/lifting, and walking to allow for improved pelvic brace coordination, improved balance, and decreased downward pressure on the pelvic organs.    Baseline Pt. demonstrates dysfunctional breathing and poor TA recruitment/awareness.    Time 5    Period Weeks    Status Achieved    Target Date 03/10/20      PT SHORT TERM GOAL #2   Title Patient will demonstrate improved pelvic alignment and balance of musculature surrounding the pelvis to facilitate decreased PFM spasms and decrease pelvic pain.    Baseline R anterior/L posterior pelvic obliquity and R thoracic scoliotic curve. As of 6/23: is ~ 50% better. and is feeling like she has better balance.    Time 10    Period Weeks    Status Achieved    Target Date 04/14/20      PT SHORT TERM GOAL #3   Title Patient will demonstrate HEP x1 in the clinic to demonstrate understanding and proper form  to allow for further improvement.    Baseline Pt. lacks knowledge of which therapeutic exercises can help decrease her pain/Sx.    Time 5    Period Weeks    Status Achieved    Target Date 03/10/20      PT SHORT TERM GOAL #4   Title Pt. will demonstrate implementation of behavioral modifications such as fluid intake and use of Urge suppression technique to allow for decreased UUI/frequency.    Baseline Pt. having UUI daily, drinking a lot of Mtn. Dew. As of 7/12: is drinking some ~ 8 oz of water, ~ 4-5 cups of crystal light, ~ 1-2 decaffinated Mtn. Dew. and occasional glass of milk and has noticed decreased Urinary incontinece.    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    Baseline Pt. having to change clothes 3 times per day and does not eat if leaving the house due to [place]. As of 6/23: Pt. only changing 1-2 times per day but still does not feel like she can eat if she has to leave the house. as of 7/12: No change n frequency.    Time 20    Period Weeks    Status On-going    Target Date 06/23/20      PT LONG TERM GOAL #2   Title Patient will describe pain no greater than 3/10 during standing or walking up to 30 min. to demonstrate improved functional ability and allow for return to church.    Baseline pain increases to 6/10 (enough to warrant resting) after 15 min. of standing or walking. As of 6/23: pain 4/10 at greatest with ADL's but has not walked for exercise yet. As of  7/12: has not had pain > 4/10 without brace and able to walk with brace into PT session withot pain.    Time 10    Period Weeks    Status On-going    Target Date 06/21/20      PT LONG  TERM GOAL #3   Title Pt. will improve in FOTO score by 15 points in each category to demonstrate improved function.    Baseline FOTO PFDI Prolapse: 54 PFDI Bowel: 63, Urinary Problem: 44, Bowel Leakage 46, PFDI Urinary: 54, PFDI Pain 46. As of 7/13: FOTO PFDI Prolapse:21 PFDI Bowel: 33, Urinary Problem: 39, Bowel Leakage 54, PFDI Urinary: 42, PFDI Pain 0.    Time 20    Period Weeks    Status On-going    Target Date 06/23/20                 Plan - 04/28/20 1431    Clinical Impression Statement Pt. Responded well to all interventions today, demonstrating resolution of pain, improved understanding of how movement can impact her GI system, how Psillium can be ingested in multiple forms if needed to allow for improved bowel consistency, as well as understanding and correct performance of all education and exercises provided today. They will continue to benefit from skilled physical therapy to work toward remaining goals and maximize function as well as decrease likelihood of symptom increase or recurrence.    PT Next Visit Plan Ask about hip pain and success with using Kegel for urge-suppression of bowel and bladder. Further TP release internally on R, Review hip ABD/EXT and hip-flexor stretch and give as HEP, further thoracic mobility, review hip-flexor stretch, side-stretch, LB stretch, standing pelvic tilts, weight shifts and add kegels to urge-suppression if appropriate. Assess internally when appropriate    PT Home Exercise Plan soda-can theory, belly breathing, urge suppression (breathing only), sit-to-stand with exhale, supine-to-sit, pelvic tilt with obliques, PFM and TA, hook-lying pelvic tilts, R hip EXT/heel press, heel-lift for LLE, hip IR with yellow band, scapular retraction, chin-tucks. weight-shift balances, supine chin-tucks, hip-flexor stretch, low back stretch, side-stretch, standing hip EXT/ABD, Kegels with ~ 5-10 sec. hold, modified child's pose, psillium husk at night, walking 3x  per day (150 ft)    Consulted and Agree with Plan of Care Patient           Patient will benefit from skilled therapeutic intervention in order to improve the following deficits and impairments:     Visit Diagnosis: Other idiopathic scoliosis, thoracolumbar region  Other muscle spasm  Sacrococcygeal disorders, not elsewhere classified  Other lack of coordination     Problem List Patient Active Problem List   Diagnosis Date Noted  . Restless legs 10/24/2018  . 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 GERD (gastroesophageal reflux disease) 03/20/2018  . Lymphedema 12/25/2017  . Insomnia 12/18/2017  . Lung mass 03/13/2017  . Coin lesion 03/13/2017  . Advanced care planning/counseling discussion 03/13/2017  . Back pain at L4-L5 level 01/01/2017  . Multiple pulmonary nodules 01/01/2017  . Incidental pulmonary nodule, greater than or equal to 27mm 01/01/2017  . Bilateral leg edema 09/19/2016  . Normocytic normochromic anemia 08/03/2016  . Spondylolisthesis at L4-L5 level 07/10/2016  . Spinal stenosis of lumbar region with radiculopathy 04/27/2016  . Anxiety 04/27/2016  . Trochanteric bursitis of right hip 10/08/2015  . Hypertension 03/29/2015  . Hypercholesteremia 03/29/2015  . BMI 45.0-49.9, adult ([place]) 03/29/2015  . Arthralgia of hands, bilateral 03/29/2015   Tracy Espinoza DPT, ATC Tracy Espinoza  Tracy Espinoza 04/28/2020, 2:32 PM  [place] MAIN [facility] SERVICES [address] [place], [place], [address] Phone: [phone]   Fax:  [phone]  Name: Tracy Espinoza MRN: [id number] Date of Birth: 21-Mar-1946

## 2020-05-03 ENCOUNTER — Other Ambulatory Visit: Payer: Self-pay

## 2020-05-03 ENCOUNTER — Ambulatory Visit: Payer: Medicare Other | Attending: Gastroenterology

## 2020-05-03 DIAGNOSIS — M62838 Other muscle spasm: Secondary | ICD-10-CM | POA: Insufficient documentation

## 2020-05-03 DIAGNOSIS — M533 Sacrococcygeal disorders, not elsewhere classified: Secondary | ICD-10-CM | POA: Diagnosis not present

## 2020-05-03 DIAGNOSIS — M4125 Other idiopathic scoliosis, thoracolumbar region: Secondary | ICD-10-CM | POA: Insufficient documentation

## 2020-05-03 DIAGNOSIS — R278 Other lack of coordination: Secondary | ICD-10-CM | POA: Diagnosis not present

## 2020-05-03 NOTE — Therapy (Signed)
[place] MAIN [facility] SERVICES [address] [place], [place], [address] Phone: [phone]   Fax:  [phone]  Physical Therapy Treatment  The patient has been informed of current processes in place at Outpatient Rehab to protect patients from Covid-19 exposure including social distancing, schedule modifications, and new cleaning procedures. After discussing their particular risk with a therapist based on the patient's personal risk factors, the patient has decided to proceed with in-person therapy.  Patient Details  Name: Tracy Espinoza MRN: [id number] Date of Birth: 1945/12/31 No data recorded  Encounter Date: 05/03/2020   PT End of Session - 05/03/20 1608    Visit Number 21    Number of Visits 35    Date for PT Re-Evaluation 06/23/20    Authorization Type Medicare    Authorization Time Period 7/12 through 06/21/20    Authorization - Visit Number 6    Authorization - Number of Visits 20    Progress Note Due on Visit 20    PT Start Time 1430    PT Stop Time 1530    PT Time Calculation (min) 60 min    Activity Tolerance Patient tolerated treatment well;No increased pain    Behavior During Therapy WFL for tasks assessed/performed           Past Medical History:  Diagnosis Date   Anxiety    Arthritis    Cancer ([place])    BASAL CELL SKIN CANCERS REMOVED   Depression    Edema    FEET/LEGS   GERD (gastroesophageal reflux disease)    Heart murmur    History of hiatal hernia    Hypertension    Lymphedema    MRSA (methicillin resistant staph aureus) culture positive    H/O    Past Surgical History:  Procedure Laterality Date   ABDOMINAL HYSTERECTOMY     APPENDECTOMY     BACK SURGERY     CATARACT EXTRACTION W/PHACO Left 03/28/2018   Procedure: CATARACT EXTRACTION PHACO AND INTRAOCULAR LENS PLACEMENT ([place]);  Surgeon: Eulogio Bear, MD;  Location: [facility] ORS;  Service: Ophthalmology;  Laterality: Left;  [place] 00:28.2 AP%  7.9 CDE 2.21 Fluid pack lot # [id number]   CATARACT EXTRACTION W/PHACO Right 05/16/2018   Procedure: CATARACT EXTRACTION PHACO AND INTRAOCULAR LENS PLACEMENT ([place]);  Surgeon: Eulogio Bear, MD;  Location: [facility] ORS;  Service: Ophthalmology;  Laterality: Right;  [place] 00:26.2 AP% 7.6 CDE 2.01 Fluid pack lot # [id number]   CHOLECYSTECTOMY     COLONOSCOPY     FRACTURE SURGERY     BONE ON SIDE OF RIGHT FOOT   HERNIA REPAIR     UMBILICAL X 2   JOINT REPLACEMENT Bilateral    knees   KIDNEY SURGERY     LUMBAR WOUND DEBRIDEMENT N/A 08/03/2016   Procedure: IRRIGATION AND DEBRIDEMENT LUMBAR WOUND;  Surgeon: Eustace Moore, MD;  Location: [place];  Service: Neurosurgery;  Laterality: N/A;   OVARIAN CYST SURGERY     REPAIR OF LEFT URETER Left 40 YRS AGO   ROTATOR CUFF REPAIR  2014   TONSILLECTOMY     TUBAL LIGATION      There were no vitals filed for this visit.  Pelvic Floor Physical Therapy Treatment Note  SCREENING  Changes in medications, allergies, or medical history?: none    SUBJECTIVE  Patient reports: Had a sweet potato on Friday and it cleared her out. Started using the psyllium husk last night. Urinary issues are not as good as  she would like. One night she took some metamucil one night and that didn't go well, she didn't wake up, happened last night as well. Not getting enough sleep. Sleeping about 5 hours. Takes her Zoloft at 2 am. Has been getting up around 9am.   Precautions:  Abdominal hysterectomy, Lymphedema (diagnosed ~ 3 years ago) (needs to be wearing pressure boots) L4-5 fusion in 2017.  Location of pain: LB/hip (neck/shoulder) Current pain: 0/10  (3/10) Max pain: 5/10 (4/10) Least pain: 0/10 (0/10) Nature of pain:"grinding" sore/achy  **no increased pain following session. Neck pain decreased with chin-tuck and R rotation.  Patient Goals: Would like to get a handle on her diarrhea so it is not so liquid and she can control it better. Would  like to be able to go out to shop or go to church without fear of FI. Not have to change clothes more than 1 time per day due to leakage.  OBJECTIVE  Changes in: Posture/Observations:  Hyperlordosis/kyphosis, FHP, prominent C7, and R thoracic scoliosis. (from prior note)   Range of Motion/Flexibilty:   Strength/MMT:  4+ for knee EXT and flex, 4/5 for hip flex, DF, hip EXT R hip IR ~ 4/5 (from prior visit)  Pelvic Floor External Exam: From previous session) Introitus Appears: mildly gaping Skin integrity: vaginal warts and reddened, moist skin. Palpation: TTP through B STP Cough: not assessed Prolapse visible?: no Scar mobility: decreased at 5 and 7 o'clock  Internal Vaginal Exam: Strength (PERF): 3/5, 10 sec, 1 time (~ 4/5 following release) Symmetry: similar B Palpation: TTP to all PFM internally with scar restriction at B posterior fourchette. Prolapse: mild posterior wall with bearing down, ~ 1 cm above introitus.  Today: Pt. Demonstrated spasms though B anterior PR/PC and L OI. Had some difficulty recruiting/coordinating PFM for kegels due to her back being arched in hook-lying position.   Internal Rectal Exam: Deferred indefinately Strength (PERF): Symmetry: Palpation: Prolapse:   Abdominal:  10 finger diastasis, narrows to 8 with exhale/posterior pelvic tilt. Able to consistently do pelvic tilt with min to no cueing (from prior session)  Palpation:  Gait Analysis: Following treatment, Pt. Taking ~ 50% longer steps without trendelenburg, FHP still present.  INTERVENTIONS THIS SESSION:  Self-care: Reminded Pt. To try walking before she gets into her "pressure boots" and discussed the importance of trying to minimize her lordotic curve in sitting to improve sensory awareness of full bowel/bladder and allow time to get to the restroom. Reviewed potential benefit of the cervical traction device in helping manage neck/shoulder pain long-term and assisted Pt. In  finding it online for purchase. Educated on the importance of sleep for overall health and encouraged Pt. To try moving her bed-time up by 1 hour  At a time to increase sleep time. Educated on how to use mindfulness to improve ability to fall asleep and that having a ligt on in her room as she sleeps can decrease sleep quality.   Therex: reviewed and practiced standing hip ABD and EXT with que to make sure the contralateral hip was straight and set the foot down each time between reps on the L to reset the glute med. Educated on marching in place and taking longer strides with the LLE by lifting the knee higher. Practiced balancing with cue to engage contralateral glute med and hold with support until fatigue felt. (~10 sec. Endurance) to improve balance and stability of the pelvic and off-load the PFM. Educated on and practiced chin-tuck with R cervical rotation to  improve ROM and decrease pain in the neck and shoulder.   Total time: 60                              PT Short Term Goals - 04/12/20 1424      PT SHORT TERM GOAL #1   Title Patient will demonstrate functional recruitment of TA with breathing, sit-to-stand, squatting/lifting, and walking to allow for improved pelvic brace coordination, improved balance, and decreased downward pressure on the pelvic organs.    Baseline Pt. demonstrates dysfunctional breathing and poor TA recruitment/awareness.    Time 5    Period Weeks    Status Achieved    Target Date 03/10/20      PT SHORT TERM GOAL #2   Title Patient will demonstrate improved pelvic alignment and balance of musculature surrounding the pelvis to facilitate decreased PFM spasms and decrease pelvic pain.    Baseline R anterior/L posterior pelvic obliquity and R thoracic scoliotic curve. As of 6/23: is ~ 50% better. and is feeling like she has better balance.    Time 10    Period Weeks    Status Achieved    Target Date 04/14/20      PT SHORT TERM GOAL #3    Title Patient will demonstrate HEP x1 in the clinic to demonstrate understanding and proper form to allow for further improvement.    Baseline Pt. lacks knowledge of which therapeutic exercises can help decrease her pain/Sx.    Time 5    Period Weeks    Status Achieved    Target Date 03/10/20      PT SHORT TERM GOAL #4   Title Pt. will demonstrate implementation of behavioral modifications such as fluid intake and use of Urge suppression technique to allow for decreased UUI/frequency.    Baseline Pt. having UUI daily, drinking a lot of Mtn. Dew. As of 7/12: is drinking some ~ 8 oz of water, ~ 4-5 cups of crystal light, ~ 1-2 decaffinated Mtn. Dew. and occasional glass of milk and has noticed decreased Urinary incontinece.    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    Baseline Pt. having to change clothes 3 times per day and does not eat if leaving the house due to [place]. As of 6/23: Pt. only changing 1-2 times per day but still does not feel like she can eat if she has to leave the house. as of 7/12: No change n frequency.    Time 20    Period Weeks    Status On-going    Target Date 06/23/20      PT LONG TERM GOAL #2   Title Patient will describe pain no greater than 3/10 during standing or walking up to 30 min. to demonstrate improved functional ability and allow for return to church.    Baseline pain increases to 6/10 (enough to warrant resting) after 15 min. of standing or walking. As of 6/23: pain 4/10 at greatest with  ADL's but has not walked for exercise yet. As of 7/12: has not had pain > 4/10 without brace and able to walk with brace into PT session  withot pain.    Time 10    Period Weeks    Status On-going    Target Date 06/21/20      PT LONG TERM GOAL #3   Title Pt. will improve in FOTO score by 15 points in each category to demonstrate improved function.    Baseline FOTO PFDI Prolapse: 54 PFDI Bowel: 63, Urinary Problem: 44, Bowel Leakage 46, PFDI Urinary: 54, PFDI Pain 46. As of 7/13: FOTO PFDI Prolapse:21 PFDI Bowel: 33, Urinary Problem: 39, Bowel Leakage 54, PFDI Urinary: 42, PFDI Pain 0.    Time 20    Period Weeks    Status On-going    Target Date 06/23/20                 Plan - 05/03/20 1609    Clinical Impression Statement Pt. Responded well to all interventions today, demonstrating improved hip strength and stride length, as well as understanding and correct performance of all education and exercises provided today. They will continue to benefit from skilled physical therapy to work toward remaining goals and maximize function as well as decrease likelihood of symptom increase or recurrence.    PT Next Visit Plan Ask about success with using Kegel/urge suppression for urge-suppression of bowel and bladder. Further TP release internally on R, Review hip flex/marching and give as HEP, further thoracic mobility, review hip-flexor stretch, side-stretch, LB stretch, standing pelvic tilts, weight shifts and add kegels to urge-suppression if appropriate. Assess internally when appropriate    PT Home Exercise Plan soda-can theory, belly breathing, urge suppression (breathing only), sit-to-stand with exhale, supine-to-sit, pelvic tilt with obliques, PFM and TA, hook-lying pelvic tilts, R hip EXT/heel press, heel-lift for LLE, hip IR with yellow band, scapular retraction, chin-tucks. weight-shift balances, supine chin-tucks, hip-flexor stretch, low back stretch, side-stretch, standing hip EXT/ABD, Kegels with ~ 5-10 sec. 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[place]) 10/24/2018   GERD (gastroesophageal reflux disease) 03/20/2018   Lymphedema 12/25/2017   Insomnia 12/18/2017   Lung mass 03/13/2017   Coin lesion 03/13/2017   Advanced care planning/counseling discussion 03/13/2017   Back pain at L4-L5 level 01/01/2017   Multiple pulmonary nodules 01/01/2017   Incidental pulmonary nodule, greater than or equal to 31mm 01/01/2017   Bilateral leg edema 09/19/2016   Normocytic normochromic anemia 08/03/2016   Spondylolisthesis at L4-L5 level 07/10/2016   Spinal stenosis of lumbar region with radiculopathy 04/27/2016   Anxiety 04/27/2016   Trochanteric bursitis of right hip 10/08/2015   Hypertension 03/29/2015   Hypercholesteremia 03/29/2015   BMI 45.0-49.9, adult ([place]) 03/29/2015   Arthralgia of hands, bilateral 03/29/2015   Willa Rough DPT, ATC Willa Rough 05/03/2020, 4:12 PM  [place] [facility] [address]  [place], [place], [address] Phone: [phone]   Fax:  [phone]  Name: Tracy Espinoza MRN: [id number] Date of Birth: 09-08-1946

## 2020-05-05 ENCOUNTER — Other Ambulatory Visit: Payer: Self-pay | Admitting: Family Medicine

## 2020-05-05 DIAGNOSIS — F419 Anxiety disorder, unspecified: Secondary | ICD-10-CM

## 2020-05-06 ENCOUNTER — Other Ambulatory Visit: Payer: Self-pay

## 2020-05-06 MED ORDER — DOXYCYCLINE HYCLATE 100 MG PO CAPS: 100.0000 mg | ORAL_CAPSULE | Freq: Every day | ORAL | 0 refills | Status: DC

## 2020-05-06 NOTE — Telephone Encounter (Signed)
Last regular OV was 11/2019

## 2020-05-07 ENCOUNTER — Ambulatory Visit: Payer: Medicare Other

## 2020-05-10 ENCOUNTER — Ambulatory Visit: Payer: Medicare Other

## 2020-05-10 ENCOUNTER — Other Ambulatory Visit: Payer: Self-pay

## 2020-05-10 DIAGNOSIS — M533 Sacrococcygeal disorders, not elsewhere classified: Secondary | ICD-10-CM

## 2020-05-10 DIAGNOSIS — M62838 Other muscle spasm: Secondary | ICD-10-CM | POA: Diagnosis not present

## 2020-05-10 DIAGNOSIS — M4125 Other idiopathic scoliosis, thoracolumbar region: Secondary | ICD-10-CM | POA: Diagnosis not present

## 2020-05-10 DIAGNOSIS — R278 Other lack of coordination: Secondary | ICD-10-CM | POA: Diagnosis not present

## 2020-05-10 NOTE — Patient Instructions (Addendum)
[email or web address]     If your neck is really bothering you: Use your hot-pack for ~ 15 min. Then do ~ 10-15 min of traction with your inflatable device, finally do 3x10 chin-tucks.

## 2020-05-10 NOTE — Therapy (Signed)
[place] [facility] [address] [place], [place], [address] Phone: [phone]   Fax:  [phone]  Physical Therapy Treatment  The patient has been informed of current processes in place at Outpatient Rehab to protect patients from Covid-19 exposure including social distancing, schedule modifications, and new cleaning procedures. After discussing their particular risk with a therapist based on the patient's personal risk factors, the patient has decided to proceed with in-person therapy.   Patient Details  Name: Tracy Espinoza MRN: [id number] Date of Birth: 1946-06-16 No data recorded  Encounter Date: 05/10/2020   PT End of Session - 05/10/20 1729    Visit Number 22    Number of Visits 35    Date for PT Re-Evaluation 06/23/20    Authorization Type Medicare    Authorization Time Period 7/12 through 06/21/20    Authorization - Visit Number 7    Authorization - Number of Visits 20    Progress Note Due on Visit 20    PT Start Time 1440    PT Stop Time 1540    PT Time Calculation (min) 60 min    Activity Tolerance Patient tolerated treatment well;No increased pain    Behavior During Therapy WFL for tasks assessed/performed           Past Medical History:  Diagnosis Date  . Anxiety   . Arthritis   . Cancer ([place])    BASAL CELL SKIN CANCERS REMOVED  . Depression   . Edema    FEET/LEGS  . GERD (gastroesophageal reflux disease)   . Heart murmur   . History of hiatal hernia   . Hypertension   . Lymphedema   . MRSA (methicillin resistant staph aureus) culture positive    H/O    Past Surgical History:  Procedure Laterality Date  . ABDOMINAL HYSTERECTOMY    . APPENDECTOMY    . BACK SURGERY    . CATARACT EXTRACTION W/PHACO Left 03/28/2018   Procedure: CATARACT EXTRACTION PHACO AND INTRAOCULAR LENS PLACEMENT (IOC);  Surgeon: Eulogio Bear, MD;  Location: [facility];  Service: Ophthalmology;  Laterality: Left;  [place]  00:28.2 AP% 7.9 CDE 2.21 Fluid pack lot # [id number]  . CATARACT EXTRACTION W/PHACO Right 05/16/2018   Procedure: CATARACT EXTRACTION PHACO AND INTRAOCULAR LENS PLACEMENT (IOC);  Surgeon: Eulogio Bear, MD;  Location: [facility];  Service: Ophthalmology;  Laterality: Right;  [place] 00:26.2 AP% 7.6 CDE 2.01 Fluid pack lot # [id number]  . CHOLECYSTECTOMY    . COLONOSCOPY    . FRACTURE SURGERY     BONE ON SIDE OF RIGHT FOOT  . HERNIA REPAIR     UMBILICAL X 2  . JOINT REPLACEMENT Bilateral    knees  . KIDNEY SURGERY    . LUMBAR WOUND DEBRIDEMENT N/A 08/03/2016   Procedure: IRRIGATION AND DEBRIDEMENT LUMBAR WOUND;  Surgeon: Eustace Moore, MD;  Location: [facility];  Service: Neurosurgery;  Laterality: N/A;  . OVARIAN CYST SURGERY    . REPAIR OF LEFT URETER Left 40 YRS AGO  . ROTATOR CUFF REPAIR  2014  . TONSILLECTOMY    . TUBAL LIGATION      There were no vitals filed for this visit.   Pelvic Floor Physical Therapy Treatment Note  SCREENING  Changes in medications, allergies, or medical history?: none    SUBJECTIVE  Patient reports: Had diarrhea 4 times yesterday, 2 times the day before. Has a UTI, found out on Friday, still does not have any medication  for it.   Precautions:  Abdominal hysterectomy, Lymphedema (diagnosed ~ 3 years ago) (needs to be wearing pressure boots) L4-5 fusion in 2017. Her cervical traction came in and it has helped.  Location of pain: LB/R hip (neck/shoulder) Current pain: 06/10  (4/10) Max pain: 6/10 (5/10) Least pain: 0/10 (0/10) Nature of pain:"grinding" sore/achy  **no increased pain following session. Neck pain decreased with chin-tuck and R rotation.  Patient Goals: Would like to get a handle on her diarrhea so it is not so liquid and she can control it better. Would like to be able to go out to shop or go to church without fear of FI. Not have to change clothes more than 1 time per day due to leakage.  OBJECTIVE  Changes  in: Posture/Observations:  Hyperlordosis/kyphosis, FHP, prominent C7, and R thoracic scoliosis. (from prior note)  Today: L PSIS appears high in standing  ** following treatment PSIS appear level.  Range of Motion/Flexibilty:   Strength/MMT:  4+ for knee EXT and flex, 4/5 for hip flex, DF, hip EXT R hip IR ~ 4/5 (from prior visit)  Pelvic Floor External Exam: From previous session) Introitus Appears: mildly gaping Skin integrity: vaginal warts and reddened, moist skin. Palpation: TTP through B STP Cough: not assessed Prolapse visible?: no Scar mobility: decreased at 5 and 7 o'clock  Internal Vaginal Exam: Strength (PERF): 3/5, 10 sec, 1 time (~ 4/5 following release) Symmetry: similar B Palpation: TTP to all PFM internally with scar restriction at B posterior fourchette. Prolapse: mild posterior wall with bearing down, ~ 1 cm above introitus.   Internal Rectal Exam: Deferred indefinately Strength (PERF): Symmetry: Palpation: Prolapse:   Abdominal:  10 finger diastasis, narrows to 8 with exhale/posterior pelvic tilt. Able to consistently do pelvic tilt with min to no cueing (from prior session)  Palpation: TTP to L Multifidus and erector spinae near SIJ and to B Glute Min/Med Gait Analysis: Following treatment, Pt. Taking ~ 50% longer steps without trendelenburg, FHP still present.  INTERVENTIONS THIS SESSION:  Self-care: Discussed success of cervical traction device and how to maximize it's potential. Discussed UTI and ways to help minimize frequency of UTI's including [place]. Instructed Pt. To try doing self TP release to her R glute Min whaen she is at home and see if it allows her to do L hip ABD without pain in R SIJ.  Therex: reviewed and practiced standing hip ABD and EXT and found much improved ability to recruit and increased ROM with LLE though still less than RLE. Practiced 2x5 on each side.   Manual: Performed TP release to L GLute max, multifidus and  erector spinae near SIJ and performed MET correction with manual resistance for L anterior rotation x2 to decrease spasm and pain and allow for improved balance of musculature and pelvic alignment for improved function and decreased symptoms.  Total time: 60                             PT Short Term Goals - 04/12/20 1424      PT SHORT TERM GOAL #1   Title Patient will demonstrate functional recruitment of TA with breathing, sit-to-stand, squatting/lifting, and walking to allow for improved pelvic brace coordination, improved balance, and decreased downward pressure on the pelvic organs.    Baseline Pt. demonstrates dysfunctional breathing and poor TA recruitment/awareness.    Time 5    Period Weeks    Status Achieved  Target Date 03/10/20      PT SHORT TERM GOAL #2   Title Patient will demonstrate improved pelvic alignment and balance of musculature surrounding the pelvis to facilitate decreased PFM spasms and decrease pelvic pain.    Baseline R anterior/L posterior pelvic obliquity and R thoracic scoliotic curve. As of 6/23: is ~ 50% better. and is feeling like she has better balance.    Time 10    Period Weeks    Status Achieved    Target Date 04/14/20      PT SHORT TERM GOAL #3   Title Patient will demonstrate HEP x1 in the clinic to demonstrate understanding and proper form to allow for further improvement.    Baseline Pt. lacks knowledge of which therapeutic exercises can help decrease her pain/Sx.    Time 5    Period Weeks    Status Achieved    Target Date 03/10/20      PT SHORT TERM GOAL #4   Title Pt. will demonstrate implementation of behavioral modifications such as fluid intake and use of Urge suppression technique to allow for decreased UUI/frequency.    Baseline Pt. having UUI daily, drinking a lot of Mtn. Dew. As of 7/12: is drinking some ~ 8 oz of water, ~ 4-5 cups of crystal light, ~ 1-2 decaffinated Mtn. Dew. and occasional glass of milk  and has noticed decreased Urinary incontinece.    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    Baseline Pt. having to change clothes 3 times per day and does not eat if leaving the house due to [place]. As of 6/23: Pt. only changing 1-2 times per day but still does not feel like she can eat if she has to leave the house. as of 7/12: No change n frequency.    Time 20    Period Weeks    Status On-going    Target Date 06/23/20      PT LONG TERM GOAL #2   Title Patient will describe pain no greater than 3/10 during standing or walking up to 30 min. to demonstrate improved functional ability and allow for return to church.    Baseline pain increases to 6/10 (enough to warrant resting) after 15 min. of standing or walking. As of 6/23: pain 4/10 at greatest with ADL's but has not walked for exercise yet. As of 7/12: has not had pain > 4/10 without brace and able to walk with brace into PT session withot pain.    Time 10    Period Weeks    Status On-going    Target Date 06/21/20      PT LONG TERM GOAL #3   Title Pt. will improve in FOTO score by 15 points in each category to demonstrate improved function.    Baseline FOTO PFDI Prolapse: 54 PFDI Bowel: 63, Urinary Problem: 44, Bowel Leakage 46, PFDI Urinary: 54, PFDI Pain 46. As of 7/13: FOTO PFDI Prolapse:21 PFDI Bowel: 33, Urinary Problem: 39, Bowel Leakage 54, PFDI Urinary: 42, PFDI Pain 0.  Time 20    Period Weeks    Status On-going    Target Date 06/23/20                 Plan - 05/10/20 1730    Clinical Impression Statement Pt. Responded well to all interventions today, demonstrating improved pelvic  alignment, increased LLE strength, decreased spasm, as well as understanding and correct performance of all education and exercises provided today. They will continue to benefit from skilled physical therapy to work toward remaining goals and maximize function as well as decrease likelihood of symptom increase or recurrence.    PT Next Visit Plan Ask about success with using Kegel/urge suppression for urge-suppression of bowel and bladder. Further TP release internally on R, Review hip flex/marching and give as HEP, further thoracic mobility, review hip-flexor stretch, side-stretch, LB stretch, standing pelvic tilts, weight shifts and add kegels to urge-suppression if appropriate. Assess internally when appropriate    PT Home Exercise Plan soda-can theory, belly breathing, urge suppression (breathing only), sit-to-stand with exhale, supine-to-sit, pelvic tilt with obliques, PFM and TA, hook-lying pelvic tilts, R hip EXT/heel press, heel-lift for LLE, hip IR with yellow band, scapular retraction, chin-tucks. weight-shift balances, supine chin-tucks, hip-flexor stretch, low back stretch, side-stretch, standing hip EXT/ABD, Kegels with ~ 5-10 sec. hold, modified child's pose, psillium husk at night, walking 3x per day (150 ft)    Consulted and Agree with Plan of Care Patient           Patient will benefit from skilled therapeutic intervention in order to improve the following deficits and impairments:     Visit Diagnosis: Other idiopathic scoliosis, thoracolumbar region  Other muscle spasm  Sacrococcygeal disorders, not elsewhere classified  Other lack of coordination     Problem List Patient Active Problem List   Diagnosis Date Noted  . Restless legs 10/24/2018  . 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 GERD (gastroesophageal reflux disease) 03/20/2018  . Lymphedema 12/25/2017  . Insomnia 12/18/2017  . Lung mass 03/13/2017  . Coin lesion 03/13/2017  . Advanced care  planning/counseling discussion 03/13/2017  . Back pain at L4-L5 level 01/01/2017  . Multiple pulmonary nodules 01/01/2017  . Incidental pulmonary nodule, greater than or equal to 42m 01/01/2017  . Bilateral leg edema 09/19/2016  . Normocytic normochromic anemia 08/03/2016  . Spondylolisthesis at L4-L5 level 07/10/2016  . Spinal stenosis of lumbar region with radiculopathy 04/27/2016  . Anxiety 04/27/2016  . Trochanteric bursitis of right hip 10/08/2015  . Hypertension 03/29/2015  . Hypercholesteremia 03/29/2015  . BMI 45.0-49.9, [place] 03/29/2015  . Arthralgia of hands, bilateral 03/29/2015   KWilla RoughDPT, ATC KWilla Rough8/06/2020, 5:31 PM  [facility] [address] [place] [address]Phone: [phone]  Fax:  [phone] Name: Tracy DENESMRN: [id number] of Birth: 503/14/1947

## 2020-05-11 ENCOUNTER — Encounter: Payer: Self-pay | Admitting: Nurse Practitioner

## 2020-05-11 ENCOUNTER — Telehealth: Payer: Self-pay | Admitting: Nurse Practitioner

## 2020-05-11 ENCOUNTER — Other Ambulatory Visit: Payer: Self-pay

## 2020-05-11 ENCOUNTER — Ambulatory Visit (INDEPENDENT_AMBULATORY_CARE_PROVIDER_SITE_OTHER): Payer: Medicare Other | Admitting: Nurse Practitioner

## 2020-05-11 VITALS — BP 113/70 | HR 62 | Temp 98.4°F | Wt 269.4 lb

## 2020-05-11 DIAGNOSIS — R600 Localized edema: Secondary | ICD-10-CM

## 2020-05-11 DIAGNOSIS — R82998 Other abnormal findings in urine: Secondary | ICD-10-CM | POA: Insufficient documentation

## 2020-05-11 DIAGNOSIS — R399 Unspecified symptoms and signs involving the genitourinary system: Secondary | ICD-10-CM | POA: Diagnosis not present

## 2020-05-11 MED ORDER — PHENAZOPYRIDINE HCL 97.2 MG PO TABS: 97.0000 mg | ORAL_TABLET | Freq: Three times a day (TID) | ORAL | 3 refills | Status: DC | PRN

## 2020-05-11 MED ORDER — CEPHALEXIN 500 MG PO CAPS: 500.0000 mg | ORAL_CAPSULE | Freq: Four times a day (QID) | ORAL | 0 refills | Status: AC

## 2020-05-11 NOTE — Assessment & Plan Note (Signed)
Chronic, ongoing.  Planning on seeing a Physical Therapist for lymphedema in future, not ready for referral quite yet as has quite a lot of her plate.  Will be available to patient when ready for referral.  Handicap Placard filled out today for patient, copy in chart.

## 2020-05-11 NOTE — Patient Instructions (Signed)
Nice meeting you today, Tracy Espinoza!   Urinary Tract Infection, Adult A urinary tract infection (UTI) is an infection of any part of the urinary tract. The urinary tract includes:  The kidneys.  The ureters.  The bladder.  The urethra. These organs make, store, and get rid of pee (urine) in the body. What are the causes? This is caused by germs (bacteria) in your genital area. These germs grow and cause swelling (inflammation) of your urinary tract. What increases the risk? You are more likely to develop this condition if:  You have a small, thin tube (catheter) to drain pee.  You cannot control when you pee or poop (incontinence).  You are female, and: ? You use these methods to prevent pregnancy:  A medicine that kills sperm (spermicide).  A device that blocks sperm (diaphragm). ? You have low levels of a female hormone (estrogen). ? You are pregnant.  You have genes that add to your risk.  You are sexually active.  You take antibiotic medicines.  You have trouble peeing because of: ? A prostate that is bigger than normal, if you are female. ? A blockage in the part of your body that drains pee from the bladder (urethra). ? A kidney stone. ? A nerve condition that affects your bladder (neurogenic bladder). ? Not getting enough to drink. ? Not peeing often enough.  You have other conditions, such as: ? Diabetes. ? A weak disease-fighting system (immune system). ? Sickle cell disease. ? Gout. ? Injury of the spine. What are the signs or symptoms? Symptoms of this condition include:  Needing to pee right away (urgently).  Peeing often.  Peeing small amounts often.  Pain or burning when peeing.  Blood in the pee.  Pee that smells bad or not like normal.  Trouble peeing.  Pee that is cloudy.  Fluid coming from the vagina, if you are female.  Pain in the belly or lower back. Other symptoms include:  Throwing up (vomiting).  No urge to eat.  Feeling  mixed up (confused).  Being tired and grouchy (irritable).  A fever.  Watery poop (diarrhea). How is this treated? This condition may be treated with:  Antibiotic medicine.  Other medicines.  Drinking enough water. Follow these instructions at home:  Medicines  Take over-the-counter and prescription medicines only as told by your doctor.  If you were prescribed an antibiotic medicine, take it as told by your doctor. Do not stop taking it even if you start to feel better. General instructions  Make sure you: ? Pee until your bladder is empty. ? Do not hold pee for a long time. ? Empty your bladder after sex. ? Wipe from front to back after pooping if you are a female. Use each tissue one time when you wipe.  Drink enough fluid to keep your pee pale yellow.  Keep all follow-up visits as told by your doctor. This is important. Contact a doctor if:  You do not get better after 1-2 days.  Your symptoms go away and then come back. Get help right away if:  You have very bad back pain.  You have very bad pain in your lower belly.  You have a fever.  You are sick to your stomach (nauseous).  You are throwing up. Summary  A urinary tract infection (UTI) is an infection of any part of the urinary tract.  This condition is caused by germs in your genital area.  There are many risk factors for a  UTI. These include having a small, thin tube to drain pee and not being able to control when you pee or poop.  Treatment includes antibiotic medicines for germs.  Drink enough fluid to keep your pee pale yellow. This information is not intended to replace advice given to you by your health care provider. Make sure you discuss any questions you have with your health care provider. Document Revised: 09/05/2018 Document Reviewed: 03/28/2018 Elsevier Patient Education  2020 [employer].

## 2020-05-11 NOTE — Telephone Encounter (Signed)
erroneous entry 
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
## 2020-05-11 NOTE — Assessment & Plan Note (Signed)
Acute, ongoing.  UA with 2+ leukocytes, 3+ blood, and protein.  Microscopic showed >30 WBC, 11-30 RBC and moderate amount of bacteria.  Will send for culture and treat in meantime with pyridium and cephalexin 500 mg four times daily for 7 days.  Increase hydration, use good urinary hygiene, and continue daily probiotic to prevent worsening diarrhea.  If symptoms do not improve near end of week, will consider switching to ciprofloxacin per patient's request - last urine culture showed sensitivity to both of these antibiotics.

## 2020-05-13 ENCOUNTER — Ambulatory Visit: Payer: Medicare Other

## 2020-05-16 ENCOUNTER — Other Ambulatory Visit: Payer: Self-pay | Admitting: Family Medicine

## 2020-05-16 NOTE — Telephone Encounter (Signed)
Requested Prescriptions  Pending Prescriptions Disp Refills  . gabapentin (NEURONTIN) 400 MG capsule [Pharmacy Med Name: GABAPENTIN  400MG   CAP] 360 capsule 3    Sig: TAKE UP TO 4 CAPSULES BY  MOUTH DAILY AS NEEDED     Neurology: Anticonvulsants - gabapentin Passed - 05/16/2020  4:40 AM      Passed - Valid encounter within last 12 months    Recent Outpatient Visits          5 days ago Leukocytes in urine   [place], NP   2 months ago [place], [place]   3 months ago Diarrhea, unspecified type   [address], [place], [place]   4 months ago Diarrhea, unspecified type   [facility], [place], [place]   5 months ago Diarrhea, unspecified type   [facility], Lilia Argue, PA-C      Future Appointments            In 2 months [place], Lennette Bihari, MD [place]   In 2 months [place], Barbaraann Faster, NP [employer], [facility]   In 33 months  [employer], [place]

## 2020-05-17 ENCOUNTER — Other Ambulatory Visit: Payer: Self-pay

## 2020-05-17 ENCOUNTER — Ambulatory Visit: Payer: Medicare Other

## 2020-05-17 DIAGNOSIS — R278 Other lack of coordination: Secondary | ICD-10-CM

## 2020-05-17 DIAGNOSIS — M533 Sacrococcygeal disorders, not elsewhere classified: Secondary | ICD-10-CM

## 2020-05-17 DIAGNOSIS — M62838 Other muscle spasm: Secondary | ICD-10-CM | POA: Diagnosis not present

## 2020-05-17 DIAGNOSIS — M4125 Other idiopathic scoliosis, thoracolumbar region: Secondary | ICD-10-CM

## 2020-05-17 LAB — MICROSCOPIC EXAMINATION: WBC, UA: >30 /hpf — AB (ref 0–5)

## 2020-05-17 LAB — UA/M W/RFLX CULTURE, ROUTINE
Bilirubin, UA: NEGATIVE
Glucose, UA: NEGATIVE
Nitrite, UA: NEGATIVE
Specific Gravity, UA: 1.02 (ref 1.005–1.030)
Urobilinogen, Ur: 0.2 mg/dL (ref 0.2–1.0)
pH, UA: 5 (ref 5.0–7.5)

## 2020-05-17 LAB — URINE CULTURE, REFLEX
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
## 2020-05-20 ENCOUNTER — Other Ambulatory Visit: Payer: Self-pay

## 2020-05-20 ENCOUNTER — Ambulatory Visit: Payer: Medicare Other

## 2020-05-20 DIAGNOSIS — M62838 Other muscle spasm: Secondary | ICD-10-CM | POA: Diagnosis not present

## 2020-05-20 DIAGNOSIS — M4125 Other idiopathic scoliosis, thoracolumbar region: Secondary | ICD-10-CM | POA: Diagnosis not present

## 2020-05-20 DIAGNOSIS — M533 Sacrococcygeal disorders, not elsewhere classified: Secondary | ICD-10-CM

## 2020-05-20 DIAGNOSIS — R278 Other lack of coordination: Secondary | ICD-10-CM

## 2020-05-20 NOTE — Patient Instructions (Signed)
When you do your pelvic tilts in hook-lying (on the bed) make sure you are also trying to feel the glutes fire (like you are going to bridge up). The back should flatten into the bed (not arch away) if you are doing it right. If your low back starts to hurt, you are using your back, do not continue.   Lumbar rotations: Inhale as you let the knees fall to the side, exhale to come back to the middle. Do 2x15 total.
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
## 2020-05-21 ENCOUNTER — Other Ambulatory Visit: Payer: Self-pay | Admitting: Family Medicine

## 2020-05-21 NOTE — Telephone Encounter (Signed)
Requested Prescriptions  Pending Prescriptions Disp Refills  . traZODone (DESYREL) 100 MG tablet [Pharmacy Med Name: TRAZODONE  100MG   TAB] 90 tablet 0    Sig: TAKE 1 TABLET BY MOUTH AT  BEDTIME     Psychiatry: Antidepressants - Serotonin Modulator Passed - 05/21/2020  9:24 PM      Passed - Valid encounter within last 6 months    Recent Outpatient Visits          1 week ago Leukocytes in urine   [facility] Eulogio Bear, NP   2 months ago [place], [place]   3 months ago Diarrhea, unspecified type   [place], [place], [place]   4 months ago Diarrhea, unspecified type   [facility], [place], [place]   5 months ago Diarrhea, unspecified type   [facility], [place], [place]      Future Appointments            In 2 months Tahiliani, Lennette Bihari, MD [place]   In 2 months Adelino, Barbaraann Faster, NP [employer], [place]   In 43 months  [employer], [place]

## 2020-05-24 ENCOUNTER — Other Ambulatory Visit: Payer: Self-pay

## 2020-05-24 ENCOUNTER — Ambulatory Visit: Payer: Medicare Other

## 2020-05-24 DIAGNOSIS — M4125 Other idiopathic scoliosis, thoracolumbar region: Secondary | ICD-10-CM | POA: Diagnosis not present

## 2020-05-24 DIAGNOSIS — R278 Other lack of coordination: Secondary | ICD-10-CM | POA: Diagnosis not present

## 2020-05-24 DIAGNOSIS — M533 Sacrococcygeal disorders, not elsewhere classified: Secondary | ICD-10-CM

## 2020-05-24 DIAGNOSIS — M62838 Other muscle spasm: Secondary | ICD-10-CM | POA: Diagnosis not present

## 2020-05-24 NOTE — Therapy (Signed)
[place] MAIN [facility] SERVICES [address] [place], [place], [address] Phone: [phone]   Fax:  [phone]  Physical Therapy Progress Note   Dates of reporting period  04/12/20   to   05/24/20   The patient has been informed of current processes in place at [facility]patient [facility] to protect patients from Covid-19 exposure including social distancing, schedule modifications, and new cleaning procedures. After discussing their particular risk with a therapist based on the patient's personal risk factors, the patient has decided to proceed with in-person therapy.  Patient Details  Name: Tracy Espinoza MRN: [id number] Date of Birth: 11/01/1945 No data recorded  Encounter Date: 05/24/2020   PT End of Session - 05/24/20 1428    Visit Number 25    Number of Visits 35    Date for PT Re-Evaluation 06/23/20    Authorization Type Medicare    Authorization Time Period 7/12 through 06/21/20    Authorization - Visit Number 10    Authorization - Number of Visits 20    Progress Note Due on Visit 20    PT Start Time 1330    PT Stop Time 1430    PT Time Calculation (min) 60 min    Activity Tolerance Patient tolerated treatment well;No increased pain    Behavior During Therapy WFL for tasks assessed/performed           Past Medical History:  Diagnosis Date  . Anxiety   . Arthritis   . Cancer ([place])    BASAL CELL SKIN CANCERS REMOVED  . Depression   . Edema    FEET/LEGS  . GERD (gastroesophageal reflux disease)   . Heart murmur   . History of hiatal hernia   . Hypertension   . Lymphedema   . MRSA (methicillin resistant staph aureus) culture positive    H/O    Past Surgical History:  Procedure Laterality Date  . ABDOMINAL HYSTERECTOMY    . APPENDECTOMY    . BACK SURGERY    . CATARACT EXTRACTION W/PHACO Left 03/28/2018   Procedure: CATARACT EXTRACTION PHACO AND INTRAOCULAR LENS PLACEMENT (IOC);  Surgeon: Eulogio Bear, MD;  Location: [facility]  ORS;  Service: Ophthalmology;  Laterality: Left;  [place] 00:28.2 AP% 7.9 CDE 2.21 Fluid pack lot # [id number]  . CATARACT EXTRACTION W/PHACO Right 05/16/2018   Procedure: CATARACT EXTRACTION PHACO AND INTRAOCULAR LENS PLACEMENT (IOC);  Surgeon: Eulogio Bear, MD;  Location: [facility] ORS;  Service: Ophthalmology;  Laterality: Right;  [place] 00:26.2 AP% 7.6 CDE 2.01 Fluid pack lot # [id number]  . CHOLECYSTECTOMY    . COLONOSCOPY    . FRACTURE SURGERY     BONE ON SIDE OF RIGHT FOOT  . HERNIA REPAIR     UMBILICAL X 2  . JOINT REPLACEMENT Bilateral    knees  . KIDNEY SURGERY    . LUMBAR WOUND DEBRIDEMENT N/A 08/03/2016   Procedure: IRRIGATION AND DEBRIDEMENT LUMBAR WOUND;  Surgeon: Eustace Moore, MD;  Location: [place];  Service: Neurosurgery;  Laterality: N/A;  . OVARIAN CYST SURGERY    . REPAIR OF LEFT URETER Left 40 YRS AGO  . ROTATOR CUFF REPAIR  2014  . TONSILLECTOMY    . TUBAL LIGATION      There were no vitals filed for this visit.  Pelvic Floor Physical Therapy Treatment Note  SCREENING  Changes in medications, allergies, or medical history?: none    SUBJECTIVE  Patient reports: Has a follow up appointment with her GI doctor  in October. Still feeling cruddy like when she had a UTI, having pain in the back and the R anterior hip. Has been able to get up out of her chair without needing her bladder control pad at all for the last 2 weeks. She has not needed the one near the bed side ~ 70% of the time.  Has not tried [place] yet.   Precautions:  Abdominal hysterectomy, Lymphedema (diagnosed ~ 3 years ago) (needs to be wearing pressure boots) L4-5 fusion in 2017. Her cervical traction came in and it has helped. Diverticulosis in 2015  Location of pain: LB/R hip (neck/shoulder) Current pain: 7/10  (7/10) Max pain: 7/10 (7/10) Least pain: 0/10 (4/10) Nature of pain:"grinding" sore/achy  **3/10 pain in R hip/LB following treatment   Patient Goals: Would like to get a handle on her  diarrhea so it is not so liquid and she can control it better. Would like to be able to go out to shop or go to church without fear of FI. Not have to change clothes more than 1 time per day due to leakage.  OBJECTIVE  Changes in: Posture/Observations:  Hyperlordosis/kyphosis, FHP, prominent C7, and R thoracic scoliosis. (from prior note)   Range of Motion/Flexibilty:   Strength/MMT:  4+ for knee EXT and flex, 4/5 for hip flex, DF, hip EXT R hip IR ~ 4/5 (from prior visit)  Pelvic Floor External Exam: (From previous session) Introitus Appears: mildly gaping Skin integrity: vaginal warts and reddened, moist skin. Palpation: TTP through B STP Cough: not assessed Prolapse visible?: no Scar mobility: decreased at 5 and 7 o'clock  Internal Vaginal Exam: Strength (PERF): 3/5, 10 sec, 1 time (~ 4/5 following release) Symmetry: similar B Palpation: TTP to all PFM internally with scar restriction at B posterior fourchette. Prolapse: mild posterior wall with bearing down, ~ 1 cm above introitus.   Internal Rectal Exam: Deferred indefinately Strength (PERF): Symmetry: Palpation: Prolapse:   Abdominal:  Pt. Unable to recruit L TAand rectus in hook-lying due to length-tension relationship because she is unable to bridge to flatten out the LB. In seated, Pt. Demonstrates significant improvement in ability to tense across the diastasis with posterior pelvic tilts.  Palpation: TTP to R Iliacus and Psoas  Gait Analysis: Following treatment, Pt. Taking ~ 50% longer steps without trendelenburg, FHP still present. (from prior session)  TODAY: Pt. Continues to walk slower than she was pre-UTI and appears to have less energy, more forward rounding.  INTERVENTIONS THIS SESSION:   Manual: Performed TP release and STM to R Iliacus and Psoas to decrease spasm and pain and allow for improved balance of musculature and pelvic alignment for improved function and decreased symptoms.    Dry-needle: Performed TPDN with a .30x175mm needle and standard approach as described below to decrease spasm and pain and allow for improved balance of musculature for improved function and decreased symptoms.  Self-care: reviewed Goals and POC, problem-solved for feet slipping at home while doing lumbar twists by providing Pt. With grip-socks to allow improved HEP performance.     Total time: 60                       Trigger Point Dry Needling - 05/24/20 0001    Consent Given? Yes    Education Handout Provided No    Muscles Treated Back/Hip Iliacus    Dry Needling Comments Right    Iliacus Response Twitch response elicited;Palpable increased muscle length  PT Short Term Goals - 05/24/20 1342      PT SHORT TERM GOAL #1   Title Patient will demonstrate functional recruitment of TA with breathing, sit-to-stand, squatting/lifting, and walking to allow for improved pelvic brace coordination, improved balance, and decreased downward pressure on the pelvic organs.    Baseline Pt. demonstrates dysfunctional breathing and poor TA recruitment/awareness.    Time 5    Period Weeks    Status Achieved    Target Date 03/10/20      PT SHORT TERM GOAL #2   Title Patient will demonstrate improved pelvic alignment and balance of musculature surrounding the pelvis to facilitate decreased PFM spasms and decrease pelvic pain.    Baseline R anterior/L posterior pelvic obliquity and R thoracic scoliotic curve. As of 6/23: is ~ 50% better. and is feeling like she has better balance.    Time 10    Period Weeks    Status Achieved    Target Date 04/14/20      PT SHORT TERM GOAL #3   Title Patient will demonstrate HEP x1 in the clinic to demonstrate understanding and proper form to allow for further improvement.    Baseline Pt. lacks knowledge of which therapeutic exercises can help decrease her pain/Sx.    Time 5    Period Weeks    Status Achieved     Target Date 03/10/20      PT SHORT TERM GOAL #4   Title Pt. will demonstrate implementation of behavioral modifications such as fluid intake and use of Urge suppression technique to allow for decreased UUI/frequency.    Baseline Pt. having UUI daily, drinking a lot of Mtn. Dew. As of 7/12: is drinking some ~ 8 oz of water, ~ 4-5 cups of crystal light, ~ 1-2 decaffinated Mtn. Dew. and occasional glass of milk and has noticed decreased Urinary incontinece.    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    Baseline Pt. having to change clothes 3 times per day and does not eat if leaving the house due to [place]. As of 6/23: Pt. only changing 1-2 times per day but still does not feel like she can eat if she has to leave the house. as of 7/12: No change n frequency. As of    Time 20    Period Weeks    Status On-going    Target Date 06/23/20      PT LONG TERM GOAL #2   Title Patient will describe pain no greater than 3/10 during standing or walking up to 30 min. to demonstrate improved functional ability and allow for return to church.    Baseline pain increases to 6/10 (enough to warrant resting) after 15 min. of standing or walking. As of 6/23: pain 4/10 at greatest with ADL's but has not walked for exercise yet. As of 7/12: has not had pain > 4/10 without brace and able to walk with brace into PT session withot pain. As of 8/23: Pain at 7/10 at higest, worse this week than  it had been (since UTI)    Time 10    Period Weeks    Status On-going    Target Date 06/23/20      PT LONG TERM GOAL #3   Title Pt. will improve in FOTO score by 15 points in each category to  demonstrate improved function.    Baseline FOTO PFDI Prolapse: 54 PFDI Bowel: 63, Urinary Problem: 44, Bowel Leakage 46, PFDI Urinary: 54, PFDI Pain 46. As of 7/13: FOTO PFDI Prolapse:21 PFDI Bowel: 33, Urinary Problem: 39, Bowel Leakage 54, PFDI Urinary: 42, PFDI Pain 0. As of 05/24/20: FOTO PFDI Prolapse:21 PFDI Bowel: 46, Urinary Problem: 21, Bowel Leakage 54, PFDI Urinary: 54, PFDI Pain 29    Time 20    Period Weeks    Status On-going    Target Date 06/23/20                 Plan - 05/24/20 1429    Clinical Impression Statement Pt. has continued to make slow but steady progress in both her strength/balance and her bladder/bowel control but has had a slight set-back over the past 2 weeks as she has had a UTI and in the aftermath which has kept her from being as active and regular with her walking and exercises and resulted in higher pain. She reports having continued imcreased balance/stability and demonstrates improved sit-to-stand and supine-to-sit transfers with no assistance needed 90% of the time. She has improved ability to get to the bathroom without SUI upon standing and more often is getting to or at least near the bathroom before having FI. She remains dedicated to PT and motivated to continue to improve and is expected to continue to make progress with PT. We will continue PT at 2x/week for at least 5 more weeks to build on the progress made and work toward greater improvement in bowel and bladder control. The Pt's progress with bowel control is still somewhat limited by GI dysfunction that is causing nearly constant diarrhea of unknown origin.    Personal Factors and Comorbidities Age;Comorbidity 3+;Fitness;Time since onset of injury/illness/exacerbation;Social Background    Examination-Activity Limitations Squat;Stairs;Stand;Bend;Continence;Toileting    Examination-Participation Restrictions Church;Community Activity;Cleaning;Laundry;Meal Prep    Stability/Clinical Decision Making  Unstable/Unpredictable    Clinical Decision Making High    Rehab Potential Fair    PT Frequency 2x / week    PT Treatment/Interventions ADLs/Self Care Home Management;Biofeedback;Aquatic Therapy;Electrical Stimulation;Moist Heat;DME Instruction;Ultrasound;Gait training;Stair training;Functional mobility training;Therapeutic activities;Neuromuscular re-education;Balance training;Therapeutic exercise;Patient/family education;Scar mobilization;Compression bandaging;Manual lymph drainage;Manual techniques;Passive range of motion;Dry needling;Taping;Spinal Manipulations;Joint Manipulations    PT Next Visit Plan Further TP release internally on R, Try mini-squats, further thoracic mobility, review hip-flexor stretch, side-stretch, LB stretch, standing pelvic tilts, weight shifts and add kegels to urge-suppression if appropriate. Assess internally when appropriate    PT Home Exercise Plan soda-can theory, belly breathing, urge suppression (breathing only), sit-to-stand with exhale, supine-to-sit, pelvic tilt with obliques, PFM and TA, hook-lying pelvic tilts, R hip EXT/heel press, heel-lift for LLE, hip IR with yellow band, scapular retraction, chin-tucks. weight-shift balances, supine chin-tucks, hip-flexor stretch, low back stretch, side-stretch, standing hip EXT/ABD, Keg[place] with ~ 5-10 sec. hold, modified child's pose, psillium husk at night, walking 3x per day (150 ft), lumbar rotations in hook-lying    Consulted and Agree with Plan of Care Patient           Patient will benefit from skilled therapeutic intervention in order to improve the following deficits and impairments:  Abnormal gait, Decreased balance, Decreased endurance,  Decreased mobility, Difficulty walking, Increased muscle spasms, Obesity, Improper body mechanics, Increased edema, Decreased scar mobility, Decreased range of motion, Decreased activity tolerance, Decreased coordination, Decreased strength, Increased fascial restricitons,  Impaired flexibility, Postural dysfunction, Pain  Visit Diagnosis: Other idiopathic scoliosis, thoracolumbar region  Other muscle spasm  Sacrococcygeal disorders, not elsewhere classified  Other lack of coordination     Problem List Patient Active Problem List   Diagnosis Date Noted  . Leukocytes in urine 05/11/2020  . Restless legs 10/24/2018  . 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 GERD (gastroesophageal reflux disease) 03/20/2018  . Lymphedema 12/25/2017  . Insomnia 12/18/2017  . Lung mass 03/13/2017  . Coin lesion 03/13/2017  . Advanced care planning/counseling discussion 03/13/2017  . Back pain at L4-L5 level 01/01/2017  . Multiple pulmonary nodules 01/01/2017  . Incidental pulmonary nodule, greater than or equal to 65mm 01/01/2017  . Bilateral leg edema 09/19/2016  . Normocytic normochromic anemia 08/03/2016  . Spondylolisthesis at L4-L5 level 07/10/2016  . Spinal stenosis of lumbar region with radiculopathy 04/27/2016  . Anxiety 04/27/2016  . Trochanteric bursitis of right hip 10/08/2015  . Hypertension 03/29/2015  . Hypercholesteremia 03/29/2015  . BMI 45.0-49.9, adult ([place]) 03/29/2015  . Arthralgia of hands, bilateral 03/29/2015   Willa Rough DPT, ATC Willa Rough 05/24/2020, 2:57 PM  [place] MAIN [facility] SERVICES [address] [place], [place], [address] Phone: [phone]   Fax:  [phone]  Name: DEVORAH GIVHAN MRN: [id number] Date of Birth: 20-Jul-1946

## 2020-05-25 ENCOUNTER — Ambulatory Visit: Payer: Medicare Other

## 2020-05-27 ENCOUNTER — Other Ambulatory Visit: Payer: Self-pay

## 2020-05-27 ENCOUNTER — Ambulatory Visit: Payer: Medicare Other

## 2020-05-27 DIAGNOSIS — R278 Other lack of coordination: Secondary | ICD-10-CM

## 2020-05-27 DIAGNOSIS — M62838 Other muscle spasm: Secondary | ICD-10-CM | POA: Diagnosis not present

## 2020-05-27 DIAGNOSIS — M4125 Other idiopathic scoliosis, thoracolumbar region: Secondary | ICD-10-CM

## 2020-05-27 DIAGNOSIS — M533 Sacrococcygeal disorders, not elsewhere classified: Secondary | ICD-10-CM | POA: Diagnosis not present

## 2020-05-27 NOTE — Therapy (Signed)
Tracy Espinoza [facility] SERVICES [address] [place], [place], [address] Phone: [phone]   Fax:  [phone]  Physical Therapy Treatment  The patient has been informed of current processes in place at [facility]patient [facility] to protect patients from Covid-19 exposure including social distancing, schedule modifications, and new cleaning procedures. After discussing their particular risk with a therapist based on the patient's personal risk factors, the patient has decided to proceed with in-person therapy.   Patient Details  Name: Tracy Espinoza MRN: [id number] Date of Birth: 19-Aug-1946 No data recorded  Encounter Date: 05/27/2020   PT End of Session - 05/27/20 1720    Visit Number 26    Number of Visits 35    Date for PT Re-Evaluation 06/23/20    Authorization Type Medicare    Authorization Time Period 7/12 through 06/21/20    Authorization - Visit Number 11    Authorization - Number of Visits 20    Progress Note Due on Visit 20    PT Start Time 1330    PT Stop Time 1430    PT Time Calculation (min) 60 min    Activity Tolerance Patient tolerated treatment well;No increased pain    Behavior During Therapy WFL for tasks assessed/performed           Past Medical History:  Diagnosis Date  . Anxiety   . Arthritis   . Cancer ([place])    BASAL CELL SKIN CANCERS REMOVED  . Depression   . Edema    FEET/LEGS  . GERD (gastroesophageal reflux disease)   . Heart murmur   . History of hiatal hernia   . Hypertension   . Lymphedema   . MRSA (methicillin resistant staph aureus) culture positive    H/O    Past Surgical History:  Procedure Laterality Date  . ABDOMINAL HYSTERECTOMY    . APPENDECTOMY    . BACK SURGERY    . CATARACT EXTRACTION W/PHACO Left 03/28/2018   Procedure: CATARACT EXTRACTION PHACO AND INTRAOCULAR LENS PLACEMENT (IOC);  Surgeon: Eulogio Bear, MD;  Location: [facility];  Service: Ophthalmology;  Laterality: Left;  [place]  00:28.2 AP% 7.9 CDE 2.21 Fluid pack lot # [id number]  . CATARACT EXTRACTION W/PHACO Right 05/16/2018   Procedure: CATARACT EXTRACTION PHACO AND INTRAOCULAR LENS PLACEMENT (IOC);  Surgeon: Eulogio Bear, MD;  Location: [facility];  Service: Ophthalmology;  Laterality: Right;  [place] 00:26.2 AP% 7.6 CDE 2.01 Fluid pack lot # [id number]  . CHOLECYSTECTOMY    . COLONOSCOPY    . FRACTURE SURGERY     BONE ON SIDE OF RIGHT FOOT  . HERNIA REPAIR     UMBILICAL X 2  . JOINT REPLACEMENT Bilateral    knees  . KIDNEY SURGERY    . LUMBAR WOUND DEBRIDEMENT N/A 08/03/2016   Procedure: IRRIGATION AND DEBRIDEMENT LUMBAR WOUND;  Surgeon: Eustace Moore, MD;  Location: [place];  Service: Neurosurgery;  Laterality: N/A;  . OVARIAN CYST SURGERY    . REPAIR OF LEFT URETER Left 40 YRS AGO  . ROTATOR CUFF REPAIR  2014  . TONSILLECTOMY    . TUBAL LIGATION      There were no vitals filed for this visit.  Pelvic Floor Physical Therapy Treatment Note  SCREENING  Changes in medications, allergies, or medical history?: none    SUBJECTIVE  Patient reports: Her back is less painful but still somewhat painful, hip is feeling better. Monday was terrible but has been better the last couple days  even though she has been eating mac and cheese and 1% milk with cereal this morning with no problem. Has lost 19 lbs.   Precautions:  Abdominal hysterectomy, Lymphedema (diagnosed ~ 3 years ago) (needs to be wearing pressure boots) L4-5 fusion in 2017. Her cervical traction came in and it has helped. Diverticulosis in 2015  Location of pain: LB/R hip (neck/shoulder) Current pain: 2/10  (7/10) Max pain: 7/10 (7/10) Least pain: 0/10 (4/10) Nature of pain:"grinding" sore/achy  **0/10 pain in R hip/LB following treatment   Patient Goals: Would like to get a handle on her diarrhea so it is not so liquid and she can control it better. Would like to be able to go out to shop or go to church without fear of FI. Not have  to change clothes more than 1 time per day due to leakage.  OBJECTIVE  Changes in: Posture/Observations:  Hyperlordosis/kyphosis, FHP, prominent C7, and R thoracic scoliosis. (from prior note)   Range of Motion/Flexibilty:   Strength/MMT:  4+ for knee EXT and flex, 4/5 for hip flex, DF, hip EXT R hip IR ~ 4/5 (from prior visit)  Pelvic Floor External Exam: (From previous session) Introitus Appears: mildly gaping Skin integrity: vaginal warts and reddened, moist skin. Palpation: TTP through B STP Cough: not assessed Prolapse visible?: no Scar mobility: decreased at 5 and 7 o'clock  Internal Vaginal Exam: Strength (PERF): 3/5, 10 sec, 1 time (~ 4/5 following release) Symmetry: similar B Palpation: TTP to all PFM internally with scar restriction at B posterior fourchette. Prolapse: mild posterior wall with bearing down, ~ 1 cm above introitus.   Internal Rectal Exam: Deferred indefinately Strength (PERF): Symmetry: Palpation: Prolapse:   Abdominal:  Pt. Demonstrated 100% consistency with coordination of deep-core with posterior pelvic tilt in seated following manual/DN treatment.   Palpation: TTP to R Iliacus and Psoas  Gait Analysis: Following treatment, Pt. Taking ~ 50% longer steps without trendelenburg, FHP still present. (from prior session)  TODAY: Needed reminder to take longer strides and RLE turns out with advancing RLE but Pt. Self-corrected for head/shoulders most of the time.  INTERVENTIONS THIS SESSION:   Manual: Performed TP release and STM to B lumbar multififus and paraspinals to decrease spasm and pain and allow for improved balance of musculature and pelvic alignment for improved function and decreased symptoms.   Dry-needle: Performed TPDN with a .30x125m needle and standard approach as described below to decrease spasm and pain and allow for improved balance of musculature for improved function and decreased symptoms.  Therex: reviewed pelvic  tilts in seated and practiced with emphasis on exhaling until she cannot anymore, feeling the glutes engage, and feeling the obliques kick in at the end of her exhale. Reviewed importance of returning to her walking program around the house now that she is not feeling bad from the UTI still.    Total time: 60                      Trigger Point Dry Needling - 05/27/20 0001    Consent Given? Yes    Education Handout Provided No    Muscles Treated Back/Hip Erector spinae;Lumbar multifidi    Erector spinae Response Twitch response elicited;Palpable increased muscle length    Lumbar multifidi Response Twitch response elicited;Palpable increased muscle length                  PT Short Term Goals - 05/24/20 1342      PT  SHORT TERM GOAL #1   Title Patient will demonstrate functional recruitment of TA with breathing, sit-to-stand, squatting/lifting, and walking to allow for improved pelvic brace coordination, improved balance, and decreased downward pressure on the pelvic organs.    Baseline Pt. demonstrates dysfunctional breathing and poor TA recruitment/awareness.    Time 5    Period Weeks    Status Achieved    Target Date 03/10/20      PT SHORT TERM GOAL #2   Title Patient will demonstrate improved pelvic alignment and balance of musculature surrounding the pelvis to facilitate decreased PFM spasms and decrease pelvic pain.    Baseline R anterior/L posterior pelvic obliquity and R thoracic scoliotic curve. As of 6/23: is ~ 50% better. and is feeling like she has better balance.    Time 10    Period Weeks    Status Achieved    Target Date 04/14/20      PT SHORT TERM GOAL #3   Title Patient will demonstrate HEP x1 in the clinic to demonstrate understanding and proper form to allow for further improvement.    Baseline Pt. lacks knowledge of which therapeutic exercises can help decrease her pain/Sx.    Time 5    Period Weeks    Status Achieved    Target Date  03/10/20      PT SHORT TERM GOAL #4   Title Pt. will demonstrate implementation of behavioral modifications such as fluid intake and use of Urge suppression technique to allow for decreased UUI/frequency.    Baseline Pt. having UUI daily, drinking a lot of Mtn. Dew. As of 7/12: is drinking some ~ 8 oz of water, ~ 4-5 cups of crystal light, ~ 1-2 decaffinated Mtn. Dew. and occasional glass of milk and has noticed decreased Urinary incontinece.    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    Baseline Pt. having to change clothes 3 times per day and does not eat if leaving the house due to [place]. As of 6/23: Pt. only changing 1-2 times per day but still does not feel like she can eat if she has to leave the house. as of 7/12: No change n frequency. As of    Time 20    Period Weeks    Status On-going    Target Date 06/23/20      PT LONG TERM GOAL #2   Title Patient will describe pain no greater than 3/10 during standing or walking up to 30 min. to demonstrate improved functional ability and allow for return to church.    Baseline pain increases to 6/10 (enough to warrant resting) after 15 min. of standing or walking. As of 6/23: pain 4/10 at greatest with ADL's but has not walked for exercise yet. As of 7/12: has not had pain > 4/10 without brace and able to walk with brace into PT session withot pain. As of 8/23: Pain at 7/10 at higest, worse this week than it had been (since UTI)    Time 10  Period Weeks    Status On-going    Target Date 06/23/20      PT LONG TERM GOAL #3   Title Pt. will improve in FOTO score by 15 points in each category to demonstrate improved  function.    Baseline FOTO PFDI Prolapse: 54 PFDI Bowel: 63, Urinary Problem: 44, Bowel Leakage 46, PFDI Urinary: 54, PFDI Pain 46. As of 7/13: FOTO PFDI Prolapse:21 PFDI Bowel: 33, Urinary Problem: 39, Bowel Leakage 54, PFDI Urinary: 42, PFDI Pain 0. As of 05/24/20: FOTO PFDI Prolapse:21 PFDI Bowel: 46, Urinary Problem: 21, Bowel Leakage 54, PFDI Urinary: 54, PFDI Pain 29    Time 20    Period Weeks    Status On-going    Target Date 06/23/20                 Plan - 05/27/20 1726    Clinical Impression Statement Pt. Responded well to all interventions today, demonstrating improved ability to recruit deep-core and decreased spasms and pain as well as understanding and correct performance of all education and exercises provided today. They will continue to benefit from skilled physical therapy to work toward remaining goals and maximize function as well as decrease likelihood of symptom increase or recurrence.   PT Next Visit Plan Further TP release internally on R, Try mini-squats, further thoracic mobility, review hip-flexor stretch, side-stretch, LB stretch, standing pelvic tilts, weight shifts and add kegels to urge-suppression if appropriate. Assess internally when appropriate    PT Home Exercise Plan soda-can theory, belly breathing, urge suppression (breathing only), sit-to-stand with exhale, supine-to-sit, pelvic tilt with obliques, PFM and TA, hook-lying pelvic tilts, R hip EXT/heel press, heel-lift for LLE, hip IR with yellow band, scapular retraction, chin-tucks. weight-shift balances, supine chin-tucks, hip-flexor stretch, low back stretch, side-stretch, standing hip EXT/ABD, Kegels with ~ 5-10 sec. hold, modified child's pose, psillium husk at night, walking 3x per day (150 ft), lumbar rotations in hook-lying    Consulted and Agree with Plan of Care Patient           Patient will benefit from skilled therapeutic intervention in order to improve the following deficits and  impairments:     Visit Diagnosis: Other idiopathic scoliosis, thoracolumbar region  Other muscle spasm  Sacrococcygeal disorders, not elsewhere classified  Other lack of coordination     Problem List Patient Active Problem List   Diagnosis Date Noted  . Leukocytes in urine 05/11/2020  . Restless legs 10/24/2018  . 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 GERD (gastroesophageal reflux disease) 03/20/2018  . Lymphedema 12/25/2017  . Insomnia 12/18/2017  . Lung mass 03/13/2017  . Coin lesion 03/13/2017  . Advanced care planning/counseling discussion 03/13/2017  . Back pain at L4-L5 level 01/01/2017  . Multiple pulmonary nodules 01/01/2017  . Incidental pulmonary nodule, greater than or equal to 42m 01/01/2017  . Bilateral leg edema 09/19/2016  . Normocytic normochromic anemia 08/03/2016  . Spondylolisthesis at L4-L5 level 07/10/2016  . Spinal stenosis of lumbar region with radiculopathy 04/27/2016  . Anxiety 04/27/2016  . Trochanteric bursitis of right hip 10/08/2015  . Hypertension 03/29/2015  . Hypercholesteremia 03/29/2015  . BMI 45.0-49.9, [place] 03/29/2015  . Arthralgia of hands, bilateral 03/29/2015   KWilla RoughDPT, ATC KWilla Rough8/26/2021, 5:33 PM  [place]MAIN [facility]SERV[facility] [address] [address]Phone: [phone]  Fax:  [phone] Name: DSHERALEE QAZIMRN: [id number] of Birth: 5Apr 10, 1947

## 2020-05-31 ENCOUNTER — Other Ambulatory Visit: Payer: Self-pay

## 2020-05-31 ENCOUNTER — Ambulatory Visit: Payer: Medicare Other

## 2020-05-31 DIAGNOSIS — M533 Sacrococcygeal disorders, not elsewhere classified: Secondary | ICD-10-CM

## 2020-05-31 DIAGNOSIS — M4125 Other idiopathic scoliosis, thoracolumbar region: Secondary | ICD-10-CM | POA: Diagnosis not present

## 2020-05-31 DIAGNOSIS — R278 Other lack of coordination: Secondary | ICD-10-CM | POA: Diagnosis not present

## 2020-05-31 DIAGNOSIS — M62838 Other muscle spasm: Secondary | ICD-10-CM

## 2020-05-31 NOTE — Patient Instructions (Addendum)
Try to make sure you are getting two sessions in with your "pressure boots" even if you have to do one later in the day to see if it makes a difference in you night-time wetness.   Try to add in one loop where you are going outside with the walker later in the evening.  When walking with the walker, I want you to focus on lifting the knee and landing heel-toe!

## 2020-05-31 NOTE — Therapy (Signed)
[place] MAIN [facility] SERVICES [address] [place], [place], [address] Phone: [phone]   Fax:  [phone]  Physical Therapy Treatment  The patient has been informed of current processes in place at Outpatient Rehab to protect patients from Covid-19 exposure including social distancing, schedule modifications, and new cleaning procedures. After discussing their particular risk with a therapist based on the patient's personal risk factors, the patient has decided to proceed with in-person therapy.   Patient Details  Name: Tracy Espinoza MRN: [id number] Date of Birth: Aug 01, 1946 No data recorded  Encounter Date: 05/31/2020   PT End of Session - 05/31/20 1438    Visit Number 27    Number of Visits 35    Date for PT Re-Evaluation 06/23/20    Authorization Type Medicare    Authorization Time Period 7/12 through 06/21/20    Authorization - Visit Number 12    Authorization - Number of Visits 20    Progress Note Due on Visit 20    PT Start Time 1330    PT Stop Time 1430    PT Time Calculation (min) 60 min    Activity Tolerance Patient tolerated treatment well;No increased pain    Behavior During Therapy WFL for tasks assessed/performed           Past Medical History:  Diagnosis Date  . Anxiety   . Arthritis   . Cancer ([place])    BASAL CELL SKIN CANCERS REMOVED  . Depression   . Edema    FEET/LEGS  . GERD (gastroesophageal reflux disease)   . Heart murmur   . History of hiatal hernia   . Hypertension   . Lymphedema   . MRSA (methicillin resistant staph aureus) culture positive    H/O    Past Surgical History:  Procedure Laterality Date  . ABDOMINAL HYSTERECTOMY    . APPENDECTOMY    . BACK SURGERY    . CATARACT EXTRACTION W/PHACO Left 03/28/2018   Procedure: CATARACT EXTRACTION PHACO AND INTRAOCULAR LENS PLACEMENT (IOC);  Surgeon: Eulogio Bear, MD;  Location: [facility] ORS;  Service: Ophthalmology;  Laterality: Left;  [place]  00:28.2 AP% 7.9 CDE 2.21 Fluid pack lot # [id number]  . CATARACT EXTRACTION W/PHACO Right 05/16/2018   Procedure: CATARACT EXTRACTION PHACO AND INTRAOCULAR LENS PLACEMENT (IOC);  Surgeon: Eulogio Bear, MD;  Location: [facility] ORS;  Service: Ophthalmology;  Laterality: Right;  [place] 00:26.2 AP% 7.6 CDE 2.01 Fluid pack lot # [id number]  . CHOLECYSTECTOMY    . COLONOSCOPY    . FRACTURE SURGERY     BONE ON SIDE OF RIGHT FOOT  . HERNIA REPAIR     UMBILICAL X 2  . JOINT REPLACEMENT Bilateral    knees  . KIDNEY SURGERY    . LUMBAR WOUND DEBRIDEMENT N/A 08/03/2016   Procedure: IRRIGATION AND DEBRIDEMENT LUMBAR WOUND;  Surgeon: Eustace Moore, MD;  Location: [facility];  Service: Neurosurgery;  Laterality: N/A;  . OVARIAN CYST SURGERY    . REPAIR OF LEFT URETER Left 40 YRS AGO  . ROTATOR CUFF REPAIR  2014  . TONSILLECTOMY    . TUBAL LIGATION      There were no vitals filed for this visit.  Pelvic Floor Physical Therapy Treatment Note  SCREENING  Changes in medications, allergies, or medical history?: none    SUBJECTIVE  Patient reports: Diarrhea seems to have been worse the last couple days. Ate Asparagus and Salmon and this morning had a bel vita with bad results.  She is feeling good pain-wise. Walking within the hall but not outside due to the heat. She is walking down the hall and back 2 times (30 ft. Each direction).    Precautions:  Abdominal hysterectomy, Lymphedema (diagnosed ~ 3 years ago) (needs to be wearing pressure boots) L4-5 fusion in 2017. Her cervical traction came in and it has helped. Diverticulosis in 2015  Location of pain: LB/R hip (neck/shoulder) Current pain: 0/10  (2/10) Max pain: 4/10 (5/10) Least pain: 0/10 (2/10) Nature of pain:"grinding" sore/achy  **0/10 no increased pain following treatment   Patient Goals: Would like to get a handle on her diarrhea so it is not so liquid and she can control it better. Would like to be able to go out to shop or go to  church without fear of FI. Not have to change clothes more than 1 time per day due to leakage.  OBJECTIVE  Changes in: Posture/Observations:  Hyperlordosis/kyphosis, FHP, prominent C7, and R thoracic scoliosis. (from prior note)   Range of Motion/Flexibilty:   Strength/MMT:  4+ for knee EXT and flex, 4/5 for hip flex, DF, hip EXT R hip IR ~ 4/5 (from prior visit)  Pelvic Floor External Exam: (From previous session) Introitus Appears: mildly gaping Skin integrity: vaginal warts and reddened, moist skin. Palpation: TTP through B STP Cough: not assessed Prolapse visible?: no Scar mobility: decreased at 5 and 7 o'clock  Internal Vaginal Exam: Strength (PERF): 3/5, 10 sec, 1 time (~ 4/5 following release) Symmetry: similar B Palpation: TTP to all PFM internally with scar restriction at B posterior fourchette. Prolapse: mild posterior wall with bearing down, ~ 1 cm above introitus.   Internal Rectal Exam: Deferred indefinately Strength (PERF): Symmetry: Palpation: Prolapse:   Abdominal:  Pt. Demonstrated 100% consistency with coordination of deep-core with posterior pelvic tilt in seated following manual/DN treatment.   Palpation:   Gait Analysis: Following treatment, Pt. Taking ~ 50% longer steps without trendelenburg, FHP still present. (from prior session)  TODAY: Pt. Able to take ~ 1 foot long strides while focusing on bringing the knee and toes up and landing heel-toe. Walked 157 Ft. With cues.  INTERVENTIONS THIS SESSION:   Self-care: Educated on the importance of doing her lymphedema ICD's 2x/day to decrease fluid retention and decrease likelihood of her having SUI at night/in the morning. Reviewed the importance of Pt. Increasing her activity levels and determined that more AMB could be fit in in the early evenings when it cools off with the RW.   Gait-training: Pt. ABM 157 Ft. with TC and VC to engage Glute Med. With WB side as she lifts the knee and the toe  and practices landing heel-toe to improve gait mechanics and allow for improved movement patterns to help decrease muscular imbalances and decrease risk of falling. She needed 3 standing rest breaks to maintain appropriate body mechanics. Posture and hip-flexion height suffer with fatigue.   Therex: reviewed and practiced standing hip EXT and ABD 2x7 and hip flexion marching in place 2x10 on each side with TC and VC for slower, controlled motion and use of exhale to generate increased force especially on L>R to improve hip strength and stability and allow for improved balance and decreased falls risk.     Total time: 60                                   PT Short Term Goals -  05/24/20 1342      PT SHORT TERM GOAL #1   Title Patient will demonstrate functional recruitment of TA with breathing, sit-to-stand, squatting/lifting, and walking to allow for improved pelvic brace coordination, improved balance, and decreased downward pressure on the pelvic organs.    Baseline Pt. demonstrates dysfunctional breathing and poor TA recruitment/awareness.    Time 5    Period Weeks    Status Achieved    Target Date 03/10/20      PT SHORT TERM GOAL #2   Title Patient will demonstrate improved pelvic alignment and balance of musculature surrounding the pelvis to facilitate decreased PFM spasms and decrease pelvic pain.    Baseline R anterior/L posterior pelvic obliquity and R thoracic scoliotic curve. As of 6/23: is ~ 50% better. and is feeling like she has better balance.    Time 10    Period Weeks    Status Achieved    Target Date 04/14/20      PT SHORT TERM GOAL #3   Title Patient will demonstrate HEP x1 in the clinic to demonstrate understanding and proper form to allow for further improvement.    Baseline Pt. lacks knowledge of which therapeutic exercises can help decrease her pain/Sx.    Time 5    Period Weeks    Status Achieved    Target Date 03/10/20      PT  SHORT TERM GOAL #4   Title Pt. will demonstrate implementation of behavioral modifications such as fluid intake and use of Urge suppression technique to allow for decreased UUI/frequency.    Baseline Pt. having UUI daily, drinking a lot of Mtn. Dew. As of 7/12: is drinking some ~ 8 oz of water, ~ 4-5 cups of crystal light, ~ 1-2 decaffinated Mtn. Dew. and occasional glass of milk and has noticed decreased Urinary incontinece.    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    Baseline Pt. having to change clothes 3 times per day and does not eat if leaving the house due to [place]. As of 6/23: Pt. only changing 1-2 times per day but still does not feel like she can eat if she has to leave the house. as of 7/12: No change n frequency. As of    Time 20    Period Weeks    Status On-going    Target Date 06/23/20      PT LONG TERM GOAL #2   Title Patient will describe pain no greater than 3/10 during standing or walking up to 30 min. to demonstrate improved functional ability and allow for return to church.    Baseline pain increases to 6/10 (enough to warrant resting) after 15 min. of standing or walking. As of 6/23: pain 4/10 at greatest with ADL's but has not walked for exercise yet. As of 7/12: has not had pain > 4/10 without brace and able to walk with brace into PT session withot pain. As of 8/23: Pain at 7/10 at higest, worse this week than it had been (since UTI)  Time 10    Period Weeks    Status On-going    Target Date 06/23/20      PT LONG TERM GOAL #3   Title Pt. will improve in FOTO score by 15 points in each category to demonstrate improved function.     Baseline FOTO PFDI Prolapse: 54 PFDI Bowel: 63, Urinary Problem: 44, Bowel Leakage 46, PFDI Urinary: 54, PFDI Pain 46. As of 7/13: FOTO PFDI Prolapse:21 PFDI Bowel: 33, Urinary Problem: 39, Bowel Leakage 54, PFDI Urinary: 42, PFDI Pain 0. As of 05/24/20: FOTO PFDI Prolapse:21 PFDI Bowel: 46, Urinary Problem: 21, Bowel Leakage 54, PFDI Urinary: 54, PFDI Pain 29    Time 20    Period Weeks    Status On-going    Target Date 06/23/20                 Plan - 05/31/20 1438    Clinical Impression Statement Pt. Responded well to all interventions today, demonstrating improved body awareness, hip flexion height, eccentric control of the hip and foot, as well as understanding and correct performance of all education and exercises provided today. They will continue to benefit from skilled physical therapy to work toward remaining goals and maximize function as well as decrease likelihood of symptom increase or recurrence.    PT Next Visit Plan Further TP release internally on R, Try mini-squats, further thoracic mobility, review hip-flexor stretch, side-stretch, LB stretch, standing pelvic tilts, weight shifts and add kegels to urge-suppression if appropriate. Assess internally when appropriate    PT Home Exercise Plan soda-can theory, belly breathing, urge suppression (breathing only), sit-to-stand with exhale, supine-to-sit, pelvic tilt with obliques, PFM and TA, hook-lying pelvic tilts, R hip EXT/heel press, heel-lift for LLE, hip IR with yellow band, scapular retraction, chin-tucks. weight-shift balances, supine chin-tucks, hip-flexor stretch, low back stretch, side-stretch, standing hip EXT/ABD, Kegels with ~ 5-10 sec. hold, modified child's pose, psillium husk at night, walking 3x per day (150 ft), lumbar rotations in hook-lying    Consulted and Agree with Plan of Care Patient           Patient will benefit from skilled therapeutic intervention in order to improve the following deficits and  impairments:     Visit Diagnosis: Other idiopathic scoliosis, thoracolumbar region  Other muscle spasm  Sacrococcygeal disorders, not elsewhere classified  Other lack of coordination     Problem List Patient Active Problem List   Diagnosis Date Noted  . Leukocytes in urine 05/11/2020  . Restless legs 10/24/2018  . 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 GERD (gastroesophageal reflux disease) 03/20/2018  . Lymphedema 12/25/2017  . Insomnia 12/18/2017  . Lung mass 03/13/2017  . Coin lesion 03/13/2017  . Advanced care planning/counseling discussion 03/13/2017  . Back pain at L4-L5 level 01/01/2017  . Multiple pulmonary nodules 01/01/2017  . Incidental pulmonary nodule, greater than or equal to 88mm 01/01/2017  . Bilateral leg edema 09/19/2016  . Normocytic normochromic anemia 08/03/2016  . Spondylolisthesis at L4-L5 level 07/10/2016  . Spinal stenosis of lumbar region with radiculopathy 04/27/2016  . Anxiety 04/27/2016  . Trochanteric bursitis of right hip 10/08/2015  . Hypertension 03/29/2015  . Hypercholesteremia 03/29/2015  . BMI 45.0-49.9, adult ([place]) 03/29/2015  . Arthralgia of hands, bilateral 03/29/2015   Willa Rough DPT, ATC Willa Rough 05/31/2020, 2:39 PM  [place] MAIN [facility] SERVICES [address] [place], [place], [address] Phone: [phone]   Fax:  [phone]  Name:  Tracy Espinoza MRN: [id number] Date of Birth: 1945-11-13

## 2020-06-01 ENCOUNTER — Ambulatory Visit: Payer: Medicare Other

## 2020-06-01 DIAGNOSIS — H43813 Vitreous degeneration, bilateral: Secondary | ICD-10-CM | POA: Diagnosis not present

## 2020-06-02 ENCOUNTER — Ambulatory Visit (INDEPENDENT_AMBULATORY_CARE_PROVIDER_SITE_OTHER): Payer: Medicare Other | Admitting: Dermatology

## 2020-06-02 ENCOUNTER — Other Ambulatory Visit: Payer: Self-pay

## 2020-06-02 ENCOUNTER — Encounter: Payer: Self-pay | Admitting: Dermatology

## 2020-06-02 DIAGNOSIS — D229 Melanocytic nevi, unspecified: Secondary | ICD-10-CM | POA: Diagnosis not present

## 2020-06-02 DIAGNOSIS — I890 Lymphedema, not elsewhere classified: Secondary | ICD-10-CM | POA: Diagnosis not present

## 2020-06-02 DIAGNOSIS — M6740 Ganglion, unspecified site: Secondary | ICD-10-CM

## 2020-06-02 DIAGNOSIS — L578 Other skin changes due to chronic exposure to nonionizing radiation: Secondary | ICD-10-CM | POA: Diagnosis not present

## 2020-06-02 DIAGNOSIS — D1800 Hemangioma unspecified site: Secondary | ICD-10-CM | POA: Diagnosis not present

## 2020-06-02 DIAGNOSIS — L570 Actinic keratosis: Secondary | ICD-10-CM

## 2020-06-02 DIAGNOSIS — Z85828 Personal history of other malignant neoplasm of skin: Secondary | ICD-10-CM

## 2020-06-02 DIAGNOSIS — Z1283 Encounter for screening for malignant neoplasm of skin: Secondary | ICD-10-CM

## 2020-06-02 DIAGNOSIS — D239 Other benign neoplasm of skin, unspecified: Secondary | ICD-10-CM

## 2020-06-02 DIAGNOSIS — L814 Other melanin hyperpigmentation: Secondary | ICD-10-CM | POA: Diagnosis not present

## 2020-06-02 DIAGNOSIS — L821 Other seborrheic keratosis: Secondary | ICD-10-CM | POA: Diagnosis not present

## 2020-06-02 DIAGNOSIS — L820 Inflamed seborrheic keratosis: Secondary | ICD-10-CM | POA: Diagnosis not present

## 2020-06-02 DIAGNOSIS — D2361 Other benign neoplasm of skin of right upper limb, including shoulder: Secondary | ICD-10-CM

## 2020-06-02 DIAGNOSIS — M67431 Ganglion, right wrist: Secondary | ICD-10-CM | POA: Diagnosis not present

## 2020-06-02 NOTE — Progress Notes (Deleted)
   Follow-Up Visit   Subjective  Tracy Espinoza is a 74 y.o. female who presents for the following: Annual Exam (1 year f/u TBSE, Hx of BCC ) and Actinic Keratosis (check a scaly place on the R forehead).  ***  The following portions of the chart were reviewed this encounter and updated as appropriate:      Review of Systems:  No other skin or systemic complaints except as noted in HPI or Assessment and Plan.  Objective  Well appearing patient in no apparent distress; mood and affect are within normal limits.  {[id number]::"X full examination was performed including scalp, head, eyes, ears, nose, lips, neck, chest, axillae, abdomen, back, buttocks, bilateral upper extremities, bilateral lower extremities, hands, feet, fingers, toes, fingernails, and toenails. All findings within normal limits unless otherwise noted below."}  Objective  Left lateral supraclavicular: Well healed scar with no evidence of recurrence.   Objective  R forehead: Erythematous keratotic or waxy stuck-on papule or plaque.   Objective  dorsum nose: Erythematous thin papules/macules with gritty scale.   Objective  Right Forearm - Anterior: Firm pink/brown papulenodule with dimple sign.   Objective  Right dorsum wrist: 0.4 cm     Assessment & Plan  History of basal cell carcinoma (BCC) Left lateral supraclavicular  Clear. Observe for recurrence. Call clinic for new or changing lesions.  Recommend regular skin exams, daily broad-spectrum spf 30+ sunscreen use, and photoprotection.     Inflamed seborrheic keratosis R forehead  AK (actinic keratosis) dorsum nose  Dermatofibroma Right Forearm - Anterior  Ganglion cyst Right dorsum wrist  Ganglion cyst vs Lipoma   Observe    No follow-ups on file.

## 2020-06-02 NOTE — Patient Instructions (Signed)

## 2020-06-02 NOTE — Progress Notes (Signed)
Follow-Up Visit   Subjective  Tracy Espinoza is a 74 y.o. female who presents for the following: Annual Exam (1 year f/u TBSE, Hx of BCC ) and Actinic Keratosis (check a scaly place on the R forehead). The patient presents for Total-Body Skin Exam (TBSE) for skin cancer screening and mole check.  The following portions of the chart were reviewed this encounter and updated as appropriate:  Tobacco  Allergies  Meds  Problems  Med Hx  Surg Hx  Fam Hx     Review of Systems:  No other skin or systemic complaints except as noted in HPI or Assessment and Plan.  Objective  Well appearing patient in no apparent distress; mood and affect are within normal limits.  A full examination was performed including scalp, head, eyes, ears, nose, lips, neck, chest, axillae, abdomen, back, buttocks, bilateral upper extremities, bilateral lower extremities, hands, feet, fingers, toes, fingernails, and toenails. All findings within normal limits unless otherwise noted below.  Objective  Left lateral supraclavicular: Well healed scar with no evidence of recurrence.   Objective  R forehead: Erythematous keratotic or waxy stuck-on papule or plaque.   Objective  dorsum nose: Erythematous thin papules/macules with gritty scale.   Objective  Right Forearm - Anterior: Firm pink/brown papulenodule with dimple sign.   Objective  Right dorsum wrist: 0.4 cm Subcutaneous nodule.   Objective  Lower legs: Edema    Assessment & Plan  History of basal cell carcinoma (BCC) Left lateral supraclavicular Clear. Observe for recurrence. Call clinic for new or changing lesions.  Recommend regular skin exams, daily broad-spectrum spf 30+ sunscreen use, and photoprotection.     Inflamed seborrheic keratosis R forehead  Destruction of lesion - R forehead Complexity: simple   Destruction method: cryotherapy   Informed consent: discussed and consent obtained   Timeout:  patient name, date of birth,  surgical site, and procedure verified Lesion destroyed using liquid nitrogen: Yes   Region frozen until ice ball extended beyond lesion: Yes   Outcome: patient tolerated procedure well with no complications   Post-procedure details: wound care instructions given    AK (actinic keratosis) dorsum nose  Destruction of lesion - dorsum nose Complexity: simple   Destruction method: cryotherapy   Informed consent: discussed and consent obtained   Timeout:  patient name, date of birth, surgical site, and procedure verified Lesion destroyed using liquid nitrogen: Yes   Region frozen until ice ball extended beyond lesion: Yes   Outcome: patient tolerated procedure well with no complications   Post-procedure details: wound care instructions given    Dermatofibroma Right Forearm - Anterior Benign-appearing.  Observation.  Call clinic for new or changing moles.  Recommend daily use of broad spectrum spf 30+ sunscreen to sun-exposed areas.   Ganglion cyst Right dorsum wrist Ganglion cyst vs Lipoma  Benign-appearing Observe   Lymphedema Lower legs Pt sees Dr Lucky Cowboy for treatment  Cont wearing graduated compressions and Pneumatic compression   Lentigines - Scattered tan macules - Discussed due to sun exposure - Benign, observe - Call for any changes  Seborrheic Keratoses - Stuck-on, waxy, tan-brown papules and plaques  - Discussed benign etiology and prognosis. - Observe - Call for any changes  Melanocytic Nevi - Tan-brown and/or pink-flesh-colored symmetric macules and papules - Benign appearing on exam today - Observation - Call clinic for new or changing moles - Recommend daily use of broad spectrum spf 30+ sunscreen to sun-exposed areas.   Hemangiomas - Red papules - Discussed benign  nature - Observe - Call for any changes  Actinic Damage - diffuse scaly erythematous macules with underlying dyspigmentation - Recommend daily broad spectrum sunscreen SPF 30+ to sun-exposed  areas, reapply every 2 hours as needed.  - Call for new or changing lesions.  Skin cancer screening performed today.  Return in about 1 year (around 06/02/2021).  IMarye Round, CMA, am acting as scribe for Sarina Ser, MD .  Documentation: I have reviewed the above documentation for accuracy and completeness, and I agree with the above.  Sarina Ser, MD

## 2020-06-03 ENCOUNTER — Ambulatory Visit: Payer: Medicare Other | Attending: Gastroenterology

## 2020-06-03 DIAGNOSIS — R278 Other lack of coordination: Secondary | ICD-10-CM | POA: Diagnosis not present

## 2020-06-03 DIAGNOSIS — M62838 Other muscle spasm: Secondary | ICD-10-CM | POA: Insufficient documentation

## 2020-06-03 DIAGNOSIS — M4125 Other idiopathic scoliosis, thoracolumbar region: Secondary | ICD-10-CM | POA: Insufficient documentation

## 2020-06-03 DIAGNOSIS — M533 Sacrococcygeal disorders, not elsewhere classified: Secondary | ICD-10-CM | POA: Diagnosis not present

## 2020-06-03 NOTE — Therapy (Signed)
[place] MAIN [facility] SERVICES [address] [place], [place], [address] Phone: [phone]   Fax:  [phone]  Physical Therapy Treatment  The patient has been informed of current processes in place at [facility]patient [facility] to protect patients from Covid-19 exposure including social distancing, schedule modifications, and new cleaning procedures. After discussing their particular risk with a therapist based on the patient's personal risk factors, the patient has decided to proceed with in-person therapy.   Patient Details  Name: Tracy Espinoza MRN: [id number] Date of Birth: August 23, 1946 No data recorded  Encounter Date: 06/03/2020   PT End of Session - 06/03/20 1336    Visit Number 28    Number of Visits 35    Date for PT Re-Evaluation 06/23/20    Authorization Type Medicare    Authorization Time Period 7/12 through 06/21/20    Authorization - Visit Number 13    Authorization - Number of Visits 20    Progress Note Due on Visit 20    PT Start Time 1330    PT Stop Time 1430    PT Time Calculation (min) 60 min    Activity Tolerance Patient tolerated treatment well;No increased pain    Behavior During Therapy WFL for tasks assessed/performed           Past Medical History:  Diagnosis Date  . Anxiety   . Arthritis   . Basal cell carcinoma 2019   L lateral sup clavicle   . Cancer ([place])    BASAL CELL SKIN CANCERS REMOVED  . Depression   . Edema    FEET/LEGS  . GERD (gastroesophageal reflux disease)   . Heart murmur   . History of hiatal hernia   . Hypertension   . Lymphedema   . MRSA (methicillin resistant staph aureus) culture positive    H/O    Past Surgical History:  Procedure Laterality Date  . ABDOMINAL HYSTERECTOMY    . APPENDECTOMY    . BACK SURGERY    . CATARACT EXTRACTION W/PHACO Left 03/28/2018   Procedure: CATARACT EXTRACTION PHACO AND INTRAOCULAR LENS PLACEMENT (IOC);  Surgeon: Eulogio Bear, MD;  Location: [facility] OR[facility];   Service: Ophthalmology;  Laterality: Left;  [place] 00:28.2 AP% 7.9 CDE 2.21 Fluid pack lot # [id number]  . CATARACT EXTRACTION W/PHACO Right 05/16/2018   Procedure: CATARACT EXTRACTION PHACO AND INTRAOCULAR LENS PLACEMENT (IOC);  Surgeon: Eulogio Bear, MD;  Location: [facility] OR[facility];  Service: Ophthalmology;  Laterality: Right;  [place] 00:26.2 AP% 7.6 CDE 2.01 Fluid pack lot # [id number]  . CHOLECYSTECTOMY    . COLONOSCOPY    . FRACTURE SURGERY     BONE ON SIDE OF RIGHT FOOT  . HERNIA REPAIR     UMBILICAL X 2  . JOINT REPLACEMENT Bilateral    knees  . KIDNEY SURGERY    . LUMBAR WOUND DEBRIDEMENT N/A 08/03/2016   Procedure: IRRIGATION AND DEBRIDEMENT LUMBAR WOUND;  Surgeon: Eustace Moore, MD;  Location: [place];  Service: Neurosurgery;  Laterality: N/A;  . OVARIAN CYST SURGERY    . REPAIR OF LEFT URETER Left 40 YRS AGO  . ROTATOR CUFF REPAIR  2014  . TONSILLECTOMY    . TUBAL LIGATION      There were no vitals filed for this visit.  Pelvic Floor Physical Therapy Treatment Note  SCREENING  Changes in medications, allergies, or medical history?: none    SUBJECTIVE  Patient reports: Has noticed that she has been doing a lot better with  leakage when she does her "pressure boots" before bed but yesterday was not quite as good because she did not wear her compression stockings that morning and had a lot more leg swelling and discomfort. She does her neck traction once a day and finds that the relief lasts until some time the next day. Has been getting her walking in with all her appointments this week but did a couple loops around the house earlier today. On Monday she had a little pressure but was able to get her car washed and go through the drive-through bakery and get home but had an accident upon standing. similar situation yesterday 2 times. BM's are still green and yellow.   Precautions:  Abdominal hysterectomy, Lymphedema (diagnosed ~ 3 years ago) (needs to be wearing pressure boots) L4-5  fusion in 2017. Her cervical traction came in and it has helped. Diverticulosis in 2015  Location of pain: LB/R hip (neck/shoulder) Current pain: 2/10  (4/10) Max pain: 7/10 (6/10) Least pain: 0/10 (0/10) Nature of pain:"grinding" sore/achy  **0/10  pain following in LB/Hip following treatment   Patient Goals: Would like to get a handle on her diarrhea so it is not so liquid and she can control it better. Would like to be able to go out to shop or go to church without fear of FI. Not have to change clothes more than 1 time per day due to leakage.  OBJECTIVE  Changes in: Posture/Observations:  Hyperlordosis/kyphosis, FHP, prominent C7, and R thoracic scoliosis. (from prior note)   Range of Motion/Flexibilty:   Strength/MMT:  4+ for knee EXT and flex, 4/5 for hip flex, DF, hip EXT R hip IR ~ 4/5 (from prior visit)  Pelvic Floor External Exam: (From previous session) Introitus Appears: mildly gaping Skin integrity: vaginal warts and reddened, moist skin. Palpation: TTP through B STP Cough: not assessed Prolapse visible?: no Scar mobility: decreased at 5 and 7 o'clock  Internal Vaginal Exam: Strength (PERF): 3/5, 10 sec, 1 time (~ 4/5 following release) Symmetry: similar B Palpation: TTP to all PFM internally with scar restriction at B posterior fourchette. Prolapse: mild posterior wall with bearing down, ~ 1 cm above introitus.  TODAY: TTP to R OI/IC and decreased scar mobility/some TTP at B posterior fourchette.  Internal Rectal Exam: Deferred indefinately Strength (PERF): Symmetry: Palpation: Prolapse:   Abdominal:  Pt. Demonstrated 100% consistency with coordination of deep-core with posterior pelvic tilt in seated following manual/DN treatment.   Palpation:   Gait Analysis: Following treatment, Pt. Taking ~ 50% longer steps without trendelenburg, FHP still present. (from prior session)  TODAY: Pt. Able to take ~ 1 foot long strides while focusing on  bringing the knee and toes up and landing heel-toe. Walked 157 Ft. With cues.  INTERVENTIONS THIS SESSION:   Manual: Performed TP release and scar release to B posteror fourchette and to R OI and IC to decrease spasm and pain and allow for improved balance of musculature for improved function and decreased symptoms. Reviewed and gave cue to exhale while doing Kegel to improve recruitment/coordination.   Total time: 60                              PT Short Term Goals - 05/24/20 1342      PT SHORT TERM GOAL #1   Title Patient will demonstrate functional recruitment of TA with breathing, sit-to-stand, squatting/lifting, and walking to allow for improved pelvic brace coordination, improved  balance, and decreased downward pressure on the pelvic organs.    Baseline Pt. demonstrates dysfunctional breathing and poor TA recruitment/awareness.    Time 5    Period Weeks    Status Achieved    Target Date 03/10/20      PT SHORT TERM GOAL #2   Title Patient will demonstrate improved pelvic alignment and balance of musculature surrounding the pelvis to facilitate decreased PFM spasms and decrease pelvic pain.    Baseline R anterior/L posterior pelvic obliquity and R thoracic scoliotic curve. As of 6/23: is ~ 50% better. and is feeling like she has better balance.    Time 10    Period Weeks    Status Achieved    Target Date 04/14/20      PT SHORT TERM GOAL #3   Title Patient will demonstrate HEP x1 in the clinic to demonstrate understanding and proper form to allow for further improvement.    Baseline Pt. lacks knowledge of which therapeutic exercises can help decrease her pain/Sx.    Time 5    Period Weeks    Status Achieved    Target Date 03/10/20      PT SHORT TERM GOAL #4   Title Pt. will demonstrate implementation of behavioral modifications such as fluid intake and use of Urge suppression technique to allow for decreased UUI/frequency.    Baseline Pt. having UUI  daily, drinking a lot of Mtn. Dew. As of 7/12: is drinking some ~ 8 oz of water, ~ 4-5 cups of crystal light, ~ 1-2 decaffinated Mtn. Dew. and occasional glass of milk and has noticed decreased Urinary incontinece.    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    Baseline Pt. having to change clothes 3 times per day and does not eat if leaving the house due to [place]. As of 6/23: Pt. only changing 1-2 times per day but still does not feel like she can eat if she has to leave the house. as of 7/12: No change n frequency. As of    Time 20    Period Weeks    Status On-going    Target Date 06/23/20      PT LONG TERM GOAL #2   Title Patient will describe pain no greater than 3/10 during standing or walking up to 30 min. to demonstrate improved functional ability and allow for return to church.    Baseline pain increases to 6/10 (enough to warrant resting) after 15 min. of standing or walking. As of 6/23: pain 4/10 at greatest with ADL's but has not walked for exercise yet. As of 7/12: has not had pain > 4/10 without brace and able to walk with brace into PT session withot pain. As of 8/23: Pain at 7/10 at higest, worse this week than it had been (since UTI)    Time 10    Period Weeks    Status On-going    Target Date 06/23/20      PT LONG TERM GOAL #3   Title Pt. will  improve in FOTO score by 15 points in each category to demonstrate improved function.    Baseline FOTO PFDI Prolapse: 54 PFDI Bowel: 63, Urinary Problem: 44, Bowel Leakage 46, PFDI Urinary: 54, PFDI Pain 46. As of 7/13: FOTO PFDI Prolapse:21 PFDI Bowel: 33, Urinary Problem: 39, Bowel Leakage 54, PFDI Urinary:  42, PFDI Pain 0. As of 05/24/20: FOTO PFDI Prolapse:21 PFDI Bowel: 46, Urinary Problem: 21, Bowel Leakage 54, PFDI Urinary: 54, PFDI Pain 29    Time 20    Period Weeks    Status On-going    Target Date 06/23/20                 Plan - 06/03/20 1703    Clinical Impression Statement Pt. Responded well to all interventions today, demonstrating decreased PFM spasm and improved recruitment/strength as well as decreased LBP and understanding and correct performance of all education and exercises provided today. They will continue to benefit from skilled physical therapy to work toward remaining goals and maximize function as well as decrease likelihood of symptom increase or recurrence.    PT Next Visit Plan Further TP release internally B, Try mini-squats, further thoracic mobility, scapular strengthening, review hip-flexor stretch, side-stretch, LB stretch, standing pelvic tilts, weight shifts and add kegels to urge-suppression if appropriate. Assess internally when appropriate    PT Home Exercise Plan soda-can theory, belly breathing, urge suppression (breathing only), sit-to-stand with exhale, supine-to-sit, pelvic tilt with obliques, PFM and TA, hook-lying pelvic tilts, R hip EXT/heel press, heel-lift for LLE, hip IR with yellow band, scapular retraction, chin-tucks. weight-shift balances, supine chin-tucks, hip-flexor stretch, low back stretch, side-stretch, standing hip EXT/ABD, Kegels with ~ 5-10 sec. hold, modified child's pose, psillium husk at night, walking 3x per day (150 ft), lumbar rotations in hook-lying    Consulted and Agree with Plan of Care Patient           Patient will benefit from skilled therapeutic intervention in order to improve the following deficits and impairments:     Visit Diagnosis: Other idiopathic scoliosis, thoracolumbar region  Other muscle spasm  Sacrococcygeal disorders, not elsewhere classified  Other lack of coordination     Problem  List Patient Active Problem List   Diagnosis Date Noted  . Leukocytes in urine 05/11/2020  . Restless legs 10/24/2018  . 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 GERD (gastroesophageal reflux disease) 03/20/2018  . Lymphedema 12/25/2017  . Insomnia 12/18/2017  . Lung mass 03/13/2017  . Coin lesion 03/13/2017  . Advanced care planning/counseling discussion 03/13/2017  . Back pain at L4-L5 level 01/01/2017  . Multiple pulmonary nodules 01/01/2017  . Incidental pulmonary nodule, greater than or equal to 22mm 01/01/2017  . Bilateral leg edema 09/19/2016  . Normocytic normochromic anemia 08/03/2016  . Spondylolisthesis at L4-L5 level 07/10/2016  . Spinal stenosis of lumbar region with radiculopathy 04/27/2016  . Anxiety 04/27/2016  . Trochanteric bursitis of right hip 10/08/2015  . Hypertension 03/29/2015  . Hypercholesteremia 03/29/2015  . BMI 45.0-49.9, adult ([place]) 03/29/2015  . Arthralgia of hands, bilateral 03/29/2015   Willa Rough DPT, ATC Willa Rough 06/03/2020, 5:06 PM  [place] MAIN [facility] SERVICES [address] [place], [place], [address] Phone: [phone]   Fax:  [phone]  Name: Tracy Espinoza MRN: [id number] Date of Birth: 1946/01/16

## 2020-06-07 ENCOUNTER — Encounter: Payer: Self-pay | Admitting: Dermatology

## 2020-06-10 ENCOUNTER — Ambulatory Visit: Payer: Medicare Other

## 2020-06-10 ENCOUNTER — Other Ambulatory Visit: Payer: Self-pay

## 2020-06-10 DIAGNOSIS — R278 Other lack of coordination: Secondary | ICD-10-CM | POA: Diagnosis not present

## 2020-06-10 DIAGNOSIS — M62838 Other muscle spasm: Secondary | ICD-10-CM

## 2020-06-10 DIAGNOSIS — M4125 Other idiopathic scoliosis, thoracolumbar region: Secondary | ICD-10-CM

## 2020-06-10 DIAGNOSIS — M533 Sacrococcygeal disorders, not elsewhere classified: Secondary | ICD-10-CM

## 2020-06-10 DIAGNOSIS — Z23 Encounter for immunization: Secondary | ICD-10-CM | POA: Diagnosis not present
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
## 2020-06-14 ENCOUNTER — Ambulatory Visit: Payer: Medicare Other

## 2020-06-16 ENCOUNTER — Other Ambulatory Visit: Payer: Self-pay | Admitting: Family Medicine

## 2020-06-16 ENCOUNTER — Telehealth: Payer: Self-pay

## 2020-06-16 NOTE — Telephone Encounter (Signed)
That's fine

## 2020-06-16 NOTE — Telephone Encounter (Signed)
[facility] pharmacy notified.

## 2020-06-16 NOTE — Telephone Encounter (Signed)
Requested Prescriptions  Pending Prescriptions Disp Refills  . sertraline (ZOLOFT) 50 MG tablet [Pharmacy Med Name: SERTRALINE HCL 50 MG TABLET] 90 tablet 1    Sig: TAKE 1 TABLET BY MOUTH EVERY DAY     Psychiatry:  Antidepressants - SSRI Passed - 06/16/2020  2:24 AM      Passed - Valid encounter within last 6 months    Recent Outpatient Visits          1 month ago Leukocytes in urine   [facility] Eulogio Bear, NP   3 months ago [place], [place]   4 months ago Diarrhea, unspecified type   [place], [place], [place]   5 months ago Diarrhea, unspecified type   [facility], [place], [place]   6 months ago Diarrhea, unspecified type   [facility], Lilia Argue, PA-C      Future Appointments            In 1 month [place], Lennette Bihari, MD [facility]   In 1 month [facility] [facility], Barbaraann Faster, NP [employer], [place]   In 10 months  [employer], [place]

## 2020-06-16 NOTE — Telephone Encounter (Signed)
[facility] pharmacy called for RX clarification. This is the original Rx sent in.  phenazopyridine (PYRIDIUM) 97 MG tablet 10 tablet w/ 3 refills   Sig - Route: Take 1 tablet (97 mg total) by mouth 3 (three) times daily as needed for pain (urinary pain). - Oral  Sent to pharmacy as: phenazopyridine (PYRIDIUM) 97 MG tablet  E-Prescribing Status: Receipt confirmed by pharmacy (05/11/2020 2:58 PM EDT)   The pharmacist stated there is not 97 mg tablet but 100 mg He would like to know if it fine to change the Rx from 97 mg to 100 mg  Please advise

## 2020-06-17 ENCOUNTER — Inpatient Hospital Stay
Admission: EM | Admit: 2020-06-17 | Discharge: 2020-06-22 | DRG: 689 | Disposition: A | Discharge status: Skilled Nursing Facility | Payer: Medicare Other | Source: Ambulatory Visit | Attending: Internal Medicine | Admitting: Internal Medicine

## 2020-06-17 ENCOUNTER — Encounter: Payer: Self-pay | Admitting: Family Medicine

## 2020-06-17 ENCOUNTER — Ambulatory Visit: Payer: Medicare Other

## 2020-06-17 ENCOUNTER — Ambulatory Visit (INDEPENDENT_AMBULATORY_CARE_PROVIDER_SITE_OTHER): Payer: Medicare Other | Admitting: Family Medicine

## 2020-06-17 ENCOUNTER — Encounter: Payer: Self-pay | Admitting: Emergency Medicine

## 2020-06-17 ENCOUNTER — Inpatient Hospital Stay: Payer: Medicare Other

## 2020-06-17 ENCOUNTER — Emergency Department: Payer: Medicare Other

## 2020-06-17 ENCOUNTER — Other Ambulatory Visit: Payer: Self-pay

## 2020-06-17 VITALS — Ht 61.0 in | Wt 269.0 lb

## 2020-06-17 DIAGNOSIS — J9601 Acute respiratory failure with hypoxia: Secondary | ICD-10-CM | POA: Diagnosis not present

## 2020-06-17 DIAGNOSIS — G8929 Other chronic pain: Secondary | ICD-10-CM | POA: Diagnosis present

## 2020-06-17 DIAGNOSIS — Z91041 Radiographic dye allergy status: Secondary | ICD-10-CM | POA: Diagnosis not present

## 2020-06-17 DIAGNOSIS — J439 Emphysema, unspecified: Secondary | ICD-10-CM | POA: Diagnosis not present

## 2020-06-17 DIAGNOSIS — M549 Dorsalgia, unspecified: Secondary | ICD-10-CM | POA: Diagnosis present

## 2020-06-17 DIAGNOSIS — F329 Major depressive disorder, single episode, unspecified: Secondary | ICD-10-CM | POA: Diagnosis present

## 2020-06-17 DIAGNOSIS — S50312A Abrasion of left elbow, initial encounter: Secondary | ICD-10-CM | POA: Diagnosis present

## 2020-06-17 DIAGNOSIS — Z8614 Personal history of Methicillin resistant Staphylococcus aureus infection: Secondary | ICD-10-CM | POA: Diagnosis not present

## 2020-06-17 DIAGNOSIS — R0602 Shortness of breath: Secondary | ICD-10-CM

## 2020-06-17 DIAGNOSIS — E669 Obesity, unspecified: Secondary | ICD-10-CM | POA: Diagnosis present

## 2020-06-17 DIAGNOSIS — Z85828 Personal history of other malignant neoplasm of skin: Secondary | ICD-10-CM

## 2020-06-17 DIAGNOSIS — N281 Cyst of kidney, acquired: Secondary | ICD-10-CM | POA: Diagnosis not present

## 2020-06-17 DIAGNOSIS — N261 Atrophy of kidney (terminal): Secondary | ICD-10-CM | POA: Diagnosis not present

## 2020-06-17 DIAGNOSIS — N179 Acute kidney failure, unspecified: Secondary | ICD-10-CM | POA: Diagnosis present

## 2020-06-17 DIAGNOSIS — R6889 Other general symptoms and signs: Secondary | ICD-10-CM | POA: Diagnosis not present

## 2020-06-17 DIAGNOSIS — R279 Unspecified lack of coordination: Secondary | ICD-10-CM | POA: Diagnosis not present

## 2020-06-17 DIAGNOSIS — I10 Essential (primary) hypertension: Secondary | ICD-10-CM | POA: Diagnosis not present

## 2020-06-17 DIAGNOSIS — F419 Anxiety disorder, unspecified: Secondary | ICD-10-CM | POA: Diagnosis present

## 2020-06-17 DIAGNOSIS — Y92531 Health care provider office as the place of occurrence of the external cause: Secondary | ICD-10-CM

## 2020-06-17 DIAGNOSIS — Z881 Allergy status to other antibiotic agents status: Secondary | ICD-10-CM

## 2020-06-17 DIAGNOSIS — Z8249 Family history of ischemic heart disease and other diseases of the circulatory system: Secondary | ICD-10-CM

## 2020-06-17 DIAGNOSIS — R0902 Hypoxemia: Secondary | ICD-10-CM | POA: Diagnosis present

## 2020-06-17 DIAGNOSIS — Z6841 Body Mass Index (BMI) 40.0 and over, adult: Secondary | ICD-10-CM | POA: Diagnosis not present

## 2020-06-17 DIAGNOSIS — Z7982 Long term (current) use of aspirin: Secondary | ICD-10-CM

## 2020-06-17 DIAGNOSIS — R300 Dysuria: Secondary | ICD-10-CM | POA: Diagnosis not present

## 2020-06-17 DIAGNOSIS — M48062 Spinal stenosis, lumbar region with neurogenic claudication: Secondary | ICD-10-CM | POA: Diagnosis not present

## 2020-06-17 DIAGNOSIS — Y9301 Activity, walking, marching and hiking: Secondary | ICD-10-CM | POA: Diagnosis present

## 2020-06-17 DIAGNOSIS — J189 Pneumonia, unspecified organism: Secondary | ICD-10-CM | POA: Diagnosis present

## 2020-06-17 DIAGNOSIS — R498 Other voice and resonance disorders: Secondary | ICD-10-CM | POA: Diagnosis not present

## 2020-06-17 DIAGNOSIS — Z808 Family history of malignant neoplasm of other organs or systems: Secondary | ICD-10-CM

## 2020-06-17 DIAGNOSIS — I890 Lymphedema, not elsewhere classified: Secondary | ICD-10-CM | POA: Diagnosis present

## 2020-06-17 DIAGNOSIS — N210 Calculus in bladder: Secondary | ICD-10-CM | POA: Diagnosis not present

## 2020-06-17 DIAGNOSIS — E876 Hypokalemia: Secondary | ICD-10-CM | POA: Diagnosis present

## 2020-06-17 DIAGNOSIS — G4700 Insomnia, unspecified: Secondary | ICD-10-CM | POA: Diagnosis not present

## 2020-06-17 DIAGNOSIS — Z823 Family history of stroke: Secondary | ICD-10-CM

## 2020-06-17 DIAGNOSIS — R5383 Other fatigue: Secondary | ICD-10-CM | POA: Diagnosis not present

## 2020-06-17 DIAGNOSIS — R0689 Other abnormalities of breathing: Secondary | ICD-10-CM | POA: Diagnosis not present

## 2020-06-17 DIAGNOSIS — Z79899 Other long term (current) drug therapy: Secondary | ICD-10-CM

## 2020-06-17 DIAGNOSIS — Z9049 Acquired absence of other specified parts of digestive tract: Secondary | ICD-10-CM | POA: Diagnosis not present

## 2020-06-17 DIAGNOSIS — M6281 Muscle weakness (generalized): Secondary | ICD-10-CM | POA: Diagnosis not present

## 2020-06-17 DIAGNOSIS — R1110 Vomiting, unspecified: Secondary | ICD-10-CM

## 2020-06-17 DIAGNOSIS — Z90710 Acquired absence of both cervix and uterus: Secondary | ICD-10-CM

## 2020-06-17 DIAGNOSIS — N12 Tubulo-interstitial nephritis, not specified as acute or chronic: Secondary | ICD-10-CM | POA: Diagnosis present

## 2020-06-17 DIAGNOSIS — Z20822 Contact with and (suspected) exposure to covid-19: Secondary | ICD-10-CM | POA: Diagnosis present

## 2020-06-17 DIAGNOSIS — N136 Pyonephrosis: Secondary | ICD-10-CM | POA: Diagnosis present

## 2020-06-17 DIAGNOSIS — K529 Noninfective gastroenteritis and colitis, unspecified: Secondary | ICD-10-CM | POA: Diagnosis present

## 2020-06-17 DIAGNOSIS — Z885 Allergy status to narcotic agent status: Secondary | ICD-10-CM

## 2020-06-17 DIAGNOSIS — I25810 Atherosclerosis of coronary artery bypass graft(s) without angina pectoris: Secondary | ICD-10-CM | POA: Diagnosis not present

## 2020-06-17 DIAGNOSIS — R5381 Other malaise: Secondary | ICD-10-CM | POA: Diagnosis not present

## 2020-06-17 DIAGNOSIS — Z820 Family history of epilepsy and other diseases of the nervous system: Secondary | ICD-10-CM

## 2020-06-17 DIAGNOSIS — K219 Gastro-esophageal reflux disease without esophagitis: Secondary | ICD-10-CM | POA: Diagnosis present

## 2020-06-17 DIAGNOSIS — N132 Hydronephrosis with renal and ureteral calculous obstruction: Secondary | ICD-10-CM | POA: Diagnosis not present

## 2020-06-17 DIAGNOSIS — B9620 Unspecified Escherichia coli [E. coli] as the cause of diseases classified elsewhere: Secondary | ICD-10-CM | POA: Diagnosis present

## 2020-06-17 DIAGNOSIS — Z9889 Other specified postprocedural states: Secondary | ICD-10-CM | POA: Diagnosis not present

## 2020-06-17 DIAGNOSIS — W1830XA Fall on same level, unspecified, initial encounter: Secondary | ICD-10-CM | POA: Diagnosis present

## 2020-06-17 DIAGNOSIS — F411 Generalized anxiety disorder: Secondary | ICD-10-CM | POA: Diagnosis not present

## 2020-06-17 DIAGNOSIS — I959 Hypotension, unspecified: Secondary | ICD-10-CM | POA: Diagnosis not present

## 2020-06-17 DIAGNOSIS — R112 Nausea with vomiting, unspecified: Secondary | ICD-10-CM | POA: Diagnosis not present

## 2020-06-17 DIAGNOSIS — R531 Weakness: Secondary | ICD-10-CM | POA: Diagnosis not present

## 2020-06-17 LAB — CBC
HCT: 42.3 % (ref 36.0–46.0)
Hemoglobin: 13.8 g/dL (ref 12.0–15.0)
MCH: 31.7 pg (ref 26.0–34.0)
MCHC: 32.6 g/dL (ref 30.0–36.0)
MCV: 97.2 fL (ref 80.0–100.0)
Platelets: 152 10*3/uL (ref 150–400)
RBC: 4.35 MIL/uL (ref 3.87–5.11)
RDW: 13.7 % (ref 11.5–15.5)
WBC: 18.6 10*3/uL — ABNORMAL HIGH (ref 4.0–10.5)
nRBC: 0 % (ref 0.0–0.2)

## 2020-06-17 LAB — COMPREHENSIVE METABOLIC PANEL
ALT: 13 U/L (ref 0–44)
AST: 25 U/L (ref 15–41)
Albumin: 3.8 g/dL (ref 3.5–5.0)
Alkaline Phosphatase: 34 U/L — ABNORMAL LOW (ref 38–126)
Anion gap: 11 (ref 5–15)
BUN: 41 mg/dL — ABNORMAL HIGH (ref 8–23)
CO2: 32 mmol/L (ref 22–32)
Calcium: 9.3 mg/dL (ref 8.9–10.3)
Chloride: 96 mmol/L — ABNORMAL LOW (ref 98–111)
Creatinine, Ser: 1.29 mg/dL — ABNORMAL HIGH (ref 0.44–1.00)
GFR calc Af Amer: 47 mL/min — ABNORMAL LOW (ref >60)
GFR calc non Af Amer: 40 mL/min — ABNORMAL LOW (ref >60)
Glucose, Bld: 139 mg/dL — ABNORMAL HIGH (ref 70–99)
Potassium: 3.2 mmol/L — ABNORMAL LOW (ref 3.5–5.1)
Sodium: 139 mmol/L (ref 135–145)
Total Bilirubin: 1.8 mg/dL — ABNORMAL HIGH (ref 0.3–1.2)
Total Protein: 7.1 g/dL (ref 6.5–8.1)

## 2020-06-17 LAB — URINALYSIS, ROUTINE W REFLEX MICROSCOPIC
Bilirubin Urine: NEGATIVE
Glucose, UA: NEGATIVE mg/dL
Ketones, ur: NEGATIVE mg/dL
Nitrite: POSITIVE — AB
Protein, ur: >=300 mg/dL — AB
RBC / HPF: >50 RBC/hpf — ABNORMAL HIGH (ref 0–5)
Specific Gravity, Urine: 1.014 (ref 1.005–1.030)
Squamous Epithelial / HPF: NONE SEEN (ref 0–5)
WBC, UA: >50 WBC/hpf — ABNORMAL HIGH (ref 0–5)
pH: 5 (ref 5.0–8.0)

## 2020-06-17 LAB — SARS CORONAVIRUS 2 BY RT PCR (HOSPITAL ORDER, PERFORMED IN ~~LOC~~ HOSPITAL LAB): SARS Coronavirus 2: NEGATIVE

## 2020-06-17 LAB — MAGNESIUM: Magnesium: 1.4 mg/dL — ABNORMAL LOW (ref 1.7–2.4)

## 2020-06-17 MED ORDER — ONDANSETRON HCL 4 MG/2ML IJ SOLN
4.0000 mg | Freq: Once | INTRAMUSCULAR | Status: AC
  Administered 2020-06-17: 4 mg via INTRAVENOUS
  Filled 2020-06-17: qty 2

## 2020-06-17 MED ORDER — SODIUM CHLORIDE 0.9 % IV SOLN
500.0000 mg | INTRAVENOUS | Status: AC
  Administered 2020-06-18 – 2020-06-19 (×2): 500 mg via INTRAVENOUS
  Filled 2020-06-17 (×2): qty 500

## 2020-06-17 MED ORDER — SODIUM CHLORIDE 0.9 % IV SOLN
2.0000 g | INTRAVENOUS | Status: DC
  Administered 2020-06-18 – 2020-06-22 (×5): 2 g via INTRAVENOUS
  Filled 2020-06-17: qty 2
  Filled 2020-06-17: qty 20
  Filled 2020-06-17: qty 2
  Filled 2020-06-17: qty 20
  Filled 2020-06-17: qty 2

## 2020-06-17 MED ORDER — ONDANSETRON HCL 4 MG/2ML IJ SOLN: 4.0000 mg | Freq: Four times a day (QID) | INTRAMUSCULAR | Status: DC | PRN

## 2020-06-17 MED ORDER — SODIUM CHLORIDE 0.9 % IV SOLN
2.0000 g | Freq: Once | INTRAVENOUS | Status: AC
  Administered 2020-06-17: 2 g via INTRAVENOUS
  Filled 2020-06-17: qty 20

## 2020-06-17 MED ORDER — ASPIRIN EC 81 MG PO TBEC
81.0000 mg | DELAYED_RELEASE_TABLET | Freq: Every day | ORAL | Status: DC
  Administered 2020-06-17 – 2020-06-22 (×6): 81 mg via ORAL
  Filled 2020-06-17 (×6): qty 1

## 2020-06-17 MED ORDER — MAGNESIUM SULFATE 2 GM/50ML IV SOLN
2.0000 g | Freq: Once | INTRAVENOUS | Status: AC
  Administered 2020-06-17: 2 g via INTRAVENOUS
  Filled 2020-06-17: qty 50

## 2020-06-17 MED ORDER — ONDANSETRON HCL 4 MG PO TABS
4.0000 mg | ORAL_TABLET | Freq: Four times a day (QID) | ORAL | Status: DC | PRN
  Administered 2020-06-20: 4 mg via ORAL
  Filled 2020-06-17: qty 1

## 2020-06-17 MED ORDER — SERTRALINE HCL 50 MG PO TABS
50.0000 mg | ORAL_TABLET | Freq: Every day | ORAL | Status: DC
  Administered 2020-06-18 – 2020-06-22 (×5): 50 mg via ORAL
  Filled 2020-06-17 (×5): qty 1

## 2020-06-17 MED ORDER — ASPIRIN 81 MG PO CHEW
CHEWABLE_TABLET | ORAL | Status: AC
  Filled 2020-06-17: qty 1

## 2020-06-17 MED ORDER — ACETAMINOPHEN 650 MG RE SUPP: 650.0000 mg | Freq: Four times a day (QID) | RECTAL | Status: DC | PRN

## 2020-06-17 MED ORDER — SODIUM CHLORIDE 0.9 % IV SOLN
500.0000 mg | Freq: Once | INTRAVENOUS | Status: AC
  Administered 2020-06-17: 500 mg via INTRAVENOUS
  Filled 2020-06-17: qty 500

## 2020-06-17 MED ORDER — POTASSIUM CHLORIDE 2 MEQ/ML IV SOLN
INTRAVENOUS | Status: AC
  Filled 2020-06-17 (×2): qty 1000

## 2020-06-17 MED ORDER — ENOXAPARIN SODIUM 40 MG/0.4ML ~~LOC~~ SOLN
40.0000 mg | Freq: Two times a day (BID) | SUBCUTANEOUS | Status: DC
  Administered 2020-06-17 – 2020-06-22 (×10): 40 mg via SUBCUTANEOUS
  Filled 2020-06-17 (×10): qty 0.4

## 2020-06-17 MED ORDER — LORAZEPAM 0.5 MG PO TABS
0.5000 mg | ORAL_TABLET | Freq: Every day | ORAL | Status: DC | PRN
  Administered 2020-06-20: 0.5 mg via ORAL
  Filled 2020-06-17: qty 1

## 2020-06-17 MED ORDER — SODIUM CHLORIDE 0.9 % IV BOLUS
1000.0000 mL | Freq: Once | INTRAVENOUS | Status: AC
  Administered 2020-06-17: 1000 mL via INTRAVENOUS

## 2020-06-17 MED ORDER — TRAMADOL HCL 50 MG PO TABS: 50.0000 mg | ORAL_TABLET | Freq: Two times a day (BID) | ORAL | Status: DC | PRN

## 2020-06-17 MED ORDER — ACETAMINOPHEN 325 MG PO TABS
650.0000 mg | ORAL_TABLET | Freq: Four times a day (QID) | ORAL | Status: DC | PRN
  Administered 2020-06-18 – 2020-06-22 (×4): 650 mg via ORAL
  Filled 2020-06-17 (×4): qty 2

## 2020-06-17 MED ORDER — ENOXAPARIN SODIUM 40 MG/0.4ML ~~LOC~~ SOLN: 40.0000 mg | SUBCUTANEOUS | Status: DC

## 2020-06-17 MED ORDER — GABAPENTIN 400 MG PO CAPS
1600.0000 mg | ORAL_CAPSULE | Freq: Every day | ORAL | Status: DC | PRN
Start: 2020-06-17 — End: 2020-06-19

## 2020-06-17 MED ORDER — POTASSIUM CHLORIDE 20 MEQ/15ML (10%) PO SOLN
60.0000 meq | Freq: Once | ORAL | Status: AC
  Administered 2020-06-17: 60 meq via ORAL
  Filled 2020-06-17: qty 45

## 2020-06-17 MED ORDER — METOPROLOL SUCCINATE ER 50 MG PO TB24
50.0000 mg | ORAL_TABLET | Freq: Every day | ORAL | Status: DC
  Administered 2020-06-18 – 2020-06-20 (×3): 50 mg via ORAL
  Filled 2020-06-17 (×3): qty 1

## 2020-06-17 NOTE — H&P (Signed)
History and Physical    Tracy Espinoza:[id number] DOB: 06/21/1946 DOA: 06/17/2020  PCP: Pcp, No  Patient coming from: MD office via EMS  I have personally briefly reviewed patient's old medical records in [place]  Chief Complaint: Rigors, right flank pain  HPI: Tracy Espinoza is a 74 y.o. female with medical history significant for hypertension, GERD, depression/anxiety, and chronic lymphedema of both lower extremities who presents to the ED for evaluation of rigors and right flank pain.  Patient states she was in her usual state of health until 3 AM this morning when she woke up with significant shakes/rigors.  She developed right flank pain.  She took it at home UTI test which she says was positive.  She called her PCP for further evaluation.  When she was at the medical office she was walking when her leg gave out and she fell to the ground.  She had an abrasion to her left elbow but denied any other injury or hitting her head.  She did not lose consciousness.  She has had some associated nausea with vomiting.  She denies any subjective fevers, chills, diaphoresis, dyspnea, cough, dysuria.  She reports chronic diarrhea which is unchanged.  She reports chronic back pain.  She has chronic lymphedema in both of her lower extremities which is unchanged.  ED Course:  Initial vitals showed BP 126/104, pulse 55, RR 20, temp 98.6 Fahrenheit, SPO2 88% on room air.  While in the ED T-max was 100.5 Fahrenheit and blood pressure transiently dropped to 94/51 with map 64.  Patient was placed on 2 L supplemental O2 with improvement in O2 saturation to 98%.  Labs notable for WBC 18.6, hemoglobin 13.8, platelets 152,000, sodium 139, potassium 3.2, bicarb 32, BUN 41, creatinine 1.29, AST 25, ALT 13, alk phos 34, total bilirubin 1.8.  Urinalysis showed positive nitrites, moderate leukocytes, >50 RBCs and WBCs/hpf, many bacteria microscopy.  Urine culture was obtained and pending.  SARS-CoV-2 PCR  was ordered and pending.  CT abdomen/pelvis without contrast showed perinephric fat stranding about the right kidney with multiple right-sided renal calculi and small calculus in the urinary bladder.  Per radiology read, findings suggesting recently passed calculus with possibly superimposed pyelonephritis.  Chronic mild left hydronephrosis and hydroureter without obstruction noted again.  Atelectasis versus consolidation of the right lung base also noted.  Patient was given 1 L normal saline, IV ceftriaxone, IV Zofran, and IV azithromycin.  The hospitalist service was consulted to admit for further evaluation and management.  Review of Systems: All systems reviewed and are negative except as documented in history of present illness above.   Past Medical History:  Diagnosis Date  . Anxiety   . Arthritis   . Basal cell carcinoma 2019   L lateral sup clavicle   . Cancer ([place])    BASAL CELL SKIN CANCERS REMOVED  . Depression   . Edema    FEET/LEGS  . GERD (gastroesophageal reflux disease)   . Heart murmur   . History of hiatal hernia   . Hypertension   . Lymphedema   . MRSA (methicillin resistant staph aureus) culture positive    H/O    Past Surgical History:  Procedure Laterality Date  . ABDOMINAL HYSTERECTOMY    . APPENDECTOMY    . BACK SURGERY    . CATARACT EXTRACTION W/PHACO Left 03/28/2018   Procedure: CATARACT EXTRACTION PHACO AND INTRAOCULAR LENS PLACEMENT (IOC);  Surgeon: Eulogio Bear, MD;  Location: [facility] ORS;  Service:  Ophthalmology;  Laterality: Left;  [place] 00:28.2 AP% 7.9 CDE 2.21 Fluid pack lot # [id number]  . CATARACT EXTRACTION W/PHACO Right 05/16/2018   Procedure: CATARACT EXTRACTION PHACO AND INTRAOCULAR LENS PLACEMENT (IOC);  Surgeon: Eulogio Bear, MD;  Location: [facility] ORS;  Service: Ophthalmology;  Laterality: Right;  [place] 00:26.2 AP% 7.6 CDE 2.01 Fluid pack lot # [id number]  . CHOLECYSTECTOMY    . COLONOSCOPY    . FRACTURE SURGERY     BONE ON SIDE OF  RIGHT FOOT  . HERNIA REPAIR     UMBILICAL X 2  . JOINT REPLACEMENT Bilateral    knees  . KIDNEY SURGERY    . LUMBAR WOUND DEBRIDEMENT N/A 08/03/2016   Procedure: IRRIGATION AND DEBRIDEMENT LUMBAR WOUND;  Surgeon: Eustace Moore, MD;  Location: [facility];  Service: Neurosurgery;  Laterality: N/A;  . OVARIAN CYST SURGERY    . REPAIR OF LEFT URETER Left 40 YRS AGO  . ROTATOR CUFF REPAIR  2014  . TONSILLECTOMY    . TUBAL LIGATION      Social History:  reports that she has never smoked. She has never used smokeless tobacco. She reports current alcohol use of about 5.0 standard drinks of alcohol per week. She reports that she does not use drugs.  Allergies  Allergen Reactions  . Tape Itching, Dermatitis and Rash  . 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 Band-Aid Plus Antibiotic [Bacitracin-Polymyxin B] Other (See Comments)    Unknown  . Iodinated Diagnostic Agents Itching  . Morphine Sulfate Itching  . Vancomycin Rash    Family History  Problem Relation Age of Onset  . Hypertension Mother   . Stroke Mother   . Alzheimer's disease Mother   . Cancer Father        brain  . Hypertension Father   . Stroke Father      Prior to Admission medications   Medication Sig Start Date End Date Taking? Authorizing Provider  acetaminophen (ACETAMINOPHEN 8 HOUR) 650 MG CR tablet Take 650 mg by mouth every 8 (eight) hours as needed for pain.     [provider]  Ascorbic Acid (VITAMIN C) 1000 MG tablet Take 1,000 mg by mouth daily.    [provider]  aspirin EC 81 MG tablet Take 81 mg by mouth daily.    [provider]  Calcium Carb-Cholecalciferol (CALCIUM 600+D) 600-800 MG-UNIT TABS Take 1 tablet by mouth daily.     [provider]  Cranberry 425 MG CAPS Take by mouth.    [provider]  doxycycline (VIBRAMYCIN) 100 MG capsule Take 1 capsule (100 mg total) by mouth at bedtime. 05/06/20   Volney American, PA-C  gabapentin (NEURONTIN) 400 MG capsule TAKE UP TO 4 CAPSULES BY  MOUTH DAILY AS NEEDED 05/16/20   Volney American, PA-C  hydrochlorothiazide (HYDRODIURIL) 25 MG tablet Take 1 tablet (25 mg total) by mouth daily. 11/05/19   Volney American, PA-C  LORazepam (ATIVAN) 0.5 MG tablet TAKE 1 TABLET BY MOUTH  DAILY . TAKE ONLY FOR BAD  DAYS. 03/17/20   Volney American, PA-C  losartan (COZAAR) 100 MG tablet TAKE 1 TABLET BY MOUTH  DAILY 04/23/20   Volney American, PA-C  metoprolol succinate (TOPROL-XL) 50 MG 24 hr tablet Take 1 tablet (50 mg total) by mouth daily. 11/05/19   Volney American, PA-C  Multiple Vitamin (MULTIVITAMIN) tablet Take 1 tablet by mouth daily.  [provider]  nystatin (MYCOSTATIN/NYSTOP) powder Apply 1 application topically 2 (two) times daily. 11/05/19   Volney American, PA-C  nystatin cream (MYCOSTATIN) Apply 1 application topically 2 (two) times daily. 11/05/19   Volney American, PA-C  Omega-3 Fatty Acids (OMEGA-3 FISH OIL PO) Take 2 capsules by mouth daily.     [provider]  phenazopyridine (PYRIDIUM) 97 MG tablet Take 1 tablet (97 mg total) by mouth 3 (three) times daily as needed for pain (urinary pain). 05/11/20   Eulogio Bear, NP  Probiotic Product ([employer] DAILY PROBIOTIC PO) Take 1 capsule by mouth daily.     [provider]  psyllium (METAMUCIL) 28 % packet Take 1 packet by mouth daily. 01/14/20   Virgel Manifold, MD  sertraline (ZOLOFT) 50 MG tablet TAKE 1 TABLET BY MOUTH EVERY DAY 06/16/20   Cannady, Henrine Screws T, NP  traMADol (ULTRAM) 50 MG tablet Take 1 tablet (50 mg total) by mouth 2 (two) times daily as needed. 10/31/19   Volney American, PA-C  traZODone (DESYREL) 100 MG tablet TAKE 1 TABLET BY MOUTH AT  BEDTIME 05/21/20   Marnee Guarneri T, NP    Physical Exam: Vitals:   06/17/20 1323 06/17/20 1327 06/17/20 1518  BP: (!) 126/104  (!) 159/141  Pulse: (!) 55  73  Resp: 20   18  Temp: 98.6 F (37 C)  (!) 100.5 F (38.1 C)  TempSrc: Oral  Oral  SpO2:  (!) 88% 99%  Weight: 122 kg    Height: _0  (1.575 m)     Constitutional: Obese woman resting supine in bed, NAD, calm, appears tired comfortable Eyes: PERRL, lids and conjunctivae normal ENMT: Mucous membranes are moist. Posterior pharynx clear of any exudate or lesions.Normal dentition.  Neck: normal, supple, no masses. Respiratory: clear to auscultation bilaterally, no wheezing, no crackles. Normal respiratory effort. No accessory muscle use.  Cardiovascular: Regular rate and rhythm, no murmurs / rubs / gallops.  Chronic nonpitting bilateral lower extremity edema. 2+ pedal pulses. Abdomen: Mild RLQ tenderness, no masses palpated. No hepatosplenomegaly. Bowel sounds positive.  Musculoskeletal: no clubbing / cyanosis. No joint deformity upper and lower extremities. Good ROM. Normal muscle tone.  Skin: Diaphoretic.  Abrasion to left elbow.  No rashes, lesions, ulcers. No induration Neurologic: CN 2-12 grossly intact. Sensation intact, Strength 5/5 in all 4.  Psychiatric: Normal judgment and insight. Alert and oriented x 3. Normal mood.     Labs on Admission: I have personally reviewed following labs and imaging studies  CBC: Recent Labs  Lab 06/17/20 1326  WBC 18.6*  HGB 13.8  HCT 42.3  MCV 97.2  PLT 761   Basic Metabolic Panel: Recent Labs  Lab 06/17/20 1326 06/17/20 1820  NA 139  --   K 3.2*  --   CL 96*  --   CO2 32  --   GLUCOSE 139*  --   BUN 41*  --   CREATININE 1.29*  --   CALCIUM 9.3  --   MG  --  1.4*   GFR: Estimated Creatinine Clearance: 46.9 mL/min (A) (by C-G formula based on SCr of 1.29 mg/dL (H)). Liver Function Tests: Recent Labs  Lab 06/17/20 1326  AST 25  ALT 13  ALKPHOS 34*  BILITOT 1.8*  PROT 7.1  ALBUMIN 3.8   No results for input(s): LIPASE, AMYLASE in the last 168 hours. No results for input(s): AMMONIA in the last 168 hours. Coagulation Profile: No  results for input(s):  INR, PROTIME in the last 168 hours. Cardiac Enzymes: No results for input(s): CKTOTAL, CKMB, CKMBINDEX, TROPONINI in the last 168 hours. BNP (last 3 results) No results for input(s): PROBNP in the last 8760 hours. HbA1C: No results for input(s): HGBA1C in the last 72 hours. CBG: No results for input(s): GLUCAP in the last 168 hours. Lipid Profile: No results for input(s): CHOL, HDL, LDLCALC, TRIG, CHOLHDL, LDLDIRECT in the last 72 hours. Thyroid Function Tests: No results for input(s): TSH, T4TOTAL, FREET4, T3FREE, THYROIDAB in the last 72 hours. Anemia Panel: No results for input(s): VITAMINB12, FOLATE, FERRITIN, TIBC, IRON, RETICCTPCT in the last 72 hours. Urine analysis:    Component Value Date/Time   COLORURINE AMBER (A) 06/17/2020 1651   APPEARANCEUR CLOUDY (A) 06/17/2020 1651   APPEARANCEUR Cloudy (A) 05/11/2020 1425   LABSPEC 1.014 06/17/2020 1651   PHURINE 5.0 06/17/2020 1651   GLUCOSEU NEGATIVE 06/17/2020 1651   HGBUR MODERATE (A) 06/17/2020 1651   BILIRUBINUR NEGATIVE 06/17/2020 1651   BILIRUBINUR Negative 05/11/2020 1425   KETONESUR NEGATIVE 06/17/2020 1651   PROTEINUR >=300 (A) 06/17/2020 1651   NITRITE POSITIVE (A) 06/17/2020 1651   LEUKOCYTESUR MODERATE (A) 06/17/2020 1651    Radiological Exams on Admission: CT ABDOMEN PELVIS WO CONTRAST  Result Date: 06/17/2020 CLINICAL DATA:  UTI EXAM: CT ABDOMEN AND PELVIS WITHOUT CONTRAST TECHNIQUE: Multidetector CT imaging of the abdomen and pelvis was performed following the standard protocol without IV contrast. COMPARISON:  11/17/2015 FINDINGS: Lower chest: Atelectasis or consolidation of the right lung base. Underlying bandlike scarring. Hepatobiliary: No focal liver abnormality is seen. Status post cholecystectomy. No biliary dilatation. Pancreas: Unremarkable. No pancreatic ductal dilatation or surrounding inflammatory changes. Spleen: Normal in size. Unchanged low-attenuation cysts or hemangiomata of  the spleen. Adrenals/Urinary Tract: Adrenal glands are unremarkable. Perinephric fat stranding about the right kidney. Redemonstrated asymmetric atrophy of the left kidney. There are numerous bilateral renal lesions, the majority of which are fluid attenuation simple cysts, others incompletely characterized by noncontrast CT although generally not changed compared to prior examination dated 11/17/2015. Multiple right sided renal calculi. No right-sided hydronephrosis. Redemonstrated, mild left hydronephrosis and hydroureter without obstructing etiology to the left ureterovesicular junction. There is a tiny calculus within the urinary bladder lumen measuring no larger than 2 mm (series 2, image 76). Stomach/Bowel: Stomach is within normal limits. Appendix appears normal. No evidence of bowel wall thickening, distention, or inflammatory changes. Pancolonic diverticulosis. Vascular/Lymphatic: Aortic atherosclerosis. No enlarged abdominal or pelvic lymph nodes. Reproductive: Status post hysterectomy. Other: No abdominal wall hernia or abnormality. No abdominopelvic ascites. Musculoskeletal: No acute or significant osseous findings. IMPRESSION: 1. There is perinephric fat stranding about the right kidney. Multiple right-sided renal calculi with a small calculus in the urinary bladder, constellation of findings suggesting recently passed calculus, possibly with superimposed pyelonephritis. 2. Redemonstrated, chronic mild left hydronephrosis and hydroureter without obstructing etiology to the left ureterovesicular junction. Asymmetric atrophy of the left kidney. 3. Numerous bilateral renal lesions, generally fluid attenuation cysts, others incompletely characterized. 4. Atelectasis or consolidation of the right lung base, concerning for infection or aspiration. Electronically Signed   By: Eddie Candle M.D.   On: 06/17/2020 17:53    EKG: Not performed.  Assessment/Plan Principal Problem:   Pyelonephritis of right  kidney Active Problems:   Hypertension   Anxiety   AKI (acute kidney injury) ([facility])   Hypoxia   Lymphedema   Hypokalemia   Hypomagnesemia  Tracy Espinoza is a 74 y.o. female with medical history significant for hypertension,  GERD, depression/anxiety, and chronic lymphedema of both lower extremities who is admitted with right-sided pyelonephritis.  Right-sided pyelonephritis: Urinalysis suggestive of UTI and CT imaging suggestive of right-sided pyelonephritis with possible recently passed calculus.  Urine culture from 05/11/2020 grew E. coli which was resistant to ampicillin, tetracycline, and Bactrim but otherwise sensitive. -Continue IV ceftriaxone -Follow urine cultures, obtain blood cultures -Continue IV fluid hydration overnight  Hypoxia: Patient with intermittent hypoxia to 88% on room air while in the ED.  Improved with 2 L supplemental O2 via [place].  CT abdomen/pelvis showed atelectasis versus consolidation at the right lung base.  Patient was started on empiric azithromycin. -Obtain 2 view chest x-ray -Continue azithromycin for now and de-escalate as able -Start incentive spirometer -Continue supplemental oxygen and wean as able  AKI: In setting of right-sided pyelonephritis/UTI. -Continue IV fluid hydration as above -Hold home HCTZ and losartan  Hypokalemia/hypomagnesemia: Replete and recheck in a.m.  Hypertension: Continue home Toprol-XL.  Holding HCTZ/losartan with AKI.  Chronic lymphedema of bilateral lower extremities: Chronic appears stable.  Uses compression devices at home.  Anxiety: Continue home sertraline and as needed Ativan.  Chronic pain: Continue home tramadol as needed.  Generalized weakness: Had fall prior to ED arrival due to leg giving out.  Patient reports she has a walker at home.  Will request PT/OT eval.  DVT prophylaxis: Lovenox Code Status: Full code, confirmed with patient Family Communication: Discussed with patient's son at  bedside Disposition Plan: From home and likely discharge to home pending clinical improvement Consults called: None Admission status:  Status is: Inpatient  Remains inpatient appropriate because:IV treatments appropriate due to intensity of illness or inability to take PO and Inpatient level of care appropriate due to severity of illness   Dispo: The patient is from: Home              Anticipated d/c is to: Home pending PT/OT eval              Anticipated d/c date is: 2 days              Patient currently is not medically stable to d/c.  Zada Finders MD [facility]  If 7PM-7AM, please contact night-coverage [email or web address]  06/17/2020, 7:31 PM
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
## 2020-06-17 NOTE — ED Triage Notes (Signed)
Pt in via EMS from her MD office. Pt was walking in for a visit and her knees gave out.   116/56, 101.2 temp, FSBS 165, 80's HR

## 2020-06-17 NOTE — ED Triage Notes (Signed)
Pt states she has a UTI. Pt reports she knows because she took a test at home and she began to shake and her legs gave out.

## 2020-06-17 NOTE — Progress Notes (Signed)
Ht 5\' 1"  (1.549 m)   Wt 269 lb (122 kg)   BMI 50.83 kg/m    Subjective:    Patient ID: Tracy Espinoza, female    DOB: [birth year], 74 y.o.   MRN: [id number]  HPI: Tracy Espinoza is a 74 y.o. female  Chief Complaint  Patient presents with  . Urinary Tract Infection  . rigors   Was seen last month for a UTI. Started feeling bad again yesterday. The emergency alert went off and EMS was called but they were concerned that she was going to have to wait 12 hours in the ER, so they had her call here instead  URINARY SYMPTOMS Duration: 3AM, started with rigors Dysuria: pain Urinary frequency: no Urgency: no Small volume voids: no Symptom severity: no Urinary incontinence: no Foul odor: no Hematuria: no Abdominal pain: no Back pain: no Suprapubic pain/pressure: yes Flank pain: no Fever:  no Vomiting: yes Relief with cranberry juice: no Relief with pyridium: no Status: worse Previous urinary tract infection: yes Recurrent urinary tract infection: no Treatments attempted: pyridium, cranberry and increasing fluids   Relevant past medical, surgical, family and social history reviewed and updated as indicated. Interim medical history since our last visit reviewed. Allergies and medications reviewed and updated.  Review of Systems  Per HPI unless specifically indicated above     Objective:    Ht 5\' 1"  (1.549 m)   Wt 269 lb (122 kg)   BMI 50.83 kg/m   Wt Readings from Last 3 Encounters:  06/17/20 269 lb (122 kg)  05/11/20 269 lb 6.4 oz (122.2 kg)  04/23/20 (!) 274 lb (124.3 kg)    Physical Exam Vitals and nursing note reviewed.  Constitutional:      General: She is not in acute distress.    Appearance: Normal appearance. She is not ill-appearing, toxic-appearing or diaphoretic.  HENT:     Head: Normocephalic and atraumatic.     Right Ear: External ear normal.     Left Ear: External ear normal.     Nose: Nose normal.     Mouth/Throat:     Mouth: Mucous membranes  are moist.     Pharynx: Oropharynx is clear.  Eyes:     General: No scleral icterus.       Right eye: No discharge.        Left eye: No discharge.     Conjunctiva/sclera: Conjunctivae normal.     Pupils: Pupils are equal, round, and reactive to light.  Pulmonary:     Effort: Pulmonary effort is normal. No respiratory distress.     Comments: Speaking in full sentences Musculoskeletal:        General: Normal range of motion.     Cervical back: Normal range of motion.  Skin:    Coloration: Skin is not jaundiced or pale.     Findings: No bruising, erythema, lesion or rash.  Neurological:     Mental Status: She is alert and oriented to person, place, and time. Mental status is at baseline.  Psychiatric:        Mood and Affect: Mood normal.        Behavior: Behavior normal.        Thought Content: Thought content normal.        Judgment: Judgment normal.     Results for orders placed or performed in visit on 05/11/20  Microscopic Examination   Urine  Result Value Ref Range   WBC, UA >30 (A) 0 -  5 /hpf   RBC 11-30 (A) 0 - 2 /hpf   Epithelial Cells (non renal) 0-10 0 - 10 /hpf   Mucus, UA Present Not Estab.   Bacteria, UA Moderate (A) None seen/Few  Urine Culture, Reflex   Urine  Result Value Ref Range   Urine Culture, Routine Final report (A)    Organism ID, Bacteria Escherichia coli (A)    ORGANISM ID, BACTERIA Comment    Antimicrobial Susceptibility Comment   UA/M w/rflx Culture, Routine   Specimen: Urine   Urine  Result Value Ref Range   Specific Gravity, UA 1.020 1.005 - 1.030   pH, UA 5.0 5.0 - 7.5   Color, UA Yellow Yellow   Appearance Ur Cloudy (A) Clear   Leukocytes,UA 2+ (A) Negative   Protein,UA 1+ (A) Negative/Trace   Glucose, UA Negative Negative   Ketones, UA Trace (A) Negative   RBC, UA 3+ (A) Negative   Bilirubin, UA Negative Negative   Urobilinogen, Ur 0.2 0.2 - 1.0 mg/dL   Nitrite, UA Negative Negative   Microscopic Examination See below:     Urinalysis Reflex Comment       Assessment & Plan:   Problem List Items Addressed This Visit    None    Visit Diagnoses    Dysuria    -  Primary   Significant concern for pyelonephritis or sepsis due to rigors- will get her in for vitals and labs ASAP, if not better by this evening, will need to go ER.    Relevant Orders   UA/M w/rflx Culture, Routine   Rigors       Significant concern for pyelonephritis or sepsis due to rigors- will get her in for vitals and labs ASAP, if not better by this evening, will need to go ER.    Relevant Orders   Comprehensive metabolic panel   Fatigue, unspecified type       Will check CBC.    Relevant Orders   CBC with Differential/Platelet       Follow up plan: No follow-ups on file.    . This visit was completed via MyChart due to the restrictions of the COVID-19 pandemic. All issues as above were discussed and addressed. Physical exam was done as above through visual confirmation on MyChart. If it was felt that the patient should be evaluated in the office, they were directed there. The patient verbally consented to this visit. . Location of the patient: home . Location of the provider: work . Those involved with this call:  . Provider: Park Liter, DO . CMA: Lesle Chris, [place] . Front Desk/Registration: Don Perking  . Time spent on call: 15 minutes with patient face to face via video conference. More than 50% of this time was spent in counseling and coordination of care. 23 minutes total spent in review of patient's record and preparation of their chart.

## 2020-06-17 NOTE — ED Notes (Signed)
Pt given meal tray and drink.

## 2020-06-17 NOTE — Progress Notes (Signed)
PHARMACIST - PHYSICIAN COMMUNICATION  CONCERNING:  Enoxaparin (Lovenox) for DVT Prophylaxis    RECOMMENDATION: Patient was prescribed enoxaprin 40mg  q24 hours for VTE prophylaxis.   Filed Weights   06/17/20 1323  Weight: 122 kg (269 lb)    Body mass index is 49.2 kg/m.  Estimated Creatinine Clearance: 46.9 mL/min (A) (by C-G formula based on SCr of 1.29 mg/dL (H)).   Based on [facility] patient is candidate for enoxaparin 40mg  every 12 hours due to BMI being >40.  DESCRIPTION: Pharmacy has adjusted enoxaparin dose per [facility] policy.  Patient is now receiving enoxaparin 40 mg every 12 hours    Berta Minor, PharmD Clinical Pharmacist  06/17/2020 7:28 PM

## 2020-06-18 ENCOUNTER — Encounter: Payer: Self-pay | Admitting: Internal Medicine

## 2020-06-18 LAB — CBC
HCT: 38.1 % (ref 36.0–46.0)
Hemoglobin: 12.1 g/dL (ref 12.0–15.0)
MCH: 31.8 pg (ref 26.0–34.0)
MCHC: 31.8 g/dL (ref 30.0–36.0)
MCV: 100 fL (ref 80.0–100.0)
Platelets: 127 10*3/uL — ABNORMAL LOW (ref 150–400)
RBC: 3.81 MIL/uL — ABNORMAL LOW (ref 3.87–5.11)
RDW: 14 % (ref 11.5–15.5)
WBC: 13 10*3/uL — ABNORMAL HIGH (ref 4.0–10.5)
nRBC: 0 % (ref 0.0–0.2)

## 2020-06-18 LAB — MAGNESIUM: Magnesium: 2 mg/dL (ref 1.7–2.4)

## 2020-06-18 LAB — BASIC METABOLIC PANEL
Anion gap: 7 (ref 5–15)
BUN: 40 mg/dL — ABNORMAL HIGH (ref 8–23)
CO2: 32 mmol/L (ref 22–32)
Calcium: 8.3 mg/dL — ABNORMAL LOW (ref 8.9–10.3)
Chloride: 102 mmol/L (ref 98–111)
Creatinine, Ser: 1.33 mg/dL — ABNORMAL HIGH (ref 0.44–1.00)
GFR calc Af Amer: 45 mL/min — ABNORMAL LOW (ref >60)
GFR calc non Af Amer: 39 mL/min — ABNORMAL LOW (ref >60)
Glucose, Bld: 121 mg/dL — ABNORMAL HIGH (ref 70–99)
Potassium: 4 mmol/L (ref 3.5–5.1)
Sodium: 141 mmol/L (ref 135–145)

## 2020-06-18 MED ORDER — SODIUM CHLORIDE 0.9 % IV SOLN: INTRAVENOUS | Status: DC

## 2020-06-18 MED ORDER — PSYLLIUM 95 % PO PACK
1.0000 | PACK | Freq: Every day | ORAL | Status: DC
  Administered 2020-06-18 – 2020-06-22 (×5): 1 via ORAL
  Filled 2020-06-18 (×6): qty 1

## 2020-06-18 NOTE — Evaluation (Signed)
Physical Therapy Evaluation Patient Details Name: Tracy Espinoza MRN: [id number] DOB: [birth year] Today's Date: 06/18/2020   History of Present Illness  Pt is a 74 y.o. female with medical history significant for hypertension, GERD, depression/anxiety, and chronic lymphedema of both lower extremities who presents to the ED for evaluation of rigors and right flank pain. Also had a fall at home while walking her dog. Workup suggestive of UTI.    Clinical Impression  Pt alert, able to speak 2-3 words at time, limited by SOB and pt with some difficulty word finding as well. Oriented x4. Reported 2 falls yesterday, no other falls in the last 6 months, lives alone modI/I for ADLs/IADLs, has a daughter that assists as able.  The patient demonstrated bed mobility with minAx2, needed significant assistance to move LEs. Able to sit EOB with fair balance but very reliant on UE support. Sit <> stand with modAx2 from significantly elevated surface, and performed stand pivot with modAx2. Pt utilized bed rails/2 person handheld assist to complete. Pt repositioned in bed, reported fatigue.  Overall the patient demonstrated deficits (see "PT Problem List") that impede the patient's functional abilities, safety, and mobility and would benefit from skilled PT intervention. Recommendation is SNF due to acute decline in functional status and decreased caregiver support at home.     Follow Up Recommendations SNF    Equipment Recommendations  Other (comment) (TBD at next venue of care)    Recommendations for Other Services       Precautions / Restrictions Precautions Precautions: Fall Restrictions Weight Bearing Restrictions: No      Mobility  Bed Mobility Overal bed mobility: Needs Assistance Bed Mobility: Supine to Sit;Sit to Supine     Supine to sit: Mod assist;+2 for physical assistance;HOB elevated Sit to supine: +2 for physical assistance;Min assist;HOB elevated   General bed mobility comments:  heavy use of bed rails  Transfers Overall transfer level: Needs assistance Equipment used: 2 person hand held assist Transfers: Sit to/from [facility] Sit to Stand: Min assist;+2 physical assistance Stand pivot transfers: Mod assist;+2 physical assistance       General transfer comment: pt heavily reliant on UE support to complete transfer  Ambulation/Gait             General Gait Details: deferred due to safety  Stairs            Wheelchair Mobility    Modified Rankin (Stroke Patients Only)       Balance Overall balance assessment: Needs assistance Sitting-balance support: Feet supported Sitting balance-Leahy Scale: Fair       Standing balance-Leahy Scale: Poor Standing balance comment: reliant on UE support                             Pertinent Vitals/Pain Pain Assessment: 0-10 Pain Score: 4  Pain Location: low back pain Pain Descriptors / Indicators: Aching Pain Intervention(s): Limited activity within patient's tolerance;Monitored during session;Repositioned    Home Living Family/patient expects to be discharged to:: Private residence Living Arrangements: Alone Available Help at Discharge: Family Type of Home: House Home Access: Ramped entrance     Home Layout: One level Home Equipment: Environmental consultant - 2 wheels;Walker - 4 wheels;Cane - single point;Wheelchair - manual;Grab bars - toilet;Walker - standard      Prior Function           Comments: Pt ambulates with standard walker most of the time. trying to wean off  of walker use with pelvic health PT.     Hand Dominance        Extremity/Trunk Assessment   Upper Extremity Assessment Upper Extremity Assessment: Generalized weakness    Lower Extremity Assessment Lower Extremity Assessment: Generalized weakness (unable to lift LE off of bed due to weakness without assistance)    Cervical / Trunk Assessment Cervical / Trunk Assessment: Normal  Communication    Communication: No difficulties  Cognition Arousal/Alertness: Awake/alert Behavior During Therapy: WFL for tasks assessed/performed Overall Cognitive Status: Within Functional Limits for tasks assessed                                 General Comments: some difficulty with word finding noted. SOB with conversation      General Comments      Exercises     Assessment/Plan    PT Assessment Patient needs continued PT services  PT Problem List Decreased strength;Decreased activity tolerance;Decreased balance;Decreased mobility;Cardiopulmonary status limiting activity       PT Treatment Interventions DME instruction;Balance training;Gait training;Neuromuscular re-education;Stair training;Functional mobility training;Patient/family education;Therapeutic activities;Therapeutic exercise    PT Goals (Current goals can be found in the Care Plan section)  Acute Rehab PT Goals Patient Stated Goal: to go home PT Goal Formulation: With patient Time For Goal Achievement: 07/02/20 Potential to Achieve Goals: Good    Frequency Min 2X/week   Barriers to discharge Decreased caregiver support;Inaccessible home environment      Co-evaluation               AM-PAC PT "6 Clicks" Mobility  Outcome Measure Help needed turning from your back to your side while in a flat bed without using bedrails?: A Lot Help needed moving from lying on your back to sitting on the side of a flat bed without using bedrails?: A Lot Help needed moving to and from a bed to a chair (including a wheelchair)?: A Lot Help needed standing up from a chair using your arms (e.g., wheelchair or bedside chair)?: A Lot Help needed to walk in hospital room?: A Lot Help needed climbing 3-5 steps with a railing? : Total 6 Click Score: 11    End of Session Equipment Utilized During Treatment: Gait belt Activity Tolerance: Patient limited by fatigue Patient left: in bed;with call bell/phone within reach;with  bed alarm set Nurse Communication: Mobility status PT Visit Diagnosis: Other abnormalities of gait and mobility (R26.89);Difficulty in walking, not elsewhere classified (R26.2);Muscle weakness (generalized) (M62.81)    Time: 4132-4401 PT Time Calculation (min) (ACUTE ONLY): 40 min   Charges:   PT Evaluation $PT Eval Low Complexity: 1 Low PT Treatments $Therapeutic Activity: 23-37 mins        Lieutenant Diego PT, DPT 9:50 AM,06/18/20

## 2020-06-18 NOTE — Progress Notes (Signed)
PROGRESS NOTE    Tracy Espinoza  IRC:[id number] DOB: Feb 14, 1946 DOA: 06/17/2020 PCP: Pcp, No    Brief Narrative:  Tracy Espinoza is a 74 y.o. female with medical history significant for hypertension, GERD, depression/anxiety, and chronic lymphedema of both lower extremities who presents to the ED for evaluation of rigors and right flank pain.  Patient states she was in her usual state of health until 3 AM this morning when she woke up with significant shakes/rigors.  She developed right flank pain.  She took it at home UTI test which she says was positive.  She called her PCP for further evaluation.  When she was at the medical office she was walking when her leg gave out and she fell to the ground.  She had an abrasion to her left elbow but denied any other injury or hitting her head.  She did not lose consciousness.  She has had some associated nausea with vomiting.  She denies any subjective fevers, chills, diaphoresis, dyspnea, cough, dysuria.  She reports chronic diarrhea which is unchanged.  She reports chronic back pain.  She has chronic lymphedema in both of her lower extremities which is unchanged.    Consultants:   none  Procedures:  CT 9/16 1. There is perinephric fat stranding about the right kidney. Multiple right-sided renal calculi with a small calculus in the urinary bladder, constellation of findings suggesting recently passed calculus, possibly with superimposed pyelonephritis. 2. Redemonstrated, chronic mild left hydronephrosis and hydroureter without obstructing etiology to the left ureterovesicular junction. Asymmetric atrophy of the left kidney. 3. Numerous bilateral renal lesions, generally fluid attenuation cysts, others incompletely characterized. 4. Atelectasis or consolidation of the right lung base, concerning for infection or aspiration.   Antimicrobials:   Ceftriaxone and azithromycin    Subjective: Pt still with mild flank rt pain. No other  issues. Feels weak.  Objective: Vitals:   06/18/20 0523 06/18/20 0730 06/18/20 0800 06/18/20 0830  BP: (!) 128/57 90/66 (!) 101/49 98/76  Pulse: 85 86 80 90  Resp: 18     Temp:      TempSrc:      SpO2: 94% 91% 92% 93%  Weight:      Height:        Intake/Output Summary (Last 24 hours) at 06/18/2020 1540 Last data filed at 06/18/2020 0242 Gross per 24 hour  Intake 47.38 ml  Output --  Net 47.38 ml   Filed Weights   06/17/20 1323  Weight: 122 kg    Examination:  General exam: Appears calm and comfortable  Respiratory system: Clear to auscultation. Respiratory effort normal. Cardiovascular system: S1 & S2 heard, RRR. No JVD, murmurs, rubs, gallops or clicks.  Gastrointestinal system: Abdomen is nondistended, soft and nontender. Normal bowel sounds heard. Central nervous system: Alert and oriented. No focal neurological deficits. Extremities: No edema Skin: Warm dry Psychiatry: Judgement and insight appear normal. Mood & affect appropriate.     Data Reviewed: I have personally reviewed following labs and imaging studies  CBC: Recent Labs  Lab 06/17/20 1326 06/18/20 0453  WBC 18.6* 13.0*  HGB 13.8 12.1  HCT 42.3 38.1  MCV 97.2 100.0  PLT 152 510*   Basic Metabolic Panel: Recent Labs  Lab 06/17/20 1326 06/17/20 1820 06/18/20 0453  NA 139  --  141  K 3.2*  --  4.0  CL 96*  --  102  CO2 32  --  32  GLUCOSE 139*  --  121*  BUN 41*  --  40*  CREATININE 1.29*  --  1.33*  CALCIUM 9.3  --  8.3*  MG  --  1.4* 2.0   GFR: Estimated Creatinine Clearance: 45.5 mL/min (A) (by C-G formula based on SCr of 1.33 mg/dL (H)). Liver Function Tests: Recent Labs  Lab 06/17/20 1326  AST 25  ALT 13  ALKPHOS 34*  BILITOT 1.8*  PROT 7.1  ALBUMIN 3.8   No results for input(s): LIPASE, AMYLASE in the last 168 hours. No results for input(s): AMMONIA in the last 168 hours. Coagulation Profile: No results for input(s): INR, PROTIME in the last 168 hours. Cardiac  Enzymes: No results for input(s): CKTOTAL, CKMB, CKMBINDEX, TROPONINI in the last 168 hours. BNP (last 3 results) No results for input(s): PROBNP in the last 8760 hours. HbA1C: No results for input(s): HGBA1C in the last 72 hours. CBG: No results for input(s): GLUCAP in the last 168 hours. Lipid Profile: No results for input(s): CHOL, HDL, LDLCALC, TRIG, CHOLHDL, LDLDIRECT in the last 72 hours. Thyroid Function Tests: No results for input(s): TSH, T4TOTAL, FREET4, T3FREE, THYROIDAB in the last 72 hours. Anemia Panel: No results for input(s): VITAMINB12, FOLATE, FERRITIN, TIBC, IRON, RETICCTPCT in the last 72 hours. Sepsis Labs: No results for input(s): PROCALCITON, LATICACIDVEN in the last 168 hours.  Recent Results (from the past 240 hour(s))  Urine Culture     Status: Abnormal (Preliminary result)   Collection Time: 06/17/20  5:20 PM   Specimen: Urine, Random  Result Value Ref Range Status   Specimen Description   Final    URINE, RANDOM Performed at [facility], [address], [place], [place] [address]    Special Requests   Final    NONE Performed at [facility], [address] [place], [place] [address]    Culture (A)  Final    >=100,000 COLONIES/mL ESCHERICHIA COLI SUSCEPTIBILITIES TO FOLLOW Performed at [facility], [place] [address] [place], [place] [address]    Report Status PENDING  Incomplete  SARS Coronavirus 2 by RT PCR (hospital order, performed in [facility] hospital lab) Nasopharyngeal Nasopharyngeal Swab     Status: None   Collection Time: 06/17/20  8:04 PM   Specimen: Nasopharyngeal Swab  Result Value Ref Range Status   SARS Coronavirus 2 NEGATIVE NEGATIVE Final    Comment: (NOTE) SARS-CoV-2 target nucleic acids are NOT DETECTED.  The SARS-CoV-2 RNA is generally detectable in upper and lower respiratory specimens during the acute phase of infection. The lowest concentration of SARS-CoV-2 viral copies this assay can detect is  250 copies / mL. A negative result does not preclude SARS-CoV-2 infection and should not be used as the sole basis for treatment or other patient management decisions.  A negative result may occur with improper specimen collection / handling, submission of specimen other than nasopharyngeal swab, presence of viral mutation(s) within the areas targeted by this assay, and inadequate number of viral copies (<250 copies / mL). A negative result must be combined with clinical observations, patient history, and epidemiological information.  Fact Sheet for Patients:   StrictlyIdeas.no  Fact Sheet for Healthcare Providers: BankingDealers.co.za  This test is not yet approved or  cleared by the [place] FDA and has been authorized for detection and/or diagnosis of SARS-CoV-2 by FDA under an Emergency Use Authorization (EUA).  This EUA will remain in effect (meaning this test can be used) for the duration of the COVID-19 declaration under Section 564(b)(1) of the Act, 21 U.S.C. section 360bbb-3(b)(1), unless  the authorization is terminated or revoked sooner.  Performed at [facility], [address] [place], [place] [address]   CULTURE, BLOOD (ROUTINE X 2) w Reflex to ID Panel     Status: None (Preliminary result)   Collection Time: 06/17/20  8:04 PM   Specimen: BLOOD  Result Value Ref Range Status   Specimen Description BLOOD RIGHT [facility]  Final   Special Requests   Final    BOTTLES DRAWN AEROBIC AND ANAEROBIC Blood Culture adequate volume   Culture   Final    NO GROWTH < 12 HOURS Performed at [facility], [address], [place], [place] [address]    Report Status PENDING  Incomplete         Radiology Studies: CT ABDOMEN PELVIS WO CONTRAST  Result Date: 06/17/2020 CLINICAL DATA:  UTI EXAM: CT ABDOMEN AND PELVIS WITHOUT CONTRAST TECHNIQUE: Multidetector CT imaging of the abdomen and pelvis was performed  following the standard protocol without IV contrast. COMPARISON:  11/17/2015 FINDINGS: Lower chest: Atelectasis or consolidation of the right lung base. Underlying bandlike scarring. Hepatobiliary: No focal liver abnormality is seen. Status post cholecystectomy. No biliary dilatation. Pancreas: Unremarkable. No pancreatic ductal dilatation or surrounding inflammatory changes. Spleen: Normal in size. Unchanged low-attenuation cysts or hemangiomata of the spleen. Adrenals/Urinary Tract: Adrenal glands are unremarkable. Perinephric fat stranding about the right kidney. Redemonstrated asymmetric atrophy of the left kidney. There are numerous bilateral renal lesions, the majority of which are fluid attenuation simple cysts, others incompletely characterized by noncontrast CT although generally not changed compared to prior examination dated 11/17/2015. Multiple right sided renal calculi. No right-sided hydronephrosis. Redemonstrated, mild left hydronephrosis and hydroureter without obstructing etiology to the left ureterovesicular junction. There is a tiny calculus within the urinary bladder lumen measuring no larger than 2 mm (series 2, image 76). Stomach/Bowel: Stomach is within normal limits. Appendix appears normal. No evidence of bowel wall thickening, distention, or inflammatory changes. Pancolonic diverticulosis. Vascular/Lymphatic: Aortic atherosclerosis. No enlarged abdominal or pelvic lymph nodes. Reproductive: Status post hysterectomy. Other: No abdominal wall hernia or abnormality. No abdominopelvic ascites. Musculoskeletal: No acute or significant osseous findings. IMPRESSION: 1. There is perinephric fat stranding about the right kidney. Multiple right-sided renal calculi with a small calculus in the urinary bladder, constellation of findings suggesting recently passed calculus, possibly with superimposed pyelonephritis. 2. Redemonstrated, chronic mild left hydronephrosis and hydroureter without obstructing  etiology to the left ureterovesicular junction. Asymmetric atrophy of the left kidney. 3. Numerous bilateral renal lesions, generally fluid attenuation cysts, others incompletely characterized. 4. Atelectasis or consolidation of the right lung base, concerning for infection or aspiration. Electronically Signed   By: Eddie Candle M.D.   On: 06/17/2020 17:53   X-ray chest PA and lateral  Result Date: 06/17/2020 CLINICAL DATA:  Instability when walking, hypertension EXAM: CHEST - 2 VIEW COMPARISON:  08/21/2016, 12/02/2019 FINDINGS: Frontal and lateral views of the chest demonstrate an unremarkable cardiac silhouette. No airspace disease, effusion, or pneumothorax. Minimal scarring at the lung bases. No acute bony abnormalities. IMPRESSION: 1. No acute intrathoracic process. Electronically Signed   By: Randa Ngo M.D.   On: 06/17/2020 19:48        Scheduled Meds: . aspirin EC  81 mg Oral Daily  . enoxaparin (LOVENOX) injection  40 mg Subcutaneous Q12H  . metoprolol succinate  50 mg Oral Daily  . sertraline  50 mg Oral Daily   Continuous Infusions: . sodium chloride    . azithromycin (ZITHROMAX) 500 MG IVPB (Vial-Mate  Adaptor)    . cefTRIAXone (ROCEPHIN)  IV Stopped (06/18/20 1445)    Assessment & Plan:   Principal Problem:   Pyelonephritis of right kidney Active Problems:   Hypertension   Anxiety   AKI (acute kidney injury) ([place])   Hypoxia   Lymphedema   Hypokalemia   Hypomagnesemia  Tracy Espinoza is a 74 y.o. female with medical history significant for hypertension, GERD, depression/anxiety, and chronic lymphedema of both lower extremities who is admitted with right-sided pyelonephritis   Right-sided pyelonephritis: UA +, CT findings suggestive of recently passed calculus and right pyelo- Awaiting urine culture results pending  Follow-up blood cultures  Continue on IV ceftriaxone  We will start gentle hydration IV fluids    Hypoxia: Patient with intermittent hypoxia  to 88% on room air while in the ED.  Improved with 2 L supplemental O2 via [place].  CT abdomen/pelvis showed atelectasis versus consolidation at the right lung base.  We will start her on incentive spirometer She was started on empiric azithromycin along with ceftriaxone for ?pneumonia  vs. Aspiration v.s atelactasis cxr negative.    AKI: In setting of right-sided pyelonephritis/UTI. -Home HCTZ and losartan were held  Creatinine today is 1.33  We will start gentle hydration    Hypokalemia/hypomagnesemia: Repleted, now stable.  K is 4, magnesium 2  Hypertension: Stable, continue Toprol-XL.   Holding losartan and HCTZ for AKI    Chronic lymphedema of bilateral lower extremities: Chronic, appears stable  Uses compression device at home  Keep legs elevated  Anxiety: Continue sertraline and needed Ativan    Chronic pain: Continue home tramadol as needed.  Generalized weakness: Had fall prior to ED arrival due to leg giving out.  Patient reports she has a walker at home.   PT/OT saw her , recommend SNF    DVT prophylaxis: Lovenox Code Status: Full Family Communication: Updated daughter via phone  Status is: Inpatient  Remains inpatient appropriate because:IV treatments appropriate due to intensity of illness or inability to take PO   Dispo: The patient is from: Home              Anticipated d/c is to: SNF              Anticipated d/c date is: 2 days              Patient currently is not medically stable to d/c.will need SNF placement. On iv abx.            LOS: 1 day   Time spent: 35 minutes with more than 50% on [place], MD [facility] Pager [phone]  If 7PM-7AM, please contact night-coverage [email or web address] Password [email or web address] 06/18/2020, 3:40 PM

## 2020-06-18 NOTE — Evaluation (Signed)
Occupational Therapy Evaluation Patient Details Name: Tracy Espinoza MRN: [id number] DOB: May 31, 1946 Today's Date: 06/18/2020    History of Present Illness Pt is a 74 y.o. female with medical history significant for hypertension, GERD, depression/anxiety, and chronic lymphedema of both lower extremities who presents to the ED for evaluation of rigors and right flank pain. Also had a fall at home while walking her dog. Workup suggestive of UTI.   Clinical Impression   Tracy Espinoza was seen for OT evaluation this date. Prior to hospital admission, pt was MOD I for mobility using RW and ADLs - pt uses multiple pads 2/2 chronic diarrhea. Pt lives alone c DTR available PRN. Pt presents to acute OT demonstrating impaired ADL performance and functional mobility 2/2 functional strength/balance deficits, decreased activity tolerance, and decreased LB access.   Upon arrival, pt reported being soiled 2/2 chronic diarrhea. Pt currently required MAX A x2 toileting at bed level including perihygiene. MAX A doff B socks at bed level. MOD I self-feeding semi-reclined in bed. Pt would benefit from skilled OT to address noted impairments and functional limitations (see below for any additional details) in order to maximize safety and independence while minimizing falls risk and caregiver burden. Upon hospital discharge, recommend STR to maximize pt safety and return to PLOF.\    Follow Up Recommendations  SNF    Equipment Recommendations  Other (comment) (TBD)    Recommendations for Other Services       Precautions / Restrictions Precautions Precautions: Fall Restrictions Weight Bearing Restrictions: No      Mobility Bed Mobility Overal bed mobility: Needs Assistance Bed Mobility: Rolling Rolling: Mod assist;+2 for physical assistance     General bed mobility comments: heavy use of bed rails, difficulty c BLE mgmt          ADL either performed or assessed with clinical judgement   ADL Overall  ADL's : Needs assistance/impaired    General ADL Comments: MAX A x2 toileting at bed level including perihygiene. MAX A doff B socks at bed level. MOD I self-feeding semi-reclined in bed.                 Pertinent Vitals/Pain Pain Assessment: Pt denies pain      Hand Dominance Right   Extremity/Trunk Assessment Upper Extremity Assessment Upper Extremity Assessment: Generalized weakness   Lower Extremity Assessment Lower Extremity Assessment: Generalized weakness     Communication Communication Communication: No difficulties   Cognition Arousal/Alertness: Awake/alert Behavior During Therapy: WFL for tasks assessed/performed Overall Cognitive Status: Within Functional Limits for tasks assessed        General Comments  Intermittent desat to 85% on 3L [place] during mobility - pt noted to hold breath, resolved c VCs to 100%    Exercises Exercises: Other exercises Other Exercises Other Exercises: Pt educated re: OT role, DME recs, d/c recs, skin breakdown prevention, falls prevention, ECS, safe t/f technique Other Exercises: Toileting, L+R rolling, sidelying tolerance/balance, LBD   Shoulder Instructions      Home Living Family/patient expects to be discharged to:: Private residence Living Arrangements: Alone Available Help at Discharge: Family Type of Home: House Home Access: Ramped entrance     [place]: One level     Bathroom Shower/Tub: Hospital doctor Toilet: Handicapped height     Home Equipment: Environmental consultant - 2 wheels;Walker - 4 wheels;Cane - single point;Wheelchair - manual;Grab bars - toilet;Walker - standard          Prior Functioning/Environment Level of Independence:  Independent with assistive device(s)        Comments: Pt ambulates with standard walker most of the time. trying to wean off of walker use with pelvic health PT.        OT Problem List: Decreased strength;Decreased activity tolerance;Impaired balance (sitting and/or  standing);Increased edema      OT Treatment/Interventions: Self-care/ADL training;Therapeutic exercise;Energy conservation;DME and/or AE instruction;Therapeutic activities;Patient/family education;Balance training    OT Goals(Current goals can be found in the care plan section) Acute Rehab OT Goals Patient Stated Goal: to go home OT Goal Formulation: With patient Time For Goal Achievement: 07/02/20 Potential to Achieve Goals: Fair ADL Goals Pt Will Perform Grooming: sitting;with supervision Pt Will Transfer to Toilet: with mod assist (rolling bed level) Pt Will Perform Toileting - Clothing Manipulation and hygiene: with min assist;bed level Pt/caregiver will Perform Home Exercise Program: Increased strength;Both right and left upper extremity  OT Frequency: Min 1X/week   Barriers to D/C: Decreased caregiver support             AM-PAC OT "6 Clicks" Daily Activity     Outcome Measure Help from another person eating meals?: None Help from another person taking care of personal grooming?: A Little Help from another person toileting, which includes using toliet, bedpan, or urinal?: A Lot Help from another person bathing (including washing, rinsing, drying)?: A Lot Help from another person to put on and taking off regular upper body clothing?: A Little Help from another person to put on and taking off regular lower body clothing?: A Lot 6 Click Score: 16   End of Session Equipment Utilized During Treatment: Oxygen  Activity Tolerance: Patient tolerated treatment well Patient left: in bed;with call bell/phone within reach  OT Visit Diagnosis: Muscle weakness (generalized) (M62.81)                Time: 3810-1751 OT Time Calculation (min): 50 min Charges:  OT General Charges $OT Visit: 1 Visit OT Evaluation $OT Eval Moderate Complexity: 1 Mod OT Treatments $Self Care/Home Management : 23-37 mins $Therapeutic Activity: 8-22 mins  Dessie Coma, M.S. OTR/L  06/18/20, 1:28 PM   ascom [phone]

## 2020-06-18 NOTE — ED Notes (Addendum)
Last note of documentation was in error.

## 2020-06-18 NOTE — ED Notes (Signed)
Pt continues sleeping. NAD at this time.

## 2020-06-18 NOTE — ED Notes (Signed)
Patient pulled up in bed and set up with food tray

## 2020-06-19 ENCOUNTER — Inpatient Hospital Stay: Payer: Medicare Other

## 2020-06-19 LAB — CBC
HCT: 41.8 % (ref 36.0–46.0)
Hemoglobin: 12.5 g/dL (ref 12.0–15.0)
MCH: 31.3 pg (ref 26.0–34.0)
MCHC: 29.9 g/dL — ABNORMAL LOW (ref 30.0–36.0)
MCV: 104.5 fL — ABNORMAL HIGH (ref 80.0–100.0)
Platelets: 136 10*3/uL — ABNORMAL LOW (ref 150–400)
RBC: 4 MIL/uL (ref 3.87–5.11)
RDW: 13.6 % (ref 11.5–15.5)
WBC: 11.1 10*3/uL — ABNORMAL HIGH (ref 4.0–10.5)
nRBC: 0 % (ref 0.0–0.2)

## 2020-06-19 LAB — URINE CULTURE: Culture: >=100000 — AB

## 2020-06-19 LAB — BASIC METABOLIC PANEL
Anion gap: 10 (ref 5–15)
BUN: 32 mg/dL — ABNORMAL HIGH (ref 8–23)
CO2: 28 mmol/L (ref 22–32)
Calcium: 8.6 mg/dL — ABNORMAL LOW (ref 8.9–10.3)
Chloride: 99 mmol/L (ref 98–111)
Creatinine, Ser: 0.96 mg/dL (ref 0.44–1.00)
GFR calc Af Amer: >60 mL/min (ref >60)
GFR calc non Af Amer: 58 mL/min — ABNORMAL LOW (ref >60)
Glucose, Bld: 122 mg/dL — ABNORMAL HIGH (ref 70–99)
Potassium: 4.4 mmol/L (ref 3.5–5.1)
Sodium: 137 mmol/L (ref 135–145)

## 2020-06-19 MED ORDER — SODIUM CHLORIDE 0.9 % IV SOLN
INTRAVENOUS | Status: DC | PRN
  Administered 2020-06-19 – 2020-06-20 (×2): 1000 mL via INTRAVENOUS

## 2020-06-19 MED ORDER — GABAPENTIN 400 MG PO CAPS: 400.0000 mg | ORAL_CAPSULE | Freq: Four times a day (QID) | ORAL | Status: DC | PRN

## 2020-06-19 NOTE — Progress Notes (Signed)
PROGRESS NOTE    Tracy Espinoza  PJA:[id number] DOB: [birth year] DOA: 06/17/2020 PCP: Pcp, No    Brief Narrative:  Tracy Espinoza is a 73 y.o. female with medical history significant for hypertension, GERD, depression/anxiety, and chronic lymphedema of both lower extremities who presents to the ED for evaluation of rigors and right flank pain.  Patient states she was in her usual state of health until 3 AM this morning when she woke up with significant shakes/rigors.  She developed right flank pain.  She took it at home UTI test which she says was positive.  She called her PCP for further evaluation.  When she was at the medical office she was walking when her leg gave out and she fell to the ground.  She had an abrasion to her left elbow but denied any other injury or hitting her head.  She did not lose consciousness.  She has had some associated nausea with vomiting.  She denies any subjective fevers, chills, diaphoresis, dyspnea, cough, dysuria.  She reports chronic diarrhea which is unchanged.  She reports chronic back pain.  She has chronic lymphedema in both of her lower extremities which is unchanged.    Consultants:   none  Procedures:  CT 9/16 1. There is perinephric fat stranding about the right kidney. Multiple right-sided renal calculi with a small calculus in the urinary bladder, constellation of findings suggesting recently passed calculus, possibly with superimposed pyelonephritis. 2. Redemonstrated, chronic mild left hydronephrosis and hydroureter without obstructing etiology to the left ureterovesicular junction. Asymmetric atrophy of the left kidney. 3. Numerous bilateral renal lesions, generally fluid attenuation cysts, others incompletely characterized. 4. Atelectasis or consolidation of the right lung base, concerning for infection or aspiration.   Antimicrobials:   Ceftriaxone and azithromycin    Subjective: Starting to feel better.  Denies any right  flank pain.  Does states she feels weak and has a hard time walking.she denies sob, but to me appears sob  Objective: Vitals:   06/19/20 0131 06/19/20 0533 06/19/20 1137 06/19/20 1144  BP: (!) 108/48 135/64 (!) 143/109 (!) 115/56  Pulse: 62 63 89 70  Resp: 18 20 16    Temp: 98.6 F (37 C)     TempSrc: Oral     SpO2: 98% 100% (!) 85%   Weight:      Height:        Intake/Output Summary (Last 24 hours) at 06/19/2020 1658 Last data filed at 06/19/2020 1600 Gross per 24 hour  Intake 1422.76 ml  Output --  Net 1422.76 ml   Filed Weights   06/17/20 1323  Weight: 122 kg    Examination: Calm and comfortable, appears mildly sob CTA, no wheeze rales rhonchi's Regular S1-S2 no murmurs rubs gallops Soft nontender nondistended positive bowel sounds AA x O x3, grossly intact Not much edema Mood and affect appropriate in current setting      Data Reviewed: I have personally reviewed following labs and imaging studies  CBC: Recent Labs  Lab 06/17/20 1326 06/18/20 0453 06/19/20 1021  WBC 18.6* 13.0* 11.1*  HGB 13.8 12.1 12.5  HCT 42.3 38.1 41.8  MCV 97.2 100.0 104.5*  PLT 152 127* 341*   Basic Metabolic Panel: Recent Labs  Lab 06/17/20 1326 06/17/20 1820 06/18/20 0453 06/19/20 1021  NA 139  --  141 137  K 3.2*  --  4.0 4.4  CL 96*  --  102 99  CO2 32  --  32 28  GLUCOSE 139*  --  121* 122*  BUN 41*  --  40* 32*  CREATININE 1.29*  --  1.33* 0.96  CALCIUM 9.3  --  8.3* 8.6*  MG  --  1.4* 2.0  --    GFR: Estimated Creatinine Clearance: 63.1 mL/min (by C-G formula based on SCr of 0.96 mg/dL). Liver Function Tests: Recent Labs  Lab 06/17/20 1326  AST 25  ALT 13  ALKPHOS 34*  BILITOT 1.8*  PROT 7.1  ALBUMIN 3.8   No results for input(s): LIPASE, AMYLASE in the last 168 hours. No results for input(s): AMMONIA in the last 168 hours. Coagulation Profile: No results for input(s): INR, PROTIME in the last 168 hours. Cardiac Enzymes: No results for input(s):  CKTOTAL, CKMB, CKMBINDEX, TROPONINI in the last 168 hours. BNP (last 3 results) No results for input(s): PROBNP in the last 8760 hours. HbA1C: No results for input(s): HGBA1C in the last 72 hours. CBG: No results for input(s): GLUCAP in the last 168 hours. Lipid Profile: No results for input(s): CHOL, HDL, LDLCALC, TRIG, CHOLHDL, LDLDIRECT in the last 72 hours. Thyroid Function Tests: No results for input(s): TSH, T4TOTAL, FREET4, T3FREE, THYROIDAB in the last 72 hours. Anemia Panel: No results for input(s): VITAMINB12, FOLATE, FERRITIN, TIBC, IRON, RETICCTPCT in the last 72 hours. Sepsis Labs: No results for input(s): PROCALCITON, LATICACIDVEN in the last 168 hours.  Recent Results (from the past 240 hour(s))  Urine Culture     Status: Abnormal   Collection Time: 06/17/20  5:20 PM   Specimen: Urine, Random  Result Value Ref Range Status   Specimen Description   Final    URINE, RANDOM Performed at [facility], [address] [place], [place] [address]    Special Requests   Final    NONE Performed at [facility], [place], [place] [address]    Culture >=100,000 COLONIES/mL ESCHERICHIA COLI (A)  Final   Report Status 06/19/2020 FINAL  Final   Organism ID, Bacteria ESCHERICHIA COLI (A)  Final      Susceptibility   Escherichia coli - MIC*    AMPICILLIN 4 SENSITIVE Sensitive     CEFAZOLIN <=4 SENSITIVE Sensitive     CEFTRIAXONE <=0.25 SENSITIVE Sensitive     CIPROFLOXACIN <=0.25 SENSITIVE Sensitive     GENTAMICIN <=1 SENSITIVE Sensitive     IMIPENEM <=0.25 SENSITIVE Sensitive     NITROFURANTOIN <=16 SENSITIVE Sensitive     TRIMETH/SULFA <=20 SENSITIVE Sensitive     AMPICILLIN/SULBACTAM 4 SENSITIVE Sensitive     PIP/TAZO <=4 SENSITIVE Sensitive     * >=100,000 COLONIES/mL ESCHERICHIA COLI  SARS Coronavirus 2 by RT PCR (hospital order, performed in [place] hospital lab) Nasopharyngeal Nasopharyngeal Swab     Status: None   Collection  Time: 06/17/20  8:04 PM   Specimen: Nasopharyngeal Swab  Result Value Ref Range Status   SARS Coronavirus 2 NEGATIVE NEGATIVE Final    Comment: (NOTE) SARS-CoV-2 target nucleic acids are NOT DETECTED.  The SARS-CoV-2 RNA is generally detectable in upper and lower respiratory specimens during the acute phase of infection. The lowest concentration of SARS-CoV-2 viral copies this assay can detect is 250 copies / mL. A negative result does not preclude SARS-CoV-2 infection and should not be used as the sole basis for treatment or other patient management decisions.  A negative result may occur with improper specimen collection / handling, submission of specimen other than nasopharyngeal swab, presence of viral mutation(s) within the areas targeted by this assay, and inadequate  number of viral copies (<250 copies / mL). A negative result must be combined with clinical observations, patient history, and epidemiological information.  Fact Sheet for Patients:   StrictlyIdeas.no  Fact Sheet for Healthcare Providers: BankingDealers.co.za  This test is not yet approved or  cleared by the [place] FDA and has been authorized for detection and/or diagnosis of SARS-CoV-2 by FDA under an Emergency Use Authorization (EUA).  This EUA will remain in effect (meaning this test can be used) for the duration of the COVID-19 declaration under Section 564(b)(1) of the Act, 21 U.S.C. section 360bbb-3(b)(1), unless the authorization is terminated or revoked sooner.  Performed at [facility], [address] [place], [place] [address]   CULTURE, BLOOD (ROUTINE X 2) w Reflex to ID Panel     Status: None (Preliminary result)   Collection Time: 06/17/20  8:04 PM   Specimen: BLOOD  Result Value Ref Range Status   Specimen Description BLOOD RIGHT [facility]  Final   Special Requests   Final    BOTTLES DRAWN AEROBIC AND ANAEROBIC Blood Culture adequate  volume   Culture   Final    NO GROWTH 2 DAYS Performed at [facility], [address], [place], [place] [address]    Report Status PENDING  Incomplete  Culture, blood (Routine X 2) w Reflex to ID Panel     Status: None (Preliminary result)   Collection Time: 06/18/20  8:57 PM   Specimen: BLOOD  Result Value Ref Range Status   Specimen Description BLOOD BLOOD RIGHT WRIST  Final   Special Requests   Final    BOTTLES DRAWN AEROBIC ONLY Blood Culture results may not be optimal due to an inadequate volume of blood received in culture bottles   Culture   Final    NO GROWTH < 12 HOURS Performed at [facility], [address] [place], [place] [address]    Report Status PENDING  Incomplete         Radiology Studies: CT ABDOMEN PELVIS WO CONTRAST  Result Date: 06/17/2020 CLINICAL DATA:  UTI EXAM: CT ABDOMEN AND PELVIS WITHOUT CONTRAST TECHNIQUE: Multidetector CT imaging of the abdomen and pelvis was performed following the standard protocol without IV contrast. COMPARISON:  11/17/2015 FINDINGS: Lower chest: Atelectasis or consolidation of the right lung base. Underlying bandlike scarring. Hepatobiliary: No focal liver abnormality is seen. Status post cholecystectomy. No biliary dilatation. Pancreas: Unremarkable. No pancreatic ductal dilatation or surrounding inflammatory changes. Spleen: Normal in size. Unchanged low-attenuation cysts or hemangiomata of the spleen. Adrenals/Urinary Tract: Adrenal glands are unremarkable. Perinephric fat stranding about the right kidney. Redemonstrated asymmetric atrophy of the left kidney. There are numerous bilateral renal lesions, the majority of which are fluid attenuation simple cysts, others incompletely characterized by noncontrast CT although generally not changed compared to prior examination dated 11/17/2015. Multiple right sided renal calculi. No right-sided hydronephrosis. Redemonstrated, mild left hydronephrosis and hydroureter  without obstructing etiology to the left ureterovesicular junction. There is a tiny calculus within the urinary bladder lumen measuring no larger than 2 mm (series 2, image 76). Stomach/Bowel: Stomach is within normal limits. Appendix appears normal. No evidence of bowel wall thickening, distention, or inflammatory changes. Pancolonic diverticulosis. Vascular/Lymphatic: Aortic atherosclerosis. No enlarged abdominal or pelvic lymph nodes. Reproductive: Status post hysterectomy. Other: No abdominal wall hernia or abnormality. No abdominopelvic ascites. Musculoskeletal: No acute or significant osseous findings. IMPRESSION: 1. There is perinephric fat stranding about the right kidney. Multiple right-sided renal calculi with a small calculus in  the urinary bladder, constellation of findings suggesting recently passed calculus, possibly with superimposed pyelonephritis. 2. Redemonstrated, chronic mild left hydronephrosis and hydroureter without obstructing etiology to the left ureterovesicular junction. Asymmetric atrophy of the left kidney. 3. Numerous bilateral renal lesions, generally fluid attenuation cysts, others incompletely characterized. 4. Atelectasis or consolidation of the right lung base, concerning for infection or aspiration. Electronically Signed   By: Eddie Candle M.D.   On: 06/17/2020 17:53   X-ray chest PA and lateral  Result Date: 06/17/2020 CLINICAL DATA:  Instability when walking, hypertension EXAM: CHEST - 2 VIEW COMPARISON:  08/21/2016, 12/02/2019 FINDINGS: Frontal and lateral views of the chest demonstrate an unremarkable cardiac silhouette. No airspace disease, effusion, or pneumothorax. Minimal scarring at the lung bases. No acute bony abnormalities. IMPRESSION: 1. No acute intrathoracic process. Electronically Signed   By: Randa Ngo M.D.   On: 06/17/2020 19:48        Scheduled Meds: . aspirin EC  81 mg Oral Daily  . enoxaparin (LOVENOX) injection  40 mg Subcutaneous Q12H  .  metoprolol succinate  50 mg Oral Daily  . psyllium  1 packet Oral Daily  . sertraline  50 mg Oral Daily   Continuous Infusions: . sodium chloride Stopped (06/19/20 1033)  . azithromycin (ZITHROMAX) 500 MG IVPB (Vial-Mate [employer]) Stopped (06/19/20 0012)  . cefTRIAXone (ROCEPHIN)  IV Stopped (06/19/20 0370)    Assessment & Plan:   Principal Problem:   Pyelonephritis of right kidney Active Problems:   Hypertension   Anxiety   AKI (acute kidney injury) ([place])   Hypoxia   Lymphedema   Hypokalemia   Hypomagnesemia  Tracy Espinoza is a 74 y.o. female with medical history significant for hypertension, GERD, depression/anxiety, and chronic lymphedema of both lower extremities who is admitted with right-sided pyelonephritis   Right-sided pyelonephritis: UA +, CT findings suggestive of recently passed calculus and right pyelo- 9/18-urine culture E. coli, sensitivities are back We will follow up Follow-up blood  Leukocytosis improving We will continue on IV ceftriaxone    Hypoxia: Patient with intermittent hypoxia to 88% on room air while in the ED.  Improved with 2 L supplemental O2 via [place].  CT abdomen/pelvis showed atelectasis versus consolidation at the right lung base.  9/18-encourage using incentive spirometer  Out of bed to chair  Continue empiric azithromycin with ceftriaxone for questionable pneumonia versus aspiration versus atelectasis  Repeat chest x-ray today as she appears mildly short of breath difficult to say if she is volume overloaded  Initial chest x-ray was negative    AKI: In setting of right-sided pyelonephritis/UTI.,  Prerenal -9/18-improved  We will continue to hold HCTZ and losartan  DC IV fluids  Creatinine 0.96 Home HCTZ and losartan were held  Creatinine today is 1.33  We will start gentle hydration    Hypokalemia/hypomagnesemia: Was repleted, potassium stable  K 4.4, mg 2   Hypertension: Stable, continue Toprol-XL  Holding losartan  and HCTZ for AKI     Chronic lymphedema of bilateral lower extremities: chronic and stable Uses compression device at home Keep legs elevated   Anxiety: Continue sertraline and needed Ativan     Chronic pain: Continue home tramadol as needed.  Generalized weakness: Had fall prior to ED arrival due to leg giving out.  Patient reports she has a walker at home.   PT recommends SNF    DVT prophylaxis: Lovenox Code Status: Full Family Communication: None at bedside  Status is: Inpatient  Remains inpatient appropriate because:IV treatments appropriate  due to intensity of illness or inability to take PO   Dispo: The patient is from: Home              Anticipated d/c is to: SNF              Anticipated d/c date is: 2 days              Patient currently is not medically stable to d/c.will need SNF placement. On iv abx.case mx working on this            LOS: 2 days   Time spent: 45 minutes with more than 50% on [place], MD [facility] Pager [phone]  If 7PM-7AM, please contact night-coverage [email or web address] Password [facility] 06/19/2020, 4:58 PM

## 2020-06-19 NOTE — Progress Notes (Signed)
IS education attempted, pt sleeping. IS left in room for education later
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
## 2020-06-20 ENCOUNTER — Inpatient Hospital Stay: Payer: Medicare Other

## 2020-06-20 LAB — BRAIN NATRIURETIC PEPTIDE: B Natriuretic Peptide: 295.7 pg/mL — ABNORMAL HIGH (ref 0.0–100.0)

## 2020-06-20 LAB — CBC
HCT: 33.8 % — ABNORMAL LOW (ref 36.0–46.0)
Hemoglobin: 10.8 g/dL — ABNORMAL LOW (ref 12.0–15.0)
MCH: 32 pg (ref 26.0–34.0)
MCHC: 32 g/dL (ref 30.0–36.0)
MCV: 100.3 fL — ABNORMAL HIGH (ref 80.0–100.0)
Platelets: 113 10*3/uL — ABNORMAL LOW (ref 150–400)
RBC: 3.37 MIL/uL — ABNORMAL LOW (ref 3.87–5.11)
RDW: 13 % (ref 11.5–15.5)
WBC: 7.6 10*3/uL (ref 4.0–10.5)
nRBC: 0 % (ref 0.0–0.2)

## 2020-06-20 LAB — POTASSIUM: Potassium: 3.7 mmol/L (ref 3.5–5.1)

## 2020-06-20 MED ORDER — ENSURE ENLIVE PO LIQD
237.0000 mL | Freq: Two times a day (BID) | ORAL | Status: DC
  Administered 2020-06-21 – 2020-06-22 (×2): 237 mL via ORAL

## 2020-06-20 MED ORDER — FUROSEMIDE 10 MG/ML IJ SOLN
20.0000 mg | Freq: Once | INTRAMUSCULAR | Status: AC
  Administered 2020-06-20: 20 mg via INTRAVENOUS
  Filled 2020-06-20: qty 4

## 2020-06-20 MED ORDER — GABAPENTIN 400 MG PO CAPS: 400.0000 mg | ORAL_CAPSULE | Freq: Four times a day (QID) | ORAL | Status: DC | PRN

## 2020-06-20 MED ORDER — METOPROLOL SUCCINATE ER 50 MG PO TB24
50.0000 mg | ORAL_TABLET | Freq: Every day | ORAL | Status: DC
  Administered 2020-06-21 – 2020-06-22 (×2): 50 mg via ORAL
  Filled 2020-06-20 (×2): qty 1

## 2020-06-20 MED ORDER — GABAPENTIN 400 MG PO CAPS
400.0000 mg | ORAL_CAPSULE | Freq: Four times a day (QID) | ORAL | Status: DC
  Administered 2020-06-20 – 2020-06-22 (×7): 400 mg via ORAL
  Filled 2020-06-20 (×7): qty 1

## 2020-06-20 NOTE — Progress Notes (Signed)
Chaplain responded to an order requisition request. Chaplain discussed with Patient the request that had been placed for her.   Pt indicated she has had a history with the advanced care information and has had a discussion with her daughter about the materials at some point. Pt asked Chaplain to leave a copy of the information with her.   Chaplain actively listened learning Pt did not have any other emotional-spiritual care needs at the time. Chaplain made Pt aware of how a chaplain can be made availible to her at any time upon request.  Beverly Gust, MDiv Staff Chaplain-Relief

## 2020-06-20 NOTE — Progress Notes (Signed)
PROGRESS NOTE    Tracy Espinoza  PYK:[id number] DOB: [birth year] DOA: 06/17/2020 PCP: Pcp, No    Brief Narrative:  Tracy Espinoza is a 74 y.o. female with medical history significant for hypertension, GERD, depression/anxiety, and chronic lymphedema of both lower extremities who presents to the ED for evaluation of rigors and right flank pain.  Patient states she was in her usual state of health until 3 AM this morning when she woke up with significant shakes/rigors.  She developed right flank pain.  She took it at home UTI test which she says was positive.  She called her PCP for further evaluation.  When she was at the medical office she was walking when her leg gave out and she fell to the ground.  She had an abrasion to her left elbow but denied any other injury or hitting her head.  She did not lose consciousness.  She has had some associated nausea with vomiting.  She denies any subjective fevers, chills, diaphoresis, dyspnea, cough, dysuria.  She reports chronic diarrhea which is unchanged.  She reports chronic back pain.  She has chronic lymphedema in both of her lower extremities which is unchanged.    Consultants:   none  Procedures:  CT 9/16 1. There is perinephric fat stranding about the right kidney. Multiple right-sided renal calculi with a small calculus in the urinary bladder, constellation of findings suggesting recently passed calculus, possibly with superimposed pyelonephritis. 2. Redemonstrated, chronic mild left hydronephrosis and hydroureter without obstructing etiology to the left ureterovesicular junction. Asymmetric atrophy of the left kidney. 3. Numerous bilateral renal lesions, generally fluid attenuation cysts, others incompletely characterized. 4. Atelectasis or consolidation of the right lung base, concerning for infection or aspiration.   Antimicrobials:   Ceftriaxone and azithromycin    Subjective: Reports feeling mildly better, c/o back pain.  Still feels weak. This afternoon per nsg pt vomited and had nausea.  Objective: Vitals:   06/19/20 2111 06/20/20 0500 06/20/20 0541 06/20/20 1148  BP: (!) 120/47  (!) 136/48 (!) 141/65  Pulse: (!) 57  61 72  Resp: 20  12 16   Temp: 98.5 F (36.9 C)  98.5 F (36.9 C) 98.9 F (37.2 C)  TempSrc: Oral  Oral Oral  SpO2: 98%  94% 92%  Weight:  123.2 kg    Height:        Intake/Output Summary (Last 24 hours) at 06/20/2020 1558 Last data filed at 06/20/2020 1300 Gross per 24 hour  Intake 931.38 ml  Output 100 ml  Net 831.38 ml   Filed Weights   06/17/20 1323 06/20/20 0500  Weight: 122 kg 123.2 kg    Examination: Calm and comfortable, appears mildly sob CTA, no wheeze rales rhonchi's Regular S1-S2 no murmurs rubs gallops Soft nontender nondistended positive bowel sounds AA x O x3, grossly intact Not much edema Mood and affect appropriate in current setting      Data Reviewed: I have personally reviewed following labs and imaging studies  CBC: Recent Labs  Lab 06/17/20 1326 06/18/20 0453 06/19/20 1021 06/20/20 0631  WBC 18.6* 13.0* 11.1* 7.6  HGB 13.8 12.1 12.5 10.8*  HCT 42.3 38.1 41.8 33.8*  MCV 97.2 100.0 104.5* 100.3*  PLT 152 127* 136* 539*   Basic Metabolic Panel: Recent Labs  Lab 06/17/20 1326 06/17/20 1820 06/18/20 0453 06/19/20 1021 06/20/20 0631  NA 139  --  141 137  --   K 3.2*  --  4.0 4.4 3.7  CL 96*  --  102 99  --   CO2 32  --  32 28  --   GLUCOSE 139*  --  121* 122*  --   BUN 41*  --  40* 32*  --   CREATININE 1.29*  --  1.33* 0.96  --   CALCIUM 9.3  --  8.3* 8.6*  --   MG  --  1.4* 2.0  --   --    GFR: Estimated Creatinine Clearance: 63.4 mL/min (by C-G formula based on SCr of 0.96 mg/dL). Liver Function Tests: Recent Labs  Lab 06/17/20 1326  AST 25  ALT 13  ALKPHOS 34*  BILITOT 1.8*  PROT 7.1  ALBUMIN 3.8   No results for input(s): LIPASE, AMYLASE in the last 168 hours. No results for input(s): AMMONIA in the last 168  hours. Coagulation Profile: No results for input(s): INR, PROTIME in the last 168 hours. Cardiac Enzymes: No results for input(s): CKTOTAL, CKMB, CKMBINDEX, TROPONINI in the last 168 hours. BNP (last 3 results) No results for input(s): PROBNP in the last 8760 hours. HbA1C: No results for input(s): HGBA1C in the last 72 hours. CBG: No results for input(s): GLUCAP in the last 168 hours. Lipid Profile: No results for input(s): CHOL, HDL, LDLCALC, TRIG, CHOLHDL, LDLDIRECT in the last 72 hours. Thyroid Function Tests: No results for input(s): TSH, T4TOTAL, FREET4, T3FREE, THYROIDAB in the last 72 hours. Anemia Panel: No results for input(s): VITAMINB12, FOLATE, FERRITIN, TIBC, IRON, RETICCTPCT in the last 72 hours. Sepsis Labs: No results for input(s): PROCALCITON, LATICACIDVEN in the last 168 hours.  Recent Results (from the past 240 hour(s))  Urine Culture     Status: Abnormal   Collection Time: 06/17/20  5:20 PM   Specimen: Urine, Random  Result Value Ref Range Status   Specimen Description   Final    URINE, RANDOM Performed at [facility], [address] [place], [place] [address]    Special Requests   Final    NONE Performed at [facility], [place], [place] [address]    Culture >=100,000 COLONIES/mL ESCHERICHIA COLI (A)  Final   Report Status 06/19/2020 FINAL  Final   Organism ID, Bacteria ESCHERICHIA COLI (A)  Final      Susceptibility   Escherichia coli - MIC*    AMPICILLIN 4 SENSITIVE Sensitive     CEFAZOLIN <=4 SENSITIVE Sensitive     CEFTRIAXONE <=0.25 SENSITIVE Sensitive     CIPROFLOXACIN <=0.25 SENSITIVE Sensitive     GENTAMICIN <=1 SENSITIVE Sensitive     IMIPENEM <=0.25 SENSITIVE Sensitive     NITROFURANTOIN <=16 SENSITIVE Sensitive     TRIMETH/SULFA <=20 SENSITIVE Sensitive     AMPICILLIN/SULBACTAM 4 SENSITIVE Sensitive     PIP/TAZO <=4 SENSITIVE Sensitive     * >=100,000 COLONIES/mL ESCHERICHIA COLI  SARS Coronavirus 2  by RT PCR (hospital order, performed in [place] hospital lab) Nasopharyngeal Nasopharyngeal Swab     Status: None   Collection Time: 06/17/20  8:04 PM   Specimen: Nasopharyngeal Swab  Result Value Ref Range Status   SARS Coronavirus 2 NEGATIVE NEGATIVE Final    Comment: (NOTE) SARS-CoV-2 target nucleic acids are NOT DETECTED.  The SARS-CoV-2 RNA is generally detectable in upper and lower respiratory specimens during the acute phase of infection. The lowest concentration of SARS-CoV-2 viral copies this assay can detect is 250 copies / mL. A negative result does not preclude SARS-CoV-2 infection and should not be used as the sole basis for treatment  or other patient management decisions.  A negative result may occur with improper specimen collection / handling, submission of specimen other than nasopharyngeal swab, presence of viral mutation(s) within the areas targeted by this assay, and inadequate number of viral copies (<250 copies / mL). A negative result must be combined with clinical observations, patient history, and epidemiological information.  Fact Sheet for Patients:   StrictlyIdeas.no  Fact Sheet for Healthcare Providers: BankingDealers.co.za  This test is not yet approved or  cleared by the [place] FDA and has been authorized for detection and/or diagnosis of SARS-CoV-2 by FDA under an Emergency Use Authorization (EUA).  This EUA will remain in effect (meaning this test can be used) for the duration of the COVID-19 declaration under Section 564(b)(1) of the Act, 21 U.S.C. section 360bbb-3(b)(1), unless the authorization is terminated or revoked sooner.  Performed at [facility], [address] [place], [place] [address]   CULTURE, BLOOD (ROUTINE X 2) w Reflex to ID Panel     Status: None (Preliminary result)   Collection Time: 06/17/20  8:04 PM   Specimen: BLOOD  Result Value Ref Range Status    Specimen Description BLOOD RIGHT [facility]  Final   Special Requests   Final    BOTTLES DRAWN AEROBIC AND ANAEROBIC Blood Culture adequate volume   Culture   Final    NO GROWTH 3 DAYS Performed at [facility], [address], [place], [place] [address]    Report Status PENDING  Incomplete  Culture, blood (Routine X 2) w Reflex to ID Panel     Status: None (Preliminary result)   Collection Time: 06/18/20  8:57 PM   Specimen: BLOOD  Result Value Ref Range Status   Specimen Description BLOOD BLOOD RIGHT WRIST  Final   Special Requests   Final    BOTTLES DRAWN AEROBIC ONLY Blood Culture results may not be optimal due to an inadequate volume of blood received in culture bottles   Culture   Final    NO GROWTH 2 DAYS Performed at [facility], [address], [place],  [address]    Report Status PENDING  Incomplete         Radiology Studies: DG Chest Port 1 View  Result Date: 06/19/2020 CLINICAL DATA:  Shortness of breath. EXAM: PORTABLE CHEST 1 VIEW COMPARISON:  June 17, 2020 FINDINGS: Mild diffusely increased interstitial lung markings are seen. This is increased in severity when compared to the prior study. Mild, predominant stable areas of atelectasis and/or infiltrate are noted within the bilateral lung bases and lateral aspect of the mid left lung. There is no evidence of a pleural effusion or pneumothorax. The cardiac silhouette is moderately enlarged and unchanged in size. There is mild to moderate severity dextroscoliosis of the thoracic spine. Radiopaque surgical pins are seen overlying the right humeral head. IMPRESSION: Mild interstitial edema and/or pulmonary vascular congestion with predominant stable areas of atelectasis and/or infiltrate within the bilateral lung bases and lateral aspect of the mid left lung. Electronically Signed   By: Virgina Norfolk M.D.   On: 06/19/2020 19:16        Scheduled Meds: . aspirin EC  81 mg Oral Daily  .  enoxaparin (LOVENOX) injection  40 mg Subcutaneous Q12H  . [START ON 06/21/2020] metoprolol succinate  50 mg Oral Daily  . psyllium  1 packet Oral Daily  . sertraline  50 mg Oral Daily   Continuous Infusions: . sodium chloride 1,000 mL (06/20/20 0807)  .  cefTRIAXone (ROCEPHIN)  IV 2 g (06/20/20 0100)    Assessment & Plan:   Principal Problem:   Pyelonephritis of right kidney Active Problems:   Hypertension   Anxiety   AKI (acute kidney injury) ([place])   Hypoxia   Lymphedema   Hypokalemia   Hypomagnesemia  Tracy Espinoza is a 74 y.o. female with medical history significant for hypertension, GERD, depression/anxiety, and chronic lymphedema of both lower extremities who is admitted with right-sided pyelonephritis   Right-sided pyelonephritis: UA +, CT findings suggestive of recently passed calculus and right pyelo- 9/18-urine culture E. coli, sensitivities are back 9/19-leukocytosis improved, afebrile We will continue IV ceftriaxone     Hypoxia: Patient with intermittent hypoxia to 88% on room air while in the ED.  Improved with 2 L supplemental O2 via [place].  CT abdomen/pelvis showed atelectasis versus consolidation at the right lung base.  9/18-encourage using incentive spirometer  9/19-chest x-ray revealed mild interstitial edema/pulmonary vascular congestion.  BNP elevated .we will give Lasix 20 mg IV x1  Monitor renal function and volume status  Encourage out of bed to chair  We will continue empiric azithromycin and ceftriaxone for questionable pneumonia versus aspiration versus atelectasis   Nausea/Vomiting- etiology unclear. Will check abdominal xr.  Antiemetic.   AKI: In setting of right-sided pyelonephritis/UTI.,  Prerenal -9/18-improved  We will check a.m. labs  Continue to hold HCTZ and losartan IV fluids were discontinued    Hypokalemia/hypomagnesemia: Was repleted and continues to be stable  We will continue to monitor periodically    Essential  hypertension: Controlled, continue Toprol-XL  Holding losartan and HCTZ for AKI    Chronic lymphedema of bilateral lower extremities: Chronic and stable  Uses compression device at home  Continue leg elevation here    Anxiety: Continue sertraline and needed Ativan     Chronic pain: Continue home tramadol as needed.  Generalized weakness: Had fall prior to ED arrival due to leg giving out.  Patient reports she has a walker at home.   PT recommends SNF    DVT prophylaxis: Lovenox Code Status: Full Family Communication: None at bedside  Status is: Inpatient  Remains inpatient appropriate because:IV treatments appropriate due to intensity of illness or inability to take PO   Dispo: The patient is from: Home              Anticipated d/c is to: SNF              Anticipated d/c date is: 2 days              Patient currently is not medically stable to d/c.will need SNF placement. On iv abx.case mx working on this, vomited /is nauseous.            LOS: 3 days   Time spent: 45 minutes with more than 50% on [place], MD [facility] Pager [phone]  If 7PM-7AM, please contact night-coverage [email or web address] Password [facility] 06/20/2020, 3:58 PM

## 2020-06-20 NOTE — TOC Progression Note (Signed)
Transition of Care [facility]) - Progression Note    Patient Details  Name: Tracy Espinoza MRN: [id number] Date of Birth: 1946-01-14  [facility] CM/SW Contact  Zigmund Daniel Dorian Pod, RN Phone Number:[phone] 06/20/2020, 9:38 AM  Clinical Narrative:    RN spoke with both the pt and her supportive daughter Tracy Espinoza) concerning PT recommendations for SNF placement for rehab. CMS choice provided and formal PASRR# [id number] A obtained for facility placement. FL2 signed by the attending and currently awaiting facility acceptance.   TOC will continue to follow for discharge disposition.   Expected Discharge Plan: [place] Barriers to Discharge: Continued Medical Work up  Expected Discharge Plan and Services Expected Discharge Plan: [place] Acute Care Choice: [place]                                         Social Determinants of Health (SDOH) Interventions    Readmission Risk Interventions No flowsheet data found.

## 2020-06-20 NOTE — Progress Notes (Signed)
Patient episode of nausea and vomited. Zofran given. Patient wanted to hold off on getting up to chair until nausea subsides. Will continue to monitor.   Fuller Mandril, RN

## 2020-06-21 ENCOUNTER — Ambulatory Visit: Payer: Medicare Other

## 2020-06-21 LAB — BASIC METABOLIC PANEL
Anion gap: 8 (ref 5–15)
BUN: 19 mg/dL (ref 8–23)
CO2: 34 mmol/L — ABNORMAL HIGH (ref 22–32)
Calcium: 8.6 mg/dL — ABNORMAL LOW (ref 8.9–10.3)
Chloride: 101 mmol/L (ref 98–111)
Creatinine, Ser: 0.72 mg/dL (ref 0.44–1.00)
GFR calc Af Amer: >60 mL/min (ref >60)
GFR calc non Af Amer: >60 mL/min (ref >60)
Glucose, Bld: 96 mg/dL (ref 70–99)
Potassium: 3.7 mmol/L (ref 3.5–5.1)
Sodium: 143 mmol/L (ref 135–145)

## 2020-06-21 LAB — CBC
HCT: 36.7 % (ref 36.0–46.0)
Hemoglobin: 11.4 g/dL — ABNORMAL LOW (ref 12.0–15.0)
MCH: 31.6 pg (ref 26.0–34.0)
MCHC: 31.1 g/dL (ref 30.0–36.0)
MCV: 101.7 fL — ABNORMAL HIGH (ref 80.0–100.0)
Platelets: UNDETERMINED 10*3/uL (ref 150–400)
RBC: 3.61 MIL/uL — ABNORMAL LOW (ref 3.87–5.11)
RDW: 13 % (ref 11.5–15.5)
WBC: 6.3 10*3/uL (ref 4.0–10.5)
nRBC: 0 % (ref 0.0–0.2)

## 2020-06-21 MED ORDER — RISAQUAD PO CAPS
1.0000 | ORAL_CAPSULE | Freq: Three times a day (TID) | ORAL | Status: DC
  Administered 2020-06-21 – 2020-06-22 (×4): 1 via ORAL
  Filled 2020-06-21 (×4): qty 1

## 2020-06-21 MED ORDER — MELATONIN 5 MG PO TABS
5.0000 mg | ORAL_TABLET | Freq: Every day | ORAL | Status: DC
  Administered 2020-06-22: 5 mg via ORAL
  Filled 2020-06-21: qty 1

## 2020-06-21 NOTE — TOC Progression Note (Signed)
Transition of Care [facility]) - Progression Note    Patient Details  Name: Tracy Espinoza MRN: [id number] Date of Birth: 11-22-1945  Transition of Care [facility]) CM/SW Contact  Beverly Sessions, RN Phone Number: 06/21/2020, 4:06 PM  Clinical Narrative:    Currently no bed offers for SNF.  Bed search extended    Expected Discharge Plan: [place] Barriers to Discharge: Continued Medical Work up  Expected Discharge Plan and Services Expected Discharge Plan: [place] Acute Care Choice: [place]                                         Social Determinants of Health (SDOH) Interventions    Readmission Risk Interventions No flowsheet data found.

## 2020-06-21 NOTE — Progress Notes (Signed)
Occupational Therapy Treatment Patient Details Name: Tracy Espinoza MRN: [id number] DOB: [birth year] Today's Date: 06/21/2020    History of present illness Pt is a 74 y.o. female with medical history significant for hypertension, GERD, depression/anxiety, and chronic lymphedema of both lower extremities who presents to the ED for evaluation of rigors and right flank pain. Also had a fall at home while walking her dog. Workup suggestive of UTI.   OT comments  Tracy Espinoza continues to present with generalized weakness and limited endurance that impacts her ability to safely and independently complete functional tasks.  She was pleasant and agreeable to today's session focused on self care.  OTR provided stand by assist for pt to complete supine > sit with HOB elevated.  OTR provided setup assist for pt to complete grooming tasks while seated EOB (oral hygiene, washing face, applying deodorant and lotion, combing hair).  OTR then provided min A to lift BLE in sit > supine transfer.  Pt with heavy use of bedrails for all bed mobility, but able to bring self closer to [facility] with min assist from OTR.  Tracy Espinoza had increased work of breathing with activity, but her SpO2 remained stable at 92-95% on 3L O2.  OTR educated pt on energy conservation strategies including pursed lip breathing.  Tracy Espinoza will continue to benefit from skilled OT services in acute setting to address functional strengthening, endurance, and safety and independence in ADLs.  Discharge recommendations remain appropriate.   Follow Up Recommendations  SNF    Equipment Recommendations  Other (comment) (TBD)    Recommendations for Other Services      Precautions / Restrictions Precautions Precautions: Fall Restrictions Weight Bearing Restrictions: No Other Position/Activity Restrictions: monitor O2 stats       Mobility Bed Mobility Overal bed mobility: Needs Assistance Bed Mobility: Supine to Sit;Sit to Supine Rolling: Min assist    Supine to sit: Supervision;HOB elevated Sit to supine: Min assist;HOB elevated   General bed mobility comments: heavy use of rails, minA for LEs  Transfers Overall transfer level: Needs assistance Equipment used: Rolling walker (2 wheeled) Transfers: Sit to/from Stand Sit to Stand: Min guard         General transfer comment: stabilization of RW    Balance Overall balance assessment: Needs assistance Sitting-balance support: Feet supported Sitting balance-Leahy Scale: Fair       Standing balance-Leahy Scale: Zero Standing balance comment: reliant on UE support                           ADL either performed or assessed with clinical judgement   ADL                                         General ADL Comments: Pt able to complete seated grooming tasks with setup assist, OTR provided max A to doff socks at bedlevel.  Suspect pt will require mod-max assist for all LB ADLs including dressing, bathing, toileting, and transfers.     Vision Patient Visual Report: No change from baseline     Perception     Praxis      Cognition Arousal/Alertness: Awake/alert Behavior During Therapy: WFL for tasks assessed/performed Overall Cognitive Status: Within Functional Limits for tasks assessed  General Comments: SOB with conversation        Exercises General Exercises - Lower Extremity Ankle Circles/Pumps: AROM;Both;10 reps Heel Slides: AROM;Strengthening;Both;10 reps Hip ABduction/ADduction: AROM;Strengthening;Both;10 reps Other Exercises Other Exercises: OTR provided supervision for supine > sit, min A for sit > supine, setup assist for seated grooming tasks, education on energy conservation strategies   Shoulder Instructions       General Comments increased work of breathing with activity and conversation, SpO2 ranged from 92-95% on 3L nasal cannula O2    Pertinent Vitals/ Pain       Pain  Assessment: No/denies pain Pain Intervention(s): Limited activity within patient's tolerance;Monitored during session  Home Living                                          Prior Functioning/Environment              Frequency  Min 1X/week        Progress Toward Goals  OT Goals(current goals can now be found in the care plan section)  Progress towards OT goals: Progressing toward goals  Acute Rehab OT Goals Patient Stated Goal: to go home OT Goal Formulation: With patient Time For Goal Achievement: 07/02/20 Potential to Achieve Goals: [place] Discharge plan remains appropriate;Frequency remains appropriate    Co-evaluation                 AM-PAC OT "6 Clicks" Daily Activity     Outcome Measure   Help from another person eating meals?: None Help from another person taking care of personal grooming?: A Little Help from another person toileting, which includes using toliet, bedpan, or urinal?: A Lot Help from another person bathing (including washing, rinsing, drying)?: A Lot Help from another person to put on and taking off regular upper body clothing?: A Little Help from another person to put on and taking off regular lower body clothing?: A Lot 6 Click Score: 16    End of Session Equipment Utilized During Treatment: Oxygen  OT Visit Diagnosis: Muscle weakness (generalized) (M62.81)   Activity Tolerance Patient tolerated treatment well   Patient Left in bed;with call bell/phone within reach   Nurse Communication          Time: 5361-4431 OT Time Calculation (min): 42 min  Charges: OT General Charges $OT Visit: 1 Visit OT Treatments $Self Care/Home Management : 38-52 mins  Myrtie Hawk Shyanna Klingel, OTR/L 06/21/20, 4:26 PM

## 2020-06-21 NOTE — Progress Notes (Signed)
Physical Therapy Treatment Patient Details Name: Tracy Espinoza MRN: [id number] DOB: 10-05-1945 Today's Date: 06/21/2020    History of Present Illness Pt is a 74 y.o. female with medical history significant for hypertension, GERD, depression/anxiety, and chronic lymphedema of both lower extremities who presents to the ED for evaluation of rigors and right flank pain. Also had a fall at home while walking her dog. Workup suggestive of UTI.    PT Comments    Patient easily woken, agreeable to PT, denied pain. Pt able to perform a few supine exercises with verbal cues for LE, improved muscle activation and quality of movement of LEs compared to previous session. Supine to sit with heavy use of grab bars and supervision. Able to sit with fair balance (once repositioned to midline with minA for RLE). Pt complained of lightheadedness and nausea, vitals WFLs (on [place] throughout session). Sit <> stand with RW and CGA (PT stabilizing RW). Pt was able to take a few lateral steps at EOB to assist prior to needing to sit due to fatigue and nausea. Returned to supine minA for LE, and bed pan placed at patient request. The patient would benefit from further skilled PT intervention to continue to progress towards goals. Recommendation remains appropriate.    Follow Up Recommendations  SNF     Equipment Recommendations  Other (comment) (TBD)    Recommendations for Other Services       Precautions / Restrictions Precautions Precautions: Fall Restrictions Weight Bearing Restrictions: No    Mobility  Bed Mobility Overal bed mobility: Needs Assistance Bed Mobility: Rolling Rolling: Min assist   Supine to sit: HOB elevated;Supervision Sit to supine: Min assist;HOB elevated   General bed mobility comments: heavy use of rails, minA for LEs  Transfers Overall transfer level: Needs assistance Equipment used: Rolling walker (2 wheeled) Transfers: Sit to/from Stand Sit to Stand: Min guard          General transfer comment: stabilization of RW  Ambulation/Gait             General Gait Details: deferred due to nausea/fatigue   Stairs             Wheelchair Mobility    Modified Rankin (Stroke Patients Only)       Balance Overall balance assessment: Needs assistance Sitting-balance support: Feet supported Sitting balance-Leahy Scale: Fair       Standing balance-Leahy Scale: Zero Standing balance comment: reliant on UE support                            Cognition Arousal/Alertness: Awake/alert Behavior During Therapy: WFL for tasks assessed/performed Overall Cognitive Status: Within Functional Limits for tasks assessed                                 General Comments: some difficulty with word finding noted. SOB with conversation      Exercises General Exercises - Lower Extremity Ankle Circles/Pumps: AROM;Both;10 reps Heel Slides: AROM;Strengthening;Both;10 reps Hip ABduction/ADduction: AROM;Strengthening;Both;10 reps    General Comments        Pertinent Vitals/Pain Pain Assessment: No/denies pain    Home Living                      Prior Function            PT Goals (current goals can now be found in the care  plan section) Progress towards PT goals: Progressing toward goals    Frequency    Min 2X/week      PT Plan Current plan remains appropriate    Co-evaluation              AM-PAC PT "6 Clicks" Mobility   Outcome Measure  Help needed turning from your back to your side while in a flat bed without using bedrails?: A Lot Help needed moving from lying on your back to sitting on the side of a flat bed without using bedrails?: A Lot Help needed moving to and from a bed to a chair (including a wheelchair)?: A Lot Help needed standing up from a chair using your arms (e.g., wheelchair or bedside chair)?: A Little Help needed to walk in hospital room?: A Lot Help needed climbing 3-5 steps  with a railing? : Total 6 Click Score: 12    End of Session Equipment Utilized During Treatment: Gait belt Activity Tolerance: Patient limited by fatigue Patient left: in bed;with call bell/phone within reach;with bed alarm set Nurse Communication: Mobility status PT Visit Diagnosis: Other abnormalities of gait and mobility (R26.89);Difficulty in walking, not elsewhere classified (R26.2);Muscle weakness (generalized) (M62.81)     Time: 2820-8138 PT Time Calculation (min) (ACUTE ONLY): 34 min  Charges:  $Therapeutic Exercise: 23-37 mins                     Lieutenant Diego PT, DPT 4:17 PM,06/21/20

## 2020-06-21 NOTE — Care Management Important Message (Signed)
Important Message  Patient Details  Name: Tracy Espinoza MRN: [id number] Date of Birth: [birth year]   Medicare Important Message Given:  Yes     Dannette Barbara 06/21/2020, 12:08 PM

## 2020-06-21 NOTE — Progress Notes (Signed)
PROGRESS NOTE    Tracy Espinoza  EUM:[id number] DOB: Sep 17, 1946 DOA: 06/17/2020 PCP: Pcp, No    Brief Narrative:  Tracy Espinoza is a 74 y.o. female with medical history significant for hypertension, GERD, depression/anxiety, and chronic lymphedema of both lower extremities who presents to the ED for evaluation of rigors and right flank pain.  Patient states she was in her usual state of health until 3 AM this morning when she woke up with significant shakes/rigors.  She developed right flank pain.  She took it at home UTI test which she says was positive.  She called her PCP for further evaluation.  When she was at the medical office she was walking when her leg gave out and she fell to the ground.  She had an abrasion to her left elbow but denied any other injury or hitting her head.  She did not lose consciousness.  She has had some associated nausea with vomiting.  She denies any subjective fevers, chills, diaphoresis, dyspnea, cough, dysuria.  She reports chronic diarrhea which is unchanged.  She reports chronic back pain.  She has chronic lymphedema in both of her lower extremities which is unchanged.    Consultants:   none  Procedures:  CT 9/16 1. There is perinephric fat stranding about the right kidney. Multiple right-sided renal calculi with a small calculus in the urinary bladder, constellation of findings suggesting recently passed calculus, possibly with superimposed pyelonephritis. 2. Redemonstrated, chronic mild left hydronephrosis and hydroureter without obstructing etiology to the left ureterovesicular junction. Asymmetric atrophy of the left kidney. 3. Numerous bilateral renal lesions, generally fluid attenuation cysts, others incompletely characterized. 4. Atelectasis or consolidation of the right lung base, concerning for infection or aspiration.   Antimicrobials:   Ceftriaxone and azithromycin    Subjective: Patient has no complaints.  States she is  feeling better.  No shortness of breath.no further n/v  Objective: Vitals:   06/20/20 2046 06/21/20 0500 06/21/20 0530 06/21/20 0626  BP: (!) 114/53  (!) 154/65   Pulse: 61  62 (!) 58  Resp: 16  17   Temp: 98.2 F (36.8 C)  98.7 F (37.1 C)   TempSrc: Oral  Oral   SpO2: 97%  98% 99%  Weight:  122.6 kg    Height:        Intake/Output Summary (Last 24 hours) at 06/21/2020 0750 Last data filed at 06/20/2020 1900 Gross per 24 hour  Intake 237.17 ml  Output 800 ml  Net -562.83 ml   Filed Weights   06/17/20 1323 06/20/20 0500 06/21/20 0500  Weight: 122 kg 123.2 kg 122.6 kg    Examination: Appears less dyspnic with conversation today Clear to auscultation, no wheeze rales rhonchi's  RRR S1-S2 no murmurs rubs gallops  Soft nontender nondistended positive bowel sounds  Alert oriented x3 grossly intact  Mild edema bilaterally  Mood and affect appropriate in current setting         Data Reviewed: I have personally reviewed following labs and imaging studies  CBC: Recent Labs  Lab 06/17/20 1326 06/18/20 0453 06/19/20 1021 06/20/20 0631  WBC 18.6* 13.0* 11.1* 7.6  HGB 13.8 12.1 12.5 10.8*  HCT 42.3 38.1 41.8 33.8*  MCV 97.2 100.0 104.5* 100.3*  PLT 152 127* 136* 540*   Basic Metabolic Panel: Recent Labs  Lab 06/17/20 1326 06/17/20 1820 06/18/20 0453 06/19/20 1021 06/20/20 0631  NA 139  --  141 137  --   K 3.2*  --  4.0 4.4  3.7  CL 96*  --  102 99  --   CO2 32  --  32 28  --   GLUCOSE 139*  --  121* 122*  --   BUN 41*  --  40* 32*  --   CREATININE 1.29*  --  1.33* 0.96  --   CALCIUM 9.3  --  8.3* 8.6*  --   MG  --  1.4* 2.0  --   --    GFR: Estimated Creatinine Clearance: 63.2 mL/min (by C-G formula based on SCr of 0.96 mg/dL). Liver Function Tests: Recent Labs  Lab 06/17/20 1326  AST 25  ALT 13  ALKPHOS 34*  BILITOT 1.8*  PROT 7.1  ALBUMIN 3.8   No results for input(s): LIPASE, AMYLASE in the last 168 hours. No results for input(s): AMMONIA  in the last 168 hours. Coagulation Profile: No results for input(s): INR, PROTIME in the last 168 hours. Cardiac Enzymes: No results for input(s): CKTOTAL, CKMB, CKMBINDEX, TROPONINI in the last 168 hours. BNP (last 3 results) No results for input(s): PROBNP in the last 8760 hours. HbA1C: No results for input(s): HGBA1C in the last 72 hours. CBG: No results for input(s): GLUCAP in the last 168 hours. Lipid Profile: No results for input(s): CHOL, HDL, LDLCALC, TRIG, CHOLHDL, LDLDIRECT in the last 72 hours. Thyroid Function Tests: No results for input(s): TSH, T4TOTAL, FREET4, T3FREE, THYROIDAB in the last 72 hours. Anemia Panel: No results for input(s): VITAMINB12, FOLATE, FERRITIN, TIBC, IRON, RETICCTPCT in the last 72 hours. Sepsis Labs: No results for input(s): PROCALCITON, LATICACIDVEN in the last 168 hours.  Recent Results (from the past 240 hour(s))  Urine Culture     Status: Abnormal   Collection Time: 06/17/20  5:20 PM   Specimen: Urine, Random  Result Value Ref Range Status   Specimen Description   Final    URINE, RANDOM Performed at [facility], [address] [place], [place] [address]    Special Requests   Final    NONE Performed at [facility], [place], [place] [address]    Culture >=100,000 COLONIES/mL ESCHERICHIA COLI (A)  Final   Report Status 06/19/2020 FINAL  Final   Organism ID, Bacteria ESCHERICHIA COLI (A)  Final      Susceptibility   Escherichia coli - MIC*    AMPICILLIN 4 SENSITIVE Sensitive     CEFAZOLIN <=4 SENSITIVE Sensitive     CEFTRIAXONE <=0.25 SENSITIVE Sensitive     CIPROFLOXACIN <=0.25 SENSITIVE Sensitive     GENTAMICIN <=1 SENSITIVE Sensitive     IMIPENEM <=0.25 SENSITIVE Sensitive     NITROFURANTOIN <=16 SENSITIVE Sensitive     TRIMETH/SULFA <=20 SENSITIVE Sensitive     AMPICILLIN/SULBACTAM 4 SENSITIVE Sensitive     PIP/TAZO <=4 SENSITIVE Sensitive     * >=100,000 COLONIES/mL ESCHERICHIA COLI    SARS Coronavirus 2 by RT PCR (hospital order, performed in [facility] hospital lab) Nasopharyngeal Nasopharyngeal Swab     Status: None   Collection Time: 06/17/20  8:04 PM   Specimen: Nasopharyngeal Swab  Result Value Ref Range Status   SARS Coronavirus 2 NEGATIVE NEGATIVE Final    Comment: (NOTE) SARS-CoV-2 target nucleic acids are NOT DETECTED.  The SARS-CoV-2 RNA is generally detectable in upper and lower respiratory specimens during the acute phase of infection. The lowest concentration of SARS-CoV-2 viral copies this assay can detect is 250 copies / mL. A negative result does not preclude SARS-CoV-2 infection and should not  be used as the sole basis for treatment or other patient management decisions.  A negative result may occur with improper specimen collection / handling, submission of specimen other than nasopharyngeal swab, presence of viral mutation(s) within the areas targeted by this assay, and inadequate number of viral copies (<250 copies / mL). A negative result must be combined with clinical observations, patient history, and epidemiological information.  Fact Sheet for Patients:   StrictlyIdeas.no  Fact Sheet for Healthcare Providers: BankingDealers.co.za  This test is not yet approved or  cleared by the [place] FDA and has been authorized for detection and/or diagnosis of SARS-CoV-2 by FDA under an Emergency Use Authorization (EUA).  This EUA will remain in effect (meaning this test can be used) for the duration of the COVID-19 declaration under Section 564(b)(1) of the Act, 21 U.S.C. section 360bbb-3(b)(1), unless the authorization is terminated or revoked sooner.  Performed at [facility], [address] [place], [place] [address]   CULTURE, BLOOD (ROUTINE X 2) w Reflex to ID Panel     Status: None (Preliminary result)   Collection Time: 06/17/20  8:04 PM   Specimen: BLOOD  Result Value  Ref Range Status   Specimen Description BLOOD RIGHT [facility]  Final   Special Requests   Final    BOTTLES DRAWN AEROBIC AND ANAEROBIC Blood Culture adequate volume   Culture   Final    NO GROWTH 4 DAYS Performed at [facility], [address], [place], [place] [address]    Report Status PENDING  Incomplete  Culture, blood (Routine X 2) w Reflex to ID Panel     Status: None (Preliminary result)   Collection Time: 06/18/20  8:57 PM   Specimen: BLOOD  Result Value Ref Range Status   Specimen Description BLOOD BLOOD RIGHT WRIST  Final   Special Requests   Final    BOTTLES DRAWN AEROBIC ONLY Blood Culture results may not be optimal due to an inadequate volume of blood received in culture bottles   Culture   Final    NO GROWTH 3 DAYS Performed at [facility], [address], [place], [place] [address]    Report Status PENDING  Incomplete         Radiology Studies: DG Abd 1 View  Result Date: 06/20/2020 CLINICAL DATA:  Nausea and vomiting, history of pyelonephritis EXAM: ABDOMEN - 1 VIEW COMPARISON:  CT from 06/17/2020 FINDINGS: Scattered large and small bowel gas is noted. No obstructive changes are seen. No free air is noted. Postsurgical changes are noted in the lower lumbar spine as well as prior cholecystectomy. No soft tissue abnormality is noted. IMPRESSION: No acute abnormality noted. Electronically Signed   By: Inez Catalina M.D.   On: 06/20/2020 18:59   DG Chest Port 1 View  Result Date: 06/19/2020 CLINICAL DATA:  Shortness of breath. EXAM: PORTABLE CHEST 1 VIEW COMPARISON:  June 17, 2020 FINDINGS: Mild diffusely increased interstitial lung markings are seen. This is increased in severity when compared to the prior study. Mild, predominant stable areas of atelectasis and/or infiltrate are noted within the bilateral lung bases and lateral aspect of the mid left lung. There is no evidence of a pleural effusion or pneumothorax. The cardiac silhouette is  moderately enlarged and unchanged in size. There is mild to moderate severity dextroscoliosis of the thoracic spine. Radiopaque surgical pins are seen overlying the right humeral head. IMPRESSION: Mild interstitial edema and/or pulmonary vascular congestion with predominant stable areas of atelectasis and/or  infiltrate within the bilateral lung bases and lateral aspect of the mid left lung. Electronically Signed   By: Virgina Norfolk M.D.   On: 06/19/2020 19:16        Scheduled Meds: . aspirin EC  81 mg Oral Daily  . enoxaparin (LOVENOX) injection  40 mg Subcutaneous Q12H  . feeding supplement (ENSURE ENLIVE)  237 mL Oral BID BM  . gabapentin  400 mg Oral QID  . metoprolol succinate  50 mg Oral Daily  . psyllium  1 packet Oral Daily  . sertraline  50 mg Oral Daily   Continuous Infusions: . sodium chloride Stopped (06/20/20 1020)  . cefTRIAXone (ROCEPHIN)  IV 2 g (06/21/20 6045)    Assessment & Plan:   Principal Problem:   Pyelonephritis of right kidney Active Problems:   Hypertension   Anxiety   AKI (acute kidney injury) ([place])   Hypoxia   Lymphedema   Hypokalemia   Hypomagnesemia  Tracy Espinoza is a 74 y.o. female with medical history significant for hypertension, GERD, depression/anxiety, and chronic lymphedema of both lower extremities who is admitted with right-sided pyelonephritis   Right-sided pyelonephritis: UA +, CT findings suggestive of recently passed calculus and right pyelo- 9/18-urine culture E. coli, sensitivities are back 9/20- clinically improving. No fever or leukocytosis.  Will continue IV ceftriaxone    Hypoxia: Patient with intermittent hypoxia to 88% on room air while in the ED.  Improved with 2 L supplemental O2 via [place].  CT abdomen/pelvis showed atelectasis versus consolidation at the right lung base.  9/18-encourage using incentive spirometer  9/19-chest x-ray revealed mild interstitial edema/pulmonary vascular congestion.  BNP elevated .we  will give Lasix 20 mg IV x1  9/20-appears less dyspnic today, clinically improved. . Renal function is good. Will try to wean off oxygen.  Encouraged instenvie spirometer. On empiric azithromycin and cefriaxone for ?pna .  Azithromycin was completed.  We will continue with ceftriaxone OOB to chair as tolerated.    Nausea/Vomiting- etiology unclear.  Unsure if it was medication induced.  No further nausea vomiting.  Abdominal x-ray only with gas.   AKI: Mention In setting of right-sided pyelonephritis/UTI.,  Prerenal,  improved with IV fluid initially We will continue to hold HCTZ and losartan   Hypokalemia/hypomagnesemia: Was repleted and stable Continue to monitor    Essential hypertension: Well controlled, continue Toprol-XL  Continue holding losartan and HCTZ for AKI on admission     Chronic lymphedema of bilateral lower extremities: Stable  Uses compression device at home  Continue leg elevation     Anxiety: Continue sertraline and needed Ativan     Chronic pain: Continue home tramadol as needed.  Generalized weakness: Had fall prior to ED arrival due to leg giving out.  Patient reports she has a walker at home.   PT recommends SNF    DVT prophylaxis: Lovenox Code Status: Full Family Communication: None at bedside  Status is: Inpatient  Remains inpatient appropriate because requiring IV medications and oxygen.   Dispo: The patient is from: Home              Anticipated d/c is to: SNF              Anticipated d/c date is: 2 days              Patient currently is not medically stable, will try to wean off oxygen. SNF pending placement for safe d/c.  LOS: 4 days   Time spent: 35 minutes with more than 50% on [employer], MD [facility] Pager [phone]  If 7PM-7AM, please contact night-coverage [email or web address] Password [email or web address] 06/21/2020, 7:50 AM

## 2020-06-22 DIAGNOSIS — K219 Gastro-esophageal reflux disease without esophagitis: Secondary | ICD-10-CM | POA: Diagnosis not present

## 2020-06-22 DIAGNOSIS — R197 Diarrhea, unspecified: Secondary | ICD-10-CM | POA: Diagnosis not present

## 2020-06-22 DIAGNOSIS — Z6841 Body Mass Index (BMI) 40.0 and over, adult: Secondary | ICD-10-CM | POA: Diagnosis not present

## 2020-06-22 DIAGNOSIS — N202 Calculus of kidney with calculus of ureter: Secondary | ICD-10-CM | POA: Diagnosis not present

## 2020-06-22 DIAGNOSIS — M48062 Spinal stenosis, lumbar region with neurogenic claudication: Secondary | ICD-10-CM | POA: Diagnosis not present

## 2020-06-22 DIAGNOSIS — R5381 Other malaise: Secondary | ICD-10-CM | POA: Diagnosis not present

## 2020-06-22 DIAGNOSIS — I25810 Atherosclerosis of coronary artery bypass graft(s) without angina pectoris: Secondary | ICD-10-CM | POA: Diagnosis not present

## 2020-06-22 DIAGNOSIS — F419 Anxiety disorder, unspecified: Secondary | ICD-10-CM | POA: Diagnosis not present

## 2020-06-22 DIAGNOSIS — R0902 Hypoxemia: Secondary | ICD-10-CM | POA: Diagnosis not present

## 2020-06-22 DIAGNOSIS — G4700 Insomnia, unspecified: Secondary | ICD-10-CM | POA: Diagnosis not present

## 2020-06-22 DIAGNOSIS — N12 Tubulo-interstitial nephritis, not specified as acute or chronic: Secondary | ICD-10-CM | POA: Diagnosis not present

## 2020-06-22 DIAGNOSIS — R279 Unspecified lack of coordination: Secondary | ICD-10-CM | POA: Diagnosis not present

## 2020-06-22 DIAGNOSIS — R498 Other voice and resonance disorders: Secondary | ICD-10-CM | POA: Diagnosis not present

## 2020-06-22 DIAGNOSIS — M6281 Muscle weakness (generalized): Secondary | ICD-10-CM | POA: Diagnosis not present

## 2020-06-22 DIAGNOSIS — J439 Emphysema, unspecified: Secondary | ICD-10-CM | POA: Diagnosis not present

## 2020-06-22 DIAGNOSIS — F411 Generalized anxiety disorder: Secondary | ICD-10-CM | POA: Diagnosis not present

## 2020-06-22 DIAGNOSIS — I10 Essential (primary) hypertension: Secondary | ICD-10-CM | POA: Diagnosis not present

## 2020-06-22 DIAGNOSIS — F418 Other specified anxiety disorders: Secondary | ICD-10-CM | POA: Diagnosis not present

## 2020-06-22 DIAGNOSIS — I890 Lymphedema, not elsewhere classified: Secondary | ICD-10-CM | POA: Diagnosis not present

## 2020-06-22 DIAGNOSIS — N10 Acute tubulo-interstitial nephritis: Secondary | ICD-10-CM | POA: Diagnosis not present

## 2020-06-22 LAB — CULTURE, BLOOD (ROUTINE X 2)
Culture: NO GROWTH
Special Requests: ADEQUATE

## 2020-06-22 MED ORDER — FUROSEMIDE 20 MG PO TABS
20.0000 mg | ORAL_TABLET | Freq: Every day | ORAL | Status: DC
  Administered 2020-06-22: 20 mg via ORAL
  Filled 2020-06-22: qty 1

## 2020-06-22 MED ORDER — CEPHALEXIN 500 MG PO CAPS: 500.0000 mg | ORAL_CAPSULE | Freq: Three times a day (TID) | ORAL | 0 refills | Status: AC

## 2020-06-22 MED ORDER — RISAQUAD PO CAPS
1.0000 | ORAL_CAPSULE | Freq: Three times a day (TID) | ORAL | Status: DC
  Administered 2020-06-22: 1 via ORAL
  Filled 2020-06-22 (×3): qty 1

## 2020-06-22 MED ORDER — ENSURE ENLIVE PO LIQD
237.0000 mL | Freq: Three times a day (TID) | ORAL | Status: DC
  Administered 2020-06-22: 237 mL via ORAL

## 2020-06-22 MED ORDER — ENSURE ENLIVE PO LIQD: 237.0000 mL | Freq: Three times a day (TID) | ORAL | 12 refills | Status: AC

## 2020-06-22 MED ORDER — BACID PO TABS: 2.0000 | ORAL_TABLET | Freq: Three times a day (TID) | ORAL | Status: AC

## 2020-06-22 MED ORDER — LOSARTAN POTASSIUM 25 MG PO TABS: 25.0000 mg | ORAL_TABLET | Freq: Every day | ORAL | Status: DC

## 2020-06-22 MED ORDER — CEPHALEXIN 500 MG PO CAPS: 500.0000 mg | ORAL_CAPSULE | Freq: Three times a day (TID) | ORAL | Status: DC

## 2020-06-22 MED ORDER — MELATONIN 5 MG PO TABS: 5.0000 mg | ORAL_TABLET | Freq: Every day | ORAL | 0 refills | Status: AC

## 2020-06-22 NOTE — Progress Notes (Signed)
RN called and gave report to Collins at [facility]. Patient will be transported by EMS and taken to facility. Patient given verbal instructions on discharge. Patient verbalized understanding. No further questions or concerns at this time.   Thresa Ross, RN

## 2020-06-22 NOTE — Progress Notes (Signed)
OT Cancellation Note  Patient Details Name: KYANNE RIALS MRN: [id number] DOB: April 16, 1946   Cancelled Treatment:    Reason Eval/Treat Not Completed: Fatigue/lethargy limiting ability to participate. Pt refused, saying that she is "exhausted." She expects to transfer to SNF this evening and anticipates this will be very tiring for her, so does not want does not wish to participate in any more therapy today.   Josiah Lobo, PhD, [place], OTR/L ascom [phone] 06/22/20, 4:04 PM

## 2020-06-22 NOTE — Discharge Summary (Signed)
Tracy Espinoza XBM:[id number] DOB: 1945-12-06 DOA: 06/17/2020  PCP: Pcp, No  Admit date: 06/17/2020 Discharge date: 06/22/2020  Admitted From: home Disposition: [facility]  Recommendations for Outpatient Follow-up:  1. Follow up with PCP in 1 week 2. Please obtain BMP/CBC 2 days 3. Adjust blood pressure medications accordingly 4. Needs to use incentive spirometer regularly 5. Try to wean off of oxygen, currently on 2L     Discharge Condition:Stable CODE STATUS:FULL  Diet recommendation: Heart Healthy Brief/Interim Summary: Per H&P:Tracy Espinoza is a 74 y.o. female with medical history significant for hypertension, GERD, depression/anxiety, and chronic lymphedema of both lower extremities who presents to the ED for evaluation of rigors and right flank pain.She took it at home UTI test which she says was positive.  She called her PCP for further evaluation.  When she was at the medical office she was walking when her leg gave out and she fell to the ground.  She had an abrasion to her left elbow but denied any other injury or hitting her head.  She did not lose consciousness.She reports chronic diarrhea which is unchanged.  She reports chronic back pain.  She has chronic lymphedema in both of her lower extremities which is unchanged.  She was found with positive urine culture revealing E. coli and right-sided pyelonephritis.  He was started on IV antibiotics with ceftriaxone   Right-sided pyelonephritis: UA +, urine culture positive for E. coli  CT findings suggestive of recently passed calculus and right pyelonephritis Initially started on IV ceftriaxone and will switch to p.o. on discharge to complete course     Hypoxia: Patient with intermittent hypoxia to 88% on room air while in the ED. Improved with 2 L supplemental O2 via [place]. CT abdomen/pelvis showed atelectasis versus consolidation at the right lung base.  Started on incentive spirometer  9/19-chest x-ray revealed mild  interstitial edema/pulmonary vascular congestion.  BNP elevated .given Lasix 20 mg  prn Received another lasix dose today . Will need to wean off 02 as tolerated when ambulates keeping 02 sat >90.  Check blood work in 2 days , to make sure renal function is stable Currently was treated with Romycin and ceftriaxone for questionable pneumonia    Nausea/Vomiting- etiology unclear.  Unsure if it was medication induced.  No further nausea vomiting.  Abdominal x-ray only with gas.   AKI:  Resolved In setting of right-sided pyelonephritis/UTI.,  Prerenal,  improved with IV fluid HCTZ was discontinued.  We will start losartan 25 mg daily monitoring labs closely   Hypokalemia/hypomagnesemia: Was repleted and stable Continue to monitor    Essential hypertension: Continue beta-blockers and start losartan 25 mg daily Discontinue HCTZ at discharge due to AKI       Chronic lymphedema of bilateral lower extremities: Stable  Uses compression device at home  Continue leg elevation     Anxiety: Continue sertraline, Ativan and other outpatient medications    Chronic pain: Continue home tramadol as needed.  Generalized weakness: Had fall prior to ED arrival due to leg giving out. Patient reports she has a walker at home.  PT OT recommends SNF     Discharge Diagnoses:  Principal Problem:   Pyelonephritis of right kidney Active Problems:   Hypertension   Anxiety   AKI (acute kidney injury) ([place])   Hypoxia   Lymphedema   Hypokalemia   Hypomagnesemia    Discharge Instructions  Discharge Instructions    Call MD for:  temperature >100.4   Complete by: As  directed    Diet - low sodium heart healthy   Complete by: As directed    Discharge instructions   Complete by: As directed    Need to discuss blood pressue with pcp F/u with pcp in one week Need blood work   Increase activity slowly   Complete by: As directed      Allergies as of 06/22/2020       Reactions   Tape Itching, Dermatitis, Rash   Daptomycin Other (See Comments)   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   aspirin EC 81 MG tablet Take 81 mg by mouth daily.   Calcium 600+D 600-800 MG-UNIT Tabs Generic drug: Calcium Carb-Cholecalciferol Take 1 tablet by mouth daily.   cephALEXin 500 MG capsule Commonly known as: KEFLEX Take 1 capsule (500 mg total) by mouth every 8 (eight) hours for 10 days. Start taking on: June 23, 2020   Cranberry 425 MG Caps Take 425 mg by mouth daily.   feeding supplement (ENSURE ENLIVE) Liqd Take 237 mLs by mouth 3 (three) times daily between meals.   gabapentin 400 MG capsule Commonly known as: NEURONTIN TAKE UP TO 4 CAPSULES BY  MOUTH DAILY AS NEEDED What changed:   how much to take  how to take this  when to take this  reasons to take this   lactobacillus acidophilus Tabs tablet Take 2 tablets by mouth 3 (three) times daily. Replaces: [facility] DAILY PROBIOTIC PO   LORazepam 0.5 MG tablet Commonly known as: ATIVAN TAKE 1 TABLET BY MOUTH  DAILY . TAKE ONLY FOR BAD  DAYS. What changed: See the new instructions.   losartan 25 MG tablet Commonly known as: COZAAR Take 1 tablet (25 mg total) by mouth daily. What changed:   medication strength  how much to take   melatonin 5 MG Tabs Take 1 tablet (5 mg total) by mouth at bedtime.   Metamucil 28 % packet Generic drug: psyllium Take 1  packet by mouth daily.   metoprolol succinate 50 MG 24 hr tablet Commonly known as: TOPROL-XL Take 1 tablet (50 mg total) by mouth daily.   multivitamin tablet Take 1 tablet by mouth daily.   nystatin cream Commonly known as: MYCOSTATIN Apply 1 application topically 2 (two) times daily.   nystatin powder Commonly known as: MYCOSTATIN/NYSTOP Apply 1 application topically 2 (two) times daily.   OMEGA-3 FISH OIL PO Take 2 capsules by mouth daily.   phenazopyridine 100 MG tablet Commonly known as: PYRIDIUM Take 100 mg by mouth 3 (three) times daily as needed.   sertraline 50 MG tablet Commonly known as: ZOLOFT TAKE 1 TABLET BY MOUTH EVERY DAY   traMADol 50 MG tablet Commonly known as: ULTRAM Take 1 tablet (50 mg total) by mouth 2 (two) times daily as needed.   traZODone 100 MG tablet Commonly known as: DESYREL TAKE 1 TABLET BY MOUTH AT  BEDTIME   vitamin C 1000 MG tablet Take 1,000  mg by mouth daily.       Contact information for after-discharge care    Destination    [facility] Preferred SNF .   Service: Skilled Nursing Contact information: [employer] [phone]                 Allergies  Allergen Reactions  . Tape Itching, Dermatitis and Rash  . 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 Band-Aid Plus Antibiotic [Bacitracin-Polymyxin B] Other (See Comments)    Unknown  . Iodinated Diagnostic Agents Itching  . Morphine Sulfate Itching  . Vancomycin Rash    Consultations:     Procedures/Studies: CT ABDOMEN PELVIS WO CONTRAST  Result Date: 06/17/2020 CLINICAL DATA:  UTI EXAM: CT ABDOMEN AND PELVIS WITHOUT CONTRAST TECHNIQUE: Multidetector CT imaging of the abdomen and pelvis was performed following the standard protocol without IV contrast. COMPARISON:  11/17/2015 FINDINGS: Lower chest: Atelectasis or consolidation of the right lung base. Underlying  bandlike scarring. Hepatobiliary: No focal liver abnormality is seen. Status post cholecystectomy. No biliary dilatation. Pancreas: Unremarkable. No pancreatic ductal dilatation or surrounding inflammatory changes. Spleen: Normal in size. Unchanged low-attenuation cysts or hemangiomata of the spleen. Adrenals/Urinary Tract: Adrenal glands are unremarkable. Perinephric fat stranding about the right kidney. Redemonstrated asymmetric atrophy of the left kidney. There are numerous bilateral renal lesions, the majority of which are fluid attenuation simple cysts, others incompletely characterized by noncontrast CT although generally not changed compared to prior examination dated 11/17/2015. Multiple right sided renal calculi. No right-sided hydronephrosis. Redemonstrated, mild left hydronephrosis and hydroureter without obstructing etiology to the left ureterovesicular junction. There is a tiny calculus within the urinary bladder lumen measuring no larger than 2 mm (series 2, image 76). Stomach/Bowel: Stomach is within normal limits. Appendix appears normal. No evidence of bowel wall thickening, distention, or inflammatory changes. Pancolonic diverticulosis. Vascular/Lymphatic: Aortic atherosclerosis. No enlarged abdominal or pelvic lymph nodes. Reproductive: Status post hysterectomy. Other: No abdominal wall hernia or abnormality. No abdominopelvic ascites. Musculoskeletal: No acute or significant osseous findings. IMPRESSION: 1. There is perinephric fat stranding about the right kidney. Multiple right-sided renal calculi with a small calculus in the urinary bladder, constellation of findings suggesting recently passed calculus, possibly with superimposed pyelonephritis. 2. Redemonstrated, chronic mild left hydronephrosis and hydroureter without obstructing etiology to the left ureterovesicular junction. Asymmetric atrophy of the left kidney. 3. Numerous bilateral renal lesions, generally fluid attenuation cysts,  others incompletely characterized. 4. Atelectasis or consolidation of the right lung base, concerning for infection or aspiration. Electronically Signed   By: Eddie Candle M.D.   On: 06/17/2020 17:53   X-ray chest PA and lateral  Result Date: 06/17/2020 CLINICAL DATA:  Instability when walking, hypertension EXAM: CHEST - 2 VIEW COMPARISON:  08/21/2016, 12/02/2019 FINDINGS: Frontal and lateral views of the chest demonstrate an unremarkable cardiac silhouette. No airspace disease, effusion, or pneumothorax. Minimal scarring at the lung bases. No acute bony abnormalities. IMPRESSION: 1. No acute intrathoracic process. Electronically Signed   By: Randa Ngo M.D.   On: 06/17/2020 19:48   DG Abd 1 View  Result Date: 06/20/2020 CLINICAL DATA:  Nausea and vomiting, history of pyelonephritis EXAM: ABDOMEN - 1 VIEW COMPARISON:  CT from 06/17/2020 FINDINGS: Scattered large and small bowel gas is noted. No obstructive changes are seen. No free air is noted. Postsurgical changes are noted in the lower lumbar spine as well as prior cholecystectomy. No soft tissue abnormality is noted.  IMPRESSION: No acute abnormality noted. Electronically Signed   By: Inez Catalina M.D.   On: 06/20/2020 18:59   DG Chest Port 1 View  Result Date: 06/19/2020 CLINICAL DATA:  Shortness of breath. EXAM: PORTABLE CHEST 1 VIEW COMPARISON:  June 17, 2020 FINDINGS: Mild diffusely increased interstitial lung markings are seen. This is increased in severity when compared to the prior study. Mild, predominant stable areas of atelectasis and/or infiltrate are noted within the bilateral lung bases and lateral aspect of the mid left lung. There is no evidence of a pleural effusion or pneumothorax. The cardiac silhouette is moderately enlarged and unchanged in size. There is mild to moderate severity dextroscoliosis of the thoracic spine. Radiopaque surgical pins are seen overlying the right humeral head. IMPRESSION: Mild interstitial edema  and/or pulmonary vascular congestion with predominant stable areas of atelectasis and/or infiltrate within the bilateral lung bases and lateral aspect of the mid left lung. Electronically Signed   By: Virgina Norfolk M.D.   On: 06/19/2020 19:16       Subjective: Has no complaints.  Denies shortness of breath.  wants something at bedtime to be able to sleep  Discharge Exam: Vitals:   06/21/20 2003 06/22/20 0421  BP:  133/64  Pulse: 64 (!) 54  Resp:  20  Temp:  97.9 F (36.6 C)  SpO2: 96% 99%   Vitals:   06/21/20 1956 06/21/20 2003 06/22/20 0353 06/22/20 0421  BP: 139/70   133/64  Pulse: 67 64  (!) 54  Resp: 20   20  Temp: 98.6 F (37 C)   97.9 F (36.6 C)  TempSrc: Oral   Oral  SpO2: (!) 88% 96%  99%  Weight:   123 kg   Height:        General: Pt is alert, awake, not in acute distress Cardiovascular: RRR, S1/S2 +, no rubs, no gallops Respiratory: CTA bilaterally, no wheezing, no rhonchi Abdominal: Soft, NT, ND, bowel sounds + Extremities: no cyanosis  Chronic lymphedema-but better    The results of significant diagnostics from this hospitalization (including imaging, microbiology, ancillary and laboratory) are listed below for reference.     Microbiology: Recent Results (from the past 240 hour(s))  Urine Culture     Status: Abnormal   Collection Time: 06/17/20  5:20 PM   Specimen: Urine, Random  Result Value Ref Range Status   Specimen Description   Final    URINE, RANDOM Performed at [facility], [address], [place], [place] [address]    Special Requests   Final    NONE Performed at [facility] [place], [place], [place] [address]    Culture >=100,000 COLONIES/mL ESCHERICHIA COLI (A)  Final   Report Status 06/19/2020 FINAL  Final   Organism ID, Bacteria ESCHERICHIA COLI (A)  Final      Susceptibility   Escherichia coli - MIC*    AMPICILLIN 4 SENSITIVE Sensitive     CEFAZOLIN <=4 SENSITIVE Sensitive     CEFTRIAXONE  <=0.25 SENSITIVE Sensitive     CIPROFLOXACIN <=0.25 SENSITIVE Sensitive     GENTAMICIN <=1 SENSITIVE Sensitive     IMIPENEM <=0.25 SENSITIVE Sensitive     NITROFURANTOIN <=16 SENSITIVE Sensitive     TRIMETH/SULFA <=20 SENSITIVE Sensitive     AMPICILLIN/SULBACTAM 4 SENSITIVE Sensitive     PIP/TAZO <=4 SENSITIVE Sensitive     * >=100,000 COLONIES/mL ESCHERICHIA COLI  SARS Coronavirus 2 by RT PCR (hospital order, performed in [facility] hospital lab) Nasopharyngeal  Nasopharyngeal Swab     Status: None   Collection Time: 06/17/20  8:04 PM   Specimen: Nasopharyngeal Swab  Result Value Ref Range Status   SARS Coronavirus 2 NEGATIVE NEGATIVE Final    Comment: (NOTE) SARS-CoV-2 target nucleic acids are NOT DETECTED.  The SARS-CoV-2 RNA is generally detectable in upper and lower respiratory specimens during the acute phase of infection. The lowest concentration of SARS-CoV-2 viral copies this assay can detect is 250 copies / mL. A negative result does not preclude SARS-CoV-2 infection and should not be used as the sole basis for treatment or other patient management decisions.  A negative result may occur with improper specimen collection / handling, submission of specimen other than nasopharyngeal swab, presence of viral mutation(s) within the areas targeted by this assay, and inadequate number of viral copies (<250 copies / mL). A negative result must be combined with clinical observations, patient history, and epidemiological information.  Fact Sheet for Patients:   StrictlyIdeas.no  Fact Sheet for Healthcare Providers: BankingDealers.co.za  This test is not yet approved or  cleared by the [place] FDA and has been authorized for detection and/or diagnosis of SARS-CoV-2 by FDA under an Emergency Use Authorization (EUA).  This EUA will remain in effect (meaning this test can be used) for the duration of the COVID-19 declaration under  Section 564(b)(1) of the Act, 21 U.S.C. section 360bbb-3(b)(1), unless the authorization is terminated or revoked sooner.  Performed at [facility], [address] [place], [place] [address]   CULTURE, BLOOD (ROUTINE X 2) w Reflex to ID Panel     Status: None   Collection Time: 06/17/20  8:04 PM   Specimen: BLOOD  Result Value Ref Range Status   Specimen Description BLOOD RIGHT [facility]  Final   Special Requests   Final    BOTTLES DRAWN AEROBIC AND ANAEROBIC Blood Culture adequate volume   Culture   Final    NO GROWTH 5 DAYS Performed at [facility], [address] [place], [place] [address]    Report Status 06/22/2020 FINAL  Final  Culture, blood (Routine X 2) w Reflex to ID Panel     Status: None (Preliminary result)   Collection Time: 06/18/20  8:57 PM   Specimen: BLOOD  Result Value Ref Range Status   Specimen Description BLOOD BLOOD RIGHT WRIST  Final   Special Requests   Final    BOTTLES DRAWN AEROBIC ONLY Blood Culture results may not be optimal due to an inadequate volume of blood received in culture bottles   Culture   Final    NO GROWTH 4 DAYS Performed at [facility], [address] [place], [place] [address]    Report Status PENDING  Incomplete     Labs: BNP (last 3 results) Recent Labs    06/20/20 0631  BNP 408.1*   Basic Metabolic Panel: Recent Labs  Lab 06/17/20 1326 06/17/20 1820 06/18/20 0453 06/19/20 1021 06/20/20 0631 06/21/20 0730  NA 139  --  141 137  --  143  K 3.2*  --  4.0 4.4 3.7 3.7  CL 96*  --  102 99  --  101  CO2 32  --  32 28  --  34*  GLUCOSE 139*  --  121* 122*  --  96  BUN 41*  --  40* 32*  --  19  CREATININE 1.29*  --  1.33* 0.96  --  0.72  CALCIUM 9.3  --  8.3* 8.6*  --  8.6*  MG  --  1.4* 2.0  --   --   --    Liver Function Tests: Recent Labs  Lab 06/17/20 1326  AST 25  ALT 13  ALKPHOS 34*  BILITOT 1.8*  PROT 7.1  ALBUMIN 3.8   No results for input(s): LIPASE, AMYLASE in the last  168 hours. No results for input(s): AMMONIA in the last 168 hours. CBC: Recent Labs  Lab 06/17/20 1326 06/18/20 0453 06/19/20 1021 06/20/20 0631 06/21/20 0730  WBC 18.6* 13.0* 11.1* 7.6 6.3  HGB 13.8 12.1 12.5 10.8* 11.4*  HCT 42.3 38.1 41.8 33.8* 36.7  MCV 97.2 100.0 104.5* 100.3* 101.7*  PLT 152 127* 136* 113* PLATELET CLUMPS NOTED ON SMEAR, UNABLE TO ESTIMATE   Cardiac Enzymes: No results for input(s): CKTOTAL, CKMB, CKMBINDEX, TROPONINI in the last 168 hours. BNP: Invalid input(s): POCBNP CBG: No results for input(s): GLUCAP in the last 168 hours. D-Dimer No results for input(s): DDIMER in the last 72 hours. Hgb A1c No results for input(s): HGBA1C in the last 72 hours. Lipid Profile No results for input(s): CHOL, HDL, LDLCALC, TRIG, CHOLHDL, LDLDIRECT in the last 72 hours. Thyroid function studies No results for input(s): TSH, T4TOTAL, T3FREE, THYROIDAB in the last 72 hours.  Invalid input(s): FREET3 Anemia work up No results for input(s): VITAMINB12, FOLATE, FERRITIN, TIBC, IRON, RETICCTPCT in the last 72 hours. Urinalysis    Component Value Date/Time   COLORURINE AMBER (A) 06/17/2020 1651   APPEARANCEUR CLOUDY (A) 06/17/2020 1651   APPEARANCEUR Cloudy (A) 05/11/2020 1425   LABSPEC 1.014 06/17/2020 1651   PHURINE 5.0 06/17/2020 1651   GLUCOSEU NEGATIVE 06/17/2020 1651   HGBUR MODERATE (A) 06/17/2020 1651   BILIRUBINUR NEGATIVE 06/17/2020 1651   BILIRUBINUR Negative 05/11/2020 1425   KETONESUR NEGATIVE 06/17/2020 1651   PROTEINUR >=300 (A) 06/17/2020 1651   NITRITE POSITIVE (A) 06/17/2020 1651   LEUKOCYTESUR MODERATE (A) 06/17/2020 1651   Sepsis Labs Invalid input(s): PROCALCITONIN,  WBC,  LACTICIDVEN Microbiology Recent Results (from the past 240 hour(s))  Urine Culture     Status: Abnormal   Collection Time: 06/17/20  5:20 PM   Specimen: Urine, Random  Result Value Ref Range Status   Specimen Description   Final    URINE, RANDOM Performed at [facility], [address] [place], [place] [address]    Special Requests   Final    NONE Performed at [facility], [address] [place], [place] [address]    Culture >=100,000 COLONIES/mL ESCHERICHIA COLI (A)  Final   Report Status 06/19/2020 FINAL  Final   Organism ID, Bacteria ESCHERICHIA COLI (A)  Final      Susceptibility   Escherichia coli - MIC*    AMPICILLIN 4 SENSITIVE Sensitive     CEFAZOLIN <=4 SENSITIVE Sensitive     CEFTRIAXONE <=0.25 SENSITIVE Sensitive     CIPROFLOXACIN <=0.25 SENSITIVE Sensitive     GENTAMICIN <=1 SENSITIVE Sensitive     IMIPENEM <=0.25 SENSITIVE Sensitive     NITROFURANTOIN <=16 SENSITIVE Sensitive     TRIMETH/SULFA <=20 SENSITIVE Sensitive     AMPICILLIN/SULBACTAM 4 SENSITIVE Sensitive     PIP/TAZO <=4 SENSITIVE Sensitive     * >=100,000 COLONIES/mL ESCHERICHIA COLI  SARS Coronavirus 2 by RT PCR (hospital order, performed in [facility] hospital lab) Nasopharyngeal Nasopharyngeal Swab     Status: None   Collection Time: 06/17/20  8:04 PM   Specimen: Nasopharyngeal Swab  Result Value Ref Range Status   SARS  Coronavirus 2 NEGATIVE NEGATIVE Final    Comment: (NOTE) SARS-CoV-2 target nucleic acids are NOT DETECTED.  The SARS-CoV-2 RNA is generally detectable in upper and lower respiratory specimens during the acute phase of infection. The lowest concentration of SARS-CoV-2 viral copies this assay can detect is 250 copies / mL. A negative result does not preclude SARS-CoV-2 infection and should not be used as the sole basis for treatment or other patient management decisions.  A negative result may occur with improper specimen collection / handling, submission of specimen other than nasopharyngeal swab, presence of viral mutation(s) within the areas targeted by this assay, and inadequate number of viral copies (<250 copies / mL). A negative result must be combined with clinical observations, patient history, and epidemiological  information.  Fact Sheet for Patients:   StrictlyIdeas.no  Fact Sheet for Healthcare Providers: BankingDealers.co.za  This test is not yet approved or  cleared by the [place] FDA and has been authorized for detection and/or diagnosis of SARS-CoV-2 by FDA under an Emergency Use Authorization (EUA).  This EUA will remain in effect (meaning this test can be used) for the duration of the COVID-19 declaration under Section 564(b)(1) of the Act, 21 U.S.C. section 360bbb-3(b)(1), unless the authorization is terminated or revoked sooner.  Performed at [facility], [address] [place], [place] [address]   CULTURE, BLOOD (ROUTINE X 2) w Reflex to ID Panel     Status: None   Collection Time: 06/17/20  8:04 PM   Specimen: BLOOD  Result Value Ref Range Status   Specimen Description BLOOD RIGHT [facility]  Final   Special Requests   Final    BOTTLES DRAWN AEROBIC AND ANAEROBIC Blood Culture adequate volume   Culture   Final    NO GROWTH 5 DAYS Performed at [facility], [address] [place], [place] [address]    Report Status 06/22/2020 FINAL  Final  Culture, blood (Routine X 2) w Reflex to ID Panel     Status: None (Preliminary result)   Collection Time: 06/18/20  8:57 PM   Specimen: BLOOD  Result Value Ref Range Status   Specimen Description BLOOD BLOOD RIGHT WRIST  Final   Special Requests   Final    BOTTLES DRAWN AEROBIC ONLY Blood Culture results may not be optimal due to an inadequate volume of blood received in culture bottles   Culture   Final    NO GROWTH 4 DAYS Performed at [facility], [address] [place], [place] [address]    Report Status PENDING  Incomplete     Time coordinating discharge: Over 30 minutes  SIGNED:   Nolberto Hanlon, MD  [facility] 06/22/2020, 2:55 PM Pager   If 7PM-7AM, please contact night-coverage [email or web address] Password [email or web address]

## 2020-06-22 NOTE — TOC Transition Note (Signed)
Transition of Care [facility]) - CM/SW Discharge Note   Patient Details  Name: ROMANITA FAGER MRN: [id number] Date of Birth: Nov 26, 1945  [facility]) CM/SW Contact:  Beverly Sessions, RN Phone Number: 06/22/2020, 3:27 PM   Clinical Narrative:    Bed offers presented.  Patient selected [facility].  They can accept today DC information sent in hub  EMS packet printed.  EMS transport arranged for 5:30 Daughter and son notified   Bedside RN to call report   Final next level of care: Skilled Nursing Facility Barriers to Discharge: No Barriers Identified   Patient Goals and CMS Choice   CMS Medicare.gov Compare Post Acute Care list provided to:: Other (Comment Required) (Daughter- [name]) Choice offered to / list presented to : Adult Children  Discharge Placement              Patient chooses bed at: [facility] [place] Patient to be transferred to facility by: EMS Name of family member notified: [name] and Shanon Brow Patient and family notified of of transfer: 06/22/20  Discharge Plan and Services     Post Acute Care Choice: [place]                               Social Determinants of Health (SDOH) Interventions     Readmission Risk Interventions No flowsheet data found.

## 2020-06-23 LAB — CULTURE, BLOOD (ROUTINE X 2): Culture: NO GROWTH

## 2020-06-24 ENCOUNTER — Ambulatory Visit: Payer: Medicare Other

## 2020-06-24 DIAGNOSIS — N10 Acute tubulo-interstitial nephritis: Secondary | ICD-10-CM | POA: Diagnosis not present

## 2020-06-24 DIAGNOSIS — R0902 Hypoxemia: Secondary | ICD-10-CM | POA: Diagnosis not present

## 2020-06-24 DIAGNOSIS — F418 Other specified anxiety disorders: Secondary | ICD-10-CM | POA: Diagnosis not present

## 2020-06-24 DIAGNOSIS — I10 Essential (primary) hypertension: Secondary | ICD-10-CM | POA: Diagnosis not present

## 2020-06-25 ENCOUNTER — Other Ambulatory Visit: Payer: Self-pay | Admitting: Family Medicine

## 2020-06-28 ENCOUNTER — Ambulatory Visit: Payer: No Typology Code available for payment source | Admitting: Nurse Practitioner

## 2020-07-01 ENCOUNTER — Other Ambulatory Visit: Payer: Self-pay | Admitting: *Deleted

## 2020-07-01 DIAGNOSIS — R0902 Hypoxemia: Secondary | ICD-10-CM | POA: Diagnosis not present

## 2020-07-01 DIAGNOSIS — F419 Anxiety disorder, unspecified: Secondary | ICD-10-CM | POA: Diagnosis not present

## 2020-07-01 DIAGNOSIS — N202 Calculus of kidney with calculus of ureter: Secondary | ICD-10-CM | POA: Diagnosis not present

## 2020-07-01 DIAGNOSIS — I10 Essential (primary) hypertension: Secondary | ICD-10-CM | POA: Diagnosis not present

## 2020-07-01 DIAGNOSIS — N10 Acute tubulo-interstitial nephritis: Secondary | ICD-10-CM | POA: Diagnosis not present

## 2020-07-01 DIAGNOSIS — I890 Lymphedema, not elsewhere classified: Secondary | ICD-10-CM | POA: Diagnosis not present

## 2020-07-01 DIAGNOSIS — R197 Diarrhea, unspecified: Secondary | ICD-10-CM | POA: Diagnosis not present

## 2020-07-01 NOTE — Patient Outreach (Signed)
[facility] Post- Acute Care Coordinator follow up. Member screened for potential [facility] Care Management needs as a benefit of [employer].  Per Patient Pearletha Forge member resides in [facility].   Communication sent to [facility] social workers to request update on transtion plans.   Will continue to follow while member resides in SNF.   Marthenia Rolling, MSN-Ed, RN,BSN [place] Acute Care Coordinator [phone] [facility]) [phone]  (Toll free office)

## 2020-07-05 ENCOUNTER — Ambulatory Visit: Payer: Medicare Other

## 2020-07-08 ENCOUNTER — Ambulatory Visit: Payer: Medicare Other

## 2020-07-08 DIAGNOSIS — N10 Acute tubulo-interstitial nephritis: Secondary | ICD-10-CM | POA: Diagnosis not present

## 2020-07-08 DIAGNOSIS — I10 Essential (primary) hypertension: Secondary | ICD-10-CM | POA: Diagnosis not present

## 2020-07-08 DIAGNOSIS — R0902 Hypoxemia: Secondary | ICD-10-CM | POA: Diagnosis not present

## 2020-07-08 DIAGNOSIS — F419 Anxiety disorder, unspecified: Secondary | ICD-10-CM | POA: Diagnosis not present

## 2020-07-11 ENCOUNTER — Other Ambulatory Visit: Payer: Self-pay | Admitting: Family Medicine

## 2020-07-12 ENCOUNTER — Ambulatory Visit: Payer: Medicare Other

## 2020-07-13 DIAGNOSIS — I509 Heart failure, unspecified: Secondary | ICD-10-CM | POA: Diagnosis not present

## 2020-07-13 DIAGNOSIS — Z9181 History of falling: Secondary | ICD-10-CM | POA: Diagnosis not present

## 2020-07-13 DIAGNOSIS — M1990 Unspecified osteoarthritis, unspecified site: Secondary | ICD-10-CM | POA: Diagnosis not present

## 2020-07-13 DIAGNOSIS — Z96653 Presence of artificial knee joint, bilateral: Secondary | ICD-10-CM | POA: Diagnosis not present

## 2020-07-13 DIAGNOSIS — E7800 Pure hypercholesterolemia, unspecified: Secondary | ICD-10-CM | POA: Diagnosis not present

## 2020-07-13 DIAGNOSIS — I110 Hypertensive heart disease with heart failure: Secondary | ICD-10-CM | POA: Diagnosis not present

## 2020-07-13 DIAGNOSIS — F419 Anxiety disorder, unspecified: Secondary | ICD-10-CM | POA: Diagnosis not present

## 2020-07-13 DIAGNOSIS — Z792 Long term (current) use of antibiotics: Secondary | ICD-10-CM | POA: Diagnosis not present

## 2020-07-13 DIAGNOSIS — N12 Tubulo-interstitial nephritis, not specified as acute or chronic: Secondary | ICD-10-CM | POA: Diagnosis not present

## 2020-07-13 DIAGNOSIS — Z7982 Long term (current) use of aspirin: Secondary | ICD-10-CM | POA: Diagnosis not present

## 2020-07-14 ENCOUNTER — Telehealth: Payer: Self-pay

## 2020-07-14 DIAGNOSIS — I509 Heart failure, unspecified: Secondary | ICD-10-CM | POA: Diagnosis not present

## 2020-07-14 DIAGNOSIS — N12 Tubulo-interstitial nephritis, not specified as acute or chronic: Secondary | ICD-10-CM | POA: Diagnosis not present

## 2020-07-14 DIAGNOSIS — I110 Hypertensive heart disease with heart failure: Secondary | ICD-10-CM | POA: Diagnosis not present

## 2020-07-14 DIAGNOSIS — M1990 Unspecified osteoarthritis, unspecified site: Secondary | ICD-10-CM | POA: Diagnosis not present

## 2020-07-14 DIAGNOSIS — F419 Anxiety disorder, unspecified: Secondary | ICD-10-CM | POA: Diagnosis not present

## 2020-07-14 DIAGNOSIS — E7800 Pure hypercholesterolemia, unspecified: Secondary | ICD-10-CM | POA: Diagnosis not present

## 2020-07-14 NOTE — Telephone Encounter (Signed)
Routing to provider for verbal orders 

## 2020-07-14 NOTE — Telephone Encounter (Signed)
Copied from [place] [id number]. Topic: General - Other >> Jul 14, 2020  2:16 PM Rainey Pines A wrote: [facility] called for  Verbal orders OT 1w1, then 2w2 then 1w1 Best contact [phone]

## 2020-07-14 NOTE — Telephone Encounter (Signed)
Called and gave the ok for verbal order per Jolene.

## 2020-07-14 NOTE — Telephone Encounter (Signed)
I am okay with verbal orders being provided.  Thank you.

## 2020-07-15 ENCOUNTER — Ambulatory Visit: Payer: Medicare Other

## 2020-07-15 DIAGNOSIS — E7800 Pure hypercholesterolemia, unspecified: Secondary | ICD-10-CM | POA: Diagnosis not present

## 2020-07-15 DIAGNOSIS — I110 Hypertensive heart disease with heart failure: Secondary | ICD-10-CM | POA: Diagnosis not present

## 2020-07-15 DIAGNOSIS — N12 Tubulo-interstitial nephritis, not specified as acute or chronic: Secondary | ICD-10-CM | POA: Diagnosis not present

## 2020-07-15 DIAGNOSIS — F419 Anxiety disorder, unspecified: Secondary | ICD-10-CM | POA: Diagnosis not present

## 2020-07-15 DIAGNOSIS — M1990 Unspecified osteoarthritis, unspecified site: Secondary | ICD-10-CM | POA: Diagnosis not present

## 2020-07-15 DIAGNOSIS — I509 Heart failure, unspecified: Secondary | ICD-10-CM | POA: Diagnosis not present

## 2020-07-16 ENCOUNTER — Ambulatory Visit (INDEPENDENT_AMBULATORY_CARE_PROVIDER_SITE_OTHER): Payer: Medicare Other | Admitting: Urology

## 2020-07-16 ENCOUNTER — Other Ambulatory Visit: Payer: Self-pay

## 2020-07-16 ENCOUNTER — Encounter: Payer: Self-pay | Admitting: Urology

## 2020-07-16 VITALS — BP 112/79 | HR 71 | Ht 61.0 in | Wt 266.0 lb

## 2020-07-16 DIAGNOSIS — N1330 Unspecified hydronephrosis: Secondary | ICD-10-CM | POA: Diagnosis not present

## 2020-07-16 DIAGNOSIS — N390 Urinary tract infection, site not specified: Secondary | ICD-10-CM

## 2020-07-16 DIAGNOSIS — N200 Calculus of kidney: Secondary | ICD-10-CM | POA: Diagnosis not present

## 2020-07-16 MED ORDER — D-MANNOSE 500 MG PO CAPS: ORAL_CAPSULE | ORAL | 0 refills | Status: AC

## 2020-07-16 NOTE — Progress Notes (Signed)
07/16/2020 10:25 AM   [name] 1946/02/25 [id number]  Referring provider: Juluis Pitch, MD [phone] [place],  [place] [id number]  Chief Complaint  Patient presents with  . Nephrolithiasis    HPI: Tracy Espinoza is a 74 y.o. female referred by Dr. Lovie Macadamia for hydronephrosis and nephrolithiasis.   Hospitalized [facility] 06/17/2020 for right pyelonephritis  Urine culture was positive for E. Coli  CT abdomen/pelvis without contrast performed which showed bilateral renal cysts, multiple nonobstructing right renal calculi and mild left hydronephrosis/hydroureter without evidence of obstruction.  A small calculus measuring 2 mm was seen within the bladder.  Denies flank or abdominal pain  History recurrent UTI on cranberry  Past urologic history remarkable for a left ovarian cystectomy with intraoperative left ureteral injury and subsequent repair by Dr. Madelin Headings ~40 years ago   PMH: Past Medical History:  Diagnosis Date  . Anxiety   . Arthritis   . Basal cell carcinoma 2019   L lateral sup clavicle   . Cancer ([place])    BASAL CELL SKIN CANCERS REMOVED  . Depression   . Edema    FEET/LEGS  . GERD (gastroesophageal reflux disease)   . Heart murmur   . History of hiatal hernia   . Hypertension   . Lymphedema   . MRSA (methicillin resistant staph aureus) culture positive    H/O    Surgical History: Past Surgical History:  Procedure Laterality Date  . ABDOMINAL HYSTERECTOMY    . APPENDECTOMY    . BACK SURGERY    . CATARACT EXTRACTION W/PHACO Left 03/28/2018   Procedure: CATARACT EXTRACTION PHACO AND INTRAOCULAR LENS PLACEMENT (IOC);  Surgeon: Eulogio Bear, MD;  Location: [facility] ORS;  Service: Ophthalmology;  Laterality: Left;  [place] 00:28.2 AP% 7.9 CDE 2.21 Fluid pack lot # [id number]  . CATARACT EXTRACTION W/PHACO Right 05/16/2018   Procedure: CATARACT EXTRACTION PHACO AND INTRAOCULAR LENS PLACEMENT (IOC);  Surgeon: Eulogio Bear, MD;  Location: [facility] ORS;   Service: Ophthalmology;  Laterality: Right;  [place] 00:26.2 AP% 7.6 CDE 2.01 Fluid pack lot # [id number]  . CHOLECYSTECTOMY    . COLONOSCOPY    . FRACTURE SURGERY     BONE ON SIDE OF RIGHT FOOT  . HERNIA REPAIR     UMBILICAL X 2  . JOINT REPLACEMENT Bilateral    knees  . KIDNEY SURGERY    . LUMBAR WOUND DEBRIDEMENT N/A 08/03/2016   Procedure: IRRIGATION AND DEBRIDEMENT LUMBAR WOUND;  Surgeon: Eustace Moore, MD;  Location: [place];  Service: Neurosurgery;  Laterality: N/A;  . OVARIAN CYST SURGERY    . REPAIR OF LEFT URETER Left 40 YRS AGO  . ROTATOR CUFF REPAIR  2014  . TONSILLECTOMY    . 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 If you have any questions, ask your nurse or doctor.        Acetaminophen 8 Hour 650 MG CR tablet Generic drug: acetaminophen Take 650 mg by mouth every 8 (eight) hours as needed for pain.   aspirin  EC 81 MG tablet Take 81 mg by mouth daily.   Calcium 600+D 600-800 MG-UNIT Tabs Generic drug: Calcium Carb-Cholecalciferol Take 1 tablet by mouth daily.   Cranberry 425 MG Caps Take 425 mg by mouth daily.   feeding supplement Liqd Take 237 mLs by mouth 3 (three) times daily between meals.   gabapentin 400 MG capsule Commonly known as: NEURONTIN TAKE UP TO 4 CAPSULES BY  MOUTH DAILY AS NEEDED What changed:   how much to take  how to take this  when to take this  reasons to take this   lactobacillus acidophilus Tabs tablet Take 2 tablets by mouth 3 (three) times daily.   LORazepam 0.5 MG tablet Commonly known as: ATIVAN TAKE 1 TABLET BY MOUTH  DAILY .  TAKE ONLY FOR BAD  DAYS. What changed: See the new instructions.   losartan 25 MG tablet Commonly known as: COZAAR Take 1 tablet (25 mg total) by mouth daily.   losartan 100 MG tablet Commonly known as: COZAAR TAKE 1 TABLET BY MOUTH  DAILY   melatonin 5 MG Tabs Take 1 tablet (5 mg total) by mouth at bedtime.   Metamucil 28 % packet Generic drug: psyllium Take 1 packet by mouth daily.   metoprolol succinate 50 MG 24 hr tablet Commonly known as: TOPROL-XL Take 1 tablet (50 mg total) by mouth daily.   multivitamin tablet Take 1 tablet by mouth daily.   nystatin cream Commonly known as: MYCOSTATIN Apply 1 application topically 2 (two) times daily.   nystatin powder Commonly known as: MYCOSTATIN/NYSTOP Apply 1 application topically 2 (two) times daily.   OMEGA-3 FISH OIL PO Take 2 capsules by mouth daily.   phenazopyridine 100 MG tablet Commonly known as: PYRIDIUM Take 100 mg by mouth 3 (three) times daily as needed.   sertraline 50 MG tablet Commonly known as: ZOLOFT TAKE 1 TABLET BY MOUTH EVERY DAY   traMADol 50 MG tablet Commonly known as: ULTRAM Take 1 tablet (50 mg total) by mouth 2 (two) times daily as needed.   traZODone 100 MG tablet Commonly known as: DESYREL TAKE 1 TABLET BY MOUTH AT  BEDTIME   vitamin C 1000 MG tablet Take 1,000 mg by mouth daily.       Allergies:  Allergies  Allergen Reactions  . Tape Itching, Dermatitis and Rash  . 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 Band-Aid Plus Antibiotic [Bacitracin-Polymyxin B] Other (See Comments)    Unknown  . Iodinated Diagnostic Agents Itching  . Morphine Sulfate Itching  . Vancomycin Rash    Family History: Family History  Problem Relation Age of Onset  . Hypertension Mother   . Stroke Mother   . Alzheimer's disease Mother   . Cancer Father        brain  . Hypertension Father   . Stroke Father     Social History:  reports that she has  never smoked. She has never used smokeless tobacco. She reports current alcohol use of about 5.0 standard drinks of alcohol per week. She reports that she does not use drugs.   Physical Exam: BP 112/79   Pulse 71   Ht 5\' 1"  (1.549 m)   Wt 266 lb (120.7 kg)   BMI 50.26 kg/m   Constitutional:  Alert and oriented, No acute distress. HEENT: [place] AT, moist mucus membranes.  Trachea midline, no masses. Cardiovascular: No clubbing, cyanosis, or edema. Respiratory: Normal respiratory effort, no increased work of breathing.  Skin: No rashes, bruises or suspicious lesions. Neurologic: Grossly intact, no focal deficits, moving all 4 extremities. Psychiatric: Normal mood and affect.  Pertinent imaging: CT scan from 06/2020 was reviewed and there are nonobstructing right renal calculi present.  There is mild left hydronephrosis/hydroureter all the way to the superior portion of the bladder and it appears she may have had a psoas hitch performed.  This is not significantly changed from a CT of 2017.   Assessment & Plan:    1.  Right nephrolithiasis  Nonobstructing right renal calculi  Would recommend surveillance  Follow-up 6 months with [facility]  Return earlier for development of right flank pain  2.  Left hydronephrosis  Mild left hydronephrosis most likely secondary to previous psoas hitch and reflux.  No significant change from CT 2017 and unlikely obstructive  3.  Recurrent UTI  On cranberry  Recommend starting D-mannose.  Although OTC she asked if I would send an Rx to [facility], [place] [address] [place], [place] [address] [phone]

## 2020-07-19 ENCOUNTER — Ambulatory Visit: Payer: Medicare Other

## 2020-07-19 ENCOUNTER — Encounter: Payer: Self-pay | Admitting: Urology

## 2020-07-20 ENCOUNTER — Telehealth: Payer: Self-pay

## 2020-07-20 ENCOUNTER — Other Ambulatory Visit: Payer: Self-pay

## 2020-07-20 ENCOUNTER — Encounter: Payer: Self-pay | Admitting: Nurse Practitioner

## 2020-07-20 ENCOUNTER — Ambulatory Visit (INDEPENDENT_AMBULATORY_CARE_PROVIDER_SITE_OTHER): Payer: Medicare Other | Admitting: Nurse Practitioner

## 2020-07-20 DIAGNOSIS — E7800 Pure hypercholesterolemia, unspecified: Secondary | ICD-10-CM | POA: Diagnosis not present

## 2020-07-20 DIAGNOSIS — F419 Anxiety disorder, unspecified: Secondary | ICD-10-CM | POA: Diagnosis not present

## 2020-07-20 DIAGNOSIS — I890 Lymphedema, not elsewhere classified: Secondary | ICD-10-CM

## 2020-07-20 DIAGNOSIS — I10 Essential (primary) hypertension: Secondary | ICD-10-CM

## 2020-07-20 MED ORDER — LOSARTAN POTASSIUM 100 MG PO TABS
100.0000 mg | ORAL_TABLET | Freq: Every day | ORAL | 1 refills | Status: DC
Start: 2020-07-20 — End: 2021-02-11

## 2020-07-20 MED ORDER — TRAZODONE HCL 100 MG PO TABS
100.0000 mg | ORAL_TABLET | Freq: Every day | ORAL | 1 refills | Status: DC
Start: 2020-07-20 — End: 2020-08-19

## 2020-07-20 MED ORDER — LORAZEPAM 0.5 MG PO TABS: ORAL_TABLET | ORAL | 0 refills | Status: AC

## 2020-07-20 MED ORDER — GABAPENTIN 400 MG PO CAPS
ORAL_CAPSULE | ORAL | 1 refills | Status: DC
Start: 2020-07-20 — End: 2021-01-02

## 2020-07-20 NOTE — Telephone Encounter (Signed)
Routing to provider as an Pharmacist, hospital. Previous RL patient.

## 2020-07-20 NOTE — Assessment & Plan Note (Signed)
Chronic, ongoing.  Denies SI/HI.  Is aware of risks of long term benzo use, wishes to continue at this time.  Refills sent in.  Continue current medication regimen and adjust as needed.  PDMP reviewed.  Is establishing care in [place] over next months, refills sent to last until new provider established.

## 2020-07-20 NOTE — Telephone Encounter (Signed)
Noted, patient has visit today in office.

## 2020-07-20 NOTE — Assessment & Plan Note (Signed)
Ongoing, no current statin, refuses this.  Continue focus on diet and regular activity.  Plan for lipid panel in future.

## 2020-07-20 NOTE — Progress Notes (Signed)
BP 133/76 (BP Location: Left Arm, Patient Position: Sitting, Cuff Size: Large)   Pulse 67   Temp 98.2 F (36.8 C) (Oral)   Ht 5' (1.524 m)   Wt 268 lb (121.6 kg)   SpO2 96%   BMI 52.34 kg/m    Subjective:    Patient ID: Tracy Espinoza, female    DOB: 1946-04-27, 74 y.o.   MRN: [id number]  HPI: Tracy Espinoza is a 74 y.o. female  Chief Complaint  Patient presents with  . Follow-up   HYPERTENSION / HYPERLIPIDEMIA Continues on Losartan and Metoprolol + ASA -- she is moving to [place] in upcoming months to live with her son in an in-law suite.    She has history of fall in September due to pyelonephritis and PNA.  She denies any further falls, but does endorse tiring easily at times. Satisfied with current treatment? yes Duration of hypertension: chronic BP monitoring frequency: not checking BP range:  BP medication side effects: no Duration of hyperlipidemia: chronic Cholesterol medication side effects: no Cholesterol supplements: none Medication compliance: good compliance Aspirin: yes Recent stressors: no Recurrent headaches: no Visual changes: no Palpitations: no Dyspnea: no Chest pain: no Lower extremity edema: no Dizzy/lightheaded: no   ANXIETY/STRESS Continues on Sertraline, Trazodone, and Ativan.  Pt is aware of risks of psychoactive medication use to include increased sedation, respiratory suppression, falls, dependence and cardiovascular events.  Pt would like to continue treatment as benefit determined to outweigh risk.  Last Ativan fill was on 07/05/2020 -- for 14 days supply only.  She needs refills on this. Duration:stable Anxious mood: no  Excessive worrying: no Irritability: no  Sweating: no Nausea: no Palpitations:no Hyperventilation: no Panic attacks: no Agoraphobia: no  Obscessions/compulsions: no Depressed mood: no Depression screen [facility] 2/9 04/23/2020 03/17/2020 11/05/2019 03/26/2019 10/24/2018  Decreased Interest 0 0 1 0 0  Down, Depressed,  Hopeless 0 0 2 3 0  PHQ - 2 Score 0 0 3 3 0  Altered sleeping - 1 0 3 3  Tired, decreased energy - 1 2 0 1  Change in appetite - 0 1 0 0  Feeling bad or failure about yourself  - 0 0 1 0  Trouble concentrating - 0 0 0 0  Moving slowly or fidgety/restless - 0 0 0 0  Suicidal thoughts - 0 0 0 0  PHQ-9 Score - 2 6 7 4   Difficult doing work/chores - - - Not difficult at all -  Some recent data might be hidden   Anhedonia: no Weight changes: no Insomnia: yes hard to fall asleep  Hypersomnia: no Fatigue/loss of energy: no Feelings of worthlessness: no Feelings of guilt: no Impaired concentration/indecisiveness: no Suicidal ideations: no  Crying spells: no Recent Stressors/Life Changes: no   Relationship problems: no   Family stress: no     Financial stress: no    Job stress: no    Recent death/loss: no  Relevant past medical, surgical, family and social history reviewed and updated as indicated. Interim medical history since our last visit reviewed. Allergies and medications reviewed and updated.  Review of Systems  Constitutional: Negative for activity change, appetite change, diaphoresis, fatigue and fever.  Respiratory: Negative for cough, chest tightness and shortness of breath.   Cardiovascular: Negative for chest pain, palpitations and leg swelling.  Gastrointestinal: Negative.   Neurological: Negative.   Psychiatric/Behavioral: Negative.     Per HPI unless specifically indicated above     Objective:    BP 133/76 (BP  Location: Left Arm, Patient Position: Sitting, Cuff Size: Large)   Pulse 67   Temp 98.2 F (36.8 C) (Oral)   Ht 5' (1.524 m)   Wt 268 lb (121.6 kg)   SpO2 96%   BMI 52.34 kg/m   Wt Readings from Last 3 Encounters:  07/20/20 268 lb (121.6 kg)  07/16/20 266 lb (120.7 kg)  06/22/20 271 lb 2.7 oz (123 kg)    Physical Exam Vitals and nursing note reviewed.  Constitutional:      General: She is awake. She is not in acute distress.    Appearance:  She is well-developed and well-groomed. She is morbidly obese. She is not ill-appearing or toxic-appearing.  HENT:     Head: Normocephalic.     Right Ear: Hearing normal.     Left Ear: Hearing normal.  Eyes:     General: Lids are normal.        Right eye: No discharge.        Left eye: No discharge.     Conjunctiva/sclera: Conjunctivae normal.     Pupils: Pupils are equal, round, and reactive to light.  Neck:     Thyroid: No thyromegaly.     Vascular: No carotid bruit.  Cardiovascular:     Rate and Rhythm: Normal rate and regular rhythm.     Heart sounds: Normal heart sounds. No murmur heard.  No gallop.      Comments: Compression hose on. Pulmonary:     Effort: Pulmonary effort is normal. No accessory muscle usage or respiratory distress.     Breath sounds: Normal breath sounds.  Abdominal:     General: Bowel sounds are normal.     Palpations: Abdomen is soft.  Musculoskeletal:     Cervical back: Normal range of motion and neck supple.     Right lower leg: 1+ Pitting Edema present.     Left lower leg: 1+ Pitting Edema present.  Skin:    General: Skin is warm and dry.  Neurological:     Mental Status: She is alert and oriented to person, place, and time.     Comments: Utilizes walker for mobility  Psychiatric:        Attention and Perception: Attention normal.        Mood and Affect: Mood normal.        Speech: Speech normal.        Behavior: Behavior normal. Behavior is cooperative.        Thought Content: Thought content normal.     Results for orders placed or performed during the hospital encounter of 06/17/20  Urine Culture   Specimen: Urine, Random  Result Value Ref Range   Specimen Description      URINE, RANDOM Performed at [facility], [address] [place], [place] [address]    Special Requests      NONE Performed at [facility], [address] [place], [place] [address]    Culture >=100,000 COLONIES/mL ESCHERICHIA COLI (A)     Report Status 06/19/2020 FINAL    Organism ID, Bacteria ESCHERICHIA COLI (A)       Susceptibility   Escherichia coli - MIC*    AMPICILLIN 4 SENSITIVE Sensitive     CEFAZOLIN <=4 SENSITIVE Sensitive     CEFTRIAXONE <=0.25 SENSITIVE Sensitive     CIPROFLOXACIN <=0.25 SENSITIVE Sensitive     GENTAMICIN <=1 SENSITIVE Sensitive     IMIPENEM <=0.25 SENSITIVE Sensitive     NITROFURANTOIN <=16 SENSITIVE Sensitive  TRIMETH/SULFA <=20 SENSITIVE Sensitive     AMPICILLIN/SULBACTAM 4 SENSITIVE Sensitive     PIP/TAZO <=4 SENSITIVE Sensitive     * >=100,000 COLONIES/mL ESCHERICHIA COLI  SARS Coronavirus 2 by RT PCR (hospital order, performed in [place] hospital lab) Nasopharyngeal Nasopharyngeal Swab   Specimen: Nasopharyngeal Swab  Result Value Ref Range   SARS Coronavirus 2 NEGATIVE NEGATIVE  CULTURE, BLOOD (ROUTINE X 2) w Reflex to ID Panel   Specimen: BLOOD  Result Value Ref Range   Specimen Description BLOOD RIGHT AC    Special Requests      BOTTLES DRAWN AEROBIC AND ANAEROBIC Blood Culture adequate volume   Culture      NO GROWTH 5 DAYS Performed at [facility], [address] [place], [place] [address]    Report Status 06/22/2020 FINAL   Culture, blood (Routine X 2) w Reflex to ID Panel   Specimen: BLOOD  Result Value Ref Range   Specimen Description BLOOD BLOOD RIGHT WRIST    Special Requests      BOTTLES DRAWN AEROBIC ONLY Blood Culture results may not be optimal due to an inadequate volume of blood received in culture bottles   Culture      NO GROWTH 5 DAYS Performed at [facility], [address] [place], [place] [address]    Report Status 06/23/2020 FINAL   Comprehensive metabolic panel  Result Value Ref Range   Sodium 139 135 - 145 mmol/L   Potassium 3.2 (L) 3.5 - 5.1 mmol/L   Chloride 96 (L) 98 - 111 mmol/L   CO2 32 22 - 32 mmol/L   Glucose, Bld 139 (H) 70 - 99 mg/dL   BUN 41 (H) 8 - 23 mg/dL   Creatinine, Ser 1.29 (H) 0.44 - 1.00 mg/dL     Calcium 9.3 8.9 - 10.3 mg/dL   Total Protein 7.1 6.5 - 8.1 g/dL   Albumin 3.8 3.5 - 5.0 g/dL   AST 25 15 - 41 U/L   ALT 13 0 - 44 U/L   Alkaline Phosphatase 34 (L) 38 - 126 U/L   Total Bilirubin 1.8 (H) 0.3 - 1.2 mg/dL   GFR calc non Af Amer 40 (L) >60 mL/min   GFR calc Af Amer 47 (L) >60 mL/min   Anion gap 11 5 - 15  CBC  Result Value Ref Range   WBC 18.6 (H) 4.0 - 10.5 K/uL   RBC 4.35 3.87 - 5.11 MIL/uL   Hemoglobin 13.8 12.0 - 15.0 g/dL   HCT 42.3 36 - 46 %   MCV 97.2 80.0 - 100.0 fL   MCH 31.7 26.0 - 34.0 pg   MCHC 32.6 30.0 - 36.0 g/dL   RDW 13.7 11.5 - 15.5 %   Platelets 152 150 - 400 K/uL   nRBC 0.0 0.0 - 0.2 %  Urinalysis, Routine w reflex microscopic Urine, Clean Catch  Result Value Ref Range   Color, Urine AMBER (A) YELLOW   APPearance CLOUDY (A) CLEAR   Specific Gravity, Urine 1.014 1.005 - 1.030   pH 5.0 5.0 - 8.0   Glucose, UA NEGATIVE NEGATIVE mg/dL   Hgb urine dipstick MODERATE (A) NEGATIVE   Bilirubin Urine NEGATIVE NEGATIVE   Ketones, ur NEGATIVE NEGATIVE mg/dL   Protein, ur >=300 (A) NEGATIVE mg/dL   Nitrite POSITIVE (A) NEGATIVE   Leukocytes,Ua MODERATE (A) NEGATIVE   RBC / HPF >50 (H) 0 - 5 RBC/hpf   WBC, UA >50 (H) 0 - 5 WBC/hpf  Bacteria, UA MANY (A) NONE SEEN   Squamous Epithelial / LPF NONE SEEN 0 - 5   WBC Clumps PRESENT    Mucus PRESENT    Hyaline Casts, UA PRESENT    Amorphous Crystal PRESENT   Magnesium  Result Value Ref Range   Magnesium 1.4 (L) 1.7 - 2.4 mg/dL  Magnesium  Result Value Ref Range   Magnesium 2.0 1.7 - 2.4 mg/dL  CBC  Result Value Ref Range   WBC 13.0 (H) 4.0 - 10.5 K/uL   RBC 3.81 (L) 3.87 - 5.11 MIL/uL   Hemoglobin 12.1 12.0 - 15.0 g/dL   HCT 38.1 36 - 46 %   MCV 100.0 80.0 - 100.0 fL   MCH 31.8 26.0 - 34.0 pg   MCHC 31.8 30.0 - 36.0 g/dL   RDW 14.0 11.5 - 15.5 %   Platelets 127 (L) 150 - 400 K/uL   nRBC 0.0 0.0 - 0.2 %  Basic metabolic panel  Result Value Ref Range   Sodium 141 135 - 145 mmol/L    Potassium 4.0 3.5 - 5.1 mmol/L   Chloride 102 98 - 111 mmol/L   CO2 32 22 - 32 mmol/L   Glucose, Bld 121 (H) 70 - 99 mg/dL   BUN 40 (H) 8 - 23 mg/dL   Creatinine, Ser 1.33 (H) 0.44 - 1.00 mg/dL   Calcium 8.3 (L) 8.9 - 10.3 mg/dL   GFR calc non Af Amer 39 (L) >60 mL/min   GFR calc Af Amer 45 (L) >60 mL/min   Anion gap 7 5 - 15  CBC  Result Value Ref Range   WBC 11.1 (H) 4.0 - 10.5 K/uL   RBC 4.00 3.87 - 5.11 MIL/uL   Hemoglobin 12.5 12.0 - 15.0 g/dL   HCT 41.8 36 - 46 %   MCV 104.5 (H) 80.0 - 100.0 fL   MCH 31.3 26.0 - 34.0 pg   MCHC 29.9 (L) 30.0 - 36.0 g/dL   RDW 13.6 11.5 - 15.5 %   Platelets 136 (L) 150 - 400 K/uL   nRBC 0.0 0.0 - 0.2 %  Basic metabolic panel  Result Value Ref Range   Sodium 137 135 - 145 mmol/L   Potassium 4.4 3.5 - 5.1 mmol/L   Chloride 99 98 - 111 mmol/L   CO2 28 22 - 32 mmol/L   Glucose, Bld 122 (H) 70 - 99 mg/dL   BUN 32 (H) 8 - 23 mg/dL   Creatinine, Ser 0.96 0.44 - 1.00 mg/dL   Calcium 8.6 (L) 8.9 - 10.3 mg/dL   GFR calc non Af Amer 58 (L) >60 mL/min   GFR calc Af Amer >60 >60 mL/min   Anion gap 10 5 - 15  Brain natriuretic peptide  Result Value Ref Range   B Natriuretic Peptide 295.7 (H) 0.0 - 100.0 pg/mL  Potassium  Result Value Ref Range   Potassium 3.7 3.5 - 5.1 mmol/L  CBC  Result Value Ref Range   WBC 7.6 4.0 - 10.5 K/uL   RBC 3.37 (L) 3.87 - 5.11 MIL/uL   Hemoglobin 10.8 (L) 12.0 - 15.0 g/dL   HCT 33.8 (L) 36 - 46 %   MCV 100.3 (H) 80.0 - 100.0 fL   MCH 32.0 26.0 - 34.0 pg   MCHC 32.0 30.0 - 36.0 g/dL   RDW 13.0 11.5 - 15.5 %   Platelets 113 (L) 150 - 400 K/uL   nRBC 0.0 0.0 - 0.2 %  CBC  Result Value  Ref Range   WBC 6.3 4.0 - 10.5 K/uL   RBC 3.61 (L) 3.87 - 5.11 MIL/uL   Hemoglobin 11.4 (L) 12.0 - 15.0 g/dL   HCT 36.7 36 - 46 %   MCV 101.7 (H) 80.0 - 100.0 fL   MCH 31.6 26.0 - 34.0 pg   MCHC 31.1 30.0 - 36.0 g/dL   RDW 13.0 11.5 - 15.5 %   Platelets PLATELET CLUMPS NOTED ON SMEAR, UNABLE TO ESTIMATE 150 - 400 K/uL    nRBC 0.0 0.0 - 0.2 %  Basic metabolic panel  Result Value Ref Range   Sodium 143 135 - 145 mmol/L   Potassium 3.7 3.5 - 5.1 mmol/L   Chloride 101 98 - 111 mmol/L   CO2 34 (H) 22 - 32 mmol/L   Glucose, Bld 96 70 - 99 mg/dL   BUN 19 8 - 23 mg/dL   Creatinine, Ser 0.72 0.44 - 1.00 mg/dL   Calcium 8.6 (L) 8.9 - 10.3 mg/dL   GFR calc non Af Amer >60 >60 mL/min   GFR calc Af Amer >60 >60 mL/min   Anion gap 8 5 - 15      Assessment & Plan:   Problem List Items Addressed This Visit      Cardiovascular and Mediastinum   Hypertension    Chronic, ongoing with BP at goal for age today.  Continue current medication regimen and adjust as needed.  Recommend she monitor BP at home daily and document + focus on DASH diet.  Labs today to include BMP, CBC, and TSH.  Is establishing care in [place] over next months, refills sent to last until new provider established.      Relevant Medications   losartan (COZAAR) 100 MG tablet   Other Relevant Orders   CBC with Differential/Platelet   Basic metabolic panel   TSH     Other   Hypercholesteremia    Ongoing, no current statin, refuses this.  Continue focus on diet and regular activity.  Plan for lipid panel in future.      Relevant Medications   losartan (COZAAR) 100 MG tablet   Morbid obesity (HCC) - Primary    BMI 52.34 with HLD, HTN.  Recommended eating smaller high protein, low fat meals more frequently and exercising 30 mins a day 5 times a week with a goal of 10-15lb weight loss in the next 3 months. Patient voiced their understanding and motivation to adhere to these recommendations.       Anxiety    Chronic, ongoing.  Denies SI/HI.  Is aware of risks of long term benzo use, wishes to continue at this time.  Refills sent in.  Continue current medication regimen and adjust as needed.  PDMP reviewed.  Is establishing care in [place] over next months, refills sent to last until new provider established.      Relevant Medications    traZODone (DESYREL) 100 MG tablet   LORazepam (ATIVAN) 0.5 MG tablet   Lymphedema    Ongoing.  Continue collaboration with vascular and use of compression hose daily.          Follow up plan: Return if symptoms worsen or fail to improve.

## 2020-07-20 NOTE — Assessment & Plan Note (Signed)
BMI 52.34 with HLD, HTN.  Recommended eating smaller high protein, low fat meals more frequently and exercising 30 mins a day 5 times a week with a goal of 10-15lb weight loss in the next 3 months. Patient voiced their understanding and motivation to adhere to these recommendations.

## 2020-07-20 NOTE — Telephone Encounter (Signed)
Copied from [place] [phone]. Topic: General - Other >> Jul 20, 2020  1:22 PM Rainey Pines A wrote: Tonya from [facility] doesn't want ot visits but does want to do the pt visits only.

## 2020-07-20 NOTE — Patient Instructions (Signed)

## 2020-07-20 NOTE — Assessment & Plan Note (Signed)
Ongoing.  Continue collaboration with vascular and use of compression hose daily.

## 2020-07-20 NOTE — Assessment & Plan Note (Signed)
Chronic, ongoing with BP at goal for age today.  Continue current medication regimen and adjust as needed.  Recommend she monitor BP at home daily and document + focus on DASH diet.  Labs today to include BMP, CBC, and TSH.  Is establishing care in [place] over next months, refills sent to last until new provider established.

## 2020-07-21 DIAGNOSIS — F419 Anxiety disorder, unspecified: Secondary | ICD-10-CM | POA: Diagnosis not present

## 2020-07-21 DIAGNOSIS — I509 Heart failure, unspecified: Secondary | ICD-10-CM | POA: Diagnosis not present

## 2020-07-21 DIAGNOSIS — N12 Tubulo-interstitial nephritis, not specified as acute or chronic: Secondary | ICD-10-CM | POA: Diagnosis not present

## 2020-07-21 DIAGNOSIS — M1990 Unspecified osteoarthritis, unspecified site: Secondary | ICD-10-CM | POA: Diagnosis not present

## 2020-07-21 DIAGNOSIS — E7800 Pure hypercholesterolemia, unspecified: Secondary | ICD-10-CM | POA: Diagnosis not present

## 2020-07-21 DIAGNOSIS — I110 Hypertensive heart disease with heart failure: Secondary | ICD-10-CM | POA: Diagnosis not present

## 2020-07-21 LAB — CBC WITH DIFFERENTIAL/PLATELET
Basophils Absolute: 0.1 10*3/uL (ref 0.0–0.2)
Basos: 1 %
EOS (ABSOLUTE): 0.1 10*3/uL (ref 0.0–0.4)
Eos: 1 %
Hematocrit: 40.2 % (ref 34.0–46.6)
Hemoglobin: 13.2 g/dL (ref 11.1–15.9)
Immature Grans (Abs): 0 10*3/uL (ref 0.0–0.1)
Immature Granulocytes: 0 %
Lymphocytes Absolute: 3.7 10*3/uL — ABNORMAL HIGH (ref 0.7–3.1)
Lymphs: 41 %
MCH: 31.5 pg (ref 26.6–33.0)
MCHC: 32.8 g/dL (ref 31.5–35.7)
MCV: 96 fL (ref 79–97)
Monocytes Absolute: 0.5 10*3/uL (ref 0.1–0.9)
Monocytes: 5 %
Neutrophils Absolute: 4.6 10*3/uL (ref 1.4–7.0)
Neutrophils: 52 %
Platelets: 148 10*3/uL — ABNORMAL LOW (ref 150–450)
RBC: 4.19 x10E6/uL (ref 3.77–5.28)
RDW: 12.9 % (ref 11.7–15.4)
WBC: 8.9 10*3/uL (ref 3.4–10.8)

## 2020-07-21 LAB — BASIC METABOLIC PANEL
BUN/Creatinine Ratio: 39 — ABNORMAL HIGH (ref 12–28)
BUN: 32 mg/dL — ABNORMAL HIGH (ref 8–27)
CO2: 26 mmol/L (ref 20–29)
Calcium: 10.1 mg/dL (ref 8.7–10.3)
Chloride: 100 mmol/L (ref 96–106)
Creatinine, Ser: 0.82 mg/dL (ref 0.57–1.00)
GFR calc Af Amer: 81 mL/min/{1.73_m2} (ref >59)
GFR calc non Af Amer: 70 mL/min/{1.73_m2} (ref >59)
Glucose: 126 mg/dL — ABNORMAL HIGH (ref 65–99)
Potassium: 4.1 mmol/L (ref 3.5–5.2)
Sodium: 142 mmol/L (ref 134–144)

## 2020-07-21 LAB — TSH: TSH: 1.82 u[IU]/mL (ref 0.450–4.500)

## 2020-07-21 NOTE — Progress Notes (Signed)
Good afternoon, please let Tracy Espinoza know her labs have returned and anemia has improved from her last labs one month ago.  Platelets are a little low, but improved from past labs.  Kidney function is good and thyroid level normal.  No medication changes needed at this time.  Have a great day!! Keep being awesome!!  Thank you for allowing me to participate in your care. Kindest regards, Zema Lizardo

## 2020-07-22 ENCOUNTER — Ambulatory Visit: Payer: Medicare Other

## 2020-07-26 ENCOUNTER — Ambulatory Visit: Payer: Medicare Other

## 2020-07-26 DIAGNOSIS — M1990 Unspecified osteoarthritis, unspecified site: Secondary | ICD-10-CM | POA: Diagnosis not present

## 2020-07-26 DIAGNOSIS — E7800 Pure hypercholesterolemia, unspecified: Secondary | ICD-10-CM | POA: Diagnosis not present

## 2020-07-26 DIAGNOSIS — I110 Hypertensive heart disease with heart failure: Secondary | ICD-10-CM | POA: Diagnosis not present

## 2020-07-26 DIAGNOSIS — N12 Tubulo-interstitial nephritis, not specified as acute or chronic: Secondary | ICD-10-CM | POA: Diagnosis not present

## 2020-07-26 DIAGNOSIS — F419 Anxiety disorder, unspecified: Secondary | ICD-10-CM | POA: Diagnosis not present

## 2020-07-26 DIAGNOSIS — I509 Heart failure, unspecified: Secondary | ICD-10-CM | POA: Diagnosis not present

## 2020-07-28 ENCOUNTER — Encounter: Payer: Self-pay | Admitting: Gastroenterology

## 2020-07-28 ENCOUNTER — Ambulatory Visit (INDEPENDENT_AMBULATORY_CARE_PROVIDER_SITE_OTHER): Payer: Medicare Other | Admitting: Gastroenterology

## 2020-07-28 ENCOUNTER — Other Ambulatory Visit: Payer: Self-pay

## 2020-07-28 VITALS — BP 131/71 | HR 77 | Temp 97.5°F | Ht 60.0 in | Wt 269.0 lb

## 2020-07-28 DIAGNOSIS — R159 Full incontinence of feces: Secondary | ICD-10-CM

## 2020-07-28 MED ORDER — LOPERAMIDE HCL 2 MG PO CAPS: 2.0000 mg | ORAL_CAPSULE | ORAL | 0 refills | Status: AC | PRN

## 2020-07-28 NOTE — Patient Instructions (Signed)
Please give [place] a call in case you have any questions or concerns.   Good luck with your move and new adventures!

## 2020-07-29 ENCOUNTER — Ambulatory Visit: Payer: Medicare Other

## 2020-07-29 NOTE — Progress Notes (Signed)
Vonda Antigua, MD [address]  [place]  [place], [place] [address]  Main: [phone]  Fax: [phone]   Primary Care Physician: Venita Lick, NP   Chief Complaint  Patient presents with  . Diarrhea    HPI: Tracy Espinoza is a 74 y.o. female here for follow-up of diarrhea attributed to pelvic floor dysfunction.  She did see pelvic floor physical therapy and pelvic floor PT did help.  She was recently admitted with UTI and was antibiotics.  She has not noticed any increase in diarrhea since being on antibiotics which she has now completed.  She does drink 1 can of diet [facility] daily.  No blood in stool.  No weight loss.  She is reporting having loose bowel movements about 2-3 times a day.  However, since she started taking Metamucil symptoms are much better and stool is more formed and she is having less watery or loose bowel movements.  She is not using Imodium  2015 colonoscopy with diverticulosis  Current Outpatient Medications  Medication Sig Dispense Refill  . acetaminophen (ACETAMINOPHEN 8 HOUR) 650 MG CR tablet Take 650 mg by mouth every 8 (eight) hours as needed for pain.     . Ascorbic Acid (VITAMIN C) 1000 MG tablet Take 1,000 mg by mouth daily.    Marland Kitchen aspirin EC 81 MG tablet Take 81 mg by mouth daily.    . Calcium Carb-Cholecalciferol (CALCIUM 600+D) 600-800 MG-UNIT TABS Take 1 tablet by mouth daily.     . Cranberry 425 MG CAPS Take 425 mg by mouth daily.     . D-Mannose 500 MG CAPS 2 capsules twice daily 120 capsule 0  . feeding supplement, ENSURE ENLIVE, (ENSURE ENLIVE) LIQD Take 237 mLs by mouth 3 (three) times daily between meals. 237 mL 12  . gabapentin (NEURONTIN) 400 MG capsule TAKE UP TO 4 CAPSULES BY  MOUTH DAILY AS NEEDED 360 capsule 1  . hydrochlorothiazide (HYDRODIURIL) 25 MG tablet Take 25 mg by mouth daily.    Marland Kitchen lactobacillus acidophilus (BACID) TABS tablet Take 2 tablets by mouth 3 (three) times daily.    Marland Kitchen LORazepam (ATIVAN) 0.5 MG  tablet TAKE 1 TABLET AS NEEDED BY MOUTH DAILY, TAKE ONLY FOR SEVERE ANXIETY. 90 tablet 0  . losartan (COZAAR) 100 MG tablet Take 1 tablet (100 mg total) by mouth daily. 90 tablet 1  . melatonin 5 MG TABS Take 1 tablet (5 mg total) by mouth at bedtime.  0  . metoprolol succinate (TOPROL-XL) 50 MG 24 hr tablet Take 1 tablet (50 mg total) by mouth daily. 90 tablet 4  . Multiple Vitamin (MULTIVITAMIN) tablet Take 1 tablet by mouth daily.    Marland Kitchen nystatin (MYCOSTATIN/NYSTOP) powder Apply 1 application topically 2 (two) times daily. 60 g 1  . nystatin cream (MYCOSTATIN) Apply 1 application topically 2 (two) times daily. 60 g 1  . Omega-3 Fatty Acids (OMEGA-3 FISH OIL PO) Take 2 capsules by mouth daily.     . phenazopyridine (PYRIDIUM) 100 MG tablet Take 100 mg by mouth 3 (three) times daily as needed.    . psyllium (METAMUCIL) 28 % packet Take 1 packet by mouth daily. 30 packet 2  . sertraline (ZOLOFT) 50 MG tablet TAKE 1 TABLET BY MOUTH EVERY DAY (Patient taking differently: Take 50 mg by mouth daily. ) 90 tablet 1  . traZODone (DESYREL) 100 MG tablet Take 1 tablet (100 mg total) by mouth at bedtime. 90 tablet 1  . Zinc 100  MG TABS Take by mouth.    . loperamide (IMODIUM) 2 MG capsule Take 1 capsule (2 mg total) by mouth every three (3) days as needed for diarrhea or loose stools. 90 capsule 0   No current facility-administered medications for this visit.    Allergies as of 07/28/2020 - Review Complete 07/28/2020  Allergen Reaction Noted  . Tape Itching, Dermatitis, and Rash 11/10/2015  . Daptomycin Other (See Comments) 08/21/2016  . Band-aid plus antibiotic [bacitracin-polymyxin b] Other (See Comments) 08/06/2015  . Iodinated diagnostic agents Itching 10/08/2015  . Morphine sulfate Itching 01/22/2015  . Vancomycin Rash 08/03/2016    ROS:  General: Negative for anorexia, weight loss, fever, chills, fatigue, weakness. ENT: Negative for hoarseness, difficulty swallowing , nasal congestion. CV:  Negative for chest pain, angina, palpitations, dyspnea on exertion, peripheral edema.  Respiratory: Negative for dyspnea at rest, dyspnea on exertion, cough, sputum, wheezing.  GI: See history of present illness. GU:  Negative for dysuria, hematuria, urinary incontinence, urinary frequency, nocturnal urination.  Endo: Negative for unusual weight change.    Physical Examination:   BP 131/71   Pulse 77   Temp (!) 97.5 F (36.4 C) (Oral)   Ht 5' (1.524 m)   Wt 269 lb (122 kg)   BMI 52.54 kg/m   General: Well-nourished, well-developed in no acute distress.  Eyes: No icterus. Conjunctivae pink. Mouth: Oropharyngeal mucosa moist and pink , no lesions erythema or exudate. Neck: Supple, Trachea midline Abdomen: Bowel sounds are normal, nontender, nondistended, no hepatosplenomegaly or masses, no abdominal bruits or hernia , no rebound or guarding.   Extremities: No lower extremity edema. No clubbing or deformities. Neuro: Alert and oriented x 3.  Grossly intact. Skin: Warm and dry, no jaundice.   Psych: Alert and cooperative, normal mood and affect.   Labs: CMP     Component Value Date/Time   NA 142 07/20/2020 1619   NA 138 10/23/2012 1520   K 4.1 07/20/2020 1619   K 3.9 10/23/2012 1520   CL 100 07/20/2020 1619   CL 101 10/23/2012 1520   CO2 26 07/20/2020 1619   CO2 32 10/23/2012 1520   GLUCOSE 126 (H) 07/20/2020 1619   GLUCOSE 96 06/21/2020 0730   GLUCOSE 101 (H) 10/23/2012 1520   BUN 32 (H) 07/20/2020 1619   BUN 28 (H) 10/23/2012 1520   CREATININE 0.82 07/20/2020 1619   CREATININE 0.81 10/23/2012 1520   CALCIUM 10.1 07/20/2020 1619   CALCIUM 9.8 10/23/2012 1520   PROT 7.1 06/17/2020 1326   PROT 6.6 12/25/2019 1450   ALBUMIN 3.8 06/17/2020 1326   ALBUMIN 4.5 12/25/2019 1450   AST 25 06/17/2020 1326   AST 28 10/24/2018 1136   ALT 13 06/17/2020 1326   ALT 21 10/24/2018 1136   ALKPHOS 34 (L) 06/17/2020 1326   BILITOT 1.8 (H) 06/17/2020 1326   BILITOT 0.6 12/25/2019  1450   GFRNONAA 70 07/20/2020 1619   GFRNONAA >60 10/23/2012 1520   GFRAA 81 07/20/2020 1619   GFRAA >60 10/23/2012 1520   Lab Results  Component Value Date   WBC 8.9 07/20/2020   HGB 13.2 07/20/2020   HCT 40.2 07/20/2020   MCV 96 07/20/2020   PLT 148 (L) 07/20/2020    Imaging Studies: No results found.  Assessment and Plan:   Tracy Espinoza is a 74 y.o. y/o female here for follow-up of fecal incontinence  Good results with pelvic floor physical therapy and Metamucil  Continue Metamucil  Patient states she is moving  1-1/2 hours away and will establish new PCP and GI there  I have encouraged her to seek pelvic floor physical therapy there as well as necessary  However, since she is still continuing to have loose bowel movements and she would like additional symptom control especially on days she has to be out of the house, I have asked her to use Imodium once every 2 to 3 days as needed, but no more than 2-3 times a day on the days that she uses it to prevent constipation or side effects.  She verbalized understanding.  I have encouraged her to let us know if symptoms change  No alarm symptoms present  Patient has had a recent colonoscopy, no indication for urgent colonoscopy at this time  Dr Vonda Antigua

## 2020-07-30 ENCOUNTER — Other Ambulatory Visit: Payer: Self-pay | Admitting: Family Medicine

## 2020-07-30 DIAGNOSIS — Z1231 Encounter for screening mammogram for malignant neoplasm of breast: Secondary | ICD-10-CM

## 2020-08-02 ENCOUNTER — Ambulatory Visit: Payer: Medicare Other

## 2020-08-05 ENCOUNTER — Ambulatory Visit: Payer: Medicare Other

## 2020-08-09 ENCOUNTER — Ambulatory Visit: Payer: Medicare Other

## 2020-08-11 ENCOUNTER — Ambulatory Visit: Payer: Medicare Other | Admitting: Nurse Practitioner

## 2020-08-11 ENCOUNTER — Ambulatory Visit: Payer: Medicare Other | Admitting: Family Medicine

## 2020-08-12 ENCOUNTER — Ambulatory Visit: Payer: Medicare Other

## 2020-08-12 DIAGNOSIS — N12 Tubulo-interstitial nephritis, not specified as acute or chronic: Secondary | ICD-10-CM | POA: Diagnosis not present

## 2020-08-16 ENCOUNTER — Ambulatory Visit: Payer: Medicare Other

## 2020-08-19 ENCOUNTER — Ambulatory Visit: Payer: Medicare Other

## 2020-08-19 ENCOUNTER — Other Ambulatory Visit: Payer: Self-pay | Admitting: Family Medicine

## 2020-08-23 ENCOUNTER — Ambulatory Visit: Payer: Medicare Other

## 2020-08-27 ENCOUNTER — Other Ambulatory Visit: Payer: Self-pay | Admitting: Family Medicine

## 2020-08-27 DIAGNOSIS — F419 Anxiety disorder, unspecified: Secondary | ICD-10-CM

## 2020-08-27 NOTE — Telephone Encounter (Signed)
Requested medication (s) are due for refill today: no  Requested medication (s) are on the active medication list: yes   Last refill:  03/17/2020  Future visit scheduled:no  Notes to clinic:  this refill cannot be delegated    Requested Prescriptions  Pending Prescriptions Disp Refills   LORazepam (ATIVAN) 0.5 MG tablet [Pharmacy Med Name: LORAZEPAM  0.5MG   TAB] 90 tablet     Sig: TAKE 1 TABLET BY MOUTH  DAILY . TAKE ONLY FOR BAD  DAYS      Not Delegated - Psychiatry:  Anxiolytics/Hypnotics Failed - 08/27/2020  8:08 AM      Failed - This refill cannot be delegated      Failed - Urine Drug Screen completed in last 360 days      Passed - Valid encounter within last 6 months    Recent Outpatient Visits           1 month ago Morbid obesity ([place])   [place], Henrine Screws T, NP   2 months ago [place], [place], DO   3 months ago Leukocytes in urine   [place], NP   5 months ago [place], [place]   6 months ago Diarrhea, unspecified type   [facility], Lilia Argue, [place]       Future Appointments             In 4 months Stoioff, Ronda Fairly, MD [place]   In 8 months  [facility], [place]

## 2020-08-30 ENCOUNTER — Ambulatory Visit: Payer: Medicare Other

## 2020-08-30 NOTE — Telephone Encounter (Signed)
Patient is requesting a refill

## 2020-09-02 ENCOUNTER — Ambulatory Visit: Payer: Medicare Other

## 2020-09-06 ENCOUNTER — Ambulatory Visit: Payer: Medicare Other

## 2020-09-07 ENCOUNTER — Ambulatory Visit: Payer: Medicare Other

## 2020-09-09 ENCOUNTER — Ambulatory Visit: Payer: Medicare Other

## 2020-09-13 ENCOUNTER — Ambulatory Visit: Payer: Medicare Other

## 2020-09-16 ENCOUNTER — Ambulatory Visit: Payer: Medicare Other

## 2020-09-20 ENCOUNTER — Ambulatory Visit: Payer: Medicare Other

## 2020-09-23 ENCOUNTER — Ambulatory Visit: Payer: Medicare Other

## 2020-09-27 ENCOUNTER — Ambulatory Visit: Payer: Medicare Other

## 2020-09-28 IMAGING — CR DG ABDOMEN 1V
2 series · 3 of 3 positions shown · non-contrast
Comparison: CT from 06/17/2020

CLINICAL DATA: Nausea and vomiting, history of pyelonephritis

EXAM:
ABDOMEN - 1 VIEW

[abdomen kub (1 of 2)]
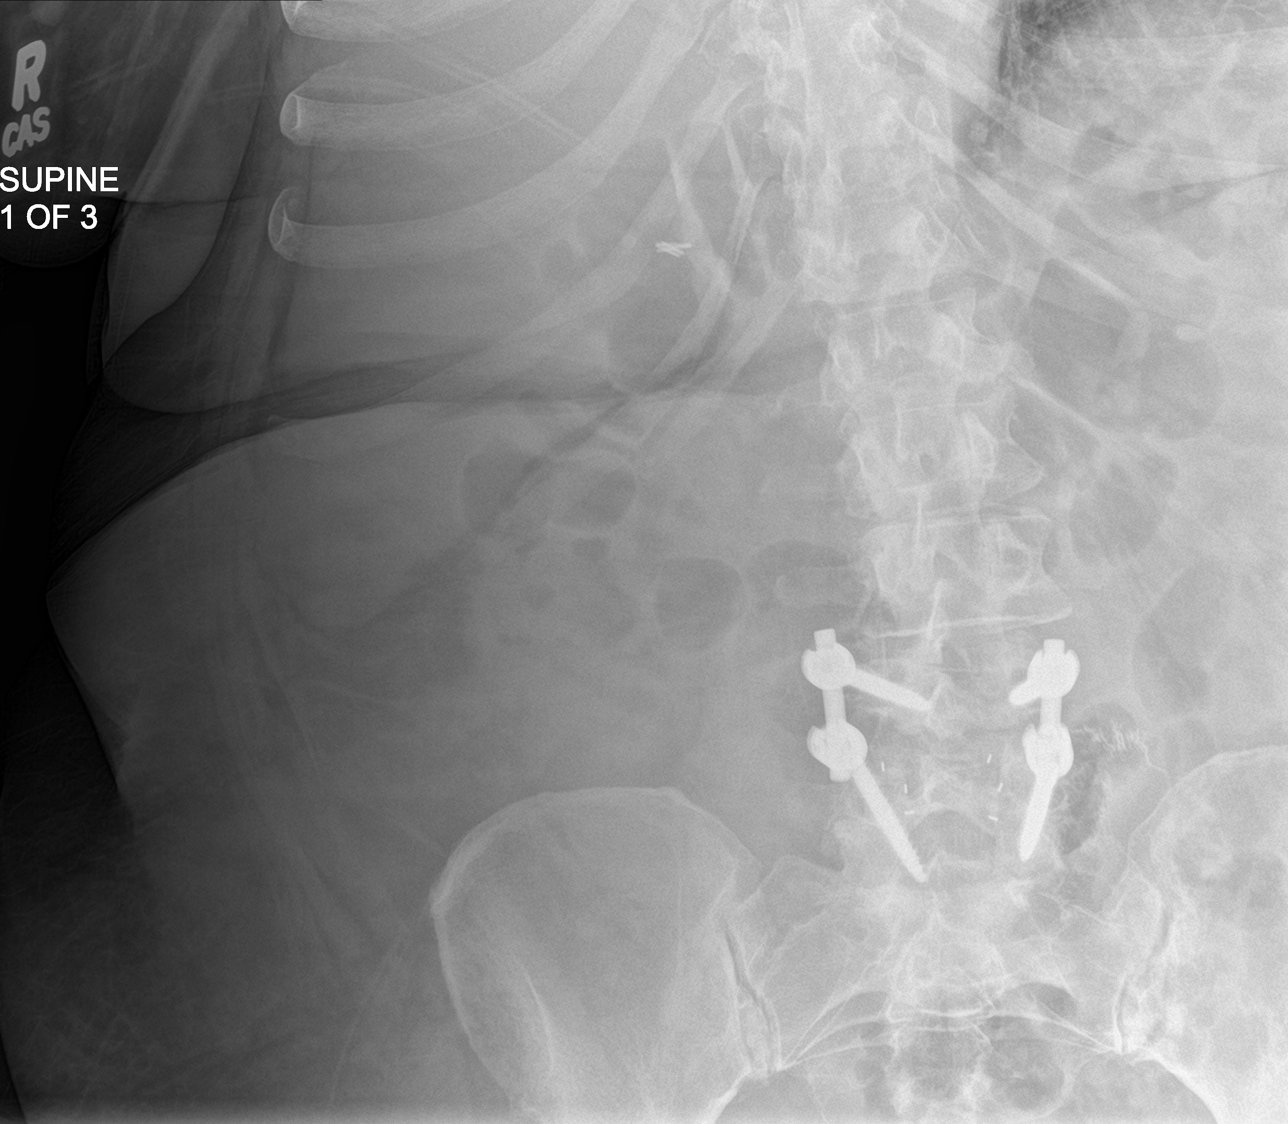

[Series 2: abdomen kub · 0.14mm/px · 2 of 2 slices shown (2 of 2)]
[im 1/2]
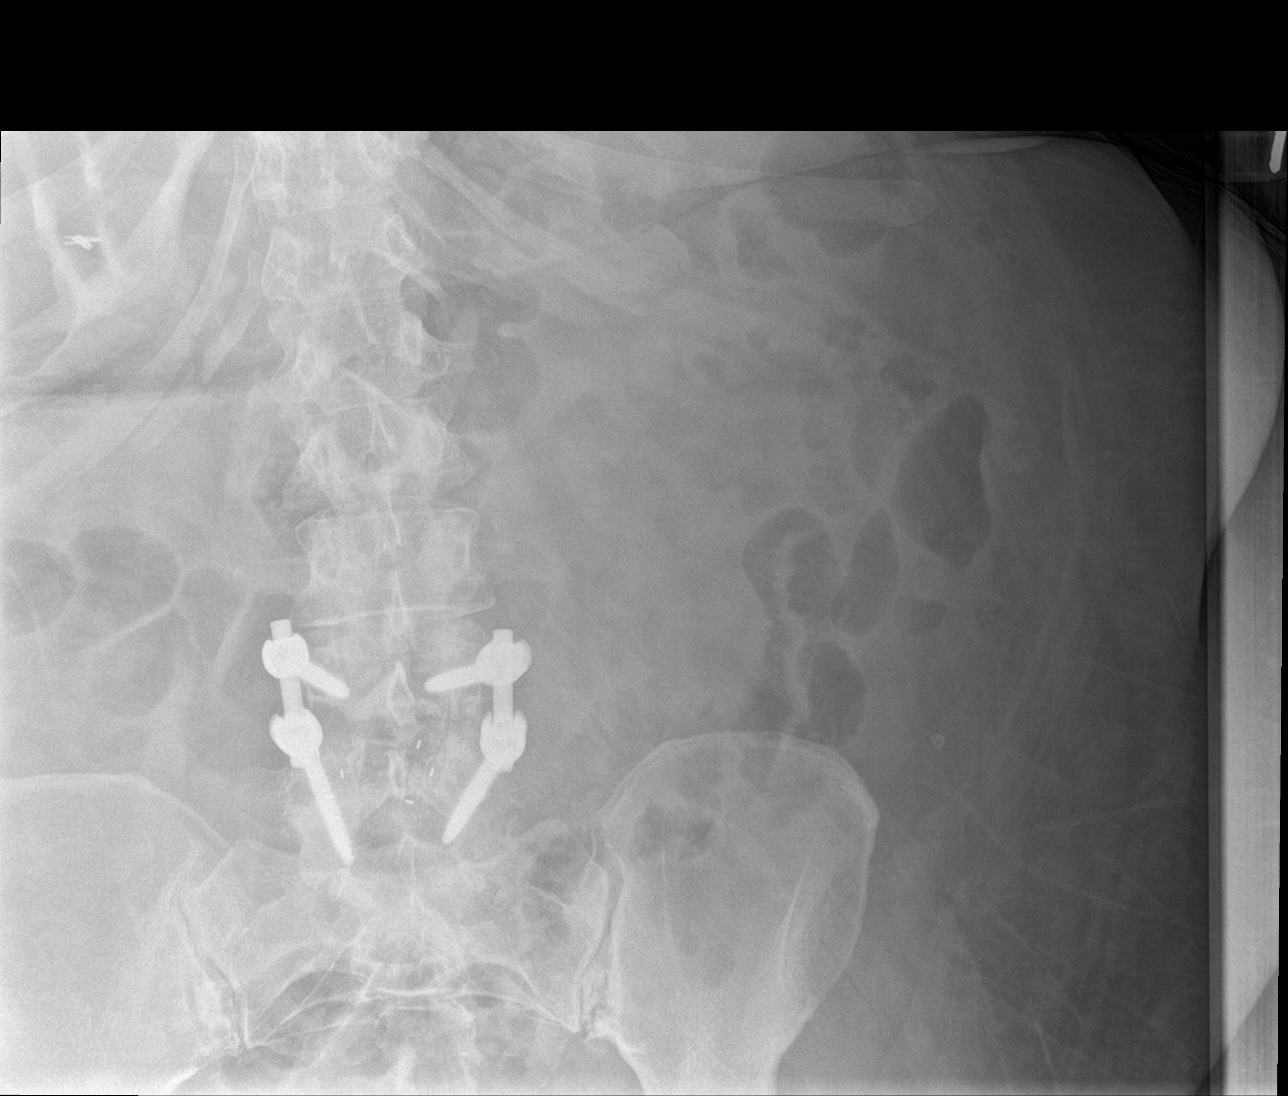
[im 2/2]
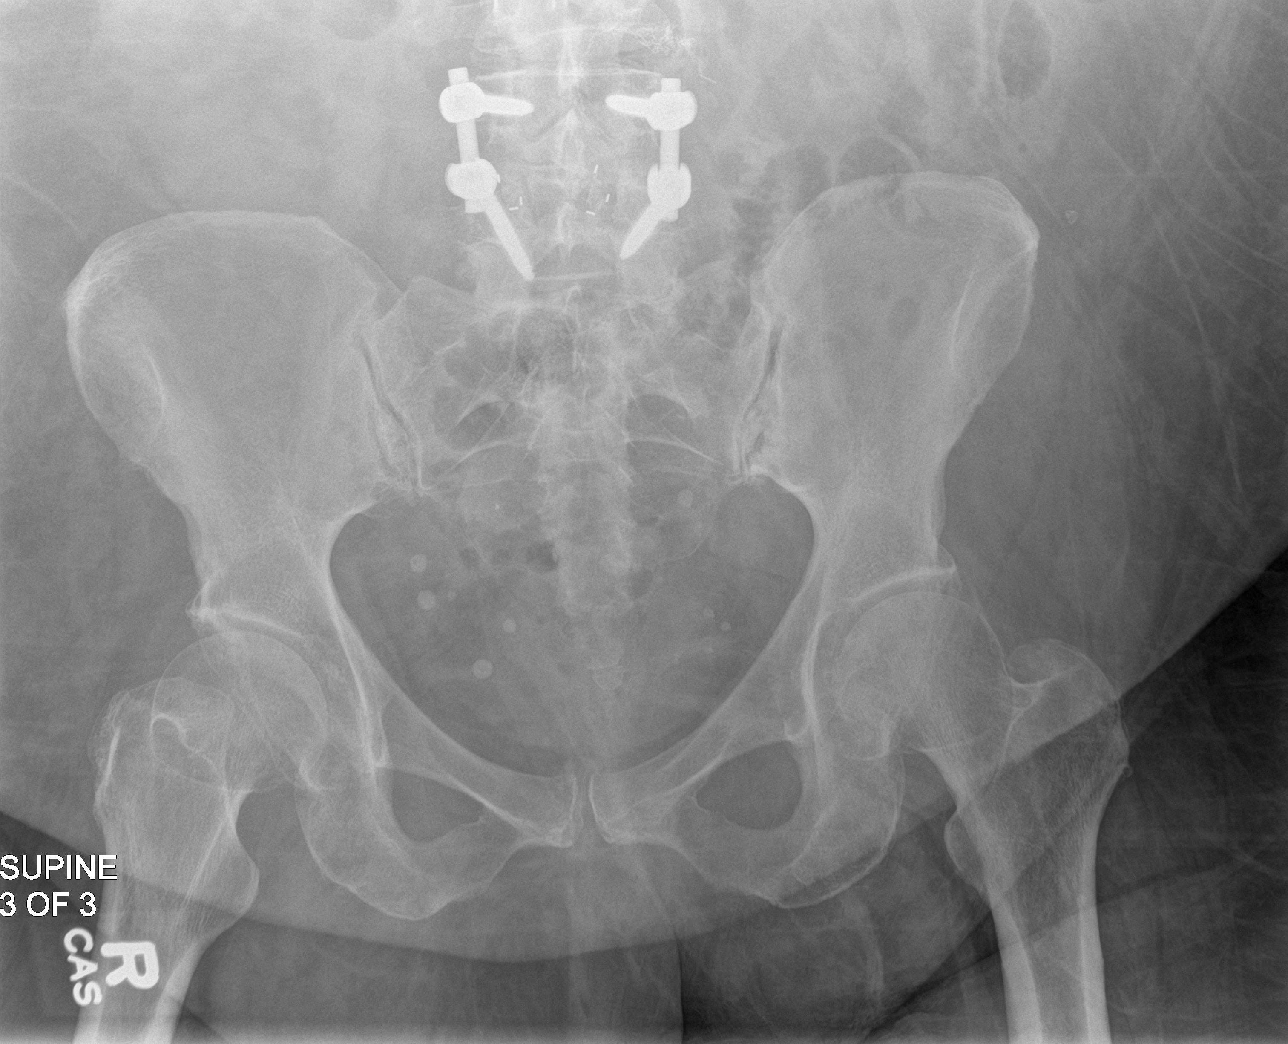

[3 of 3 positions shown; findings below may reference images not displayed]

FINDINGS: Scattered large and small bowel gas is noted. No obstructive changes
are seen. No free air is noted. Postsurgical changes are noted in
the lower lumbar spine as well as prior cholecystectomy. No soft
tissue abnormality is noted.
IMPRESSION: No acute abnormality noted.

## 2020-09-30 ENCOUNTER — Ambulatory Visit: Payer: Medicare Other

## 2020-11-01 ENCOUNTER — Other Ambulatory Visit: Payer: Self-pay | Admitting: Family Medicine

## 2020-11-01 DIAGNOSIS — I10 Essential (primary) hypertension: Secondary | ICD-10-CM

## 2020-11-01 NOTE — Telephone Encounter (Signed)
Requested medication (s) are due for refill today: doxycyline discontinued 06/17/20, hydrodiuril -historical med.   Requested medication (s) are on the active medication list:  yes , doxycyline is not on medication list  Last refill:  11/05/19 #90 4 refills  Future visit scheduled: yes  in 5 months  Notes to clinic:  historical medication, hydrodiuril. Do you want to prescribe?                          Toprol ER - do you want to give #90 2 refills until next appt?   Requested Prescriptions  Pending Prescriptions Disp Refills   doxycycline (VIBRAMYCIN) 100 MG capsule [Pharmacy Med Name: DOXYCYCLINE HYCLATE 100MG  CAP] 90 capsule 0    Sig: TAKE 1 CAPSULE BY MOUTH AT  BEDTIME      Off-Protocol Failed - 11/01/2020  1:12 PM      Failed - Medication not assigned to a protocol, review manually.      Passed - Valid encounter within last 12 months    Recent Outpatient Visits           3 months ago Morbid obesity ([place])   [place], Henrine Screws T, NP   4 months ago [place], [place], DO   5 months ago Leukocytes in urine   [facility] Eulogio Bear, NP   7 months ago [place], [place], [place]   8 months ago Diarrhea, unspecified type   [facility], Lilia Argue, [place]       Future Appointments             In 2 months [place]io[place], Ronda Fairly, MD [place]   In 5 months  [place], [facility]               metoprolol succinate (TOPROL-XL) 50 MG 24 hr tablet [Pharmacy Med Name: Metoprolol Succinate ER 50 MG Oral Tablet Extended Release 24 Hour] 90 tablet 3    Sig: TAKE 1 TABLET BY MOUTH  DAILY      Cardiovascular:  Beta Blockers Passed - 11/01/2020  1:12 PM      Passed - Last BP in normal range    BP Readings from Last 1 Encounters:  07/28/20 131/71          Passed - Last Heart Rate in normal range    Pulse Readings from  Last 1 Encounters:  07/28/20 77          Passed - Valid encounter within last 6 months    Recent Outpatient Visits           3 months ago Morbid obesity ([place])   [place], Henrine Screws T, NP   4 months ago [place], [place], DO   5 months ago Leukocytes in urine   [facility] Eulogio Bear, NP   7 months ago [place], [place]   8 months ago Diarrhea, unspecified type   [facility], Lilia Argue, [place]       Future Appointments             In 2 months Stoioff, Ronda Fairly, MD [place]   In 5 months  [facility], [place]  hydrochlorothiazide (HYDRODIURIL) 25 MG tablet [Pharmacy Med Name: hydroCHLOROthiazide 25 MG Oral Tablet] 90 tablet 3    Sig: TAKE 1 TABLET BY MOUTH  DAILY      Cardiovascular: Diuretics - Thiazide Passed - 11/01/2020  1:12 PM      Passed - Ca in normal range and within 360 days    Calcium  Date Value Ref Range Status  07/20/2020 10.1 8.7 - 10.3 mg/dL Final   Calcium, Total  Date Value Ref Range Status  10/23/2012 9.8 8.5 - 10.1 mg/dL Final          Passed - Cr in normal range and within 360 days    Creatinine  Date Value Ref Range Status  10/23/2012 0.81 0.60 - 1.30 mg/dL Final   Creatinine, Ser  Date Value Ref Range Status  07/20/2020 0.82 0.57 - 1.00 mg/dL Final          Passed - K in normal range and within 360 days    Potassium  Date Value Ref Range Status  07/20/2020 4.1 3.5 - 5.2 mmol/L Final  10/23/2012 3.9 3.5 - 5.1 mmol/L Final          Passed - Na in normal range and within 360 days    Sodium  Date Value Ref Range Status  07/20/2020 142 134 - 144 mmol/L Final  10/23/2012 138 136 - 145 mmol/L Final          Passed - Last BP in normal range    BP Readings from Last 1 Encounters:  07/28/20 131/71          Passed - Valid encounter within  last 6 months    Recent Outpatient Visits           3 months ago Morbid obesity ([place])   [place], Henrine Screws T, NP   4 months ago [place], [place], DO   5 months ago Leukocytes in urine   [facility] Eulogio Bear, NP   7 months ago [place], [place], [place]   8 months ago Diarrhea, unspecified type   [facility], Lilia Argue, [place]       Future Appointments             In 2 months Stoioff, Ronda Fairly, MD [place]   In 5 months  [facility], [place]

## 2020-11-02 NOTE — Telephone Encounter (Signed)
Refill of Doxycycline not appropriate.  Patient can make an appointment to discuss this.  A better option would be for patient to discuss this medication with her Urologist.

## 2020-11-21 DIAGNOSIS — N3000 Acute cystitis without hematuria: Secondary | ICD-10-CM | POA: Diagnosis not present

## 2020-11-29 DIAGNOSIS — R159 Full incontinence of feces: Secondary | ICD-10-CM | POA: Diagnosis not present

## 2020-11-29 DIAGNOSIS — Z7689 Persons encountering health services in other specified circumstances: Secondary | ICD-10-CM | POA: Diagnosis not present

## 2020-11-29 DIAGNOSIS — E7800 Pure hypercholesterolemia, unspecified: Secondary | ICD-10-CM | POA: Diagnosis not present

## 2020-11-29 DIAGNOSIS — Z6841 Body Mass Index (BMI) 40.0 and over, adult: Secondary | ICD-10-CM | POA: Diagnosis not present

## 2020-11-29 DIAGNOSIS — R918 Other nonspecific abnormal finding of lung field: Secondary | ICD-10-CM | POA: Diagnosis not present

## 2020-11-29 DIAGNOSIS — K529 Noninfective gastroenteritis and colitis, unspecified: Secondary | ICD-10-CM | POA: Diagnosis not present

## 2020-11-29 DIAGNOSIS — Z131 Encounter for screening for diabetes mellitus: Secondary | ICD-10-CM | POA: Diagnosis not present

## 2020-11-29 DIAGNOSIS — Z79899 Other long term (current) drug therapy: Secondary | ICD-10-CM | POA: Diagnosis not present

## 2020-11-29 DIAGNOSIS — I10 Essential (primary) hypertension: Secondary | ICD-10-CM | POA: Diagnosis not present

## 2020-11-29 DIAGNOSIS — Z780 Asymptomatic menopausal state: Secondary | ICD-10-CM | POA: Diagnosis not present

## 2020-11-29 DIAGNOSIS — R161 Splenomegaly, not elsewhere classified: Secondary | ICD-10-CM | POA: Diagnosis not present

## 2020-11-29 DIAGNOSIS — N390 Urinary tract infection, site not specified: Secondary | ICD-10-CM | POA: Diagnosis not present

## 2020-12-17 ENCOUNTER — Encounter: Payer: Self-pay | Admitting: Urology

## 2020-12-22 DIAGNOSIS — I700 Atherosclerosis of aorta: Secondary | ICD-10-CM | POA: Insufficient documentation

## 2020-12-28 DIAGNOSIS — M48061 Spinal stenosis, lumbar region without neurogenic claudication: Secondary | ICD-10-CM | POA: Diagnosis not present

## 2020-12-28 DIAGNOSIS — G4701 Insomnia due to medical condition: Secondary | ICD-10-CM | POA: Diagnosis not present

## 2020-12-28 DIAGNOSIS — Z6841 Body Mass Index (BMI) 40.0 and over, adult: Secondary | ICD-10-CM | POA: Diagnosis not present

## 2020-12-28 DIAGNOSIS — K529 Noninfective gastroenteritis and colitis, unspecified: Secondary | ICD-10-CM | POA: Diagnosis not present

## 2020-12-28 DIAGNOSIS — F339 Major depressive disorder, recurrent, unspecified: Secondary | ICD-10-CM | POA: Diagnosis not present

## 2020-12-28 DIAGNOSIS — N6452 Nipple discharge: Secondary | ICD-10-CM | POA: Diagnosis not present

## 2020-12-28 DIAGNOSIS — I700 Atherosclerosis of aorta: Secondary | ICD-10-CM | POA: Diagnosis not present

## 2020-12-28 DIAGNOSIS — E669 Obesity, unspecified: Secondary | ICD-10-CM | POA: Diagnosis not present

## 2020-12-28 DIAGNOSIS — G2581 Restless legs syndrome: Secondary | ICD-10-CM | POA: Diagnosis not present

## 2020-12-28 DIAGNOSIS — N390 Urinary tract infection, site not specified: Secondary | ICD-10-CM | POA: Diagnosis not present

## 2020-12-28 DIAGNOSIS — I10 Essential (primary) hypertension: Secondary | ICD-10-CM | POA: Diagnosis not present

## 2020-12-28 DIAGNOSIS — A4902 Methicillin resistant Staphylococcus aureus infection, unspecified site: Secondary | ICD-10-CM | POA: Diagnosis not present

## 2020-12-29 ENCOUNTER — Other Ambulatory Visit: Payer: Self-pay | Admitting: Family Medicine

## 2020-12-29 ENCOUNTER — Other Ambulatory Visit: Payer: Self-pay | Admitting: Nurse Practitioner

## 2020-12-29 NOTE — Telephone Encounter (Signed)
Patient called, home number is a hospital number, cell phone is disconnected. No numbers on DPR. Appointment needed to establish with new provider.

## 2021-01-02 ENCOUNTER — Other Ambulatory Visit: Payer: Self-pay | Admitting: Nurse Practitioner

## 2021-01-02 NOTE — Telephone Encounter (Signed)
Requested Prescriptions  Pending Prescriptions Disp Refills  . gabapentin (NEURONTIN) 400 MG capsule [Pharmacy Med Name: Gabapentin 400 MG Oral Capsule] 360 capsule 3    Sig: TAKE UP TO 4 CAPSULES BY  MOUTH DAILY AS NEEDED     Neurology: Anticonvulsants - gabapentin Passed - 01/02/2021  4:32 AM      Passed - Valid encounter within last 12 months    Recent Outpatient Visits          5 months ago Morbid obesity [facility])   [place], Henrine Screws T, NP   6 months ago [place], [place], DO   7 months ago Leukocytes in urine   [facility] Eulogio Bear, NP   9 months ago [place], [place]   10 months ago Diarrhea, unspecified type   [facility], Lilia Argue, [place]      Future Appointments            In 3 months [facility], [place]

## 2021-01-13 ENCOUNTER — Ambulatory Visit: Payer: Medicare Other | Admitting: Urology

## 2021-01-13 DIAGNOSIS — N6452 Nipple discharge: Secondary | ICD-10-CM | POA: Diagnosis not present

## 2021-01-14 ENCOUNTER — Ambulatory Visit: Payer: Self-pay | Admitting: Urology

## 2021-02-11 ENCOUNTER — Other Ambulatory Visit: Payer: Self-pay | Admitting: Nurse Practitioner

## 2021-04-25 ENCOUNTER — Ambulatory Visit: Payer: Medicare Other

## 2021-04-25 ENCOUNTER — Telehealth: Payer: No Typology Code available for payment source

## 2021-04-25 NOTE — Telephone Encounter (Signed)
This nurse attempted to call patient three times for telephonic AWV. Called at [phone], 1300, and [phone]. Number has no voicemail to leave a message.

## 2021-04-29 ENCOUNTER — Other Ambulatory Visit: Payer: Self-pay | Admitting: Nurse Practitioner

## 2021-04-30 NOTE — Telephone Encounter (Signed)
Requested medications are due for refill today yes (mail order)  Requested medications are on the active medication list yes  Last refill 6/8 (mail order)  Last visit 07/2020  Future visit scheduled no  Notes to clinic Has already had a curtesy refill, no show for his appt 7/25 and there is no upcoming appointment scheduled.

## 2021-05-02 NOTE — Telephone Encounter (Signed)
Please call and schedule patient an appointment for follow up and refill. Then route to provider for refill until appointment.

## 2021-05-03 NOTE — Telephone Encounter (Signed)
Attempted to reach patient to schedule appointment.  No answer and other number out of service.  Left message with patients daughter to have patient contact us.

## 2021-06-02 ENCOUNTER — Encounter: Payer: Medicare Other | Admitting: Dermatology

## 2021-10-30 ENCOUNTER — Other Ambulatory Visit: Payer: Self-pay | Admitting: Nurse Practitioner

## 2021-10-30 DIAGNOSIS — I10 Essential (primary) hypertension: Secondary | ICD-10-CM

## 2021-10-31 NOTE — Telephone Encounter (Signed)
Requested medication (s) are due for refill today: last refill 09/05/21  Requested medication (s) are on the active medication list: yes  Last refill:  11/02/20 #90 3 refills  Future visit scheduled: no  Notes to clinic:  over due labs . Last labs 07/20/20 . No valid encounter . Called to schedule appt no answer. Unable to leave message. Do you want to refill?     Requested Prescriptions  Pending Prescriptions Disp Refills   hydrochlorothiazide (HYDRODIURIL) 25 MG tablet [Pharmacy Med Name: hydroCHLOROthiazide 25 MG Oral Tablet] 90 tablet 3    Sig: TAKE 1 TABLET BY MOUTH  DAILY     Cardiovascular: Diuretics - Thiazide Failed - 10/30/2021 10:44 PM      Failed - Ca in normal range and within 360 days    Calcium  Date Value Ref Range Status  07/20/2020 10.1 8.7 - 10.3 mg/dL Final   Calcium, Total  Date Value Ref Range Status  10/23/2012 9.8 8.5 - 10.1 mg/dL Final          Failed - Cr in normal range and within 360 days    Creatinine  Date Value Ref Range Status  10/23/2012 0.81 0.60 - 1.30 mg/dL Final   Creatinine, Ser  Date Value Ref Range Status  07/20/2020 0.82 0.57 - 1.00 mg/dL Final          Failed - K in normal range and within 360 days    Potassium  Date Value Ref Range Status  07/20/2020 4.1 3.5 - 5.2 mmol/L Final  10/23/2012 3.9 3.5 - 5.1 mmol/L Final          Failed - Na in normal range and within 360 days    Sodium  Date Value Ref Range Status  07/20/2020 142 134 - 144 mmol/L Final  10/23/2012 138 136 - 145 mmol/L Final          Failed - Valid encounter within last 6 months    Recent Outpatient Visits           1 year ago Morbid obesity ([place])   [place], Henrine Screws T, NP   1 year ago [place], [place], DO   1 year ago Leukocytes in urine   [facility] Eulogio Bear, NP   1 year ago [place], [place], [place]   1 year ago Diarrhea,  unspecified type   [place], [place], [place]              Passed - Last BP in normal range    BP Readings from Last 1 Encounters:  07/28/20 131/71           metoprolol succinate (TOPROL-XL) 50 MG 24 hr tablet [Pharmacy Med Name: Metoprolol Succinate ER 50 MG Oral Tablet Extended Release 24 Hour] 90 tablet 3    Sig: TAKE 1 TABLET BY MOUTH  DAILY     Cardiovascular:  Beta Blockers Failed - 10/30/2021 10:44 PM      Failed - Valid encounter within last 6 months    Recent Outpatient Visits           1 year ago Morbid obesity [facility])   [place], Jolene T, NP   1 year ago Dysuria   [place], [place], DO   1 year ago Leukocytes in urine   [facility] Eulogio Bear, NP   1 year  ago [place], [place], [place]   1 year ago Diarrhea, unspecified type   [place], [place], [place]              Passed - Last BP in normal range    BP Readings from Last 1 Encounters:  07/28/20 131/71          Passed - Last Heart Rate in normal range    Pulse Readings from Last 1 Encounters:  07/28/20 77

## 2021-10-31 NOTE — Telephone Encounter (Signed)
Called patient to schedule appt . No answer , recording states call can not be completed at this time call back.  Unable to leave message

## 2022-01-13 ENCOUNTER — Other Ambulatory Visit: Payer: Self-pay | Admitting: Internal Medicine

## 2022-01-16 NOTE — Telephone Encounter (Signed)
Requested medication (s) are due for refill today: yes ? ?Requested medication (s) are on the active medication list: yes ? ?Last refill:  01/02/21 #360 with 3 RF ? ?Future visit scheduled: no, last visit and labs 2021. ? ?Notes to clinic:  Failed protocol due to no valid visit within 12  months last seen 2021, please assess. ? ? ? ?  ? ?Requested Prescriptions  ?Pending Prescriptions Disp Refills  ? gabapentin (NEURONTIN) 400 MG capsule [Pharmacy Med Name: Gabapentin 400 MG Oral Capsule] 360 capsule 3  ?  Sig: TAKE UP TO 4 CAPSULES BY  MOUTH DAILY AS NEEDED  ?  ? Neurology: Anticonvulsants - gabapentin Failed - 01/13/2022 10:52 PM  ?  ?  Failed - Cr in normal range and within 360 days  ?  Creatinine  ?Date Value Ref Range Status  ?10/23/2012 0.81 0.60 - 1.30 mg/dL Final  ? ?Creatinine, Ser  ?Date Value Ref Range Status  ?07/20/2020 0.82 0.57 - 1.00 mg/dL Final  ?  ?  ?  ?  Failed - Completed PHQ-2 or PHQ-9 in the last 360 days  ?  ?  Failed - Valid encounter within last 12 months  ?  Recent Outpatient Visits   ? ?      ? 1 year ago Morbid obesity ([place])  ? [place], Henrine Screws T, NP  ? 1 year ago Dysuria  ? [place], [place] P, DO  ? 1 year ago Leukocytes in urine  ? [facility] Eulogio Bear, NP  ? 1 year ago Anxiety  ? [place], [place]  ? 1 year ago Diarrhea, unspecified type  ? [facility] [place], [place]  ? ?  ?  ? ? ?  ?  ?  ? ? ?

## 2022-04-28 ENCOUNTER — Ambulatory Visit: Payer: Medicare Other
